# Patient Record
Sex: Female | Born: 1975 | Race: White | Hispanic: No | Marital: Single | Smoking: Never smoker
Health system: Southern US, Community
[De-identification: ages and names within clinical notes are randomized; demographics above are authoritative.]

## PROBLEM LIST (undated history)

## (undated) DIAGNOSIS — C50912 Malignant neoplasm of unspecified site of left female breast: Secondary | ICD-10-CM

## (undated) DIAGNOSIS — Z973 Presence of spectacles and contact lenses: Secondary | ICD-10-CM

## (undated) DIAGNOSIS — R232 Flushing: Secondary | ICD-10-CM

## (undated) DIAGNOSIS — K2970 Gastritis, unspecified, without bleeding: Secondary | ICD-10-CM

## (undated) DIAGNOSIS — Z8041 Family history of malignant neoplasm of ovary: Secondary | ICD-10-CM

## (undated) DIAGNOSIS — Z9221 Personal history of antineoplastic chemotherapy: Secondary | ICD-10-CM

## (undated) DIAGNOSIS — N809 Endometriosis, unspecified: Secondary | ICD-10-CM

## (undated) DIAGNOSIS — R112 Nausea with vomiting, unspecified: Secondary | ICD-10-CM

## (undated) DIAGNOSIS — T7840XA Allergy, unspecified, initial encounter: Secondary | ICD-10-CM

## (undated) DIAGNOSIS — Z800 Family history of malignant neoplasm of digestive organs: Secondary | ICD-10-CM

## (undated) DIAGNOSIS — Z8042 Family history of malignant neoplasm of prostate: Secondary | ICD-10-CM

## (undated) DIAGNOSIS — F32A Depression, unspecified: Secondary | ICD-10-CM

## (undated) DIAGNOSIS — Z803 Family history of malignant neoplasm of breast: Secondary | ICD-10-CM

## (undated) DIAGNOSIS — F329 Major depressive disorder, single episode, unspecified: Secondary | ICD-10-CM

## (undated) DIAGNOSIS — F419 Anxiety disorder, unspecified: Secondary | ICD-10-CM

## (undated) DIAGNOSIS — G43909 Migraine, unspecified, not intractable, without status migrainosus: Secondary | ICD-10-CM

## (undated) DIAGNOSIS — Z9889 Other specified postprocedural states: Secondary | ICD-10-CM

## (undated) HISTORY — PX: OVARIAN CYST REMOVAL: SHX89

## (undated) HISTORY — DX: Family history of malignant neoplasm of breast: Z80.3

## (undated) HISTORY — DX: Gastritis, unspecified, without bleeding: K29.70

## (undated) HISTORY — PX: NASAL POLYP EXCISION: SHX2068

## (undated) HISTORY — DX: Family history of malignant neoplasm of digestive organs: Z80.0

## (undated) HISTORY — PX: WISDOM TOOTH EXTRACTION: SHX21

## (undated) HISTORY — DX: Family history of malignant neoplasm of ovary: Z80.41

## (undated) HISTORY — DX: Family history of malignant neoplasm of prostate: Z80.42

## (undated) HISTORY — PX: COLONOSCOPY: SHX174

## (undated) HISTORY — DX: Allergy, unspecified, initial encounter: T78.40XA

---
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
## 2017-12-23 DIAGNOSIS — C50912 Malignant neoplasm of unspecified site of left female breast: Secondary | ICD-10-CM

## 2017-12-23 HISTORY — DX: Malignant neoplasm of unspecified site of left female breast: C50.912

## 2018-01-01 ENCOUNTER — Other Ambulatory Visit: Payer: Self-pay | Admitting: Physician Assistant

## 2018-01-01 DIAGNOSIS — N63 Unspecified lump in unspecified breast: Secondary | ICD-10-CM

## 2018-01-02 ENCOUNTER — Ambulatory Visit
Admission: RE | Admit: 2018-01-02 | Discharge: 2018-01-02 | Disposition: A | Discharge status: Home / Self Care | Payer: Commercial Managed Care - PPO | Source: Ambulatory Visit | Attending: Physician Assistant | Admitting: Physician Assistant

## 2018-01-02 DIAGNOSIS — N63 Unspecified lump in unspecified breast: Secondary | ICD-10-CM

## 2018-01-03 ENCOUNTER — Telehealth: Payer: Self-pay | Admitting: *Deleted

## 2018-01-03 NOTE — Telephone Encounter (Signed)
Left vm for pt to return call to discuss appt with Dr. Lindi Adie on 01/04/18 at 9:15. Contact information provided.

## 2018-01-03 NOTE — Telephone Encounter (Signed)
Confirmed appt with Dr. Lindi Adie on 01/04/18 at 9:15.

## 2018-01-04 ENCOUNTER — Other Ambulatory Visit: Payer: Self-pay | Admitting: *Deleted

## 2018-01-04 ENCOUNTER — Inpatient Hospital Stay: Payer: Commercial Managed Care - PPO | Attending: Hematology and Oncology | Admitting: Hematology and Oncology

## 2018-01-04 ENCOUNTER — Encounter: Payer: Self-pay | Admitting: *Deleted

## 2018-01-04 DIAGNOSIS — Z170 Estrogen receptor positive status [ER+]: Secondary | ICD-10-CM | POA: Diagnosis not present

## 2018-01-04 DIAGNOSIS — C50412 Malignant neoplasm of upper-outer quadrant of left female breast: Secondary | ICD-10-CM | POA: Insufficient documentation

## 2018-01-04 MED ORDER — LORAZEPAM 0.5 MG PO TABS: 0.5000 mg | ORAL_TABLET | Freq: Three times a day (TID) | ORAL | 1 refills | Status: DC | PRN

## 2018-01-04 NOTE — Assessment & Plan Note (Signed)
01/02/2018: Palpable lump left breast since December 2018, mammogram: Left breast UOQ 2.3 cm mass, axilla has several enlarged lymph nodes; 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  Pathology and radiology counseling: Discussed with the patient, the details of pathology including the type of breast cancer,the clinical staging, the significance of ER, PR and HER-2/neu receptors and the implications for treatment. After reviewing the pathology in detail, we proceeded to discuss the different treatment options between surgery, radiation, chemotherapy, antiestrogen therapies.  Recommendation: Neoadjuvant chemotherapy Followed by surgery with AXLND versus TAD Followed by radiation Followed by antiestrogen therapy  Awaiting on the HER-2 results before finalizing the treatment plan. Chemotherapy could be either AC followed by Taxol versus TCHP. Plan: 1.  Breast MRI  2. CTCAP and bone scan 3.  Echocardiogram 4.  Chemo class 5.  Port placement   Start chemotherapy on 01/16/2018

## 2018-01-04 NOTE — Progress Notes (Signed)
[facility] CONSULT NOTE  Patient Care Team: Blair Heys, PA-C as PCP - General (Physician Assistant)  CHIEF COMPLAINTS/PURPOSE OF CONSULTATION:  Newly diagnosed breast cancer  HISTORY OF PRESENTING ILLNESS:  Kristi [name] 42 y.o. female is here because of recent diagnosis of left breast cancer.  Patient felt a lump in the left breast on December 2018.  It was felt to be benign.  She continued to notice a slow increase in size and recently it started increasing much more significantly.  She then [facility] which revealed 2.3 cm mass in the left breast.  There were several axillary lymph nodes noted in 1 of the lymph muscles biopsy-proven to be positive for invasive ductal carcinoma.  The primary tumor in the lymph nodes were ER PR positive HER-2 is equivocal FISH is pending.  Ki-67 60%.  She was sent to [place] for discussion regarding treatment options. Patient is premenopausal.  She has had problems with heavy bleeding previously.  She had been on birth control pills.  She was also on Lupron at one point.  I reviewed her records extensively and collaborated the history with the patient.  SUMMARY OF ONCOLOGIC HISTORY:   Malignant neoplasm of upper-outer quadrant of left breast in female, estrogen receptor positive ([facility])   01/02/2018 Initial Diagnosis    Palpable lump left breast, mammogram: Left breast UOQ 2.3 cm mass, axilla has several enlarged lymph nodes; 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      01/04/2018 Cancer Staging    Staging form: Breast, AJCC 8th Edition - Clinical: Stage IIB (cT2, cN1, cM0, G3, ER+, PR+, HER2: Equivocal) - Signed by Nicholas Lose, MD on 01/04/2018        MEDICAL HISTORY:  No prior health issues SURGICAL HISTORY: No prior surgeries SOCIAL HISTORY: Denies any tobacco or alcohol or recreational drug use.  She  works as an Solicitor at [employer]. FAMILY HISTORY: Father prostate cancer, ovarian cancer in her aunt on mother side, pancreatic cancer and another aunt with breast cancer  ALLERGIES:  has no allergies on file.  MEDICATIONS: Does not take any medications REVIEW OF SYSTEMS:   Constitutional: Denies fevers, chills or abnormal night sweats Eyes: Denies blurriness of vision, double vision or watery eyes Ears, nose, mouth, throat, and face: Denies mucositis or sore throat Respiratory: Denies cough, dyspnea or wheezes Cardiovascular: Denies palpitation, chest discomfort or lower extremity swelling Gastrointestinal:  Denies nausea, heartburn or change in bowel habits Skin: Denies abnormal skin rashes Lymphatics: Denies new lymphadenopathy or easy bruising Neurological:Denies numbness, tingling or new weaknesses Behavioral/Psych: Mood is stable, no new changes  Breast:  Palpable lump in the left breast with tenderness from recent biopsy All other systems were reviewed with the patient and are negative.  PHYSICAL EXAMINATION: ECOG PERFORMANCE STATUS: 1 - Symptomatic but completely ambulatory  Vitals:   01/04/18 0859  BP: (!) 142/88  Pulse: (!) 108  Resp: 20  Temp: 98.3 F (36.8 C)  SpO2: 100%   Filed Weights   01/04/18 0859  Weight: 148 lb 8 oz (67.4 kg)    GENERAL:alert, no distress and comfortable SKIN: skin color, texture, turgor are normal, no rashes or significant lesions EYES: normal, conjunctiva are pink and non-injected, sclera clear OROPHARYNX:no exudate, no erythema and lips, buccal mucosa, and tongue normal  NECK: supple, thyroid normal size, non-tender, without nodularity LYMPH:  no palpable lymphadenopathy in the cervical, axillary or inguinal LUNGS: clear to auscultation and percussion with normal breathing effort HEART: regular rate & rhythm and no murmurs and no lower extremity edema ABDOMEN:abdomen soft, non-tender and normal bowel  sounds Musculoskeletal:no cyanosis of digits and no clubbing  PSYCH: alert & oriented x 3 with fluent speech NEURO: no focal motor/sensory deficits BREAST bruising related to recent biopsy.  Patient feels that it is very tender to touch. No palpable axillary or supraclavicular lymphadenopathy (exam performed in the presence of a chaperone)   RADIOGRAPHIC STUDIES: I have personally reviewed the radiological reports and agreed with the findings in the report.  ASSESSMENT AND PLAN:  Malignant neoplasm of upper-outer quadrant of left breast in female, estrogen receptor positive ([place]) 01/02/2018: Palpable lump left breast since December 2018, mammogram: Left breast UOQ 2.3 cm mass, axilla has several enlarged lymph nodes; 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  Pathology and radiology counseling: Discussed with the patient, the details of pathology including the type of breast cancer,the clinical staging, the significance of ER, PR and HER-2/neu receptors and the implications for treatment. After reviewing the pathology in detail, we proceeded to discuss the different treatment options between surgery, radiation, chemotherapy, antiestrogen therapies.  Recommendation: Neoadjuvant chemotherapy Followed by surgery with AXLND versus TAD Followed by radiation Followed by antiestrogen therapy  Awaiting on the HER-2 results before finalizing the treatment plan. Chemotherapy could be either AC followed by Taxol versus TCHP. Plan: 1.  Breast MRI  2. CTCAP and bone scan 3.  Echocardiogram 4.  Chemo class 5.  Port placement  6.  Genetics consultation   Start chemotherapy on 01/16/2018  All questions were answered. The patient knows to call the clinic with any problems, questions or concerns.    Harriette Ohara, MD 01/04/18

## 2018-01-05 ENCOUNTER — Other Ambulatory Visit: Payer: Self-pay | Admitting: *Deleted

## 2018-01-05 DIAGNOSIS — Z170 Estrogen receptor positive status [ER+]: Secondary | ICD-10-CM

## 2018-01-05 DIAGNOSIS — C50412 Malignant neoplasm of upper-outer quadrant of left female breast: Secondary | ICD-10-CM

## 2018-01-08 ENCOUNTER — Other Ambulatory Visit: Payer: Self-pay | Admitting: Hematology and Oncology

## 2018-01-08 ENCOUNTER — Encounter (HOSPITAL_BASED_OUTPATIENT_CLINIC_OR_DEPARTMENT_OTHER): Payer: Self-pay | Admitting: *Deleted

## 2018-01-08 ENCOUNTER — Other Ambulatory Visit: Payer: Self-pay | Admitting: General Surgery

## 2018-01-08 ENCOUNTER — Other Ambulatory Visit: Payer: Self-pay

## 2018-01-08 NOTE — Pre-Procedure Instructions (Signed)
To come to pick up Ensure pre-surgery drink 10 oz. - to drink by 0600 DOS.

## 2018-01-09 ENCOUNTER — Encounter: Payer: Self-pay | Admitting: Genetic Counselor

## 2018-01-09 ENCOUNTER — Inpatient Hospital Stay: Payer: Commercial Managed Care - PPO

## 2018-01-09 ENCOUNTER — Encounter: Payer: Self-pay | Admitting: *Deleted

## 2018-01-09 ENCOUNTER — Inpatient Hospital Stay (HOSPITAL_BASED_OUTPATIENT_CLINIC_OR_DEPARTMENT_OTHER): Payer: Commercial Managed Care - PPO | Admitting: Genetic Counselor

## 2018-01-09 ENCOUNTER — Ambulatory Visit (HOSPITAL_COMMUNITY)
Admission: RE | Admit: 2018-01-09 | Discharge: 2018-01-09 | Disposition: A | Discharge status: Home / Self Care | Payer: Commercial Managed Care - PPO | Source: Ambulatory Visit | Attending: Hematology and Oncology | Admitting: Hematology and Oncology

## 2018-01-09 DIAGNOSIS — Z8041 Family history of malignant neoplasm of ovary: Secondary | ICD-10-CM | POA: Insufficient documentation

## 2018-01-09 DIAGNOSIS — Z170 Estrogen receptor positive status [ER+]: Secondary | ICD-10-CM

## 2018-01-09 DIAGNOSIS — Z800 Family history of malignant neoplasm of digestive organs: Secondary | ICD-10-CM | POA: Diagnosis not present

## 2018-01-09 DIAGNOSIS — C50412 Malignant neoplasm of upper-outer quadrant of left female breast: Secondary | ICD-10-CM | POA: Diagnosis not present

## 2018-01-09 DIAGNOSIS — Z803 Family history of malignant neoplasm of breast: Secondary | ICD-10-CM | POA: Insufficient documentation

## 2018-01-09 DIAGNOSIS — Z8042 Family history of malignant neoplasm of prostate: Secondary | ICD-10-CM | POA: Insufficient documentation

## 2018-01-09 LAB — CMP (CANCER CENTER ONLY)
ALK PHOS: 50 U/L (ref 40–150)
ALT: 11 U/L (ref 0–55)
AST: 14 U/L (ref 5–34)
Albumin: 4.2 g/dL (ref 3.5–5.0)
Anion gap: 10 (ref 3–11)
BUN: 10 mg/dL (ref 7–26)
CALCIUM: 9.3 mg/dL (ref 8.4–10.4)
CHLORIDE: 105 mmol/L (ref 98–109)
CO2: 25 mmol/L (ref 22–29)
CREATININE: 0.81 mg/dL (ref 0.60–1.10)
GFR, Est AFR Am: >60 mL/min (ref >60)
GFR, Estimated: >60 mL/min (ref >60)
Glucose, Bld: 82 mg/dL (ref 70–140)
Potassium: 4.1 mmol/L (ref 3.5–5.1)
SODIUM: 140 mmol/L (ref 136–145)
Total Bilirubin: 0.4 mg/dL (ref 0.2–1.2)
Total Protein: 7.5 g/dL (ref 6.4–8.3)

## 2018-01-09 LAB — CBC WITH DIFFERENTIAL (CANCER CENTER ONLY)
BASOS PCT: 0 %
Basophils Absolute: 0 10*3/uL (ref 0.0–0.1)
EOS PCT: 1 %
Eosinophils Absolute: 0.1 10*3/uL (ref 0.0–0.5)
HCT: 40.8 % (ref 34.8–46.6)
Hemoglobin: 13.5 g/dL (ref 11.6–15.9)
LYMPHS ABS: 1.3 10*3/uL (ref 0.9–3.3)
Lymphocytes Relative: 16 %
MCH: 29.7 pg (ref 25.1–34.0)
MCHC: 33.1 g/dL (ref 31.5–36.0)
MCV: 89.7 fL (ref 79.5–101.0)
MONO ABS: 0.2 10*3/uL (ref 0.1–0.9)
MONOS PCT: 3 %
Neutro Abs: 6.5 10*3/uL (ref 1.5–6.5)
Neutrophils Relative %: 80 %
PLATELETS: 402 10*3/uL — AB (ref 145–400)
RBC: 4.55 MIL/uL (ref 3.70–5.45)
RDW: 13.1 % (ref 11.2–14.5)
WBC Count: 8 10*3/uL (ref 3.9–10.3)

## 2018-01-09 NOTE — Progress Notes (Signed)
Pt here to pick up Ensure drink for DOS 01/15/18 Instructions review, verbalized understanding. To finish drink by 0931 DOS

## 2018-01-09 NOTE — Progress Notes (Signed)
  Echocardiogram 2D Echocardiogram has been performed.  Shamariah Shewmake T Aisea Bouldin 01/09/2018, 11:40 AM

## 2018-01-09 NOTE — Progress Notes (Signed)
REFERRING PROVIDER: Rolm Bookbinder, MD [address], [place] [address]  PRIMARY PROVIDER:  Blair Heys, PA-C  PRIMARY REASON FOR VISIT:  1. Malignant neoplasm of upper-outer quadrant of left breast in female, estrogen receptor positive ([facility])   2. Family history of breast cancer   3. Family history of prostate cancer   4. Family history of ovarian cancer   5. Family history of pancreatic cancer      HISTORY OF PRESENT ILLNESS:   Kristi [name], a 42 y.o. female, was seen for a [facility] cancer genetics consultation at the request of Dr. Donne Hazel due to a personal and family history of cancer.  Kristi [name] presents to clinic today to discuss the possibility of a hereditary predisposition to cancer, genetic testing, and to further clarify her future cancer risks, as well as potential cancer risks for family members.   In June 2019, at the age of 12, Kristi [name] was diagnosed with invasive ductal carcinoma of the left breast.  The tumor is ER+/PR+/Her2 equivocal. This will be treated with chemotherapy, surgery and radiation.      CANCER HISTORY:    Malignant neoplasm of upper-outer quadrant of left breast in female, estrogen receptor positive ([facility])   01/02/2018 Initial Diagnosis    Palpable lump left breast, mammogram: Left breast UOQ 2.3 cm mass, axilla has several enlarged lymph nodes; 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      01/04/2018 Cancer Staging    Staging form: Breast, AJCC 8th Edition - Clinical: Stage IIB (cT2, cN1, cM0, G3, ER+, PR+, HER2: Equivocal) - Signed by Nicholas Lose, MD on 01/04/2018      01/15/2018 -  Chemotherapy    The patient had DOXOrubicin (ADRIAMYCIN) chemo injection 106 mg, 60 mg/m2 = 106 mg, Intravenous,  Once, 0 of 4 cycles palonosetron (ALOXI) injection 0.25 mg, 0.25 mg, Intravenous,  Once, 0 of 4  cycles pegfilgrastim-cbqv (UDENYCA) injection 6 mg, 6 mg, Subcutaneous, Once, 0 of 4 cycles cyclophosphamide (CYTOXAN) 1,060 mg in sodium chloride 0.9 % 250 mL chemo infusion, 600 mg/m2 = 1,060 mg, Intravenous,  Once, 0 of 4 cycles PACLitaxel (TAXOL) 138 mg in sodium chloride 0.9 % 250 mL chemo infusion (</= '80mg'$ /m2), 80 mg/m2 = 138 mg, Intravenous,  Once, 0 of 12 cycles fosaprepitant (EMEND) 150 mg, dexamethasone (DECADRON) 12 mg in sodium chloride 0.9 % 145 mL IVPB, , Intravenous,  Once, 0 of 4 cycles  for chemotherapy treatment.         HORMONAL RISK FACTORS:  Menarche was at age 32.  First live birth at age N/A.  OCP use for approximately 13 years.  Ovaries intact: yes.  Hysterectomy: no.  Menopausal status: premenopausal.  HRT use: 0 years. Colonoscopy: yes; normal. Mammogram within the last year: yes. Number of breast biopsies: 1. Up to date with pelvic exams:  yes. Any excessive radiation exposure in the past:  no  Past Medical History:  Diagnosis Date  . Breast cancer, left ([facility]) 12/2017  . Dental crowns present   . Endometriosis   . Family history of breast cancer   . Family history of ovarian cancer   . Family history of pancreatic cancer   . Family history of prostate cancer   . Migraines     Past Surgical History:  Procedure Laterality Date  . OVARIAN CYST REMOVAL      Social History  Socioeconomic History  . Marital status: Single    Spouse name: Not on file  . Number of children: Not on file  . Years of education: Not on file  . Highest education level: Not on file  Occupational History  . Not on file  Social Needs  . Financial resource strain: Not on file  . Food insecurity:    Worry: Not on file    Inability: Not on file  . Transportation needs:    Medical: Not on file    Non-medical: Not on file  Tobacco Use  . Smoking status: Never Smoker  . Smokeless tobacco: Never Used  Substance and Sexual Activity  . Alcohol use: Yes    Comment:  occasionally  . Drug use: Never  . Sexual activity: Not on file  Lifestyle  . Physical activity:    Days per week: Not on file    Minutes per session: Not on file  . Stress: Not on file  Relationships  . Social connections:    Talks on phone: Not on file    Gets together: Not on file    Attends religious service: Not on file    Active member of club or organization: Not on file    Attends meetings of clubs or organizations: Not on file    Relationship status: Not on file  Other Topics Concern  . Not on file  Social History Narrative  . Not on file     FAMILY HISTORY:  We obtained a detailed, 4-generation family history.  Significant diagnoses are listed below: Family History  Problem Relation Age of Onset  . Prostate cancer Father 100       Stage 1  . Ovarian cancer Maternal Aunt 19       ? Germ cell?  . Prostate cancer Maternal Uncle 65       prostectomy  . Pancreatic cancer Maternal Grandmother 58  . Colon cancer Maternal Grandfather 31  . Heart attack Paternal Grandmother   . Prostate cancer Paternal Grandfather        dx in his 63s, d. 37s-90s  . Breast cancer Other        MGMs sister dx over 86    The patient does not have children.  She has three sisters and a brother who are all younger and cancer free.  Her parents are both living in their 56's.  The patient's father was diagnosed with Stage 1 prostate cancer at 71.  He has three sisters and a brother who are call cancer free. His father also had prostate cancer and died in his 69's-90's.  His mother died from heart failure.  The patient's mother does not have cancer.  She has two sisters and three brothers.  One sister had ovarian cancer at 70-75 years of age.  One brother was recently diagnosed with prostate cancer and underwent a prostectomy at age 41.  The maternal grandfather died of colon cancer at 25 and the grandmother was diagnosed early with pancreatic cancer at 60 and is now 28.  She had a sister with  breast cancer dx over age 38.  Kristi [name] is unaware of previous family history of genetic testing for hereditary cancer risks. Patient's maternal ancestors are of [place] descent, and paternal ancestors are of [place] and [place] descent. There is no reported Ashkenazi Jewish ancestry. There is no known consanguinity.  GENETIC COUNSELING ASSESSMENT: Kristi [name] is a 42 y.o. female with a personal and family history of breast cancer  and family history of prostate, pancreatic and ovarian cancer which is somewhat suggestive of a hereditary cancer syndrome and predisposition to cancer. We, therefore, discussed and recommended the following at today's visit.   DISCUSSION: We discussed that about 5-10% of breast cancer is hereditary with most cases due to BRCA mutations.  These are high risk genes associated with up to an 87% lifetime risk for breast cancer.  There are several other genes seen commonly outside of BRCA mutations.  These include ATM, CHEK2 and PALB2.  These are moderate risk genes associated with between a 30-50% risk for breast cancer.  We discussed that genetic testing helps identify those individuals in which we need to put in place preventive measures for a future cancer, as well as allows [place] to determine relatives who are at risk and need preventive measures put in place as well.    We reviewed the characteristics, features and inheritance patterns of hereditary cancer syndromes. We also discussed genetic testing, including the appropriate family members to test, the process of testing, insurance coverage and turn-around-time for results. We discussed the implications of a negative, positive and/or variant of uncertain significant result. We recommended Kristi [name] pursue genetic testing for the multi-cancer gene panel. The Multi-Gene Panel offered by [facility] includes sequencing and/or deletion duplication testing of the following 83 genes: ALK, APC, ATM, AXIN2,BAP1,  BARD1, BLM,  BMPR1A, BRCA1, BRCA2, BRIP1, CASR, CDC73, CDH1, CDK4, CDKN1B, CDKN1C, CDKN2A (p14ARF), CDKN2A (p16INK4a), CEBPA, CHEK2, CTNNA1, DICER1, DIS3L2, EGFR (c.2369C>T, p.Thr790Met variant only), EPCAM (Deletion/duplication testing only), FH, FLCN, GATA2, GPC3, GREM1 (Promoter region deletion/duplication testing only), HOXB13 (c.251G>A, p.Gly84Glu), HRAS, KIT, MAX, MEN1, MET, MITF (c.952G>A, p.Glu318Lys variant only), MLH1, MSH2, MSH3, MSH6, MUTYH, NBN, NF1, NF2, NTHL1, PALB2, PDGFRA, PHOX2B, PMS2, POLD1, POLE, POT1, PRKAR1A, PTCH1, PTEN, RAD50, RAD51C, RAD51D, RB1, RECQL4, RET, RUNX1, SDHAF2, SDHA (sequence changes only), SDHB, SDHC, SDHD, SMAD4, SMARCA4, SMARCB1, SMARCE1, STK11, SUFU, TERT, TERT, TMEM127, TP53, TSC1, TSC2, VHL, WRN and WT1.    Based on Kristi [name] personal and family history of cancer, she meets medical criteria for genetic testing. Despite that she meets criteria, she may still have an out of pocket cost. We discussed that if her out of pocket cost for testing is over $100, the laboratory will call and confirm whether she wants to proceed with testing.  If the out of pocket cost of testing is less than $100 she will be billed by the genetic testing laboratory.   We discussed that some people do not want to undergo genetic testing due to fear of genetic discrimination.  A federal law called the Genetic Information Non-Discrimination Act (GINA) of 2008 helps protect individuals against genetic discrimination based on their genetic test results.  It impacts both health insurance and employment.  With health insurance, it protects against increased premiums, being kicked off insurance or being forced to take a test in order to be insured.  For employment it protects against hiring, firing and promoting decisions based on genetic test results.  Health status due to a cancer diagnosis is not protected under GINA.   PLAN: After considering the risks, benefits, and limitations, Kristi [name]  provided  informed consent to pursue genetic testing and the blood sample was sent to [facility] for analysis of the Multi-cancer gene panel. Results should be available within approximately 2-3 weeks' time, at which point they will be disclosed by telephone to Kristi [name], as will any additional recommendations warranted by these results. Kristi [name] will receive a summary  of her genetic counseling visit and a copy of her results once available. This information will also be available in Epic. We encouraged Kristi [name] to remain in contact with cancer genetics annually so that we can continuously update the family history and inform her of any changes in cancer genetics and testing that may be of benefit for her family. Kristi [name]'s questions were answered to her satisfaction today. Our contact information was provided should additional questions or concerns arise.  Lastly, we encouraged Kristi [name] to remain in contact with cancer genetics annually so that we can continuously update the family history and inform her of any changes in cancer genetics and testing that may be of benefit for this family.   Ms.  [name]'s questions were answered to her satisfaction today. Our contact information was provided should additional questions or concerns arise. Thank you for the referral and allowing [place] to share in the care of your patient.   Karen P. Florene Glen, [place], [facility] Certified Genetic Counselor [email or web address] phone: [phone]  The patient was seen for a total of 45 minutes in face-to-face genetic counseling.  This patient was discussed with Drs. Magrinat, Lindi Adie and/or Burr Medico who agrees with the above.    _______________________________________________________________________ For Office Staff:  Number of people involved in session: 1 Was an Intern/ student involved with case: no

## 2018-01-10 ENCOUNTER — Ambulatory Visit (HOSPITAL_COMMUNITY)
Admission: RE | Admit: 2018-01-10 | Discharge: 2018-01-10 | Disposition: A | Discharge status: Home / Self Care | Payer: Commercial Managed Care - PPO | Source: Ambulatory Visit | Attending: Hematology and Oncology | Admitting: Hematology and Oncology

## 2018-01-10 DIAGNOSIS — C50412 Malignant neoplasm of upper-outer quadrant of left female breast: Secondary | ICD-10-CM | POA: Insufficient documentation

## 2018-01-10 DIAGNOSIS — Z170 Estrogen receptor positive status [ER+]: Secondary | ICD-10-CM | POA: Diagnosis present

## 2018-01-10 MED ORDER — GADOBENATE DIMEGLUMINE 529 MG/ML IV SOLN
15.0000 mL | Freq: Once | INTRAVENOUS | Status: AC | PRN
  Administered 2018-01-10: 14 mL via INTRAVENOUS

## 2018-01-11 ENCOUNTER — Telehealth: Payer: Self-pay | Admitting: Hematology and Oncology

## 2018-01-11 NOTE — Telephone Encounter (Signed)
Spoke to patient regarding upcoming June through august appointments per 6/14 sch message

## 2018-01-11 NOTE — Telephone Encounter (Signed)
Patient is scheduled 7/9 in new patient slot due to avail in treatment area. [place] per nurse.

## 2018-01-12 ENCOUNTER — Encounter (HOSPITAL_COMMUNITY)
Admission: RE | Admit: 2018-01-12 | Discharge: 2018-01-12 | Disposition: A | Discharge status: Home / Self Care | Payer: Commercial Managed Care - PPO | Source: Ambulatory Visit | Attending: Hematology and Oncology | Admitting: Hematology and Oncology

## 2018-01-12 ENCOUNTER — Ambulatory Visit (HOSPITAL_COMMUNITY)
Admission: RE | Admit: 2018-01-12 | Discharge: 2018-01-12 | Disposition: A | Discharge status: Home / Self Care | Payer: Commercial Managed Care - PPO | Source: Ambulatory Visit | Attending: Hematology and Oncology | Admitting: Hematology and Oncology

## 2018-01-12 ENCOUNTER — Inpatient Hospital Stay: Payer: Commercial Managed Care - PPO

## 2018-01-12 DIAGNOSIS — C50412 Malignant neoplasm of upper-outer quadrant of left female breast: Secondary | ICD-10-CM

## 2018-01-12 DIAGNOSIS — Z170 Estrogen receptor positive status [ER+]: Secondary | ICD-10-CM | POA: Diagnosis present

## 2018-01-12 MED ORDER — IOPAMIDOL (ISOVUE-300) INJECTION 61%
100.0000 mL | Freq: Once | INTRAVENOUS | Status: AC | PRN
  Administered 2018-01-12: 100 mL via INTRAVENOUS

## 2018-01-12 MED ORDER — TECHNETIUM TC 99M MEDRONATE IV KIT
22.0000 | PACK | Freq: Once | INTRAVENOUS | Status: AC
  Administered 2018-01-12: 22 via INTRAVENOUS

## 2018-01-12 MED ORDER — IOPAMIDOL (ISOVUE-300) INJECTION 61%
INTRAVENOUS | Status: AC
  Filled 2018-01-12: qty 100

## 2018-01-15 ENCOUNTER — Ambulatory Visit (HOSPITAL_COMMUNITY): Payer: Commercial Managed Care - PPO

## 2018-01-15 ENCOUNTER — Encounter (HOSPITAL_BASED_OUTPATIENT_CLINIC_OR_DEPARTMENT_OTHER)
Admission: RE | Disposition: A | Discharge status: Home / Self Care | Payer: Self-pay | Source: Ambulatory Visit | Attending: General Surgery

## 2018-01-15 ENCOUNTER — Other Ambulatory Visit: Payer: Commercial Managed Care - PPO

## 2018-01-15 ENCOUNTER — Other Ambulatory Visit: Payer: Self-pay

## 2018-01-15 ENCOUNTER — Ambulatory Visit (HOSPITAL_BASED_OUTPATIENT_CLINIC_OR_DEPARTMENT_OTHER): Payer: Commercial Managed Care - PPO | Admitting: Anesthesiology

## 2018-01-15 ENCOUNTER — Telehealth: Payer: Self-pay | Admitting: *Deleted

## 2018-01-15 ENCOUNTER — Encounter (HOSPITAL_BASED_OUTPATIENT_CLINIC_OR_DEPARTMENT_OTHER): Payer: Self-pay | Admitting: Anesthesiology

## 2018-01-15 ENCOUNTER — Other Ambulatory Visit (HOSPITAL_COMMUNITY): Payer: Commercial Managed Care - PPO

## 2018-01-15 ENCOUNTER — Ambulatory Visit (HOSPITAL_BASED_OUTPATIENT_CLINIC_OR_DEPARTMENT_OTHER)
Admission: RE | Admit: 2018-01-15 | Discharge: 2018-01-15 | Disposition: A | Discharge status: Home / Self Care | Payer: Commercial Managed Care - PPO | Source: Ambulatory Visit | Attending: General Surgery | Admitting: General Surgery

## 2018-01-15 ENCOUNTER — Other Ambulatory Visit: Payer: Self-pay | Admitting: Hematology and Oncology

## 2018-01-15 DIAGNOSIS — Z95828 Presence of other vascular implants and grafts: Secondary | ICD-10-CM

## 2018-01-15 DIAGNOSIS — Z170 Estrogen receptor positive status [ER+]: Secondary | ICD-10-CM

## 2018-01-15 DIAGNOSIS — C50412 Malignant neoplasm of upper-outer quadrant of left female breast: Secondary | ICD-10-CM | POA: Diagnosis present

## 2018-01-15 DIAGNOSIS — Z419 Encounter for procedure for purposes other than remedying health state, unspecified: Secondary | ICD-10-CM

## 2018-01-15 HISTORY — PX: PORTACATH PLACEMENT: SHX2246

## 2018-01-15 HISTORY — DX: Endometriosis, unspecified: N80.9

## 2018-01-15 HISTORY — DX: Migraine, unspecified, not intractable, without status migrainosus: G43.909

## 2018-01-15 HISTORY — DX: Malignant neoplasm of unspecified site of left female breast: C50.912

## 2018-01-15 SURGERY — INSERTION, TUNNELED CENTRAL VENOUS DEVICE, WITH PORT
Anesthesia: General | Site: Neck | Laterality: Right

## 2018-01-15 MED ORDER — GABAPENTIN 100 MG PO CAPS
ORAL_CAPSULE | ORAL | Status: AC
  Filled 2018-01-15: qty 1

## 2018-01-15 MED ORDER — PROCHLORPERAZINE MALEATE 10 MG PO TABS: 10.0000 mg | ORAL_TABLET | Freq: Four times a day (QID) | ORAL | 1 refills | Status: DC | PRN

## 2018-01-15 MED ORDER — GABAPENTIN 100 MG PO CAPS
100.0000 mg | ORAL_CAPSULE | ORAL | Status: AC
  Administered 2018-01-15: 100 mg via ORAL

## 2018-01-15 MED ORDER — MIDAZOLAM HCL 2 MG/2ML IJ SOLN
1.0000 mg | INTRAMUSCULAR | Status: DC | PRN
  Administered 2018-01-15: 2 mg via INTRAVENOUS

## 2018-01-15 MED ORDER — CEFAZOLIN SODIUM-DEXTROSE 2-4 GM/100ML-% IV SOLN
INTRAVENOUS | Status: AC
  Filled 2018-01-15: qty 100

## 2018-01-15 MED ORDER — LIDOCAINE-PRILOCAINE 2.5-2.5 % EX CREA: TOPICAL_CREAM | CUTANEOUS | 3 refills | Status: DC

## 2018-01-15 MED ORDER — HYDROMORPHONE HCL 1 MG/ML IJ SOLN
INTRAMUSCULAR | Status: AC
  Filled 2018-01-15: qty 0.5

## 2018-01-15 MED ORDER — LORAZEPAM 0.5 MG PO TABS
0.5000 mg | ORAL_TABLET | Freq: Four times a day (QID) | ORAL | 0 refills | Status: DC | PRN
Start: 2018-01-15 — End: 2018-02-22

## 2018-01-15 MED ORDER — CEFAZOLIN SODIUM-DEXTROSE 2-4 GM/100ML-% IV SOLN
2.0000 g | INTRAVENOUS | Status: AC
  Administered 2018-01-15: 2 g via INTRAVENOUS

## 2018-01-15 MED ORDER — ACETAMINOPHEN 500 MG PO TABS
ORAL_TABLET | ORAL | Status: AC
  Filled 2018-01-15: qty 2

## 2018-01-15 MED ORDER — ONDANSETRON HCL 4 MG/2ML IJ SOLN: 4.0000 mg | Freq: Once | INTRAMUSCULAR | Status: DC | PRN

## 2018-01-15 MED ORDER — LACTATED RINGERS IV SOLN
INTRAVENOUS | Status: DC
  Administered 2018-01-15: 09:00:00 via INTRAVENOUS

## 2018-01-15 MED ORDER — SCOPOLAMINE 1 MG/3DAYS TD PT72: 1.0000 | MEDICATED_PATCH | Freq: Once | TRANSDERMAL | Status: DC | PRN

## 2018-01-15 MED ORDER — ONDANSETRON HCL 8 MG PO TABS: 8.0000 mg | ORAL_TABLET | Freq: Two times a day (BID) | ORAL | 1 refills | Status: DC | PRN

## 2018-01-15 MED ORDER — MIDAZOLAM HCL 2 MG/2ML IJ SOLN
INTRAMUSCULAR | Status: AC
  Filled 2018-01-15: qty 2

## 2018-01-15 MED ORDER — MEPERIDINE HCL 25 MG/ML IJ SOLN: 6.2500 mg | INTRAMUSCULAR | Status: DC | PRN

## 2018-01-15 MED ORDER — HEPARIN (PORCINE) IN NACL 2-0.9 UNITS/ML
INTRAMUSCULAR | Status: AC | PRN
  Administered 2018-01-15: 1 via INTRAVENOUS

## 2018-01-15 MED ORDER — HYDROMORPHONE HCL 1 MG/ML IJ SOLN
0.2500 mg | INTRAMUSCULAR | Status: DC | PRN
  Administered 2018-01-15 (×2): 0.25 mg via INTRAVENOUS

## 2018-01-15 MED ORDER — ONDANSETRON HCL 4 MG/2ML IJ SOLN
INTRAMUSCULAR | Status: DC | PRN
  Administered 2018-01-15: 4 mg via INTRAVENOUS

## 2018-01-15 MED ORDER — DEXAMETHASONE SODIUM PHOSPHATE 4 MG/ML IJ SOLN
INTRAMUSCULAR | Status: DC | PRN
  Administered 2018-01-15: 10 mg via INTRAVENOUS

## 2018-01-15 MED ORDER — TRAMADOL HCL 50 MG PO TABS: 50.0000 mg | ORAL_TABLET | Freq: Four times a day (QID) | ORAL | 0 refills | Status: DC | PRN

## 2018-01-15 MED ORDER — BUPIVACAINE HCL (PF) 0.25 % IJ SOLN
INTRAMUSCULAR | Status: DC | PRN
  Administered 2018-01-15: 3.5 mL

## 2018-01-15 MED ORDER — PROPOFOL 10 MG/ML IV BOLUS
INTRAVENOUS | Status: DC | PRN
  Administered 2018-01-15: 150 mg via INTRAVENOUS

## 2018-01-15 MED ORDER — FENTANYL CITRATE (PF) 100 MCG/2ML IJ SOLN
INTRAMUSCULAR | Status: AC
  Filled 2018-01-15: qty 2

## 2018-01-15 MED ORDER — FENTANYL CITRATE (PF) 100 MCG/2ML IJ SOLN
50.0000 ug | INTRAMUSCULAR | Status: DC | PRN
  Administered 2018-01-15: 100 ug via INTRAVENOUS

## 2018-01-15 MED ORDER — LIDOCAINE HCL (CARDIAC) PF 100 MG/5ML IV SOSY
PREFILLED_SYRINGE | INTRAVENOUS | Status: DC | PRN
  Administered 2018-01-15: 100 mg via INTRAVENOUS

## 2018-01-15 MED ORDER — DEXAMETHASONE 4 MG PO TABS: ORAL_TABLET | ORAL | 1 refills | Status: DC

## 2018-01-15 MED ORDER — ACETAMINOPHEN 500 MG PO TABS
1000.0000 mg | ORAL_TABLET | ORAL | Status: AC
  Administered 2018-01-15: 1000 mg via ORAL

## 2018-01-15 MED ORDER — ENSURE PRE-SURGERY PO LIQD: 296.0000 mL | Freq: Once | ORAL | Status: DC

## 2018-01-15 MED ORDER — HEPARIN SOD (PORK) LOCK FLUSH 100 UNIT/ML IV SOLN
INTRAVENOUS | Status: DC | PRN
  Administered 2018-01-15: 500 [IU] via INTRAVENOUS

## 2018-01-15 SURGICAL SUPPLY — 53 items
BAG DECANTER FOR FLEXI CONT (MISCELLANEOUS) ×2 IMPLANT
BENZOIN TINCTURE PRP APPL 2/3 (GAUZE/BANDAGES/DRESSINGS) ×2 IMPLANT
BLADE SURG 11 STRL SS (BLADE) ×2 IMPLANT
BLADE SURG 15 STRL LF DISP TIS (BLADE) ×1 IMPLANT
BLADE SURG 15 STRL SS (BLADE) ×1
CANISTER SUCT 1200ML W/VALVE (MISCELLANEOUS) IMPLANT
CHLORAPREP W/TINT 26ML (MISCELLANEOUS) ×2 IMPLANT
COVER BACK TABLE 60X90IN (DRAPES) ×2 IMPLANT
COVER MAYO STAND STRL (DRAPES) ×2 IMPLANT
COVER PROBE 5X48 (MISCELLANEOUS) ×1
DECANTER SPIKE VIAL GLASS SM (MISCELLANEOUS) IMPLANT
DERMABOND ADVANCED (GAUZE/BANDAGES/DRESSINGS) ×1
DERMABOND ADVANCED .7 DNX12 (GAUZE/BANDAGES/DRESSINGS) ×1 IMPLANT
DRAPE C-ARM 42X72 X-RAY (DRAPES) ×2 IMPLANT
DRAPE LAPAROSCOPIC ABDOMINAL (DRAPES) ×2 IMPLANT
DRAPE UTILITY XL STRL (DRAPES) ×2 IMPLANT
DRSG TEGADERM 4X4.75 (GAUZE/BANDAGES/DRESSINGS) IMPLANT
ELECT COATED BLADE 2.86 ST (ELECTRODE) ×2 IMPLANT
ELECT REM PT RETURN 9FT ADLT (ELECTROSURGICAL) ×2
ELECTRODE REM PT RTRN 9FT ADLT (ELECTROSURGICAL) ×1 IMPLANT
GAUZE SPONGE 4X4 12PLY STRL (GAUZE/BANDAGES/DRESSINGS) ×2 IMPLANT
GAUZE SPONGE 4X4 12PLY STRL LF (GAUZE/BANDAGES/DRESSINGS) IMPLANT
GLOVE BIO SURGEON STRL SZ 6.5 (GLOVE) ×2 IMPLANT
GLOVE BIO SURGEON STRL SZ7 (GLOVE) ×2 IMPLANT
GLOVE BIOGEL PI IND STRL 6.5 (GLOVE) ×1 IMPLANT
GLOVE BIOGEL PI IND STRL 7.5 (GLOVE) ×1 IMPLANT
GLOVE BIOGEL PI INDICATOR 6.5 (GLOVE) ×1
GLOVE BIOGEL PI INDICATOR 7.5 (GLOVE) ×1
GOWN STRL REUS W/ TWL LRG LVL3 (GOWN DISPOSABLE) ×2 IMPLANT
GOWN STRL REUS W/TWL LRG LVL3 (GOWN DISPOSABLE) ×2
IV KIT MINILOC 20X1 SAFETY (NEEDLE) IMPLANT
KIT CVR 48X5XPRB PLUP LF (MISCELLANEOUS) ×1 IMPLANT
KIT PORT POWER 8FR ISP CVUE (Port) ×2 IMPLANT
NDL SAFETY ECLIPSE 18X1.5 (NEEDLE) IMPLANT
NEEDLE HYPO 18GX1.5 SHARP (NEEDLE)
NEEDLE HYPO 25X1 1.5 SAFETY (NEEDLE) ×2 IMPLANT
PACK BASIN DAY SURGERY FS (CUSTOM PROCEDURE TRAY) ×2 IMPLANT
PENCIL BUTTON HOLSTER BLD 10FT (ELECTRODE) ×2 IMPLANT
SHEATH COOK PEEL AWAY SET 9F (SHEATH) ×2 IMPLANT
SLEEVE SCD COMPRESS KNEE MED (MISCELLANEOUS) ×2 IMPLANT
STRIP CLOSURE SKIN 1/2X4 (GAUZE/BANDAGES/DRESSINGS) ×2 IMPLANT
SUT MNCRL AB 4-0 PS2 18 (SUTURE) ×2 IMPLANT
SUT PROLENE 2 0 SH DA (SUTURE) ×2 IMPLANT
SUT SILK 2 0 TIES 17X18 (SUTURE)
SUT SILK 2-0 18XBRD TIE BLK (SUTURE) IMPLANT
SUT VIC AB 3-0 SH 27 (SUTURE) ×1
SUT VIC AB 3-0 SH 27X BRD (SUTURE) ×1 IMPLANT
SYR 5ML LUER SLIP (SYRINGE) ×2 IMPLANT
SYR CONTROL 10ML LL (SYRINGE) ×2 IMPLANT
TOWEL GREEN STERILE FF (TOWEL DISPOSABLE) ×2 IMPLANT
TOWEL OR NON WOVEN STRL DISP B (DISPOSABLE) IMPLANT
TUBE CONNECTING 20X1/4 (TUBING) IMPLANT
YANKAUER SUCT BULB TIP NO VENT (SUCTIONS) IMPLANT

## 2018-01-15 NOTE — H&P (Signed)
55 yof referred by Lorenza Evangelist for new left breast cancer. she has fh of breast cancer in maternal great aunt who was elderly and in a maternal aunt who had ovarian cancer at around 92. no testing prior. she has no dc. she felt mass in uoq in december that recently has enlarged. this was evaluated by mm. she has c density breasts. there is 22 mm mass in uoq of left breast (right is normal) on mm that on [place] measures 23x18x17 mm. there are abnormal nodes that are not quantified. US biopsy of node and tumor were done. 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 it is er/pr pos, her 2 equivocal but negative by FISH. this is grade III. she works at [employer] in [place], retail in [place]. she is here with her parents today to discuss options. her brother works for [employer] as a Hotel manager.    Past Surgical History Benjiman Core, [place]; 01/05/2018 10:29 AM) Breast Biopsy  Left. Oral Surgery   Diagnostic Studies History Benjiman Core, [place]; 01/05/2018 10:29 AM) Colonoscopy  >10 years ago Mammogram  within last year Pap Smear  1-5 years ago  Allergies Benjiman Core, CMA; 01/05/2018 10:35 AM) Bactrim *ANTI-INFECTIVE AGENTS - MISC.*  Headache.  Medication History Benjiman Core, CMA; 01/05/2018 10:40 AM) Baclofen (10MG  Tablet, Oral) Active. Wellbutrin (75MG  Tablet, Oral) Active. Emgality (120MG /ML Soln Auto-inj, Subcutaneous) Active. LORazepam (0.5MG  Tablet, Oral) Active. Magnesium (500MG  Tablet, Oral) Active. Vitamin B-12 (100MCG Tablet, Oral) Active. Rizatriptan Benzoate (10MG  Tablet, Oral) Active. Medications Reconciled  Social History Benjiman Core, CMA; 01/05/2018 10:29 AM) Alcohol use  Moderate alcohol use. Caffeine use  Coffee. No drug use  Tobacco use  Never smoker.  Family History Benjiman Core, [place]; 01/05/2018 10:29 AM) Breast Cancer  Family Members In General. Colon Cancer  Family Members In General. Malignant Neoplasm Of Pancreas  Family Members In  General. Migraine Headache  Mother. Ovarian Cancer  Family Members In General. Prostate Cancer  Family Members In General, Father.  Pregnancy / Birth History Benjiman Core, [place]; 01/05/2018 10:29 AM) Age at menarche  62 years. Contraceptive History  Oral contraceptives. Gravida  0 Irregular periods  Para  0  Other Problems (Armen Glenn, CMA; 01/05/2018 10:29 AM) Hemorrhoids  Migraine Headache     Review of Systems (Armen Glenn CMA; 01/05/2018 10:29 AM) General Not Present- Appetite Loss, Chills, Fatigue, Fever, Night Sweats, Weight Gain and Weight Loss. Skin Not Present- Change in Wart/Mole, Dryness, Hives, Jaundice, New Lesions, Non-Healing Wounds, Rash and Ulcer. HEENT Present- Seasonal Allergies. Not Present- Earache, Hearing Loss, Hoarseness, Nose Bleed, Oral Ulcers, Ringing in the Ears, Sinus Pain, Sore Throat, Visual Disturbances, Wears glasses/contact lenses and Yellow Eyes. Respiratory Not Present- Bloody sputum, Chronic Cough, Difficulty Breathing, Snoring and Wheezing. Breast Present- Breast Mass and Breast Pain. Not Present- Nipple Discharge and Skin Changes. Cardiovascular Not Present- Chest Pain, Difficulty Breathing Lying Down, Leg Cramps, Palpitations, Rapid Heart Rate, Shortness of Breath and Swelling of Extremities. Gastrointestinal Not Present- Abdominal Pain, Bloating, Bloody Stool, Change in Bowel Habits, Chronic diarrhea, Constipation, Difficulty Swallowing, Excessive gas, Gets full quickly at meals, Hemorrhoids, Indigestion, Nausea, Rectal Pain and Vomiting. Female Genitourinary Not Present- Frequency, Nocturia, Painful Urination, Pelvic Pain and Urgency. Musculoskeletal Not Present- Back Pain, Joint Pain, Joint Stiffness, Muscle Pain, Muscle Weakness and Swelling of Extremities. Neurological Present- Headaches. Not Present- Decreased Memory, Fainting, Numbness, Seizures, Tingling, Tremor, Trouble walking and Weakness. Psychiatric Not Present- Anxiety, Bipolar,  Change in Sleep Pattern, Depression, Fearful and Frequent  crying. Endocrine Not Present- Cold Intolerance, Excessive Hunger, Hair Changes, Heat Intolerance, Hot flashes and New Diabetes. Hematology Not Present- Blood Thinners, Easy Bruising, Excessive bleeding, Gland problems, HIV and Persistent Infections.  Vitals (Armen Glenn CMA; 01/05/2018 10:32 AM) 01/05/2018 10:30 AM Weight: 149 lb Height: 65in Body Surface Area: 1.75 m Body Mass Index: 24.79 kg/m  Temp.: 98.27F  Pulse: 72 (Regular)  P.OX: 97% (Room air) BP: 148/82 (Sitting, Left Arm, Standard)       Physical Exam Rolm Bookbinder MD; 01/05/2018 11:14 AM) General Mental Status-Alert.  Head and Neck Trachea-midline. Thyroid Gland Characteristics - normal size and consistency.  Eye Sclera/Conjunctiva - Bilateral-No scleral icterus.  Chest and Lung Exam Chest and lung exam reveals -quiet, even and easy respiratory effort with no use of accessory muscles and on auscultation, normal breath sounds, no adventitious sounds and normal vocal resonance.  Breast Nipples-No Discharge. Note: 2 cm uoq mass with associated hematoma, almost in axilla    Cardiovascular Cardiovascular examination reveals -normal heart sounds, regular rate and rhythm with no murmurs.  Neurologic Neurologic evaluation reveals -alert and oriented x 3 with no impairment of recent or remote memory.  Lymphatic Head & Neck  General Head & Neck Lymphatics: Bilateral - Description - Normal. 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 We discussed all of the different options for treatment for breast cancer including surgery, chemotherapy, radiation therapy, Herceptin, and antiestrogen therapy She is  indicated to get chemotherapy and I think neoadjuvant gives [place] several options including possibly downstaging nodes (will need to know how many enlarged nodes there are now) to do TAD vs ALND, assess response, give time for genetics to come back. She understands port placement and will do in about a week. we also discussed genetic mutation and how this might change recs. I discussed lumpectomy and mastectomy with her today at length. If genetics negative, this tumor amenable to lumpectomy now likely doing lump/nodal surgery through one axillary incision. we discussed mastectomy and plastic surgery reconstruction as well. we will get mri now and at end of chemotherapy and then decide surgery. I discussed with her today mri might have additional findings that would need biopsy many of which are not cancer.

## 2018-01-15 NOTE — Interval H&P Note (Signed)
History and Physical Interval Note:  01/15/2018 9:24 AM  Kristi [name]  has presented today for surgery, with the diagnosis of LEFT BREAST CANCER  The various methods of treatment have been discussed with the patient and family. After consideration of risks, benefits and other options for treatment, the patient has consented to  Procedure(s): INSERTION PORT-A-CATH WITH [place] (N/A) as a surgical intervention .  The patient's history has been reviewed, patient examined, no change in status, stable for surgery.  I have reviewed the patient's chart and labs.  Questions were answered to the patient's satisfaction.     Rolm Bookbinder

## 2018-01-15 NOTE — Anesthesia Postprocedure Evaluation (Signed)
Anesthesia Post Note  Patient: Kristi [name]  Procedure(s) Performed: INSERTION PORT-A-CATH WITH [place] (Right Neck)     Patient location during evaluation: PACU Anesthesia Type: General Level of consciousness: awake and alert Pain management: pain level controlled Vital Signs Assessment: post-procedure vital signs reviewed and stable Respiratory status: spontaneous breathing, nonlabored ventilation, respiratory function stable and patient connected to nasal cannula oxygen Cardiovascular status: blood pressure returned to baseline and stable Postop Assessment: no apparent nausea or vomiting Anesthetic complications: no    Last Vitals:  Vitals:   01/15/18 1034 01/15/18 1035  BP: 125/73   Pulse: (!) 123 (!) 115  Resp:  13  Temp:  (P) 36.5 C  SpO2: 100% 100%    Last Pain:  Vitals:   01/15/18 1035  TempSrc:   PainSc: (P) 0-No pain                 Osker Ayoub DAVID

## 2018-01-15 NOTE — Telephone Encounter (Signed)
-----   Message from Nicholas Lose, MD sent at 01/15/2018  4:19 PM EDT ----- Regarding: RE: home meds She needs to stop her BCP VG ----- Message ----- From: Jesse Fall, RN Sent: 01/12/2018   6:13 PM To: Nicholas Lose, MD, [facility] Subject: home meds                                      Pt will need anti nausea meds & EMLA called to her pharmacy before tues 6/25.  She also reports that she is on a birth control pill for her endometriosis that has estodial in it.  Is this OK?

## 2018-01-15 NOTE — Anesthesia Preprocedure Evaluation (Signed)
Anesthesia Evaluation  Patient identified by MRN, date of birth, ID band Patient awake    Reviewed: Allergy & Precautions, NPO status , Patient's Chart, lab work & pertinent test results  Airway Mallampati: I  TM Distance: >3 FB Neck ROM: Full    Dental   Pulmonary    Pulmonary exam normal        Cardiovascular Normal cardiovascular exam     Neuro/Psych    GI/Hepatic   Endo/Other    Renal/GU      Musculoskeletal   Abdominal   Peds  Hematology   Anesthesia Other Findings   Reproductive/Obstetrics                             Anesthesia Physical Anesthesia Plan  ASA: II  Anesthesia Plan: General   Post-op Pain Management:    Induction: Intravenous  PONV Risk Score and Plan: 3 and Ondansetron, Treatment may vary due to age or medical condition, Midazolam and Dexamethasone  Airway Management Planned: LMA  Additional Equipment:   Intra-op Plan:   Post-operative Plan: Extubation in OR  Informed Consent: I have reviewed the patients History and Physical, chart, labs and discussed the procedure including the risks, benefits and alternatives for the proposed anesthesia with the patient or authorized representative who has indicated his/her understanding and acceptance.     Plan Discussed with: CRNA and Surgeon  Anesthesia Plan Comments:         Anesthesia Quick Evaluation

## 2018-01-15 NOTE — Discharge Instructions (Signed)
PORT-A-CATH: POST OP INSTRUCTIONS  Always review your discharge instruction sheet given to you by the facility where your surgery was performed.   1. A prescription for pain medication may be given to you upon discharge. Take your pain medication as prescribed, if needed. If narcotic pain medicine is not needed, then you make take acetaminophen (Tylenol) or ibuprofen (Advil) as needed.  2. Take your usually prescribed medications unless otherwise directed. 3. If you need a refill on your pain medication, please contact our office. All narcotic pain medicine now requires a paper prescription.  Phoned in and fax refills are no longer allowed by law.  Prescriptions will not be filled after 5 pm or on weekends.  4. You should follow a light diet for the remainder of the day after your procedure. 5. Most patients will experience some mild swelling and/or bruising in the area of the incision. It may take several days to resolve. 6. It is common to experience some constipation if taking pain medication after surgery. Increasing fluid intake and taking a stool softener (such as Colace) will usually help or prevent this problem from occurring. A mild laxative (Milk of Magnesia or Miralax) should be taken according to package directions if there are no bowel movements after 48 hours.  7. Unless discharge instructions indicate otherwise, you may remove your bandages 48 hours after surgery, and you may shower at that time. You may have steri-strips (small white skin tapes) in place directly over the incision.  These strips should be left on the skin for 7-10 days.  If your surgeon used Dermabond (skin glue) on the incision, you may shower in 24 hours.  The glue will flake off over the next 2-3 weeks.  8. If your port is left accessed at the end of surgery (needle left in port), the dressing cannot get wet and should only by changed by a healthcare professional. When the port is no longer accessed (when the  needle has been removed), follow step 7.   9. ACTIVITIES:  Limit activity involving your arms for the next 72 hours. Do no strenuous exercise or activity for 1 week. You may drive when you are no longer taking prescription pain medication, you can comfortably wear a seatbelt, and you can maneuver your car. 10.You may need to see your doctor in the office for a follow-up appointment.  Please       check with your doctor.  11.When you receive a new Port-a-Cath, you will get a product guide and        ID card.  Please keep them in case you need them.  WHEN TO CALL YOUR DOCTOR [phone]): 1. Fever over 101.0 2. Chills 3. Continued bleeding from incision 4. Increased redness and tenderness at the site 5. Shortness of breath, difficulty breathing   The clinic staff is available to answer your questions during regular business hours. Please dont hesitate to call and ask to speak to one of the nurses or medical assistants for clinical concerns. If you have a medical emergency, go to the nearest emergency room or call 911.  A surgeon from [facility] is always on call at the hospital.     For further information, please visit [email or web address]     Tylenol 1000mg  given today at 8:30AM     Post Anesthesia Home Care Instructions  Activity: Get plenty of rest for the remainder of the day. A responsible individual must stay with you for 24 hours following the  procedure.  For the next 24 hours, DO NOT: -Drive a car -Paediatric nurse -Drink alcoholic beverages -Take any medication unless instructed by your physician -Make any legal decisions or sign important papers.  Meals: Start with liquid foods such as gelatin or soup. Progress to regular foods as tolerated. Avoid greasy, spicy, heavy foods. If nausea and/or vomiting occur, drink only clear liquids until the nausea and/or vomiting subsides. Call your physician if vomiting continues.  Special  Instructions/Symptoms: Your throat may feel dry or sore from the anesthesia or the breathing tube placed in your throat during surgery. If this causes discomfort, gargle with warm salt water. The discomfort should disappear within 24 hours.  If you had a scopolamine patch placed behind your ear for the management of post- operative nausea and/or vomiting:  1. The medication in the patch is effective for 72 hours, after which it should be removed.  Wrap patch in a tissue and discard in the trash. Wash hands thoroughly with soap and water. 2. You may remove the patch earlier than 72 hours if you experience unpleasant side effects which may include dry mouth, dizziness or visual disturbances. 3. Avoid touching the patch. Wash your hands with soap and water after contact with the patch.

## 2018-01-15 NOTE — Anesthesia Procedure Notes (Signed)
Procedure Name: LMA Insertion Date/Time: 01/15/2018 9:40 AM Performed by: Willa Frater, CRNA Pre-anesthesia Checklist: Patient identified, Emergency Drugs available, Suction available and Patient being monitored Patient Re-evaluated:Patient Re-evaluated prior to induction Oxygen Delivery Method: Circle system utilized Preoxygenation: Pre-oxygenation with 100% oxygen Induction Type: IV induction Ventilation: Mask ventilation without difficulty LMA: LMA inserted LMA Size: 4.0 Number of attempts: 1 Airway Equipment and Method: Bite block Placement Confirmation: positive ETCO2 Tube secured with: Tape Dental Injury: Teeth and Oropharynx as per pre-operative assessment

## 2018-01-15 NOTE — Transfer of Care (Signed)
Immediate Anesthesia Transfer of Care Note  Patient: Kristi [name]  Procedure(s) Performed: INSERTION PORT-A-CATH WITH [place] (Right Neck)  Patient Location: PACU  Anesthesia Type:General  Level of Consciousness: awake, alert  and drowsy  Airway & Oxygen Therapy: Patient Spontanous Breathing and Patient connected to face mask oxygen  Post-op Assessment: Report given to RN and Post -op Vital signs reviewed and stable  Post vital signs: Reviewed and stable  Last Vitals:  Vitals Value Taken Time  BP    Temp    Pulse 123 01/15/2018 10:34 AM  Resp    SpO2 100 % 01/15/2018 10:34 AM  Vitals shown include unvalidated device data.  Last Pain:  Vitals:   01/15/18 0814  TempSrc: Oral  PainSc: 0-No pain         Complications: No apparent anesthesia complications

## 2018-01-15 NOTE — Telephone Encounter (Signed)
Called pt & informed to stop birth control pills per Dr Lindi Adie.  She will discuss with him tomorrow.

## 2018-01-15 NOTE — Op Note (Signed)
Preoperative diagnosis:left breast cancer Postoperative diagnosis: same as above Procedure: right ij US guided powerport insertion Surgeon: Dr Serita Grammes EBL: minimal Anes: general  Specimens none Complications none Drains none Sponge count correct Dispo to pacu stable  Indications: This is a50 yof with node positive left breast cancer.She is indicated for chemotherapy.We discussed port placement.  Procedure: After informed consent was obtained the patient was taken to the operating room. She was given antibiotics. Sequential compression devices were on her legs. She was then placed under general anesthesia with an LMA. Then she was prepped and draped in the standard sterile surgical fashion. Surgical timeout was then performed.  I used the ultrasound to identify the right internal jugular vein.I then accessed the vein using the ultrasound.This aspirated blood. I then placed the wire. The wire was difficult to place initially so I readjusted the needle and was able to place the wire.  The wirewas confirmed by fluoroscopy and ultrasound to be in the correct position.I tunneled the line between the 2 sites.I then dilated the tract and placed the dilator assembly with the sheath. This was done under fluoroscopy. I then removed the sheath and dilator. The wire was also removed. The line was then pulled back to be in the venacava. I hooked this up to the port. I sutured this into place with 2-0 Prolene in 2 places. This aspirated blood and flushed easily.This was confirmed with a final fluoroscopy. I then closed this with 2-0 Vicryl and 4-0 Monocryl. This withdrew blood and I placed heparin in it. I accessed the port and left it that way for chemo tomorrow. Dermabond was placed on both the incisions.A dressing was placed. She tolerated this well and was transferred to the recovery room in stable condition

## 2018-01-16 ENCOUNTER — Telehealth: Payer: Self-pay | Admitting: Hematology and Oncology

## 2018-01-16 ENCOUNTER — Inpatient Hospital Stay (HOSPITAL_BASED_OUTPATIENT_CLINIC_OR_DEPARTMENT_OTHER): Payer: Commercial Managed Care - PPO | Admitting: Hematology and Oncology

## 2018-01-16 ENCOUNTER — Encounter (HOSPITAL_BASED_OUTPATIENT_CLINIC_OR_DEPARTMENT_OTHER): Payer: Self-pay | Admitting: General Surgery

## 2018-01-16 ENCOUNTER — Inpatient Hospital Stay: Payer: Commercial Managed Care - PPO

## 2018-01-16 ENCOUNTER — Encounter: Payer: Self-pay | Admitting: *Deleted

## 2018-01-16 DIAGNOSIS — Z170 Estrogen receptor positive status [ER+]: Secondary | ICD-10-CM | POA: Diagnosis not present

## 2018-01-16 DIAGNOSIS — C50412 Malignant neoplasm of upper-outer quadrant of left female breast: Secondary | ICD-10-CM

## 2018-01-16 DIAGNOSIS — Z95828 Presence of other vascular implants and grafts: Secondary | ICD-10-CM

## 2018-01-16 DIAGNOSIS — R232 Flushing: Secondary | ICD-10-CM

## 2018-01-16 HISTORY — DX: Flushing: R23.2

## 2018-01-16 LAB — CBC WITH DIFFERENTIAL (CANCER CENTER ONLY)
BASOS ABS: 0 10*3/uL (ref 0.0–0.1)
Basophils Relative: 0 %
EOS ABS: 0 10*3/uL (ref 0.0–0.5)
EOS PCT: 0 %
HCT: 39.3 % (ref 34.8–46.6)
HEMOGLOBIN: 13.2 g/dL (ref 11.6–15.9)
LYMPHS ABS: 1.4 10*3/uL (ref 0.9–3.3)
LYMPHS PCT: 10 %
MCH: 29.8 pg (ref 25.1–34.0)
MCHC: 33.5 g/dL (ref 31.5–36.0)
MCV: 88.8 fL (ref 79.5–101.0)
Monocytes Absolute: 0.6 10*3/uL (ref 0.1–0.9)
Monocytes Relative: 4 %
NEUTROS PCT: 86 %
Neutro Abs: 11.7 10*3/uL — ABNORMAL HIGH (ref 1.5–6.5)
PLATELETS: 420 10*3/uL — AB (ref 145–400)
RBC: 4.42 MIL/uL (ref 3.70–5.45)
RDW: 13.2 % (ref 11.2–14.5)
WBC Count: 13.7 10*3/uL — ABNORMAL HIGH (ref 3.9–10.3)

## 2018-01-16 LAB — CMP (CANCER CENTER ONLY)
ALT: 11 U/L (ref 0–44)
AST: 14 U/L — AB (ref 15–41)
Albumin: 3.9 g/dL (ref 3.5–5.0)
Alkaline Phosphatase: 50 U/L (ref 38–126)
Anion gap: 10 (ref 5–15)
BUN: 6 mg/dL (ref 6–20)
CHLORIDE: 103 mmol/L (ref 98–111)
CO2: 21 mmol/L — AB (ref 22–32)
CREATININE: 0.74 mg/dL (ref 0.44–1.00)
Calcium: 8.9 mg/dL (ref 8.9–10.3)
GFR, Est AFR Am: >60 mL/min (ref >60)
GFR, Estimated: >60 mL/min (ref >60)
Glucose, Bld: 101 mg/dL — ABNORMAL HIGH (ref 70–99)
Potassium: 3.2 mmol/L — ABNORMAL LOW (ref 3.5–5.1)
SODIUM: 134 mmol/L — AB (ref 135–145)
TOTAL PROTEIN: 7 g/dL (ref 6.5–8.1)
Total Bilirubin: 0.4 mg/dL (ref 0.3–1.2)

## 2018-01-16 MED ORDER — PALONOSETRON HCL INJECTION 0.25 MG/5ML
INTRAVENOUS | Status: AC
  Filled 2018-01-16: qty 5

## 2018-01-16 MED ORDER — SODIUM CHLORIDE 0.9% FLUSH
10.0000 mL | INTRAVENOUS | Status: DC | PRN
  Administered 2018-01-16: 10 mL
  Filled 2018-01-16: qty 10

## 2018-01-16 MED ORDER — SODIUM CHLORIDE 0.9% FLUSH
10.0000 mL | INTRAVENOUS | Status: DC | PRN
  Administered 2018-01-16: 10 mL via INTRAVENOUS
  Filled 2018-01-16: qty 10

## 2018-01-16 MED ORDER — GOSERELIN ACETATE 3.6 MG ~~LOC~~ IMPL
3.6000 mg | DRUG_IMPLANT | Freq: Once | SUBCUTANEOUS | Status: AC
  Administered 2018-01-16: 3.6 mg via SUBCUTANEOUS

## 2018-01-16 MED ORDER — SODIUM CHLORIDE 0.9 % IV SOLN
Freq: Once | INTRAVENOUS | Status: AC
  Administered 2018-01-16: 13:00:00 via INTRAVENOUS

## 2018-01-16 MED ORDER — GOSERELIN ACETATE 3.6 MG ~~LOC~~ IMPL
DRUG_IMPLANT | SUBCUTANEOUS | Status: AC
  Filled 2018-01-16: qty 3.6

## 2018-01-16 MED ORDER — PEGFILGRASTIM 6 MG/0.6ML ~~LOC~~ PSKT
PREFILLED_SYRINGE | SUBCUTANEOUS | Status: AC
  Filled 2018-01-16: qty 0.6

## 2018-01-16 MED ORDER — PALONOSETRON HCL INJECTION 0.25 MG/5ML
0.2500 mg | Freq: Once | INTRAVENOUS | Status: AC
  Administered 2018-01-16: 0.25 mg via INTRAVENOUS

## 2018-01-16 MED ORDER — DOXORUBICIN HCL CHEMO IV INJECTION 2 MG/ML
60.0000 mg/m2 | Freq: Once | INTRAVENOUS | Status: AC
  Administered 2018-01-16: 106 mg via INTRAVENOUS
  Filled 2018-01-16: qty 53

## 2018-01-16 MED ORDER — SODIUM CHLORIDE 0.9 % IV SOLN
600.0000 mg/m2 | Freq: Once | INTRAVENOUS | Status: AC
  Administered 2018-01-16: 1060 mg via INTRAVENOUS
  Filled 2018-01-16: qty 53

## 2018-01-16 MED ORDER — SODIUM CHLORIDE 0.9 % IV SOLN
Freq: Once | INTRAVENOUS | Status: AC
  Administered 2018-01-16: 13:00:00 via INTRAVENOUS
  Filled 2018-01-16: qty 5

## 2018-01-16 MED ORDER — HEPARIN SOD (PORK) LOCK FLUSH 100 UNIT/ML IV SOLN
500.0000 [IU] | Freq: Once | INTRAVENOUS | Status: AC | PRN
  Administered 2018-01-16: 500 [IU]
  Filled 2018-01-16: qty 5

## 2018-01-16 MED ORDER — PEGFILGRASTIM 6 MG/0.6ML ~~LOC~~ PSKT
6.0000 mg | PREFILLED_SYRINGE | Freq: Once | SUBCUTANEOUS | Status: AC
  Administered 2018-01-16: 6 mg via SUBCUTANEOUS

## 2018-01-16 NOTE — Progress Notes (Signed)
Patient Care Team: [name] as PCP - General (Physician Assistant)  DIAGNOSIS:  Encounter Diagnosis  Name Primary?  . Malignant neoplasm of upper-outer quadrant of left breast in female, estrogen receptor positive ([place])     SUMMARY OF ONCOLOGIC HISTORY:   Malignant neoplasm of upper-outer quadrant of left breast in female, estrogen receptor positive ([place])   01/02/2018 Initial Diagnosis    Palpable lump left breast, mammogram: Left breast UOQ 2.3 cm mass, axilla has several enlarged lymph nodes; 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      01/04/2018 Cancer Staging    Staging form: Breast, AJCC 8th Edition - Clinical: Stage IIB (cT2, cN1, cM0, G3, ER+, PR+, HER2: Equivocal) - Signed by Nicholas Lose, MD on 01/04/2018      01/16/2018 -  Neo-Adjuvant Chemotherapy    Dose dense Adriamycin and Cytoxan x4 followed by Taxol weekly x12       CHIEF COMPLIANT: Cycle 1 dose dense Adriamycin and Cytoxan  INTERVAL HISTORY: Kristi [name] is a 42 year old with above-mentioned history of left breast cancer who underwent port placement yesterday and is here to begin her first cycle of neoadjuvant chemotherapy with dose dense Adriamycin and Cytoxan.  Echocardiogram was normal.  She is anxious to get started.  REVIEW OF SYSTEMS:   Constitutional: Denies fevers, chills or abnormal weight loss Eyes: Denies blurriness of vision Ears, nose, mouth, throat, and face: Denies mucositis or sore throat Respiratory: Denies cough, dyspnea or wheezes Cardiovascular: Denies palpitation, chest discomfort Gastrointestinal:  Denies nausea, heartburn or change in bowel habits Skin: Denies abnormal skin rashes Lymphatics: Denies new lymphadenopathy or easy bruising Neurological:Denies numbness, tingling or new weaknesses Behavioral/Psych: Mood is stable, no new changes  Extremities: No  lower extremity edema Breast: Left breast lump All other systems were reviewed with the patient and are negative.  I have reviewed the past medical history, past surgical history, social history and family history with the patient and they are unchanged from previous note.  ALLERGIES:  is allergic to sulfa antibiotics.  MEDICATIONS:  Current Outpatient Medications  Medication Sig Dispense Refill  . baclofen (LIORESAL) 10 MG tablet Take 10 mg by mouth as needed.    Marland Kitchen buPROPion (WELLBUTRIN) 75 MG tablet Take 75 mg by mouth 2 (two) times daily.    Marland Kitchen dexamethasone (DECADRON) 4 MG tablet Take 1 tablet day after chemotherapy and 1 tablet 2 days after chemotherapy with food 8 tablet 1  . EMGALITY 120 MG/ML SOAJ 120 mg every 30 (thirty) days.    . Levonorgestrel-Ethinyl Estradiol (SEASONIQUE) 0.15-0.03 &0.01 MG tablet Take 1 tablet by mouth daily.    Marland Kitchen lidocaine-prilocaine (EMLA) cream Apply to affected area once 30 g 3  . LORazepam (ATIVAN) 0.5 MG tablet Take 1 tablet (0.5 mg total) by mouth every 6 (six) hours as needed (Nausea or vomiting). 30 tablet 0  . Magnesium 500 MG CAPS Take 500 mg by mouth 2 (two) times daily.    . ondansetron (ZOFRAN) 8 MG tablet Take 1 tablet (8 mg total) by mouth 2 (two) times daily as needed. Start on the third day after chemotherapy. 30 tablet 1  . prochlorperazine (COMPAZINE) 10 MG tablet Take 1 tablet (10 mg total) by mouth every 6 (six) hours as needed (Nausea or vomiting). 30 tablet 1  . riboflavin (VITAMIN B-2) 100 MG TABS tablet Take 400 mg by mouth 2 (two) times  daily.    . rizatriptan (MAXALT) 10 MG tablet Take 10 mg by mouth as needed for migraine. May repeat in 2 hours if needed    . traMADol (ULTRAM) 50 MG tablet Take 1 tablet (50 mg total) by mouth every 6 (six) hours as needed. 10 tablet 0   No current facility-administered medications for this visit.     PHYSICAL EXAMINATION: ECOG PERFORMANCE STATUS: 1 - Symptomatic but completely  ambulatory  Vitals:   01/16/18 1141  BP: 135/89  Pulse: 88  Resp: 18  Temp: 98.1 F (36.7 C)  SpO2: 100%   Filed Weights   01/16/18 1141  Weight: 146 lb 8 oz (66.5 kg)    GENERAL:alert, no distress and comfortable SKIN: skin color, texture, turgor are normal, no rashes or significant lesions EYES: normal, Conjunctiva are pink and non-injected, sclera clear OROPHARYNX:no exudate, no erythema and lips, buccal mucosa, and tongue normal  NECK: supple, thyroid normal size, non-tender, without nodularity LYMPH:  no palpable lymphadenopathy in the cervical, axillary or inguinal LUNGS: clear to auscultation and percussion with normal breathing effort HEART: regular rate & rhythm and no murmurs and no lower extremity edema ABDOMEN:abdomen soft, non-tender and normal bowel sounds MUSCULOSKELETAL:no cyanosis of digits and no clubbing  NEURO: alert & oriented x 3 with fluent speech, no focal motor/sensory deficits EXTREMITIES: No lower extremity edema  LABORATORY DATA:  I have reviewed the data as listed CMP Latest Ref Rng & Units 01/09/2018  Glucose 70 - 140 mg/dL 82  BUN 7 - 26 mg/dL 10  Creatinine 0.60 - 1.10 mg/dL 0.81  Sodium 136 - 145 mmol/L 140  Potassium 3.5 - 5.1 mmol/L 4.1  Chloride 98 - 109 mmol/L 105  CO2 22 - 29 mmol/L 25  Calcium 8.4 - 10.4 mg/dL 9.3  Total Protein 6.4 - 8.3 g/dL 7.5  Total Bilirubin 0.2 - 1.2 mg/dL 0.4  Alkaline Phos 40 - 150 U/L 50  AST 5 - 34 U/L 14  ALT 0 - 55 U/L 11    Lab Results  Component Value Date   WBC 13.7 (H) 01/16/2018   HGB 13.2 01/16/2018   HCT 39.3 01/16/2018   MCV 88.8 01/16/2018   PLT 420 (H) 01/16/2018   NEUTROABS 11.7 (H) 01/16/2018    ASSESSMENT & PLAN:  Malignant neoplasm of upper-outer quadrant of left breast in female, estrogen receptor positive ([place]) 01/02/2018: Palpable lump left breast since December 2018, mammogram: Left breast UOQ 2.3 cm mass, axilla has several enlarged lymph nodes; biopsy revealed grade 3 IDC  with perineural invasion, lymph node also positive for IDC, ER 75%, PR 70%, Ki-67 60%, HER-2 IHC equivocal FISH: Neg.  Recommendation: 1. Neoadjuvant chemotherapy with dose dense Adriamycin and Cytoxan followed by Taxol weekly x12 2. Followed by surgery (which could be mastectomy) with AXLND versus TAD 3. Followed by radiation 4. Followed by antiestrogen therapy  Review of scans: No evidence of metastatic disease by CT scans and bone scan. Breast MRI 01/10/2018: Necrotic mass 2.4 x 3.2 x 4 cm, 4 abnormal lymph nodes in the axilla  Current treatment: Cycle 1 day 1 dose dense Adriamycin and Cytoxan Chemo consent obtained Chemo education completed Echocardiogram revealed EF 60 to 65% on 01/09/2018 Labs reviewed Anti-emetics reviewed  RTC in 1 week for tox check       No orders of the defined types were placed in this encounter.  The patient has a good understanding of the overall plan. she agrees with it. she will call with any  problems that may develop before the next visit here.   Harriette Ohara, MD 01/16/18

## 2018-01-16 NOTE — Assessment & Plan Note (Signed)
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  Recommendation: 1. Neoadjuvant chemotherapy with dose dense Adriamycin and Cytoxan followed by Taxol weekly x12 2. Followed by surgery (which could be mastectomy) with AXLND versus TAD 3. Followed by radiation 4. Followed by antiestrogen therapy  Review of scans: No evidence of metastatic disease by CT scans and bone scan. Breast MRI 01/10/2018: Necrotic mass 2.4 x 3.2 x 4 cm, 4 abnormal lymph nodes in the axilla  Current treatment: Cycle 1 day 1 dose dense Adriamycin and Cytoxan Chemo consent obtained Chemo education completed Echocardiogram revealed EF 60 to 65% on 01/09/2018 Labs reviewed Anti-emetics reviewed  RTC in 1 week for tox check

## 2018-01-16 NOTE — Progress Notes (Signed)
Per Elmyra Ricks, RN who s/w pt, pt is not sexually active.  She is single per EMR documentation. Pt stopped her oral contraception yesterday.  I took it off her med list. We will not need urine pregnancy test. Kennith Center, Pharm.D., CPP 01/16/2018@1 :03 PM

## 2018-01-16 NOTE — Telephone Encounter (Signed)
No 6/25 los °

## 2018-01-16 NOTE — Patient Instructions (Addendum)
[facility] Discharge Instructions for Patients Receiving Chemotherapy  Today you received the following chemotherapy agents:  Adriamycin, Cytoxan  To help prevent nausea and vomiting after your treatment, we encourage you to take your nausea medication as prescribed.   If you develop nausea and vomiting that is not controlled by your nausea medication, call the clinic.   BELOW ARE SYMPTOMS THAT SHOULD BE REPORTED IMMEDIATELY:  *FEVER GREATER THAN 100.5 F  *CHILLS WITH OR WITHOUT FEVER  NAUSEA AND VOMITING THAT IS NOT CONTROLLED WITH YOUR NAUSEA MEDICATION  *UNUSUAL SHORTNESS OF BREATH  *UNUSUAL BRUISING OR BLEEDING  TENDERNESS IN MOUTH AND THROAT WITH OR WITHOUT PRESENCE OF ULCERS  *URINARY PROBLEMS  *BOWEL PROBLEMS  UNUSUAL RASH Items with * indicate a potential emergency and should be followed up as soon as possible.  Feel free to call the clinic should you have any questions or concerns. The clinic phone number is [phone].  Please show the [place] at check-in to the Emergency Department and triage nurse.    Cyclophosphamide injection What is this medicine? CYCLOPHOSPHAMIDE (sye kloe FOSS fa mide) is a chemotherapy drug. It slows the growth of cancer cells. This medicine is used to treat many types of cancer like lymphoma, myeloma, leukemia, breast cancer, and ovarian cancer, to name a few. This medicine may be used for other purposes; ask your health care provider or pharmacist if you have questions. COMMON BRAND NAME(S): Cytoxan, Neosar What should I tell my health care provider before I take this medicine? They need to know if you have any of these conditions: -blood disorders -history of other chemotherapy -infection -kidney disease -liver disease -recent or ongoing radiation therapy -tumors in the bone marrow -an unusual or allergic reaction to cyclophosphamide, other chemotherapy, other medicines, foods, dyes, or  preservatives -pregnant or trying to get pregnant -breast-feeding How should I use this medicine? This drug is usually given as an injection into a vein or muscle or by infusion into a vein. It is administered in a hospital or clinic by a specially trained health care professional. Talk to your pediatrician regarding the use of this medicine in children. Special care may be needed. Overdosage: If you think you have taken too much of this medicine contact a poison control center or emergency room at once. NOTE: This medicine is only for you. Do not share this medicine with others. What if I miss a dose? It is important not to miss your dose. Call your doctor or health care professional if you are unable to keep an appointment. What may interact with this medicine? This medicine may interact with the following medications: -amiodarone -amphotericin B -azathioprine -certain antiviral medicines for HIV or AIDS such as protease inhibitors (e.g., indinavir, ritonavir) and zidovudine -certain blood pressure medications such as benazepril, captopril, enalapril, fosinopril, lisinopril, moexipril, monopril, perindopril, quinapril, ramipril, trandolapril -certain cancer medications such as anthracyclines (e.g., daunorubicin, doxorubicin), busulfan, cytarabine, paclitaxel, pentostatin, tamoxifen, trastuzumab -certain diuretics such as chlorothiazide, chlorthalidone, hydrochlorothiazide, indapamide, metolazone -certain medicines that treat or prevent blood clots like warfarin -certain muscle relaxants such as succinylcholine -cyclosporine -etanercept -indomethacin -medicines to increase blood counts like filgrastim, pegfilgrastim, sargramostim -medicines used as general anesthesia -metronidazole -natalizumab This list may not describe all possible interactions. Give your health care provider a list of all the medicines, herbs, non-prescription drugs, or dietary supplements you use. Also tell them if  you smoke, drink alcohol, or use illegal drugs. Some items may interact with your medicine. What should  I watch for while using this medicine? Visit your doctor for checks on your progress. This drug may make you feel generally unwell. This is not uncommon, as chemotherapy can affect healthy cells as well as cancer cells. Report any side effects. Continue your course of treatment even though you feel ill unless your doctor tells you to stop. Drink water or other fluids as directed. Urinate often, even at night. In some cases, you may be given additional medicines to help with side effects. Follow all directions for their use. Call your doctor or health care professional for advice if you get a fever, chills or sore throat, or other symptoms of a cold or flu. Do not treat yourself. This drug decreases your body's ability to fight infections. Try to avoid being around people who are sick. This medicine may increase your risk to bruise or bleed. Call your doctor or health care professional if you notice any unusual bleeding. Be careful brushing and flossing your teeth or using a toothpick because you may get an infection or bleed more easily. If you have any dental work done, tell your dentist you are receiving this medicine. You may get drowsy or dizzy. Do not drive, use machinery, or do anything that needs mental alertness until you know how this medicine affects you. Do not become pregnant while taking this medicine or for 1 year after stopping it. Women should inform their doctor if they wish to become pregnant or think they might be pregnant. Men should not father a child while taking this medicine and for 4 months after stopping it. There is a potential for serious side effects to an unborn child. Talk to your health care professional or pharmacist for more information. Do not breast-feed an infant while taking this medicine. This medicine may interfere with the ability to have a child. This medicine  has caused ovarian failure in some women. This medicine has caused reduced sperm counts in some men. You should talk with your doctor or health care professional if you are concerned about your fertility. If you are going to have surgery, tell your doctor or health care professional that you have taken this medicine. What side effects may I notice from receiving this medicine? Side effects that you should report to your doctor or health care professional as soon as possible: -allergic reactions like skin rash, itching or hives, swelling of the face, lips, or tongue -low blood counts - this medicine may decrease the number of white blood cells, red blood cells and platelets. You may be at increased risk for infections and bleeding. -signs of infection - fever or chills, cough, sore throat, pain or difficulty passing urine -signs of decreased platelets or bleeding - bruising, pinpoint red spots on the skin, black, tarry stools, blood in the urine -signs of decreased red blood cells - unusually weak or tired, fainting spells, lightheadedness -breathing problems -dark urine -dizziness -palpitations -swelling of the ankles, feet, hands -trouble passing urine or change in the amount of urine -weight gain -yellowing of the eyes or skin Side effects that usually do not require medical attention (report to your doctor or health care professional if they continue or are bothersome): -changes in nail or skin color -hair loss -missed menstrual periods -mouth sores -nausea, vomiting This list may not describe all possible side effects. Call your doctor for medical advice about side effects. You may report side effects to FDA at 1-800-FDA-1088. Where should I keep my medicine? This drug is given in  a hospital or clinic and will not be stored at home. NOTE: This sheet is a summary. It may not cover all possible information. If you have questions about this medicine, talk to your doctor, pharmacist, or  health care provider.  2018 Elsevier/Gold Standard (2012-05-25 16:22:58)   Doxorubicin injection What is this medicine? DOXORUBICIN (dox oh ROO bi sin) is a chemotherapy drug. It is used to treat many kinds of cancer like leukemia, lymphoma, neuroblastoma, sarcoma, and Wilms' tumor. It is also used to treat bladder cancer, breast cancer, lung cancer, ovarian cancer, stomach cancer, and thyroid cancer. This medicine may be used for other purposes; ask your health care provider or pharmacist if you have questions. COMMON BRAND NAME(S): Adriamycin, Adriamycin PFS, Adriamycin RDF, Rubex What should I tell my health care provider before I take this medicine? They need to know if you have any of these conditions: -heart disease -history of low blood counts caused by a medicine -liver disease -recent or ongoing radiation therapy -an unusual or allergic reaction to doxorubicin, other chemotherapy agents, other medicines, foods, dyes, or preservatives -pregnant or trying to get pregnant -breast-feeding How should I use this medicine? This drug is given as an infusion into a vein. It is administered in a hospital or clinic by a specially trained health care professional. If you have pain, swelling, burning or any unusual feeling around the site of your injection, tell your health care professional right away. Talk to your pediatrician regarding the use of this medicine in children. Special care may be needed. Overdosage: If you think you have taken too much of this medicine contact a poison control center or emergency room at once. NOTE: This medicine is only for you. Do not share this medicine with others. What if I miss a dose? It is important not to miss your dose. Call your doctor or health care professional if you are unable to keep an appointment. What may interact with this medicine? This medicine may interact with the following medications: -6-mercaptopurine -paclitaxel -phenytoin -St.  John's Wort -trastuzumab -verapamil This list may not describe all possible interactions. Give your health care provider a list of all the medicines, herbs, non-prescription drugs, or dietary supplements you use. Also tell them if you smoke, drink alcohol, or use illegal drugs. Some items may interact with your medicine. What should I watch for while using this medicine? This drug may make you feel generally unwell. This is not uncommon, as chemotherapy can affect healthy cells as well as cancer cells. Report any side effects. Continue your course of treatment even though you feel ill unless your doctor tells you to stop. There is a maximum amount of this medicine you should receive throughout your life. The amount depends on the medical condition being treated and your overall health. Your doctor will watch how much of this medicine you receive in your lifetime. Tell your doctor if you have taken this medicine before. You may need blood work done while you are taking this medicine. Your urine may turn red for a few days after your dose. This is not blood. If your urine is dark or brown, call your doctor. In some cases, you may be given additional medicines to help with side effects. Follow all directions for their use. Call your doctor or health care professional for advice if you get a fever, chills or sore throat, or other symptoms of a cold or flu. Do not treat yourself. This drug decreases your body's ability to fight infections.  Try to avoid being around people who are sick. This medicine may increase your risk to bruise or bleed. Call your doctor or health care professional if you notice any unusual bleeding. Talk to your doctor about your risk of cancer. You may be more at risk for certain types of cancers if you take this medicine. Do not become pregnant while taking this medicine or for 6 months after stopping it. Women should inform their doctor if they wish to become pregnant or think they  might be pregnant. Men should not father a child while taking this medicine and for 6 months after stopping it. There is a potential for serious side effects to an unborn child. Talk to your health care professional or pharmacist for more information. Do not breast-feed an infant while taking this medicine. This medicine has caused ovarian failure in some women and reduced sperm counts in some men This medicine may interfere with the ability to have a child. Talk with your doctor or health care professional if you are concerned about your fertility. What side effects may I notice from receiving this medicine? Side effects that you should report to your doctor or health care professional as soon as possible: -allergic reactions like skin rash, itching or hives, swelling of the face, lips, or tongue -breathing problems -chest pain -fast or irregular heartbeat -low blood counts - this medicine may decrease the number of white blood cells, red blood cells and platelets. You may be at increased risk for infections and bleeding. -pain, redness, or irritation at site where injected -signs of infection - fever or chills, cough, sore throat, pain or difficulty passing urine -signs of decreased platelets or bleeding - bruising, pinpoint red spots on the skin, black, tarry stools, blood in the urine -swelling of the ankles, feet, hands -tiredness -weakness Side effects that usually do not require medical attention (report to your doctor or health care professional if they continue or are bothersome): -diarrhea -hair loss -mouth sores -nail discoloration or damage -nausea -red colored urine -vomiting This list may not describe all possible side effects. Call your doctor for medical advice about side effects. You may report side effects to FDA at 1-800-FDA-1088. Where should I keep my medicine? This drug is given in a hospital or clinic and will not be stored at home. NOTE: This sheet is a summary. It  may not cover all possible information. If you have questions about this medicine, talk to your doctor, pharmacist, or health care provider.  2018 Elsevier/Gold Standard (2015-09-07 11:28:51)   Fosaprepitant injection What is this medicine? FOSAPREPITANT (fos ap RE pi tant) is used together with other medicines to prevent nausea and vomiting caused by cancer treatment (chemotherapy). This medicine may be used for other purposes; ask your health care provider or pharmacist if you have questions. COMMON BRAND NAME(S): Emend What should I tell my health care provider before I take this medicine? They need to know if you have any of these conditions: -liver disease -an unusual or allergic reaction to fosaprepitant, aprepitant, medicines, foods, dyes, or preservatives -pregnant or trying to get pregnant -breast-feeding How should I use this medicine? This medicine is for injection into a vein. It is given by a health care professional in a hospital or clinic setting. Talk to your pediatrician regarding the use of this medicine in children. Special care may be needed. Overdosage: If you think you have taken too much of this medicine contact a poison control center or emergency room at once.  NOTE: This medicine is only for you. Do not share this medicine with others. What if I miss a dose? This does not apply. What may interact with this medicine? Do not take this medicine with any of these medicines: -cisapride -flibanserin -lomitapide -pimozide This medicine may also interact with the following medications: -diltiazem -female hormones, like estrogens or progestins and birth control pills -medicines for fungal infections like ketoconazole and itraconazole -medicines for HIV -medicines for seizures or to control epilepsy like carbamazepine or phenytoin -medicines used for sleep or anxiety disorders like alprazolam, diazepam, or  midazolam -nefazodone -paroxetine -ranolazine -rifampin -some chemotherapy medications like etoposide, ifosfamide, vinblastine, vincristine -some antibiotics like clarithromycin, erythromycin, troleandomycin -steroid medicines like dexamethasone or methylprednisolone -tolbutamide -warfarin This list may not describe all possible interactions. Give your health care provider a list of all the medicines, herbs, non-prescription drugs, or dietary supplements you use. Also tell them if you smoke, drink alcohol, or use illegal drugs. Some items may interact with your medicine. What should I watch for while using this medicine? Do not take this medicine if you already have nausea and vomiting. Ask your health care provider what to do if you already have nausea. Birth control pills and other methods of hormonal contraception (for example, IUD or patch) may not work properly while you are taking this medicine. Use an extra method of birth control during treatment and for 1 month after your last dose of fosaprepitant. This medicine should not be used continuously for a long time. Visit your doctor or health care professional for regular check-ups. This medicine may change your liver function blood test results. What side effects may I notice from receiving this medicine? Side effects that you should report to your doctor or health care professional as soon as possible: -allergic reactions like skin rash, itching or hives, swelling of the face, lips, or tongue -breathing problems -changes in heart rhythm -high or low blood pressure -pain, redness, or irritation at site where injected -rectal bleeding -serious dizziness or disorientation, confusion -sharp or severe stomach pain -sharp pain in your leg Side effects that usually do not require medical attention (report to your doctor or health care professional if they continue or are bothersome): -constipation or diarrhea -hair  loss -headache -hiccups -loss of appetite -nausea -upset stomach -tiredness This list may not describe all possible side effects. Call your doctor for medical advice about side effects. You may report side effects to FDA at 1-800-FDA-1088. Where should I keep my medicine? This drug is given in a hospital or clinic and will not be stored at home. NOTE: This sheet is a summary. It may not cover all possible information. If you have questions about this medicine, talk to your doctor, pharmacist, or health care provider.  2018 Elsevier/Gold Standard (2014-08-27 10:45:34)  Goserelin injection What is this medicine? GOSERELIN (GOE se rel in) is similar to a hormone found in the body. It lowers the amount of sex hormones that the body makes. Men will have lower testosterone levels and women will have lower estrogen levels while taking this medicine. In men, this medicine is used to treat prostate cancer; the injection is either given once per month or once every 12 weeks. A once per month injection (only) is used to treat women with endometriosis, dysfunctional uterine bleeding, or advanced breast cancer. This medicine may be used for other purposes; ask your health care provider or pharmacist if you have questions. COMMON BRAND NAME(S): Zoladex What should I tell my health  care provider before I take this medicine? They need to know if you have any of these conditions (some only apply to women): -diabetes -heart disease or previous heart attack -high blood pressure -high cholesterol -kidney disease -osteoporosis or low bone density -problems passing urine -spinal cord injury -stroke -tobacco smoker -an unusual or allergic reaction to goserelin, hormone therapy, other medicines, foods, dyes, or preservatives -pregnant or trying to get pregnant -breast-feeding How should I use this medicine? This medicine is for injection under the skin. It is given by a health care professional in a  hospital or clinic setting. Men receive this injection once every 4 weeks or once every 12 weeks. Women will only receive the once every 4 weeks injection. Talk to your pediatrician regarding the use of this medicine in children. Special care may be needed. Overdosage: If you think you have taken too much of this medicine contact a poison control center or emergency room at once. NOTE: This medicine is only for you. Do not share this medicine with others. What if I miss a dose? It is important not to miss your dose. Call your doctor or health care professional if you are unable to keep an appointment. What may interact with this medicine? -female hormones like estrogen -herbal or dietary supplements like black cohosh, chasteberry, or DHEA -female hormones like testosterone -prasterone This list may not describe all possible interactions. Give your health care provider a list of all the medicines, herbs, non-prescription drugs, or dietary supplements you use. Also tell them if you smoke, drink alcohol, or use illegal drugs. Some items may interact with your medicine. What should I watch for while using this medicine? Visit your doctor or health care professional for regular checks on your progress. Your symptoms may appear to get worse during the first weeks of this therapy. Tell your doctor or healthcare professional if your symptoms do not start to get better or if they get worse after this time. Your bones may get weaker if you take this medicine for a long time. If you smoke or frequently drink alcohol you may increase your risk of bone loss. A family history of osteoporosis, chronic use of drugs for seizures (convulsions), or corticosteroids can also increase your risk of bone loss. Talk to your doctor about how to keep your bones strong. This medicine should stop regular monthly menstration in women. Tell your doctor if you continue to [facility]. Women should not become pregnant while taking this  medicine or for 12 weeks after stopping this medicine. Women should inform their doctor if they wish to become pregnant or think they might be pregnant. There is a potential for serious side effects to an unborn child. Talk to your health care professional or pharmacist for more information. Do not breast-feed an infant while taking this medicine. Men should inform their doctors if they wish to father a child. This medicine may lower sperm counts. Talk to your health care professional or pharmacist for more information. What side effects may I notice from receiving this medicine? Side effects that you should report to your doctor or health care professional as soon as possible: -allergic reactions like skin rash, itching or hives, swelling of the face, lips, or tongue -bone pain -breathing problems -changes in vision -chest pain -feeling faint or lightheaded, falls -fever, chills -pain, swelling, warmth in the leg -pain, tingling, numbness in the hands or feet -signs and symptoms of low blood pressure like dizziness; feeling faint or lightheaded, falls; unusually weak  or tired -stomach pain -swelling of the ankles, feet, hands -trouble passing urine or change in the amount of urine -unusually high or low blood pressure -unusually weak or tired Side effects that usually do not require medical attention (report to your doctor or health care professional if they continue or are bothersome): -change in sex drive or performance -changes in breast size in both males and females -changes in emotions or moods -headache -hot flashes -irritation at site where injected -loss of appetite -skin problems like acne, dry skin -vaginal dryness This list may not describe all possible side effects. Call your doctor for medical advice about side effects. You may report side effects to FDA at 1-800-FDA-1088. Where should I keep my medicine? This drug is given in a hospital or clinic and will not be stored  at home. NOTE: This sheet is a summary. It may not cover all possible information. If you have questions about this medicine, talk to your doctor, pharmacist, or health care provider.  2018 [employer]/Gold Standard (2013-09-17 11:10:35)  Pegfilgrastim injection What is this medicine? PEGFILGRASTIM (PEG fil gra stim) is a long-acting granulocyte colony-stimulating factor that stimulates the growth of neutrophils, a type of white blood cell important in the body's fight against infection. It is used to reduce the incidence of fever and infection in patients with certain types of cancer who are receiving chemotherapy that affects the bone marrow, and to increase survival after being exposed to high doses of radiation. This medicine may be used for other purposes; ask your health care provider or pharmacist if you have questions. COMMON BRAND NAME(S): Neulasta What should I tell my health care provider before I take this medicine? They need to know if you have any of these conditions: -kidney disease -latex allergy -ongoing radiation therapy -sickle cell disease -skin reactions to acrylic adhesives (On-Body Injector only) -an unusual or allergic reaction to pegfilgrastim, filgrastim, other medicines, foods, dyes, or preservatives -pregnant or trying to get pregnant -breast-feeding How should I use this medicine? This medicine is for injection under the skin. If you get this medicine at home, you will be taught how to prepare and give the pre-filled syringe or how to use the On-body Injector. Refer to the patient Instructions for Use for detailed instructions. Use exactly as directed. Tell your healthcare provider immediately if you suspect that the On-body Injector may not have performed as intended or if you suspect the use of the On-body Injector resulted in a missed or partial dose. It is important that you put your used needles and syringes in a special sharps container. Do not put them in a  trash can. If you do not have a sharps container, call your pharmacist or healthcare provider to get one. Talk to your pediatrician regarding the use of this medicine in children. While this drug may be prescribed for selected conditions, precautions do apply. Overdosage: If you think you have taken too much of this medicine contact a poison control center or emergency room at once. NOTE: This medicine is only for you. Do not share this medicine with others. What if I miss a dose? It is important not to miss your dose. Call your doctor or health care professional if you miss your dose. If you miss a dose due to an On-body Injector failure or leakage, a new dose should be administered as soon as possible using a single prefilled syringe for manual use. What may interact with this medicine? Interactions have not been studied. Give your  health care provider a list of all the medicines, herbs, non-prescription drugs, or dietary supplements you use. Also tell them if you smoke, drink alcohol, or use illegal drugs. Some items may interact with your medicine. This list may not describe all possible interactions. Give your health care provider a list of all the medicines, herbs, non-prescription drugs, or dietary supplements you use. Also tell them if you smoke, drink alcohol, or use illegal drugs. Some items may interact with your medicine. What should I watch for while using this medicine? You may need blood work done while you are taking this medicine. If you are going to need a MRI, CT scan, or other procedure, tell your doctor that you are using this medicine (On-Body Injector only). What side effects may I notice from receiving this medicine? Side effects that you should report to your doctor or health care professional as soon as possible: -allergic reactions like skin rash, itching or hives, swelling of the face, lips, or tongue -dizziness -fever -pain, redness, or irritation at site where  injected -pinpoint red spots on the skin -red or dark-brown urine -shortness of breath or breathing problems -stomach or side pain, or pain at the shoulder -swelling -tiredness -trouble passing urine or change in the amount of urine Side effects that usually do not require medical attention (report to your doctor or health care professional if they continue or are bothersome): -bone pain -muscle pain This list may not describe all possible side effects. Call your doctor for medical advice about side effects. You may report side effects to FDA at 1-800-FDA-1088. Where should I keep my medicine? Keep out of the reach of children. Store pre-filled syringes in a refrigerator between 2 and 8 degrees C (36 and 46 degrees F). Do not freeze. Keep in carton to protect from light. Throw away this medicine if it is left out of the refrigerator for more than 48 hours. Throw away any unused medicine after the expiration date. NOTE: This sheet is a summary. It may not cover all possible information. If you have questions about this medicine, talk to your doctor, pharmacist, or health care provider.  2018 Elsevier/Gold Standard (2016-07-07 12:58:03)

## 2018-01-17 ENCOUNTER — Telehealth: Payer: Self-pay

## 2018-01-17 NOTE — Telephone Encounter (Signed)
Received VM from pt's dad regarding patient having some muscle spasms since chemo yesterday- we have permission to speak with him- tried to call him back but no answer, left him a VM and then called patient's number and she answered.   I let patient know that I reported her muscle spasms to Dr Lindi Adie and he wanted to make sure she drinking enough fluids.  Pt states she is drinking the recommended 64 oz which is 8- 8 oz glasses, but said she will try to increase her fluids, told patient to try at least 8-10 glasses. Said she was a little nauseated and has took compazine as directed. Told her to call office if symptoms worsening, ie not able to take in fluids well, nausea/vomiting, muscle spasms not improving.  Explained the option of symptom management ie receiving IV fluids if needed per Dr Lindi Adie. Pt voiced understanding and no other needs per pt.

## 2018-01-18 ENCOUNTER — Ambulatory Visit: Payer: Commercial Managed Care - PPO

## 2018-01-18 ENCOUNTER — Encounter: Payer: Self-pay | Admitting: *Deleted

## 2018-01-18 ENCOUNTER — Other Ambulatory Visit (HOSPITAL_COMMUNITY): Payer: Commercial Managed Care - PPO

## 2018-01-18 ENCOUNTER — Ambulatory Visit (HOSPITAL_COMMUNITY): Payer: Commercial Managed Care - PPO

## 2018-01-20 ENCOUNTER — Ambulatory Visit (HOSPITAL_COMMUNITY): Payer: Commercial Managed Care - PPO

## 2018-01-22 ENCOUNTER — Other Ambulatory Visit: Payer: Self-pay

## 2018-01-22 ENCOUNTER — Telehealth: Payer: Self-pay | Admitting: Emergency Medicine

## 2018-01-22 ENCOUNTER — Telehealth: Payer: Self-pay

## 2018-01-22 DIAGNOSIS — C50412 Malignant neoplasm of upper-outer quadrant of left female breast: Secondary | ICD-10-CM

## 2018-01-22 DIAGNOSIS — Z170 Estrogen receptor positive status [ER+]: Secondary | ICD-10-CM

## 2018-01-22 NOTE — Telephone Encounter (Signed)
First time chemo follow up call.  Left pt VM telling her to call abck with any questions or concerns.

## 2018-01-22 NOTE — Telephone Encounter (Signed)
-----   Message from Paulla Dolly, RN sent at 01/16/2018 12:48 PM EDT ----- Regarding: 1st chemo - [place] 1st chemo.  AC

## 2018-01-22 NOTE — Telephone Encounter (Signed)
Left message with patient regarding following up after 1st time chemo f/u.

## 2018-01-23 ENCOUNTER — Inpatient Hospital Stay: Payer: Commercial Managed Care - PPO | Attending: Hematology and Oncology

## 2018-01-23 ENCOUNTER — Encounter: Payer: Self-pay | Admitting: Hematology and Oncology

## 2018-01-23 ENCOUNTER — Inpatient Hospital Stay: Payer: Commercial Managed Care - PPO

## 2018-01-23 ENCOUNTER — Inpatient Hospital Stay (HOSPITAL_BASED_OUTPATIENT_CLINIC_OR_DEPARTMENT_OTHER): Payer: Commercial Managed Care - PPO | Admitting: Hematology and Oncology

## 2018-01-23 ENCOUNTER — Encounter: Payer: Self-pay | Admitting: *Deleted

## 2018-01-23 ENCOUNTER — Telehealth: Payer: Self-pay

## 2018-01-23 DIAGNOSIS — Z170 Estrogen receptor positive status [ER+]: Secondary | ICD-10-CM

## 2018-01-23 DIAGNOSIS — Z5111 Encounter for antineoplastic chemotherapy: Secondary | ICD-10-CM | POA: Diagnosis present

## 2018-01-23 DIAGNOSIS — C50412 Malignant neoplasm of upper-outer quadrant of left female breast: Secondary | ICD-10-CM

## 2018-01-23 DIAGNOSIS — C7989 Secondary malignant neoplasm of other specified sites: Secondary | ICD-10-CM

## 2018-01-23 DIAGNOSIS — C773 Secondary and unspecified malignant neoplasm of axilla and upper limb lymph nodes: Secondary | ICD-10-CM

## 2018-01-23 DIAGNOSIS — R110 Nausea: Secondary | ICD-10-CM | POA: Diagnosis not present

## 2018-01-23 LAB — CBC WITH DIFFERENTIAL (CANCER CENTER ONLY)
BASOS ABS: 0 10*3/uL (ref 0.0–0.1)
Basophils Relative: 2 %
Eosinophils Absolute: 0.1 10*3/uL (ref 0.0–0.5)
Eosinophils Relative: 7 %
HEMATOCRIT: 39.3 % (ref 34.8–46.6)
Hemoglobin: 13 g/dL (ref 11.6–15.9)
LYMPHS PCT: 54 %
Lymphs Abs: 0.7 10*3/uL — ABNORMAL LOW (ref 0.9–3.3)
MCH: 29.8 pg (ref 25.1–34.0)
MCHC: 33.1 g/dL (ref 31.5–36.0)
MCV: 90.1 fL (ref 79.5–101.0)
MONO ABS: 0.1 10*3/uL (ref 0.1–0.9)
Monocytes Relative: 8 %
NEUTROS ABS: 0.4 10*3/uL — AB (ref 1.5–6.5)
NEUTROS PCT: 29 %
Platelet Count: 178 10*3/uL (ref 145–400)
RBC: 4.36 MIL/uL (ref 3.70–5.45)
RDW: 12.7 % (ref 11.2–14.5)
WBC: 1.3 10*3/uL — AB (ref 3.9–10.3)

## 2018-01-23 LAB — CMP (CANCER CENTER ONLY)
ALBUMIN: 4.3 g/dL (ref 3.5–5.0)
ALT: 17 U/L (ref 0–44)
AST: 12 U/L — AB (ref 15–41)
Alkaline Phosphatase: 78 U/L (ref 38–126)
Anion gap: 5 (ref 5–15)
BILIRUBIN TOTAL: 0.5 mg/dL (ref 0.3–1.2)
BUN: 10 mg/dL (ref 6–20)
CO2: 30 mmol/L (ref 22–32)
Calcium: 10 mg/dL (ref 8.9–10.3)
Chloride: 103 mmol/L (ref 98–111)
Creatinine: 0.76 mg/dL (ref 0.44–1.00)
GFR, Est AFR Am: >60 mL/min (ref >60)
GLUCOSE: 86 mg/dL (ref 70–99)
Potassium: 4.5 mmol/L (ref 3.5–5.1)
Sodium: 138 mmol/L (ref 135–145)
TOTAL PROTEIN: 7.2 g/dL (ref 6.5–8.1)

## 2018-01-23 NOTE — Progress Notes (Signed)
Patient Care Team: Kristi [name] as PCP - General (Physician Assistant)  DIAGNOSIS:  Encounter Diagnosis  Name Primary?  . Malignant neoplasm of upper-outer quadrant of left breast in female, estrogen receptor positive ([place])     SUMMARY OF ONCOLOGIC HISTORY:   Malignant neoplasm of upper-outer quadrant of left breast in female, estrogen receptor positive ([place])   01/02/2018 Initial Diagnosis    Palpable lump left breast, mammogram: Left breast UOQ 2.3 cm mass, axilla has several enlarged lymph nodes; 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      01/04/2018 Cancer Staging    Staging form: Breast, AJCC 8th Edition - Clinical: Stage IIB (cT2, cN1, cM0, G3, ER+, PR+, HER2: Equivocal) - Signed by Kristi Lose, MD on 01/04/2018      01/16/2018 -  Neo-Adjuvant Chemotherapy    Dose dense Adriamycin and Cytoxan x4 followed by Taxol weekly x12       CHIEF COMPLIANT: Cycle 1 day 8 dose dense Adriamycin and Cytoxan  INTERVAL HISTORY: Kristi [name] is a 42 year old with above-mentioned history of left breast cancer currently on neoadjuvant chemotherapy and today is cycle 1 day 8 of dose dense Adriamycin and Cytoxan.  After the first couple of days of chemo she had flulike symptoms and body aches and pains as result of Neulasta.  She had nausea but Zofran appears to have helped her.  REVIEW OF SYSTEMS:   Constitutional: Denies fevers, chills or abnormal weight loss Eyes: Denies blurriness of vision Ears, nose, mouth, throat, and face: Denies mucositis or sore throat Respiratory: Denies cough, dyspnea or wheezes Cardiovascular: Denies palpitation, chest discomfort Gastrointestinal:  Denies nausea, heartburn or change in bowel habits Skin: Denies abnormal skin rashes Lymphatics: Denies new lymphadenopathy or easy bruising Neurological:Denies numbness, tingling or new  weaknesses Behavioral/Psych: Mood is stable, no new changes  Extremities: No lower extremity edema Breast:  denies any pain or lumps or nodules in either breasts All other systems were reviewed with the patient and are negative.  I have reviewed the past medical history, past surgical history, social history and family history with the patient and they are unchanged from previous note.  ALLERGIES:  is allergic to sulfa antibiotics.  MEDICATIONS:  Current Outpatient Medications  Medication Sig Dispense Refill  . baclofen (LIORESAL) 10 MG tablet Take 10 mg by mouth as needed.    Marland Kitchen buPROPion (WELLBUTRIN) 75 MG tablet Take 75 mg by mouth 2 (two) times daily.    Marland Kitchen dexamethasone (DECADRON) 4 MG tablet Take 1 tablet day after chemotherapy and 1 tablet 2 days after chemotherapy with food 8 tablet 1  . EMGALITY 120 MG/ML SOAJ 120 mg every 30 (thirty) days.    Marland Kitchen lidocaine-prilocaine (EMLA) cream Apply to affected area once 30 g 3  . LORazepam (ATIVAN) 0.5 MG tablet Take 1 tablet (0.5 mg total) by mouth every 6 (six) hours as needed (Nausea or vomiting). 30 tablet 0  . Magnesium 500 MG CAPS Take 500 mg by mouth 2 (two) times daily.    . ondansetron (ZOFRAN) 8 MG tablet Take 1 tablet (8 mg total) by mouth 2 (two) times daily as needed. Start on the third day after chemotherapy. 30 tablet 1  . prochlorperazine (COMPAZINE) 10 MG tablet Take 1 tablet (10 mg total) by mouth every 6 (six) hours as needed (Nausea or vomiting). 30 tablet 1  . riboflavin (VITAMIN B-2) 100 MG  TABS tablet Take 400 mg by mouth 2 (two) times daily.    . rizatriptan (MAXALT) 10 MG tablet Take 10 mg by mouth as needed for migraine. May repeat in 2 hours if needed    . traMADol (ULTRAM) 50 MG tablet Take 1 tablet (50 mg total) by mouth every 6 (six) hours as needed. 10 tablet 0   No current facility-administered medications for this visit.     PHYSICAL EXAMINATION: ECOG PERFORMANCE STATUS: 1 - Symptomatic but completely  ambulatory  Vitals:   01/23/18 1117  BP: 117/87  Pulse: 98  Resp: 20  Temp: 98.8 F (37.1 C)  SpO2: 100%   Filed Weights   01/23/18 1117  Weight: 143 lb 1.6 oz (64.9 kg)    GENERAL:alert, no distress and comfortable SKIN: skin color, texture, turgor are normal, no rashes or significant lesions EYES: normal, Conjunctiva are pink and non-injected, sclera clear OROPHARYNX:no exudate, no erythema and lips, buccal mucosa, and tongue normal  NECK: supple, thyroid normal size, non-tender, without nodularity LYMPH:  no palpable lymphadenopathy in the cervical, axillary or inguinal LUNGS: clear to auscultation and percussion with normal breathing effort HEART: regular rate & rhythm and no murmurs and no lower extremity edema ABDOMEN:abdomen soft, non-tender and normal bowel sounds MUSCULOSKELETAL:no cyanosis of digits and no clubbing  NEURO: alert & oriented x 3 with fluent speech, no focal motor/sensory deficits EXTREMITIES: No lower extremity edema  LABORATORY DATA:  I have reviewed the data as listed CMP Latest Ref Rng & Units 01/23/2018 01/16/2018 01/09/2018  Glucose 70 - 99 mg/dL 86 101(H) 82  BUN 6 - 20 mg/dL 10 6 10   Creatinine 0.44 - 1.00 mg/dL 0.76 0.74 0.81  Sodium 135 - 145 mmol/L 138 134(L) 140  Potassium 3.5 - 5.1 mmol/L 4.5 3.2(L) 4.1  Chloride 98 - 111 mmol/L 103 103 105  CO2 22 - 32 mmol/L 30 21(L) 25  Calcium 8.9 - 10.3 mg/dL 10.0 8.9 9.3  Total Protein 6.5 - 8.1 g/dL 7.2 7.0 7.5  Total Bilirubin 0.3 - 1.2 mg/dL 0.5 0.4 0.4  Alkaline Phos 38 - 126 U/L 78 50 50  AST 15 - 41 U/L 12(L) 14(L) 14  ALT 0 - 44 U/L 17 11 11     Lab Results  Component Value Date   WBC 1.3 (L) 01/23/2018   HGB 13.0 01/23/2018   HCT 39.3 01/23/2018   MCV 90.1 01/23/2018   PLT 178 01/23/2018   NEUTROABS 0.4 (LL) 01/23/2018    ASSESSMENT & PLAN:  Malignant neoplasm of upper-outer quadrant of left breast in female, estrogen receptor positive ([place]) 01/02/2018: Palpable lump left breast  since December 2018, mammogram: Left breast UOQ 2.3 cm mass, axilla has several enlarged lymph nodes; 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  Recommendation: 1. Neoadjuvant chemotherapy with dose dense Adriamycin and Cytoxan followed by Taxol weekly x12 2. Followed by surgery (which could be mastectomy) with AXLND versus TAD 3. Followed by radiation 4. Followed by antiestrogen therapy  Review of scans: No evidence of metastatic disease by CT scans and bone scan. Breast MRI 01/10/2018: Necrotic mass 2.4 x 3.2 x 4 cm, 4 abnormal lymph nodes in the axilla  Current treatment: Cycle 1 day 8 dose dense Adriamycin and Cytoxan  Chemo toxicities: 1.  Nausea: Improved with Zofran  2. flulike symptoms due to Neulasta currently on Claritin-D 3.  ANC 0.4: Decreasing the dosage  of cycle 2 of chemo   RTC in 1 week for cycle 2  No orders of the defined types were placed in this encounter.  The patient has a good understanding of the overall plan. she agrees with it. she will call with any problems that may develop before the next visit here.   Harriette Ohara, MD 01/23/18

## 2018-01-23 NOTE — Progress Notes (Signed)
Patient came in per my request for registration.  Introduced myself as Arboriculturist and to see if she had any financial questions or concerns. Patient states not yet and she has not received any bills yet. Asked if she knows where she stands as far as deductible/OOP. She states she thinks she has met everything. Advised patient that her insurance then may be covering at 100% and may not need copay asistance.She verbalized understanding and asked if once she receives bills, payment plans be available. Advised her once she receives the bills to contact the number on the bill to discuss arrangements.  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 Gave patient verbal income guideline. She states right now she exceeds that and is still working. She states once she has surgery she will not be able to work. Gave her my card to contact me once her income reduces and we may be able to revisit depending upon her treatment after surgery. She verbalized understanding and had no additional questions.

## 2018-01-23 NOTE — Assessment & Plan Note (Signed)
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  Recommendation: 1. Neoadjuvant chemotherapy with dose dense Adriamycin and Cytoxan followed by Taxol weekly x12 2. Followed by surgery (which could be mastectomy) with AXLND versus TAD 3. Followed by radiation 4. Followed by antiestrogen therapy  Review of scans: No evidence of metastatic disease by CT scans and bone scan. Breast MRI 01/10/2018: Necrotic mass 2.4 x 3.2 x 4 cm, 4 abnormal lymph nodes in the axilla  Current treatment: Cycle 1 day 8 dose dense Adriamycin and Cytoxan  Chemo toxicities:  RTC in 1 week for cycle 2

## 2018-01-23 NOTE — Telephone Encounter (Signed)
Linda with lab called with critical [place] level of 0.4.  Notified Dr Lindi Adie. No new orders at this time.

## 2018-01-29 ENCOUNTER — Telehealth: Payer: Self-pay | Admitting: Genetic Counselor

## 2018-01-29 ENCOUNTER — Encounter: Payer: Self-pay | Admitting: Genetic Counselor

## 2018-01-29 DIAGNOSIS — Z1379 Encounter for other screening for genetic and chromosomal anomalies: Secondary | ICD-10-CM | POA: Insufficient documentation

## 2018-01-29 NOTE — Telephone Encounter (Signed)
Revealed negative genetic testing.  Discussed that we do not know why she has breast cancer or why there is cancer in the family. It could be due to a different gene that we are not testing, or maybe our current technology may not be able to pick something up.  It will be important for her to keep in contact with genetics to keep up with whether additional testing may be needed.  Revealed SKT11 VUS.  Discussed that this is still a normal result.  Her sisters need to undergo screening starting 10 years younger than her age of onset.

## 2018-01-30 ENCOUNTER — Ambulatory Visit: Payer: Commercial Managed Care - PPO

## 2018-01-30 ENCOUNTER — Other Ambulatory Visit: Payer: Commercial Managed Care - PPO

## 2018-01-30 ENCOUNTER — Ambulatory Visit: Payer: Commercial Managed Care - PPO | Admitting: Hematology and Oncology

## 2018-01-31 ENCOUNTER — Ambulatory Visit: Payer: Self-pay | Admitting: Genetic Counselor

## 2018-01-31 DIAGNOSIS — Z1379 Encounter for other screening for genetic and chromosomal anomalies: Secondary | ICD-10-CM

## 2018-01-31 DIAGNOSIS — Z170 Estrogen receptor positive status [ER+]: Secondary | ICD-10-CM

## 2018-01-31 DIAGNOSIS — C50412 Malignant neoplasm of upper-outer quadrant of left female breast: Secondary | ICD-10-CM

## 2018-01-31 NOTE — Assessment & Plan Note (Addendum)
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  Recommendation: 1. Neoadjuvant chemotherapy with dose dense Adriamycin and Cytoxan followed by Taxol weekly x12 2. Followed by surgery (which could be mastectomy) with AXLND versus TAD 3. Followed by radiation 4. Followed by antiestrogen therapy  Review of scans: No evidence of metastatic disease by CT scans and bone scan. Breast MRI 01/10/2018: Necrotic mass 2.4 x 3.2 x 4 cm, 4 abnormal lymph nodes in the axilla  Current treatment: Cycle 2 day 1 dose dense Adriamycin and Cytoxan  Chemo toxicities: 1.  Nausea: Improved with Zofran  2. flulike symptoms due to Neulasta currently on Claritin-D 3.  ANC 0.4: Decreasing the dosage of cycle 2 of chemo     RTC in 2 weeks for cycle 3

## 2018-01-31 NOTE — Progress Notes (Signed)
HPI:  Ms. Rae was previously seen in the [facility] due to a personal and family history of cancer and concerns regarding a hereditary predisposition to cancer. Please refer to our prior cancer genetics clinic note for more information regarding Ms. Direnzo's medical, social and family histories, and our assessment and recommendations, at the time. Ms. Lipari's recent genetic test results were disclosed to her, as were recommendations warranted by these results. These results and recommendations are discussed in more detail below.  CANCER HISTORY:    Malignant neoplasm of upper-outer quadrant of left breast in female, estrogen receptor positive ([place])   01/02/2018 Initial Diagnosis    Palpable lump left breast, mammogram: Left breast UOQ 2.3 cm mass, axilla has several enlarged lymph nodes; 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      01/04/2018 Cancer Staging    Staging form: Breast, AJCC 8th Edition - Clinical: Stage IIB (cT2, cN1, cM0, G3, ER+, PR+, HER2: Equivocal) - Signed by Nicholas Lose, MD on 01/04/2018      01/16/2018 -  Neo-Adjuvant Chemotherapy    Dose dense Adriamycin and Cytoxan x4 followed by Taxol weekly x12      01/19/2018 Genetic Testing    STK11 c.608C>T VUS identified on the Multicancer panel.  The Multi-Gene Panel offered by [facility] includes sequencing and/or deletion duplication testing of the following 83 genes: ALK, APC, ATM, AXIN2,BAP1,  BARD1, BLM, BMPR1A, BRCA1, BRCA2, BRIP1, CASR, CDC73, CDH1, CDK4, CDKN1B, CDKN1C, CDKN2A (p14ARF), CDKN2A (p16INK4a), CEBPA, CHEK2, CTNNA1, DICER1, DIS3L2, EGFR (c.2369C>T, p.Thr790Met variant only), EPCAM (Deletion/duplication testing only), FH, FLCN, GATA2, GPC3, GREM1 (Promoter region deletion/duplication testing only), HOXB13 (c.251G>A, p.Gly84Glu), HRAS, KIT, MAX, MEN1, MET, MITF (c.952G>A,  p.Glu318Lys variant only), MLH1, MSH2, MSH3, MSH6, MUTYH, NBN, NF1, NF2, NTHL1, PALB2, PDGFRA, PHOX2B, PMS2, POLD1, POLE, POT1, PRKAR1A, PTCH1, PTEN, RAD50, RAD51C, RAD51D, RB1, RECQL4, RET, RUNX1, SDHAF2, SDHA (sequence changes only), SDHB, SDHC, SDHD, SMAD4, SMARCA4, SMARCB1, SMARCE1, STK11, SUFU, TERT, TERT, TMEM127, TP53, TSC1, TSC2, VHL, WRN and WT1.  The report date is January 19, 2018.       FAMILY HISTORY:  We obtained a detailed, 4-generation family history.  Significant diagnoses are listed below: Family History  Problem Relation Age of Onset  . Prostate cancer Father 32       Stage 1  . Ovarian cancer Maternal Aunt 19       ? Germ cell?  . Prostate cancer Maternal Uncle 65       prostectomy  . Pancreatic cancer Maternal Grandmother 7  . Colon cancer Maternal Grandfather 24  . Heart attack Paternal Grandmother   . Prostate cancer Paternal Grandfather        dx in his 50s, d. 65s-90s  . Breast cancer Other        MGMs sister dx over 51    The patient does not have children.  She has three sisters and a brother who are all younger and cancer free.  Her parents are both living in their 45's.  The patient's father was diagnosed with Stage 1 prostate cancer at 65.  He has three sisters and a brother who are call cancer free. His father also had prostate cancer and died in his 57's-90's.  His mother died from heart failure.  The patient's mother does not have cancer.  She has two sisters and three brothers.  One sister had ovarian  cancer at 27-42 years of age.  One brother was recently diagnosed with prostate cancer and underwent a proctectomy at age 35.  The maternal grandfather died of colon cancer at 26 and the grandmother was diagnosed early with pancreatic cancer at 47 and is now 42.  She had a sister with breast cancer dx over age 75.  Ms. Hopson is unaware of previous family history of genetic testing for hereditary cancer risks. Patient's maternal ancestors are of [place]  descent, and paternal ancestors are of [place] and [place] descent. There is no reported Ashkenazi Jewish ancestry. There is no known consanguinity.  GENETIC TEST RESULTS: Genetic testing reported out on January 19, 2018 through the Multi-cancer panel found no deleterious mutations.  The Multi-Gene Panel offered by [facility] includes sequencing and/or deletion duplication testing of the following 83 genes: ALK, APC, ATM, AXIN2,BAP1,  BARD1, BLM, BMPR1A, BRCA1, BRCA2, BRIP1, CASR, CDC73, CDH1, CDK4, CDKN1B, CDKN1C, CDKN2A (p14ARF), CDKN2A (p16INK4a), CEBPA, CHEK2, CTNNA1, DICER1, DIS3L2, EGFR (c.2369C>T, p.Thr790Met variant only), EPCAM (Deletion/duplication testing only), FH, FLCN, GATA2, GPC3, GREM1 (Promoter region deletion/duplication testing only), HOXB13 (c.251G>A, p.Gly84Glu), HRAS, KIT, MAX, MEN1, MET, MITF (c.952G>A, p.Glu318Lys variant only), MLH1, MSH2, MSH3, MSH6, MUTYH, NBN, NF1, NF2, NTHL1, PALB2, PDGFRA, PHOX2B, PMS2, POLD1, POLE, POT1, PRKAR1A, PTCH1, PTEN, RAD50, RAD51C, RAD51D, RB1, RECQL4, RET, RUNX1, SDHAF2, SDHA (sequence changes only), SDHB, SDHC, SDHD, SMAD4, SMARCA4, SMARCB1, SMARCE1, STK11, SUFU, TERT, TERT, TMEM127, TP53, TSC1, TSC2, VHL, WRN and WT1.  The test report has been scanned into EPIC and is located under the Molecular Pathology section of the Results Review tab.    We discussed with Ms. Ulbrich that since the current genetic testing is not perfect, it is possible there may be a gene mutation in one of these genes that current testing cannot detect, but that chance is small.  We also discussed, that it is possible that another gene that has not yet been discovered, or that we have not yet tested, is responsible for the cancer diagnoses in the family, and it is, therefore, important to remain in touch with cancer genetics in the future so that we can continue to offer Ms. Hoglund the most up to date genetic testing.   Genetic testing did detect a Variant of Unknown Significance  in the STK11 gene called c.608C>T (p.Pro203Leu). At this time, it is unknown if this variant is associated with increased cancer risk or if this is a normal finding, but most variants such as this get reclassified to being inconsequential. It should not be used to make medical management decisions. With time, we suspect the lab will determine the significance of this variant, if any. If we do learn more about it, we will try to contact Ms. Vanessen to discuss it further. However, it is important to stay in touch with [place] periodically and keep the address and phone number up to date.  CANCER SCREENING RECOMMENDATIONS: This result is reassuring and indicates that Ms. Sans likely does not have an increased risk for a future cancer due to a mutation in one of these genes. This normal test also suggests that Ms. Hitz's cancer was most likely not due to an inherited predisposition associated with one of these genes.  Most cancers happen by chance and this negative test suggests that her cancer falls into this category.  We, therefore, recommended she continue to follow the cancer management and screening guidelines provided by her oncology and primary healthcare provider.   An individual's cancer risk and medical management are  not determined by genetic test results alone. Overall cancer risk assessment incorporates additional factors, including personal medical history, family history, and any available genetic information that may result in a personalized plan for cancer prevention and surveillance.  RECOMMENDATIONS FOR FAMILY MEMBERS:  Women in this family might be at some increased risk of developing cancer, over the general population risk, simply due to the family history of cancer.  We recommended women in this family have a yearly mammogram beginning at age 58, or 57 years younger than the earliest onset of cancer, an annual clinical breast exam, and perform monthly breast self-exams. Women in this  family should also have a gynecological exam as recommended by their primary provider. All family members should have a colonoscopy by age 42.  FOLLOW-UP: Lastly, we discussed with Ms. Oshea that cancer genetics is a rapidly advancing field and it is possible that new genetic tests will be appropriate for her and/or her family members in the future. We encouraged her to remain in contact with cancer genetics on an annual basis so we can update her personal and family histories and let her know of advances in cancer genetics that may benefit this family.   Our contact number was provided. Ms. Dileonardo's questions were answered to her satisfaction, and she knows she is welcome to call us at anytime with additional questions or concerns.   Roma Kayser, MS, [facility] Certified Genetic Counselor [email or web address]

## 2018-02-01 ENCOUNTER — Inpatient Hospital Stay: Payer: Commercial Managed Care - PPO

## 2018-02-01 ENCOUNTER — Inpatient Hospital Stay (HOSPITAL_BASED_OUTPATIENT_CLINIC_OR_DEPARTMENT_OTHER): Payer: Commercial Managed Care - PPO | Admitting: Hematology and Oncology

## 2018-02-01 ENCOUNTER — Encounter: Payer: Self-pay | Admitting: *Deleted

## 2018-02-01 ENCOUNTER — Ambulatory Visit: Payer: Commercial Managed Care - PPO

## 2018-02-01 DIAGNOSIS — C7989 Secondary malignant neoplasm of other specified sites: Secondary | ICD-10-CM | POA: Diagnosis not present

## 2018-02-01 DIAGNOSIS — C50412 Malignant neoplasm of upper-outer quadrant of left female breast: Secondary | ICD-10-CM

## 2018-02-01 DIAGNOSIS — Z170 Estrogen receptor positive status [ER+]: Secondary | ICD-10-CM

## 2018-02-01 DIAGNOSIS — C773 Secondary and unspecified malignant neoplasm of axilla and upper limb lymph nodes: Secondary | ICD-10-CM

## 2018-02-01 DIAGNOSIS — Z5111 Encounter for antineoplastic chemotherapy: Secondary | ICD-10-CM | POA: Diagnosis not present

## 2018-02-01 DIAGNOSIS — Z95828 Presence of other vascular implants and grafts: Secondary | ICD-10-CM

## 2018-02-01 LAB — CBC WITH DIFFERENTIAL (CANCER CENTER ONLY)
Basophils Absolute: 0 10*3/uL (ref 0.0–0.1)
Basophils Relative: 0 %
EOS PCT: 0 %
Eosinophils Absolute: 0 10*3/uL (ref 0.0–0.5)
HEMATOCRIT: 40.8 % (ref 34.8–46.6)
Hemoglobin: 13.6 g/dL (ref 11.6–15.9)
LYMPHS ABS: 1.5 10*3/uL (ref 0.9–3.3)
LYMPHS PCT: 16 %
MCH: 29.7 pg (ref 25.1–34.0)
MCHC: 33.3 g/dL (ref 31.5–36.0)
MCV: 89.1 fL (ref 79.5–101.0)
Monocytes Absolute: 0.4 10*3/uL (ref 0.1–0.9)
Monocytes Relative: 4 %
Neutro Abs: 7.1 10*3/uL — ABNORMAL HIGH (ref 1.5–6.5)
Neutrophils Relative %: 80 %
Platelet Count: 341 10*3/uL (ref 145–400)
RBC: 4.58 MIL/uL (ref 3.70–5.45)
RDW: 13 % (ref 11.2–14.5)
WBC Count: 9 10*3/uL (ref 3.9–10.3)

## 2018-02-01 LAB — CMP (CANCER CENTER ONLY)
ALBUMIN: 4.4 g/dL (ref 3.5–5.0)
ALT: 30 U/L (ref 0–44)
AST: 18 U/L (ref 15–41)
Alkaline Phosphatase: 73 U/L (ref 38–126)
Anion gap: 9 (ref 5–15)
BUN: 10 mg/dL (ref 6–20)
CO2: 27 mmol/L (ref 22–32)
Calcium: 9.4 mg/dL (ref 8.9–10.3)
Chloride: 106 mmol/L (ref 98–111)
Creatinine: 0.84 mg/dL (ref 0.44–1.00)
GFR, Est AFR Am: >60 mL/min (ref >60)
GFR, Estimated: >60 mL/min (ref >60)
GLUCOSE: 109 mg/dL — AB (ref 70–99)
POTASSIUM: 3.9 mmol/L (ref 3.5–5.1)
SODIUM: 142 mmol/L (ref 135–145)
Total Bilirubin: 0.2 mg/dL — ABNORMAL LOW (ref 0.3–1.2)
Total Protein: 7.2 g/dL (ref 6.5–8.1)

## 2018-02-01 MED ORDER — SODIUM CHLORIDE 0.9 % IV SOLN
500.0000 mg/m2 | Freq: Once | INTRAVENOUS | Status: AC
  Administered 2018-02-01: 880 mg via INTRAVENOUS
  Filled 2018-02-01: qty 44

## 2018-02-01 MED ORDER — HEPARIN SOD (PORK) LOCK FLUSH 100 UNIT/ML IV SOLN
500.0000 [IU] | Freq: Once | INTRAVENOUS | Status: AC | PRN
  Administered 2018-02-01: 500 [IU]
  Filled 2018-02-01: qty 5

## 2018-02-01 MED ORDER — SODIUM CHLORIDE 0.9% FLUSH
10.0000 mL | INTRAVENOUS | Status: DC | PRN
  Administered 2018-02-01: 10 mL
  Filled 2018-02-01: qty 10

## 2018-02-01 MED ORDER — SODIUM CHLORIDE 0.9 % IV SOLN
Freq: Once | INTRAVENOUS | Status: AC
  Administered 2018-02-01: 09:00:00 via INTRAVENOUS

## 2018-02-01 MED ORDER — PEGFILGRASTIM 6 MG/0.6ML ~~LOC~~ PSKT
PREFILLED_SYRINGE | SUBCUTANEOUS | Status: AC
  Filled 2018-02-01: qty 0.6

## 2018-02-01 MED ORDER — PEGFILGRASTIM 6 MG/0.6ML ~~LOC~~ PSKT
6.0000 mg | PREFILLED_SYRINGE | Freq: Once | SUBCUTANEOUS | Status: AC
  Administered 2018-02-01: 6 mg via SUBCUTANEOUS

## 2018-02-01 MED ORDER — PALONOSETRON HCL INJECTION 0.25 MG/5ML
0.2500 mg | Freq: Once | INTRAVENOUS | Status: AC
  Administered 2018-02-01: 0.25 mg via INTRAVENOUS

## 2018-02-01 MED ORDER — DOXORUBICIN HCL CHEMO IV INJECTION 2 MG/ML
50.0000 mg/m2 | Freq: Once | INTRAVENOUS | Status: AC
  Administered 2018-02-01: 88 mg via INTRAVENOUS
  Filled 2018-02-01: qty 44

## 2018-02-01 MED ORDER — FOSAPREPITANT DIMEGLUMINE INJECTION 150 MG
Freq: Once | INTRAVENOUS | Status: AC
  Administered 2018-02-01: 10:00:00 via INTRAVENOUS
  Filled 2018-02-01: qty 5

## 2018-02-01 MED ORDER — SODIUM CHLORIDE 0.9% FLUSH
10.0000 mL | INTRAVENOUS | Status: DC | PRN
  Administered 2018-02-01: 10 mL via INTRAVENOUS
  Filled 2018-02-01: qty 10

## 2018-02-01 MED ORDER — PALONOSETRON HCL INJECTION 0.25 MG/5ML
INTRAVENOUS | Status: AC
  Filled 2018-02-01: qty 5

## 2018-02-01 NOTE — Progress Notes (Signed)
Patient Care Team: [name] as PCP - General (Physician Assistant)  DIAGNOSIS:  Encounter Diagnosis  Name Primary?  . Malignant neoplasm of upper-outer quadrant of left breast in female, estrogen receptor positive ([place])     SUMMARY OF ONCOLOGIC HISTORY:   Malignant neoplasm of upper-outer quadrant of left breast in female, estrogen receptor positive ([place])   01/02/2018 Initial Diagnosis    Palpable lump left breast, mammogram: Left breast UOQ 2.3 cm mass, axilla has several enlarged lymph nodes; 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      01/04/2018 Cancer Staging    Staging form: Breast, AJCC 8th Edition - Clinical: Stage IIB (cT2, cN1, cM0, G3, ER+, PR+, HER2: Equivocal) - Signed by Nicholas Lose, MD on 01/04/2018      01/16/2018 -  Neo-Adjuvant Chemotherapy    Dose dense Adriamycin and Cytoxan x4 followed by Taxol weekly x12      01/19/2018 Genetic Testing    STK11 c.608C>T VUS identified on the Multicancer panel.  The Multi-Gene Panel offered by [facility] includes sequencing and/or deletion duplication testing of the following 83 genes: ALK, APC, ATM, AXIN2,BAP1,  BARD1, BLM, BMPR1A, BRCA1, BRCA2, BRIP1, CASR, CDC73, CDH1, CDK4, CDKN1B, CDKN1C, CDKN2A (p14ARF), CDKN2A (p16INK4a), CEBPA, CHEK2, CTNNA1, DICER1, DIS3L2, EGFR (c.2369C>T, p.Thr790Met variant only), EPCAM (Deletion/duplication testing only), FH, FLCN, GATA2, GPC3, GREM1 (Promoter region deletion/duplication testing only), HOXB13 (c.251G>A, p.Gly84Glu), HRAS, KIT, MAX, MEN1, MET, MITF (c.952G>A, p.Glu318Lys variant only), MLH1, MSH2, MSH3, MSH6, MUTYH, NBN, NF1, NF2, NTHL1, PALB2, PDGFRA, PHOX2B, PMS2, POLD1, POLE, POT1, PRKAR1A, PTCH1, PTEN, RAD50, RAD51C, RAD51D, RB1, RECQL4, RET, RUNX1, SDHAF2, SDHA (sequence changes only), SDHB, SDHC, SDHD, SMAD4, SMARCA4, SMARCB1, SMARCE1, STK11, SUFU, TERT, TERT, TMEM127,  TP53, TSC1, TSC2, VHL, WRN and WT1.  The report date is January 19, 2018.       CHIEF COMPLIANT: Cycle 2 dose dense Adriamycin and Cytoxan  INTERVAL HISTORY: Kristi [name] is a 42 year old with above-mentioned history of left breast cancer who is currently on neoadjuvant chemotherapy and today's cycle 2 of dose dense Adriamycin and Cytoxan.  She tolerated cycle 1 extremely well.  She does not have any further issues with nausea.  She did have fatigue.  She had mild constipation after cycle 1.  REVIEW OF SYSTEMS:   Constitutional: Denies fevers, chills or abnormal weight loss, complains of fatigue Eyes: Denies blurriness of vision Ears, nose, mouth, throat, and face: Denies mucositis or sore throat Respiratory: Denies cough, dyspnea or wheezes Cardiovascular: Denies palpitation, chest discomfort Gastrointestinal:  Denies nausea, heartburn or change in bowel habits Skin: Denies abnormal skin rashes Lymphatics: Denies new lymphadenopathy or easy bruising Neurological:Denies numbness, tingling or new weaknesses Behavioral/Psych: Mood is stable, no new changes  Extremities: No lower extremity edema  All other systems were reviewed with the patient and are negative.  I have reviewed the past medical history, past surgical history, social history and family history with the patient and they are unchanged from previous note.  ALLERGIES:  is allergic to sulfa antibiotics.  MEDICATIONS:  Current Outpatient Medications  Medication Sig Dispense Refill  . baclofen (LIORESAL) 10 MG tablet Take 10 mg by mouth as needed.    Marland Kitchen buPROPion (WELLBUTRIN) 75 MG tablet Take 75 mg by mouth 2 (two) times daily.    Marland Kitchen dexamethasone (DECADRON) 4 MG tablet Take 1 tablet day after chemotherapy and 1 tablet 2 days after chemotherapy with food  8 tablet 1  . EMGALITY 120 MG/ML SOAJ 120 mg every 30 (thirty) days.    Marland Kitchen lidocaine-prilocaine (EMLA) cream Apply to affected area once 30 g 3  . LORazepam (ATIVAN) 0.5  MG tablet Take 1 tablet (0.5 mg total) by mouth every 6 (six) hours as needed (Nausea or vomiting). 30 tablet 0  . Magnesium 500 MG CAPS Take 500 mg by mouth 2 (two) times daily.    . ondansetron (ZOFRAN) 8 MG tablet Take 1 tablet (8 mg total) by mouth 2 (two) times daily as needed. Start on the third day after chemotherapy. 30 tablet 1  . prochlorperazine (COMPAZINE) 10 MG tablet Take 1 tablet (10 mg total) by mouth every 6 (six) hours as needed (Nausea or vomiting). 30 tablet 1  . riboflavin (VITAMIN B-2) 100 MG TABS tablet Take 400 mg by mouth 2 (two) times daily.    . rizatriptan (MAXALT) 10 MG tablet Take 10 mg by mouth as needed for migraine. May repeat in 2 hours if needed    . traMADol (ULTRAM) 50 MG tablet Take 1 tablet (50 mg total) by mouth every 6 (six) hours as needed. 10 tablet 0   No current facility-administered medications for this visit.     PHYSICAL EXAMINATION: ECOG PERFORMANCE STATUS: 1 - Symptomatic but completely ambulatory  Vitals:   02/01/18 0846  BP: 127/86  Pulse: 99  Resp: 18  Temp: 98.4 F (36.9 C)  SpO2: 100%   Filed Weights   02/01/18 0846  Weight: 144 lb 14.4 oz (65.7 kg)    GENERAL:alert, no distress and comfortable SKIN: skin color, texture, turgor are normal, no rashes or significant lesions EYES: normal, Conjunctiva are pink and non-injected, sclera clear OROPHARYNX:no exudate, no erythema and lips, buccal mucosa, and tongue normal  NECK: supple, thyroid normal size, non-tender, without nodularity LYMPH:  no palpable lymphadenopathy in the cervical, axillary or inguinal LUNGS: clear to auscultation and percussion with normal breathing effort HEART: regular rate & rhythm and no murmurs and no lower extremity edema ABDOMEN:abdomen soft, non-tender and normal bowel sounds MUSCULOSKELETAL:no cyanosis of digits and no clubbing  NEURO: alert & oriented x 3 with fluent speech, no focal motor/sensory deficits EXTREMITIES: No lower extremity edema     LABORATORY DATA:  I have reviewed the data as listed CMP Latest Ref Rng & Units 01/23/2018 01/16/2018 01/09/2018  Glucose 70 - 99 mg/dL 86 101(H) 82  BUN 6 - 20 mg/dL _0 Creatinine 0.44 - 1.00 mg/dL 0.76 0.74 0.81  Sodium 135 - 145 mmol/L 138 134(L) 140  Potassium 3.5 - 5.1 mmol/L 4.5 3.2(L) 4.1  Chloride 98 - 111 mmol/L 103 103 105  CO2 22 - 32 mmol/L 30 21(L) 25  Calcium 8.9 - 10.3 mg/dL 10.0 8.9 9.3  Total Protein 6.5 - 8.1 g/dL 7.2 7.0 7.5  Total Bilirubin 0.3 - 1.2 mg/dL 0.5 0.4 0.4  Alkaline Phos 38 - 126 U/L 78 50 50  AST 15 - 41 U/L 12(L) 14(L) 14  ALT 0 - 44 U/L _1 Lab Results  Component Value Date   WBC 9.0 02/01/2018   HGB 13.6 02/01/2018   HCT 40.8 02/01/2018   MCV 89.1 02/01/2018   PLT 341 02/01/2018   NEUTROABS 7.1 (H) 02/01/2018    ASSESSMENT & PLAN:  Malignant neoplasm of upper-outer quadrant of left breast in female, estrogen receptor positive ([place]) 01/02/2018: Palpable lump left breast since December 2018, mammogram: Left breast UOQ 2.3 cm  mass, axilla has several enlarged lymph nodes; 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  Recommendation: 1. Neoadjuvant chemotherapy with dose dense Adriamycin and Cytoxan followed by Taxol weekly x12 2. Followed by surgery (which could be mastectomy) with AXLND versus TAD 3. Followed by radiation 4. Followed by antiestrogen therapy  Review of scans: No evidence of metastatic disease by CT scans and bone scan. Breast MRI 01/10/2018: Necrotic mass 2.4 x 3.2 x 4 cm, 4 abnormal lymph nodes in the axilla  Current treatment: Cycle 2 day 1 dose dense Adriamycin and Cytoxan  Chemo toxicities: 1.  Nausea: Improved with Zofran  2. flulike symptoms due to Neulasta currently on Claritin-D 3.  ANC 0.4: Decreasing the dosage of cycle 2 of chemo     RTC in 2 weeks for cycle 3       No orders of the defined  types were placed in this encounter.  The patient has a good understanding of the overall plan. she agrees with it. she will call with any problems that may develop before the next visit here.   Harriette Ohara, MD 02/01/18

## 2018-02-01 NOTE — Patient Instructions (Signed)
[facility] Discharge Instructions for Patients Receiving Chemotherapy  Today you received the following chemotherapy agents:  Adriamycin, Cytoxan  To help prevent nausea and vomiting after your treatment, we encourage you to take your nausea medication as prescribed.   If you develop nausea and vomiting that is not controlled by your nausea medication, call the clinic.   BELOW ARE SYMPTOMS THAT SHOULD BE REPORTED IMMEDIATELY:  *FEVER GREATER THAN 100.5 F  *CHILLS WITH OR WITHOUT FEVER  NAUSEA AND VOMITING THAT IS NOT CONTROLLED WITH YOUR NAUSEA MEDICATION  *UNUSUAL SHORTNESS OF BREATH  *UNUSUAL BRUISING OR BLEEDING  TENDERNESS IN MOUTH AND THROAT WITH OR WITHOUT PRESENCE OF ULCERS  *URINARY PROBLEMS  *BOWEL PROBLEMS  UNUSUAL RASH Items with * indicate a potential emergency and should be followed up as soon as possible.  Feel free to call the clinic should you have any questions or concerns. The clinic phone number is [phone].  Please show the [place] at check-in to the Emergency Department and triage nurse.    Cyclophosphamide injection What is this medicine? CYCLOPHOSPHAMIDE (sye kloe FOSS fa mide) is a chemotherapy drug. It slows the growth of cancer cells. This medicine is used to treat many types of cancer like lymphoma, myeloma, leukemia, breast cancer, and ovarian cancer, to name a few. This medicine may be used for other purposes; ask your health care provider or pharmacist if you have questions. COMMON BRAND NAME(S): Cytoxan, Neosar What should I tell my health care provider before I take this medicine? They need to know if you have any of these conditions: -blood disorders -history of other chemotherapy -infection -kidney disease -liver disease -recent or ongoing radiation therapy -tumors in the bone marrow -an unusual or allergic reaction to cyclophosphamide, other chemotherapy, other medicines, foods, dyes, or  preservatives -pregnant or trying to get pregnant -breast-feeding How should I use this medicine? This drug is usually given as an injection into a vein or muscle or by infusion into a vein. It is administered in a hospital or clinic by a specially trained health care professional. Talk to your pediatrician regarding the use of this medicine in children. Special care may be needed. Overdosage: If you think you have taken too much of this medicine contact a poison control center or emergency room at once. NOTE: This medicine is only for you. Do not share this medicine with others. What if I miss a dose? It is important not to miss your dose. Call your doctor or health care professional if you are unable to keep an appointment. What may interact with this medicine? This medicine may interact with the following medications: -amiodarone -amphotericin B -azathioprine -certain antiviral medicines for HIV or AIDS such as protease inhibitors (e.g., indinavir, ritonavir) and zidovudine -certain blood pressure medications such as benazepril, captopril, enalapril, fosinopril, lisinopril, moexipril, monopril, perindopril, quinapril, ramipril, trandolapril -certain cancer medications such as anthracyclines (e.g., daunorubicin, doxorubicin), busulfan, cytarabine, paclitaxel, pentostatin, tamoxifen, trastuzumab -certain diuretics such as chlorothiazide, chlorthalidone, hydrochlorothiazide, indapamide, metolazone -certain medicines that treat or prevent blood clots like warfarin -certain muscle relaxants such as succinylcholine -cyclosporine -etanercept -indomethacin -medicines to increase blood counts like filgrastim, pegfilgrastim, sargramostim -medicines used as general anesthesia -metronidazole -natalizumab This list may not describe all possible interactions. Give your health care provider a list of all the medicines, herbs, non-prescription drugs, or dietary supplements you use. Also tell them if  you smoke, drink alcohol, or use illegal drugs. Some items may interact with your medicine. What should  I watch for while using this medicine? Visit your doctor for checks on your progress. This drug may make you feel generally unwell. This is not uncommon, as chemotherapy can affect healthy cells as well as cancer cells. Report any side effects. Continue your course of treatment even though you feel ill unless your doctor tells you to stop. Drink water or other fluids as directed. Urinate often, even at night. In some cases, you may be given additional medicines to help with side effects. Follow all directions for their use. Call your doctor or health care professional for advice if you get a fever, chills or sore throat, or other symptoms of a cold or flu. Do not treat yourself. This drug decreases your body's ability to fight infections. Try to avoid being around people who are sick. This medicine may increase your risk to bruise or bleed. Call your doctor or health care professional if you notice any unusual bleeding. Be careful brushing and flossing your teeth or using a toothpick because you may get an infection or bleed more easily. If you have any dental work done, tell your dentist you are receiving this medicine. You may get drowsy or dizzy. Do not drive, use machinery, or do anything that needs mental alertness until you know how this medicine affects you. Do not become pregnant while taking this medicine or for 1 year after stopping it. Women should inform their doctor if they wish to become pregnant or think they might be pregnant. Men should not father a child while taking this medicine and for 4 months after stopping it. There is a potential for serious side effects to an unborn child. Talk to your health care professional or pharmacist for more information. Do not breast-feed an infant while taking this medicine. This medicine may interfere with the ability to have a child. This medicine  has caused ovarian failure in some women. This medicine has caused reduced sperm counts in some men. You should talk with your doctor or health care professional if you are concerned about your fertility. If you are going to have surgery, tell your doctor or health care professional that you have taken this medicine. What side effects may I notice from receiving this medicine? Side effects that you should report to your doctor or health care professional as soon as possible: -allergic reactions like skin rash, itching or hives, swelling of the face, lips, or tongue -low blood counts - this medicine may decrease the number of white blood cells, red blood cells and platelets. You may be at increased risk for infections and bleeding. -signs of infection - fever or chills, cough, sore throat, pain or difficulty passing urine -signs of decreased platelets or bleeding - bruising, pinpoint red spots on the skin, black, tarry stools, blood in the urine -signs of decreased red blood cells - unusually weak or tired, fainting spells, lightheadedness -breathing problems -dark urine -dizziness -palpitations -swelling of the ankles, feet, hands -trouble passing urine or change in the amount of urine -weight gain -yellowing of the eyes or skin Side effects that usually do not require medical attention (report to your doctor or health care professional if they continue or are bothersome): -changes in nail or skin color -hair loss -missed menstrual periods -mouth sores -nausea, vomiting This list may not describe all possible side effects. Call your doctor for medical advice about side effects. You may report side effects to FDA at 1-800-FDA-1088. Where should I keep my medicine? This drug is given in  a hospital or clinic and will not be stored at home. NOTE: This sheet is a summary. It may not cover all possible information. If you have questions about this medicine, talk to your doctor, pharmacist, or  health care provider.  2018 Elsevier/Gold Standard (2012-05-25 16:22:58)   Doxorubicin injection What is this medicine? DOXORUBICIN (dox oh ROO bi sin) is a chemotherapy drug. It is used to treat many kinds of cancer like leukemia, lymphoma, neuroblastoma, sarcoma, and Wilms' tumor. It is also used to treat bladder cancer, breast cancer, lung cancer, ovarian cancer, stomach cancer, and thyroid cancer. This medicine may be used for other purposes; ask your health care provider or pharmacist if you have questions. COMMON BRAND NAME(S): Adriamycin, Adriamycin PFS, Adriamycin RDF, Rubex What should I tell my health care provider before I take this medicine? They need to know if you have any of these conditions: -heart disease -history of low blood counts caused by a medicine -liver disease -recent or ongoing radiation therapy -an unusual or allergic reaction to doxorubicin, other chemotherapy agents, other medicines, foods, dyes, or preservatives -pregnant or trying to get pregnant -breast-feeding How should I use this medicine? This drug is given as an infusion into a vein. It is administered in a hospital or clinic by a specially trained health care professional. If you have pain, swelling, burning or any unusual feeling around the site of your injection, tell your health care professional right away. Talk to your pediatrician regarding the use of this medicine in children. Special care may be needed. Overdosage: If you think you have taken too much of this medicine contact a poison control center or emergency room at once. NOTE: This medicine is only for you. Do not share this medicine with others. What if I miss a dose? It is important not to miss your dose. Call your doctor or health care professional if you are unable to keep an appointment. What may interact with this medicine? This medicine may interact with the following medications: -6-mercaptopurine -paclitaxel -phenytoin -St.  John's Wort -trastuzumab -verapamil This list may not describe all possible interactions. Give your health care provider a list of all the medicines, herbs, non-prescription drugs, or dietary supplements you use. Also tell them if you smoke, drink alcohol, or use illegal drugs. Some items may interact with your medicine. What should I watch for while using this medicine? This drug may make you feel generally unwell. This is not uncommon, as chemotherapy can affect healthy cells as well as cancer cells. Report any side effects. Continue your course of treatment even though you feel ill unless your doctor tells you to stop. There is a maximum amount of this medicine you should receive throughout your life. The amount depends on the medical condition being treated and your overall health. Your doctor will watch how much of this medicine you receive in your lifetime. Tell your doctor if you have taken this medicine before. You may need blood work done while you are taking this medicine. Your urine may turn red for a few days after your dose. This is not blood. If your urine is dark or brown, call your doctor. In some cases, you may be given additional medicines to help with side effects. Follow all directions for their use. Call your doctor or health care professional for advice if you get a fever, chills or sore throat, or other symptoms of a cold or flu. Do not treat yourself. This drug decreases your body's ability to fight infections.  Try to avoid being around people who are sick. This medicine may increase your risk to bruise or bleed. Call your doctor or health care professional if you notice any unusual bleeding. Talk to your doctor about your risk of cancer. You may be more at risk for certain types of cancers if you take this medicine. Do not become pregnant while taking this medicine or for 6 months after stopping it. Women should inform their doctor if they wish to become pregnant or think they  might be pregnant. Men should not father a child while taking this medicine and for 6 months after stopping it. There is a potential for serious side effects to an unborn child. Talk to your health care professional or pharmacist for more information. Do not breast-feed an infant while taking this medicine. This medicine has caused ovarian failure in some women and reduced sperm counts in some men This medicine may interfere with the ability to have a child. Talk with your doctor or health care professional if you are concerned about your fertility. What side effects may I notice from receiving this medicine? Side effects that you should report to your doctor or health care professional as soon as possible: -allergic reactions like skin rash, itching or hives, swelling of the face, lips, or tongue -breathing problems -chest pain -fast or irregular heartbeat -low blood counts - this medicine may decrease the number of white blood cells, red blood cells and platelets. You may be at increased risk for infections and bleeding. -pain, redness, or irritation at site where injected -signs of infection - fever or chills, cough, sore throat, pain or difficulty passing urine -signs of decreased platelets or bleeding - bruising, pinpoint red spots on the skin, black, tarry stools, blood in the urine -swelling of the ankles, feet, hands -tiredness -weakness Side effects that usually do not require medical attention (report to your doctor or health care professional if they continue or are bothersome): -diarrhea -hair loss -mouth sores -nail discoloration or damage -nausea -red colored urine -vomiting This list may not describe all possible side effects. Call your doctor for medical advice about side effects. You may report side effects to FDA at 1-800-FDA-1088. Where should I keep my medicine? This drug is given in a hospital or clinic and will not be stored at home. NOTE: This sheet is a summary. It  may not cover all possible information. If you have questions about this medicine, talk to your doctor, pharmacist, or health care provider.  2018 Elsevier/Gold Standard (2015-09-07 11:28:51)   Fosaprepitant injection What is this medicine? FOSAPREPITANT (fos ap RE pi tant) is used together with other medicines to prevent nausea and vomiting caused by cancer treatment (chemotherapy). This medicine may be used for other purposes; ask your health care provider or pharmacist if you have questions. COMMON BRAND NAME(S): Emend What should I tell my health care provider before I take this medicine? They need to know if you have any of these conditions: -liver disease -an unusual or allergic reaction to fosaprepitant, aprepitant, medicines, foods, dyes, or preservatives -pregnant or trying to get pregnant -breast-feeding How should I use this medicine? This medicine is for injection into a vein. It is given by a health care professional in a hospital or clinic setting. Talk to your pediatrician regarding the use of this medicine in children. Special care may be needed. Overdosage: If you think you have taken too much of this medicine contact a poison control center or emergency room at once.  NOTE: This medicine is only for you. Do not share this medicine with others. What if I miss a dose? This does not apply. What may interact with this medicine? Do not take this medicine with any of these medicines: -cisapride -flibanserin -lomitapide -pimozide This medicine may also interact with the following medications: -diltiazem -female hormones, like estrogens or progestins and birth control pills -medicines for fungal infections like ketoconazole and itraconazole -medicines for HIV -medicines for seizures or to control epilepsy like carbamazepine or phenytoin -medicines used for sleep or anxiety disorders like alprazolam, diazepam, or  midazolam -nefazodone -paroxetine -ranolazine -rifampin -some chemotherapy medications like etoposide, ifosfamide, vinblastine, vincristine -some antibiotics like clarithromycin, erythromycin, troleandomycin -steroid medicines like dexamethasone or methylprednisolone -tolbutamide -warfarin This list may not describe all possible interactions. Give your health care provider a list of all the medicines, herbs, non-prescription drugs, or dietary supplements you use. Also tell them if you smoke, drink alcohol, or use illegal drugs. Some items may interact with your medicine. What should I watch for while using this medicine? Do not take this medicine if you already have nausea and vomiting. Ask your health care provider what to do if you already have nausea. Birth control pills and other methods of hormonal contraception (for example, IUD or patch) may not work properly while you are taking this medicine. Use an extra method of birth control during treatment and for 1 month after your last dose of fosaprepitant. This medicine should not be used continuously for a long time. Visit your doctor or health care professional for regular check-ups. This medicine may change your liver function blood test results. What side effects may I notice from receiving this medicine? Side effects that you should report to your doctor or health care professional as soon as possible: -allergic reactions like skin rash, itching or hives, swelling of the face, lips, or tongue -breathing problems -changes in heart rhythm -high or low blood pressure -pain, redness, or irritation at site where injected -rectal bleeding -serious dizziness or disorientation, confusion -sharp or severe stomach pain -sharp pain in your leg Side effects that usually do not require medical attention (report to your doctor or health care professional if they continue or are bothersome): -constipation or diarrhea -hair  loss -headache -hiccups -loss of appetite -nausea -upset stomach -tiredness This list may not describe all possible side effects. Call your doctor for medical advice about side effects. You may report side effects to FDA at 1-800-FDA-1088. Where should I keep my medicine? This drug is given in a hospital or clinic and will not be stored at home. NOTE: This sheet is a summary. It may not cover all possible information. If you have questions about this medicine, talk to your doctor, pharmacist, or health care provider.  2018 Elsevier/Gold Standard (2014-08-27 10:45:34)  Goserelin injection What is this medicine? GOSERELIN (GOE se rel in) is similar to a hormone found in the body. It lowers the amount of sex hormones that the body makes. Men will have lower testosterone levels and women will have lower estrogen levels while taking this medicine. In men, this medicine is used to treat prostate cancer; the injection is either given once per month or once every 12 weeks. A once per month injection (only) is used to treat women with endometriosis, dysfunctional uterine bleeding, or advanced breast cancer. This medicine may be used for other purposes; ask your health care provider or pharmacist if you have questions. COMMON BRAND NAME(S): Zoladex What should I tell my health  care provider before I take this medicine? They need to know if you have any of these conditions (some only apply to women): -diabetes -heart disease or previous heart attack -high blood pressure -high cholesterol -kidney disease -osteoporosis or low bone density -problems passing urine -spinal cord injury -stroke -tobacco smoker -an unusual or allergic reaction to goserelin, hormone therapy, other medicines, foods, dyes, or preservatives -pregnant or trying to get pregnant -breast-feeding How should I use this medicine? This medicine is for injection under the skin. It is given by a health care professional in a  hospital or clinic setting. Men receive this injection once every 4 weeks or once every 12 weeks. Women will only receive the once every 4 weeks injection. Talk to your pediatrician regarding the use of this medicine in children. Special care may be needed. Overdosage: If you think you have taken too much of this medicine contact a poison control center or emergency room at once. NOTE: This medicine is only for you. Do not share this medicine with others. What if I miss a dose? It is important not to miss your dose. Call your doctor or health care professional if you are unable to keep an appointment. What may interact with this medicine? -female hormones like estrogen -herbal or dietary supplements like black cohosh, chasteberry, or DHEA -female hormones like testosterone -prasterone This list may not describe all possible interactions. Give your health care provider a list of all the medicines, herbs, non-prescription drugs, or dietary supplements you use. Also tell them if you smoke, drink alcohol, or use illegal drugs. Some items may interact with your medicine. What should I watch for while using this medicine? Visit your doctor or health care professional for regular checks on your progress. Your symptoms may appear to get worse during the first weeks of this therapy. Tell your doctor or healthcare professional if your symptoms do not start to get better or if they get worse after this time. Your bones may get weaker if you take this medicine for a long time. If you smoke or frequently drink alcohol you may increase your risk of bone loss. A family history of osteoporosis, chronic use of drugs for seizures (convulsions), or corticosteroids can also increase your risk of bone loss. Talk to your doctor about how to keep your bones strong. This medicine should stop regular monthly menstration in women. Tell your doctor if you continue to [facility]. Women should not become pregnant while taking this  medicine or for 12 weeks after stopping this medicine. Women should inform their doctor if they wish to become pregnant or think they might be pregnant. There is a potential for serious side effects to an unborn child. Talk to your health care professional or pharmacist for more information. Do not breast-feed an infant while taking this medicine. Men should inform their doctors if they wish to father a child. This medicine may lower sperm counts. Talk to your health care professional or pharmacist for more information. What side effects may I notice from receiving this medicine? Side effects that you should report to your doctor or health care professional as soon as possible: -allergic reactions like skin rash, itching or hives, swelling of the face, lips, or tongue -bone pain -breathing problems -changes in vision -chest pain -feeling faint or lightheaded, falls -fever, chills -pain, swelling, warmth in the leg -pain, tingling, numbness in the hands or feet -signs and symptoms of low blood pressure like dizziness; feeling faint or lightheaded, falls; unusually weak  or tired -stomach pain -swelling of the ankles, feet, hands -trouble passing urine or change in the amount of urine -unusually high or low blood pressure -unusually weak or tired Side effects that usually do not require medical attention (report to your doctor or health care professional if they continue or are bothersome): -change in sex drive or performance -changes in breast size in both males and females -changes in emotions or moods -headache -hot flashes -irritation at site where injected -loss of appetite -skin problems like acne, dry skin -vaginal dryness This list may not describe all possible side effects. Call your doctor for medical advice about side effects. You may report side effects to FDA at 1-800-FDA-1088. Where should I keep my medicine? This drug is given in a hospital or clinic and will not be stored  at home. NOTE: This sheet is a summary. It may not cover all possible information. If you have questions about this medicine, talk to your doctor, pharmacist, or health care provider.  2018 Elsevier/Gold Standard (2013-09-17 11:10:35)  Pegfilgrastim injection What is this medicine? PEGFILGRASTIM (PEG fil gra stim) is a long-acting granulocyte colony-stimulating factor that stimulates the growth of neutrophils, a type of white blood cell important in the body's fight against infection. It is used to reduce the incidence of fever and infection in patients with certain types of cancer who are receiving chemotherapy that affects the bone marrow, and to increase survival after being exposed to high doses of radiation. This medicine may be used for other purposes; ask your health care provider or pharmacist if you have questions. COMMON BRAND NAME(S): Neulasta What should I tell my health care provider before I take this medicine? They need to know if you have any of these conditions: -kidney disease -latex allergy -ongoing radiation therapy -sickle cell disease -skin reactions to acrylic adhesives (On-Body Injector only) -an unusual or allergic reaction to pegfilgrastim, filgrastim, other medicines, foods, dyes, or preservatives -pregnant or trying to get pregnant -breast-feeding How should I use this medicine? This medicine is for injection under the skin. If you get this medicine at home, you will be taught how to prepare and give the pre-filled syringe or how to use the On-body Injector. Refer to the patient Instructions for Use for detailed instructions. Use exactly as directed. Tell your healthcare provider immediately if you suspect that the On-body Injector may not have performed as intended or if you suspect the use of the On-body Injector resulted in a missed or partial dose. It is important that you put your used needles and syringes in a special sharps container. Do not put them in a  trash can. If you do not have a sharps container, call your pharmacist or healthcare provider to get one. Talk to your pediatrician regarding the use of this medicine in children. While this drug may be prescribed for selected conditions, precautions do apply. Overdosage: If you think you have taken too much of this medicine contact a poison control center or emergency room at once. NOTE: This medicine is only for you. Do not share this medicine with others. What if I miss a dose? It is important not to miss your dose. Call your doctor or health care professional if you miss your dose. If you miss a dose due to an On-body Injector failure or leakage, a new dose should be administered as soon as possible using a single prefilled syringe for manual use. What may interact with this medicine? Interactions have not been studied. Give your  health care provider a list of all the medicines, herbs, non-prescription drugs, or dietary supplements you use. Also tell them if you smoke, drink alcohol, or use illegal drugs. Some items may interact with your medicine. This list may not describe all possible interactions. Give your health care provider a list of all the medicines, herbs, non-prescription drugs, or dietary supplements you use. Also tell them if you smoke, drink alcohol, or use illegal drugs. Some items may interact with your medicine. What should I watch for while using this medicine? You may need blood work done while you are taking this medicine. If you are going to need a MRI, CT scan, or other procedure, tell your doctor that you are using this medicine (On-Body Injector only). What side effects may I notice from receiving this medicine? Side effects that you should report to your doctor or health care professional as soon as possible: -allergic reactions like skin rash, itching or hives, swelling of the face, lips, or tongue -dizziness -fever -pain, redness, or irritation at site where  injected -pinpoint red spots on the skin -red or dark-brown urine -shortness of breath or breathing problems -stomach or side pain, or pain at the shoulder -swelling -tiredness -trouble passing urine or change in the amount of urine Side effects that usually do not require medical attention (report to your doctor or health care professional if they continue or are bothersome): -bone pain -muscle pain This list may not describe all possible side effects. Call your doctor for medical advice about side effects. You may report side effects to FDA at 1-800-FDA-1088. Where should I keep my medicine? Keep out of the reach of children. Store pre-filled syringes in a refrigerator between 2 and 8 degrees C (36 and 46 degrees F). Do not freeze. Keep in carton to protect from light. Throw away this medicine if it is left out of the refrigerator for more than 48 hours. Throw away any unused medicine after the expiration date. NOTE: This sheet is a summary. It may not cover all possible information. If you have questions about this medicine, talk to your doctor, pharmacist, or health care provider.  2018 Elsevier/Gold Standard (2016-07-07 12:58:03)

## 2018-02-06 ENCOUNTER — Inpatient Hospital Stay: Payer: Commercial Managed Care - PPO

## 2018-02-06 ENCOUNTER — Other Ambulatory Visit: Payer: Self-pay

## 2018-02-06 ENCOUNTER — Telehealth: Payer: Self-pay | Admitting: *Deleted

## 2018-02-06 VITALS — BP 118/81 | HR 108 | Temp 98.6°F | Resp 18 | Ht 65.0 in | Wt 144.2 lb

## 2018-02-06 DIAGNOSIS — Z5111 Encounter for antineoplastic chemotherapy: Secondary | ICD-10-CM | POA: Diagnosis not present

## 2018-02-06 DIAGNOSIS — E860 Dehydration: Secondary | ICD-10-CM

## 2018-02-06 DIAGNOSIS — Z170 Estrogen receptor positive status [ER+]: Secondary | ICD-10-CM

## 2018-02-06 DIAGNOSIS — R110 Nausea: Secondary | ICD-10-CM

## 2018-02-06 DIAGNOSIS — C50412 Malignant neoplasm of upper-outer quadrant of left female breast: Secondary | ICD-10-CM

## 2018-02-06 MED ORDER — SODIUM CHLORIDE 0.9 % IV SOLN
Freq: Once | INTRAVENOUS | Status: AC
  Administered 2018-02-06: 16:00:00 via INTRAVENOUS

## 2018-02-06 MED ORDER — ONDANSETRON HCL 4 MG/2ML IJ SOLN
8.0000 mg | Freq: Once | INTRAMUSCULAR | Status: AC
  Administered 2018-02-06: 8 mg via INTRAVENOUS

## 2018-02-06 MED ORDER — SODIUM CHLORIDE 0.9 % IV SOLN: Freq: Once | INTRAVENOUS | Status: DC

## 2018-02-06 MED ORDER — ONDANSETRON HCL 4 MG/2ML IJ SOLN: 8.0000 mg | Freq: Once | INTRAMUSCULAR | Status: DC

## 2018-02-06 MED ORDER — HEPARIN SOD (PORK) LOCK FLUSH 100 UNIT/ML IV SOLN
500.0000 [IU] | Freq: Once | INTRAVENOUS | Status: AC
  Administered 2018-02-06: 500 [IU] via INTRAVENOUS
  Filled 2018-02-06: qty 5

## 2018-02-06 MED ORDER — SODIUM CHLORIDE 0.9% FLUSH
10.0000 mL | INTRAVENOUS | Status: AC
  Administered 2018-02-06: 10 mL
  Filled 2018-02-06: qty 10

## 2018-02-06 MED ORDER — ONDANSETRON HCL 4 MG/2ML IJ SOLN
INTRAMUSCULAR | Status: AC
  Filled 2018-02-06: qty 4

## 2018-02-06 NOTE — Patient Instructions (Signed)
Dehydration, Adult Dehydration is when there is not enough fluid or water in your body. This happens when you lose more fluids than you take in. Dehydration can range from mild to very bad. It should be treated right away to keep it from getting very bad. Symptoms of mild dehydration may include:  Thirst.  Dry lips.  Slightly dry mouth.  Dry, warm skin.  Dizziness. Symptoms of moderate dehydration may include:  Very dry mouth.  Muscle cramps.  Dark pee (urine). Pee may be the color of tea.  Your body making less pee.  Your eyes making fewer tears.  Heartbeat that is uneven or faster than normal (palpitations).  Headache.  Light-headedness, especially when you stand up from sitting.  Fainting (syncope). Symptoms of very bad dehydration may include:  Changes in skin, such as: ? Cold and clammy skin. ? Blotchy (mottled) or pale skin. ? Skin that does not quickly return to normal after being lightly pinched and let go (poor skin turgor).  Changes in body fluids, such as: ? Feeling very thirsty. ? Your eyes making fewer tears. ? Not sweating when body temperature is high, such as in hot weather. ? Your body making very little pee.  Changes in vital signs, such as: ? Weak pulse. ? Pulse that is more than 100 beats a minute when you are sitting still. ? Fast breathing. ? Low blood pressure.  Other changes, such as: ? Sunken eyes. ? Cold hands and feet. ? Confusion. ? Lack of energy (lethargy). ? Trouble waking up from sleep. ? Short-term weight loss. ? Unconsciousness. Follow these instructions at home:  If told by your doctor, drink an ORS: ? Make an ORS by using instructions on the package. ? Start by drinking small amounts, about  cup (120 mL) every 5-10 minutes. ? Slowly drink more until you have had the amount that your doctor said to have.  Drink enough clear fluid to keep your pee clear or pale yellow. If you were told to drink an ORS, finish the ORS  first, then start slowly drinking clear fluids. Drink fluids such as: ? Water. Do not drink only water by itself. Doing that can make the salt (sodium) level in your body get too low (hyponatremia). ? Ice chips. ? Fruit juice that you have added water to (diluted). ? Low-calorie sports drinks.  Avoid: ? Alcohol. ? Drinks that have a lot of sugar. These include high-calorie sports drinks, fruit juice that does not have water added, and soda. ? Caffeine. ? Foods that are greasy or have a lot of fat or sugar.  Take over-the-counter and prescription medicines only as told by your doctor.  Do not take salt tablets. Doing that can make the salt level in your body get too high (hypernatremia).  Eat foods that have minerals (electrolytes). Examples include bananas, oranges, potatoes, tomatoes, and spinach.  Keep all follow-up visits as told by your doctor. This is important. Contact a doctor if:  You have belly (abdominal) pain that: ? Gets worse. ? Stays in one area (localizes).  You have a rash.  You have a stiff neck.  You get angry or annoyed more easily than normal (irritability).  You are more sleepy than normal.  You have a harder time waking up than normal.  You feel: ? Weak. ? Dizzy. ? Very thirsty.  You have peed (urinated) only a small amount of very dark pee during 6-8 hours. Get help right away if:  You have symptoms of   very bad dehydration.  You cannot drink fluids without throwing up (vomiting).  Your symptoms get worse with treatment.  You have a fever.  You have a very bad headache.  You are throwing up or having watery poop (diarrhea) and it: ? Gets worse. ? Does not go away.  You have blood or something green (bile) in your throw-up.  You have blood in your poop (stool). This may cause poop to look black and tarry.  You have not peed in 6-8 hours.  You pass out (faint).  Your heart rate when you are sitting still is more than 100 beats a  minute.  You have trouble breathing. This information is not intended to replace advice given to you by your health care provider. Make sure you discuss any questions you have with your health care provider. Document Released: 05/07/2009 Document Revised: 01/29/2016 Document Reviewed: 09/04/2015 Elsevier Interactive Patient Education  2018 Elsevier Inc.  

## 2018-02-06 NOTE — Progress Notes (Signed)
Pt reports dec in nausea and feeling overall better at end of infusion.

## 2018-02-06 NOTE — Telephone Encounter (Signed)
Pt reached out with c/o nausea and weakness. Pt relate drinking plenty of fluids and taking anti-nausea medication prescribed but still having nausea. Pt arrived to [facility] for IVF. Appt made with [facility] for 3 pm. Pt aware

## 2018-02-06 NOTE — Addendum Note (Signed)
Addended by: Henreitta Leber E on: 02/06/2018 03:41 PM   Modules accepted: Orders

## 2018-02-08 ENCOUNTER — Telehealth: Payer: Self-pay | Admitting: Hematology and Oncology

## 2018-02-08 NOTE — Telephone Encounter (Signed)
Spoke to patient regarding upcoming July appts per 7/17 sch message  °

## 2018-02-13 ENCOUNTER — Ambulatory Visit: Payer: Commercial Managed Care - PPO

## 2018-02-13 ENCOUNTER — Other Ambulatory Visit: Payer: Commercial Managed Care - PPO

## 2018-02-16 ENCOUNTER — Inpatient Hospital Stay (HOSPITAL_BASED_OUTPATIENT_CLINIC_OR_DEPARTMENT_OTHER): Payer: Commercial Managed Care - PPO | Admitting: Adult Health

## 2018-02-16 ENCOUNTER — Inpatient Hospital Stay: Payer: Commercial Managed Care - PPO

## 2018-02-16 ENCOUNTER — Encounter: Payer: Self-pay | Admitting: *Deleted

## 2018-02-16 ENCOUNTER — Encounter: Payer: Self-pay | Admitting: Adult Health

## 2018-02-16 ENCOUNTER — Telehealth: Payer: Self-pay | Admitting: Adult Health

## 2018-02-16 VITALS — BP 134/92 | HR 88 | Temp 98.6°F | Resp 17 | Ht 65.0 in | Wt 143.8 lb

## 2018-02-16 DIAGNOSIS — C7989 Secondary malignant neoplasm of other specified sites: Secondary | ICD-10-CM

## 2018-02-16 DIAGNOSIS — Z170 Estrogen receptor positive status [ER+]: Secondary | ICD-10-CM

## 2018-02-16 DIAGNOSIS — Z8041 Family history of malignant neoplasm of ovary: Secondary | ICD-10-CM

## 2018-02-16 DIAGNOSIS — Z95828 Presence of other vascular implants and grafts: Secondary | ICD-10-CM

## 2018-02-16 DIAGNOSIS — D701 Agranulocytosis secondary to cancer chemotherapy: Secondary | ICD-10-CM | POA: Diagnosis not present

## 2018-02-16 DIAGNOSIS — R110 Nausea: Secondary | ICD-10-CM

## 2018-02-16 DIAGNOSIS — C50412 Malignant neoplasm of upper-outer quadrant of left female breast: Secondary | ICD-10-CM

## 2018-02-16 DIAGNOSIS — T451X5A Adverse effect of antineoplastic and immunosuppressive drugs, initial encounter: Secondary | ICD-10-CM

## 2018-02-16 DIAGNOSIS — C773 Secondary and unspecified malignant neoplasm of axilla and upper limb lymph nodes: Secondary | ICD-10-CM

## 2018-02-16 DIAGNOSIS — Z800 Family history of malignant neoplasm of digestive organs: Secondary | ICD-10-CM

## 2018-02-16 DIAGNOSIS — Z5111 Encounter for antineoplastic chemotherapy: Secondary | ICD-10-CM | POA: Diagnosis not present

## 2018-02-16 DIAGNOSIS — Z803 Family history of malignant neoplasm of breast: Secondary | ICD-10-CM

## 2018-02-16 LAB — CMP (CANCER CENTER ONLY)
ALK PHOS: 77 U/L (ref 38–126)
ALT: 37 U/L (ref 0–44)
AST: 20 U/L (ref 15–41)
Albumin: 4.6 g/dL (ref 3.5–5.0)
Anion gap: 9 (ref 5–15)
BILIRUBIN TOTAL: 0.2 mg/dL — AB (ref 0.3–1.2)
BUN: 10 mg/dL (ref 6–20)
CALCIUM: 9.5 mg/dL (ref 8.9–10.3)
CHLORIDE: 109 mmol/L (ref 98–111)
CO2: 25 mmol/L (ref 22–32)
CREATININE: 0.76 mg/dL (ref 0.44–1.00)
Glucose, Bld: 98 mg/dL (ref 70–99)
Potassium: 3.9 mmol/L (ref 3.5–5.1)
Sodium: 143 mmol/L (ref 135–145)
TOTAL PROTEIN: 7.3 g/dL (ref 6.5–8.1)

## 2018-02-16 LAB — CBC WITH DIFFERENTIAL (CANCER CENTER ONLY)
BASOS ABS: 0.1 10*3/uL (ref 0.0–0.1)
Basophils Relative: 1 %
Eosinophils Absolute: 0.1 10*3/uL (ref 0.0–0.5)
Eosinophils Relative: 1 %
HEMATOCRIT: 38.8 % (ref 34.8–46.6)
HEMOGLOBIN: 12.9 g/dL (ref 11.6–15.9)
LYMPHS ABS: 1.2 10*3/uL (ref 0.9–3.3)
LYMPHS PCT: 18 %
MCH: 29.5 pg (ref 25.1–34.0)
MCHC: 33.2 g/dL (ref 31.5–36.0)
MCV: 88.8 fL (ref 79.5–101.0)
Monocytes Absolute: 0.5 10*3/uL (ref 0.1–0.9)
Monocytes Relative: 7 %
NEUTROS ABS: 5.2 10*3/uL (ref 1.5–6.5)
Neutrophils Relative %: 73 %
Platelet Count: 320 10*3/uL (ref 145–400)
RBC: 4.37 MIL/uL (ref 3.70–5.45)
RDW: 14.1 % (ref 11.2–14.5)
WBC Count: 7 10*3/uL (ref 3.9–10.3)

## 2018-02-16 MED ORDER — SODIUM CHLORIDE 0.9 % IV SOLN
500.0000 mg/m2 | Freq: Once | INTRAVENOUS | Status: AC
  Administered 2018-02-16: 880 mg via INTRAVENOUS
  Filled 2018-02-16: qty 44

## 2018-02-16 MED ORDER — SODIUM CHLORIDE 0.9 % IV SOLN
Freq: Once | INTRAVENOUS | Status: AC
  Administered 2018-02-16: 14:00:00 via INTRAVENOUS
  Filled 2018-02-16: qty 250

## 2018-02-16 MED ORDER — PEGFILGRASTIM 6 MG/0.6ML ~~LOC~~ PSKT
PREFILLED_SYRINGE | SUBCUTANEOUS | Status: AC
  Filled 2018-02-16: qty 0.6

## 2018-02-16 MED ORDER — PALONOSETRON HCL INJECTION 0.25 MG/5ML
INTRAVENOUS | Status: AC
  Filled 2018-02-16: qty 5

## 2018-02-16 MED ORDER — SODIUM CHLORIDE 0.9% FLUSH
10.0000 mL | INTRAVENOUS | Status: DC | PRN
  Administered 2018-02-16: 10 mL
  Filled 2018-02-16: qty 10

## 2018-02-16 MED ORDER — GOSERELIN ACETATE 3.6 MG ~~LOC~~ IMPL
DRUG_IMPLANT | SUBCUTANEOUS | Status: AC
  Filled 2018-02-16: qty 3.6

## 2018-02-16 MED ORDER — FOSAPREPITANT DIMEGLUMINE INJECTION 150 MG
Freq: Once | INTRAVENOUS | Status: AC
  Administered 2018-02-16: 14:00:00 via INTRAVENOUS
  Filled 2018-02-16: qty 5

## 2018-02-16 MED ORDER — GOSERELIN ACETATE 3.6 MG ~~LOC~~ IMPL
3.6000 mg | DRUG_IMPLANT | Freq: Once | SUBCUTANEOUS | Status: AC
  Administered 2018-02-16: 3.6 mg via SUBCUTANEOUS

## 2018-02-16 MED ORDER — SODIUM CHLORIDE 0.9% FLUSH
10.0000 mL | INTRAVENOUS | Status: DC | PRN
  Administered 2018-02-16: 10 mL via INTRAVENOUS
  Filled 2018-02-16: qty 10

## 2018-02-16 MED ORDER — PEGFILGRASTIM 6 MG/0.6ML ~~LOC~~ PSKT
6.0000 mg | PREFILLED_SYRINGE | Freq: Once | SUBCUTANEOUS | Status: AC
  Administered 2018-02-16: 6 mg via SUBCUTANEOUS

## 2018-02-16 MED ORDER — PALONOSETRON HCL INJECTION 0.25 MG/5ML
0.2500 mg | Freq: Once | INTRAVENOUS | Status: AC
  Administered 2018-02-16: 0.25 mg via INTRAVENOUS

## 2018-02-16 MED ORDER — DOXORUBICIN HCL CHEMO IV INJECTION 2 MG/ML
50.0000 mg/m2 | Freq: Once | INTRAVENOUS | Status: AC
  Administered 2018-02-16: 88 mg via INTRAVENOUS
  Filled 2018-02-16: qty 44

## 2018-02-16 MED ORDER — HEPARIN SOD (PORK) LOCK FLUSH 100 UNIT/ML IV SOLN
500.0000 [IU] | Freq: Once | INTRAVENOUS | Status: AC | PRN
  Administered 2018-02-16: 500 [IU]
  Filled 2018-02-16: qty 5

## 2018-02-16 NOTE — Patient Instructions (Signed)
[facility] Discharge Instructions for Patients Receiving Chemotherapy  Today you received the following chemotherapy agents: Adriamycin, Cytoxan  To help prevent nausea and vomiting after your treatment, we encourage you to take your nausea medication as directed.   If you develop nausea and vomiting that is not controlled by your nausea medication, call the clinic.   BELOW ARE SYMPTOMS THAT SHOULD BE REPORTED IMMEDIATELY:  *FEVER GREATER THAN 100.5 F  *CHILLS WITH OR WITHOUT FEVER  NAUSEA AND VOMITING THAT IS NOT CONTROLLED WITH YOUR NAUSEA MEDICATION  *UNUSUAL SHORTNESS OF BREATH  *UNUSUAL BRUISING OR BLEEDING  TENDERNESS IN MOUTH AND THROAT WITH OR WITHOUT PRESENCE OF ULCERS  *URINARY PROBLEMS  *BOWEL PROBLEMS  UNUSUAL RASH Items with * indicate a potential emergency and should be followed up as soon as possible.  Feel free to call the clinic should you have any questions or concerns. The clinic phone number is [phone].  Please show the CHEMO ALERT CARD at check-in to the Emergency Department and triage nurse.   

## 2018-02-16 NOTE — Assessment & Plan Note (Signed)
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  Recommendation: 1. Neoadjuvant chemotherapy with dose dense Adriamycin and Cytoxan followed by Taxol weekly x12 2. Followed by surgery (which could be mastectomy) with AXLND versus TAD 3. Followed by radiation 4. Followed by antiestrogen therapy  Review of scans: No evidence of metastatic disease by CT scans and bone scan. Breast MRI 01/10/2018: Necrotic mass 2.4 x 3.2 x 4 cm, 4 abnormal lymph nodes in the axilla  Current treatment: Cycle 3 day 1 dose dense Adriamycin and Cytoxan  Chemo toxicities: 1.  Nausea: Improved with Zofran  2. flulike symptoms due to Neulasta currently on Claritin-D 3. Neutropenia: cycle 2 has been dose reduced  Kristi [name] is doing well.  She denies any issues today.  She will proceed with her third cycle of treatment.  She will receive IV fluids on Tuesday following her treatment and I have placed orders for these.    Kristi [name] will return in 2 weeks also for labs, f/u with Dr. Lindi Adie, and her final Adriamycin/Cytoxan.

## 2018-02-16 NOTE — Progress Notes (Signed)
[facility] Follow up:    Kristi [name] [id number] [place] [address]   DIAGNOSIS: Cancer Staging Malignant neoplasm of upper-outer quadrant of left breast in female, estrogen receptor positive ([place]) Staging form: Breast, AJCC 8th Edition - Clinical: Stage IIB (cT2, cN1, cM0, G3, ER+, PR+, HER2: Equivocal) - Signed by Nicholas Lose, MD on 01/04/2018   SUMMARY OF ONCOLOGIC HISTORY:   Malignant neoplasm of upper-outer quadrant of left breast in female, estrogen receptor positive ([place])   01/02/2018 Initial Diagnosis    Palpable lump left breast, mammogram: Left breast UOQ 2.3 cm mass, axilla has several enlarged lymph nodes; 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      01/04/2018 Cancer Staging    Staging form: Breast, AJCC 8th Edition - Clinical: Stage IIB (cT2, cN1, cM0, G3, ER+, PR+, HER2: Equivocal) - Signed by Nicholas Lose, MD on 01/04/2018      01/16/2018 -  Neo-Adjuvant Chemotherapy    Dose dense Adriamycin and Cytoxan x4 followed by Taxol weekly x12      01/19/2018 Genetic Testing    STK11 c.608C>T VUS identified on the Multicancer panel.  The Multi-Gene Panel offered by [facility] includes sequencing and/or deletion duplication testing of the following 83 genes: ALK, APC, ATM, AXIN2,BAP1,  BARD1, BLM, BMPR1A, BRCA1, BRCA2, BRIP1, CASR, CDC73, CDH1, CDK4, CDKN1B, CDKN1C, CDKN2A (p14ARF), CDKN2A (p16INK4a), CEBPA, CHEK2, CTNNA1, DICER1, DIS3L2, EGFR (c.2369C>T, p.Thr790Met variant only), EPCAM (Deletion/duplication testing only), FH, FLCN, GATA2, GPC3, GREM1 (Promoter region deletion/duplication testing only), HOXB13 (c.251G>A, p.Gly84Glu), HRAS, KIT, MAX, MEN1, MET, MITF (c.952G>A, p.Glu318Lys variant only), MLH1, MSH2, MSH3, MSH6, MUTYH, NBN, NF1, NF2, NTHL1, PALB2, PDGFRA, PHOX2B, PMS2, POLD1, POLE, POT1, PRKAR1A, PTCH1, PTEN, RAD50, RAD51C,  RAD51D, RB1, RECQL4, RET, RUNX1, SDHAF2, SDHA (sequence changes only), SDHB, SDHC, SDHD, SMAD4, SMARCA4, SMARCB1, SMARCE1, STK11, SUFU, TERT, TERT, TMEM127, TP53, TSC1, TSC2, VHL, WRN and WT1.  The report date is January 19, 2018.       CURRENT THERAPY: Adriamycin/Cytoxan cycle 3  INTERVAL HISTORY: Kristi [name] 42 y.o. female returns for evaluation prior to receiving her third cycle of Adriamycin and Cytoxan.  She is doing well today.  She is tolerating chemotherapy well, and notes some mild fatigue and nausea following chemotherapy.  She does receive IV fluids on the tuesdays following chemotherapy and tolerates these well.     Patient Active Problem List   Diagnosis Date Noted  . Genetic testing 01/29/2018  . Family history of breast cancer   . Family history of prostate cancer   . Family history of ovarian cancer   . Family history of pancreatic cancer   . Malignant neoplasm of upper-outer quadrant of left breast in female, estrogen receptor positive ([place]) 01/04/2018    is allergic to sulfa antibiotics.  MEDICAL HISTORY: Past Medical History:  Diagnosis Date  . Breast cancer, left ([place]) 12/2017  . Dental crowns present   . Endometriosis   . Family history of breast cancer   . Family history of ovarian cancer   . Family history of pancreatic cancer   . Family history of prostate cancer   . Migraines     SURGICAL HISTORY: Past Surgical History:  Procedure Laterality Date  . OVARIAN CYST REMOVAL    . PORTACATH PLACEMENT Right 01/15/2018   Procedure: INSERTION PORT-A-CATH WITH [place];  Surgeon: Rolm Bookbinder, MD;  Location: [facility];  Service: General;  Laterality: Right;    SOCIAL HISTORY: Social History   Socioeconomic History  . Marital status: Single    Spouse name: Not on file  . Number of children: Not on file  . Years of education: Not on file  . Highest education level: Not on file  Occupational History  . Not on file  Social Needs  .  Financial resource strain: Not on file  . Food insecurity:    Worry: Not on file    Inability: Not on file  . Transportation needs:    Medical: Not on file    Non-medical: Not on file  Tobacco Use  . Smoking status: Never Smoker  . Smokeless tobacco: Never Used  Substance and Sexual Activity  . Alcohol use: Yes    Comment: occasionally  . Drug use: Never  . Sexual activity: Not on file  Lifestyle  . Physical activity:    Days per week: Not on file    Minutes per session: Not on file  . Stress: Not on file  Relationships  . Social connections:    Talks on phone: Not on file    Gets together: Not on file    Attends religious service: Not on file    Active member of club or organization: Not on file    Attends meetings of clubs or organizations: Not on file    Relationship status: Not on file  . Intimate partner violence:    Fear of current or ex partner: Not on file    Emotionally abused: Not on file    Physically abused: Not on file    Forced sexual activity: Not on file  Other Topics Concern  . Not on file  Social History Narrative  . Not on file    FAMILY HISTORY: Family History  Problem Relation Age of Onset  . Prostate cancer Father 55       Stage 1  . Ovarian cancer Maternal Aunt 19       ? Germ cell?  . Prostate cancer Maternal Uncle 65       prostectomy  . Pancreatic cancer Maternal Grandmother 59  . Colon cancer Maternal Grandfather 68  . Heart attack Paternal Grandmother   . Prostate cancer Paternal Grandfather        dx in his 33s, d. 89s-90s  . Breast cancer Other        MGMs sister dx over 19    Review of Systems  Constitutional: Positive for fatigue. Negative for appetite change, chills, fever and unexpected weight change.  HENT:   Negative for hearing loss, lump/mass and trouble swallowing.   Eyes: Negative for eye problems and icterus.  Respiratory: Negative for chest tightness, cough and shortness of breath.   Cardiovascular: Negative for  chest pain, leg swelling and palpitations.  Gastrointestinal: Negative for abdominal distention, abdominal pain, constipation, diarrhea, nausea and vomiting.  Endocrine: Negative for hot flashes.  Skin: Negative for itching and rash.  Neurological: Negative for dizziness, extremity weakness, headaches and numbness.  Hematological: Negative for adenopathy. Does not bruise/bleed easily.  Psychiatric/Behavioral: Negative for depression. The patient is not nervous/anxious.       PHYSICAL EXAMINATION  ECOG PERFORMANCE STATUS: 1 - Symptomatic but completely ambulatory  Vitals:   02/16/18 1301  BP: (!) 134/92  Pulse: 88  Resp: 17  Temp: 98.6 F (37 C)  SpO2: 100%    Physical Exam  Constitutional: She is oriented to person, place, and time. She  appears well-developed and well-nourished.  HENT:  Head: Normocephalic and atraumatic.  Mouth/Throat: Oropharynx is clear and moist. No oropharyngeal exudate.  Eyes: Pupils are equal, round, and reactive to light. No scleral icterus.  Neck: Neck supple.  Cardiovascular: Normal rate, regular rhythm and normal heart sounds.  Pulmonary/Chest: Effort normal and breath sounds normal. No stridor. No respiratory distress. She has no wheezes. She has no rales.  Abdominal: Soft. Bowel sounds are normal. She exhibits no distension and no mass. There is no tenderness. There is no guarding.  Musculoskeletal: She exhibits no edema.  Lymphadenopathy:    She has no cervical adenopathy.  Neurological: She is alert and oriented to person, place, and time.  Skin: Skin is warm and dry. Capillary refill takes less than 2 seconds. No rash noted.  Psychiatric: She has a normal mood and affect.    LABORATORY DATA:  CBC    Component Value Date/Time   WBC 7.0 02/16/2018 1210   RBC 4.37 02/16/2018 1210   HGB 12.9 02/16/2018 1210   HCT 38.8 02/16/2018 1210   PLT 320 02/16/2018 1210   MCV 88.8 02/16/2018 1210   MCH 29.5 02/16/2018 1210   MCHC 33.2 02/16/2018  1210   RDW 14.1 02/16/2018 1210   LYMPHSABS 1.2 02/16/2018 1210   MONOABS 0.5 02/16/2018 1210   EOSABS 0.1 02/16/2018 1210   BASOSABS 0.1 02/16/2018 1210    CMP     Component Value Date/Time   NA 143 02/16/2018 1210   K 3.9 02/16/2018 1210   CL 109 02/16/2018 1210   CO2 25 02/16/2018 1210   GLUCOSE 98 02/16/2018 1210   BUN 10 02/16/2018 1210   CREATININE 0.76 02/16/2018 1210   CALCIUM 9.5 02/16/2018 1210   PROT 7.3 02/16/2018 1210   ALBUMIN 4.6 02/16/2018 1210   AST 20 02/16/2018 1210   ALT 37 02/16/2018 1210   ALKPHOS 77 02/16/2018 1210   BILITOT 0.2 (L) 02/16/2018 1210   GFRNONAA >60 02/16/2018 1210   GFRAA >60 02/16/2018 1210      ASSESSMENT and PLAN:   Malignant neoplasm of upper-outer quadrant of left breast in female, estrogen receptor positive ([place]) 01/02/2018: Palpable lump left breast since December 2018, mammogram: Left breast UOQ 2.3 cm mass, axilla has several enlarged lymph nodes; 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  Recommendation: 1. Neoadjuvant chemotherapy with dose dense Adriamycin and Cytoxan followed by Taxol weekly x12 2. Followed by surgery (which could be mastectomy) with AXLND versus TAD 3. Followed by radiation 4. Followed by antiestrogen therapy  Review of scans: No evidence of metastatic disease by CT scans and bone scan. Breast MRI 01/10/2018: Necrotic mass 2.4 x 3.2 x 4 cm, 4 abnormal lymph nodes in the axilla  Current treatment: Cycle 3 day 1 dose dense Adriamycin and Cytoxan  Chemo toxicities: 1.  Nausea: Improved with Zofran  2. flulike symptoms due to Neulasta currently on Claritin-D 3. Neutropenia: cycle 2 has been dose reduced  Kristi [name] is doing well.  She denies any issues today.  She will proceed with her third cycle of treatment.  She will receive IV fluids on Tuesday following her treatment and I have placed orders for  these.    Jazalynn will return in 2 weeks also for labs, f/u with Dr. Lindi Adie, and her final Adriamycin/Cytoxan.          All questions were answered. The patient knows  to call the clinic with any problems, questions or concerns. We can certainly see the patient much sooner if necessary.  A total of (30) minutes of face-to-face time was spent with this patient with greater than 50% of that time in counseling and care-coordination.  This note was electronically signed. Scot Dock, NP 02/16/2018

## 2018-02-16 NOTE — Telephone Encounter (Signed)
No 7/26 los 

## 2018-02-19 ENCOUNTER — Telehealth: Payer: Self-pay | Admitting: Hematology and Oncology

## 2018-02-19 NOTE — Telephone Encounter (Signed)
Left message for patient regarding upcoming appts per 7/26 sch message

## 2018-02-20 ENCOUNTER — Telehealth: Payer: Self-pay | Admitting: Hematology and Oncology

## 2018-02-20 ENCOUNTER — Inpatient Hospital Stay: Payer: Commercial Managed Care - PPO

## 2018-02-20 VITALS — BP 123/86 | HR 96 | Temp 97.9°F | Resp 18

## 2018-02-20 DIAGNOSIS — Z170 Estrogen receptor positive status [ER+]: Secondary | ICD-10-CM

## 2018-02-20 DIAGNOSIS — C50412 Malignant neoplasm of upper-outer quadrant of left female breast: Secondary | ICD-10-CM

## 2018-02-20 DIAGNOSIS — Z5111 Encounter for antineoplastic chemotherapy: Secondary | ICD-10-CM | POA: Diagnosis not present

## 2018-02-20 MED ORDER — SODIUM CHLORIDE 0.9 % IV SOLN
INTRAVENOUS | Status: DC
  Administered 2018-02-20: 14:00:00 via INTRAVENOUS
  Filled 2018-02-20 (×2): qty 250

## 2018-02-20 MED ORDER — SODIUM CHLORIDE 0.9% FLUSH
10.0000 mL | Freq: Once | INTRAVENOUS | Status: AC
  Administered 2018-02-20: 10 mL via INTRAVENOUS
  Filled 2018-02-20: qty 10

## 2018-02-20 MED ORDER — HEPARIN SOD (PORK) LOCK FLUSH 100 UNIT/ML IV SOLN
500.0000 [IU] | Freq: Once | INTRAVENOUS | Status: AC
Start: 2018-02-20 — End: 2018-02-20
  Administered 2018-02-20: 500 [IU] via INTRAVENOUS
  Filled 2018-02-20: qty 5

## 2018-02-20 NOTE — Patient Instructions (Signed)
Dehydration, Adult Dehydration is a condition in which there is not enough fluid or water in the body. This happens when you lose more fluids than you take in. Important organs, such as the kidneys, brain, and heart, cannot function without a proper amount of fluids. Any loss of fluids from the body can lead to dehydration. Dehydration can range from mild to severe. This condition should be treated right away to prevent it from becoming severe. What are the causes? This condition may be caused by:  Vomiting.  Diarrhea.  Excessive sweating, such as from heat exposure or exercise.  Not drinking enough fluid, especially: ? When ill. ? While doing activity that requires a lot of energy.  Excessive urination.  Fever.  Infection.  Certain medicines, such as medicines that cause the body to lose excess fluid (diuretics).  Inability to access safe drinking water.  Reduced physical ability to get adequate water and food.  What increases the risk? This condition is more likely to develop in people:  Who have a poorly controlled long-term (chronic) illness, such as diabetes, heart disease, or kidney disease.  Who are age 65 or older.  Who are disabled.  Who live in a place with high altitude.  Who play endurance sports.  What are the signs or symptoms? Symptoms of mild dehydration may include:  Thirst.  Dry lips.  Slightly dry mouth.  Dry, warm skin.  Dizziness. Symptoms of moderate dehydration may include:  Very dry mouth.  Muscle cramps.  Dark urine. Urine may be the color of tea.  Decreased urine production.  Decreased tear production.  Heartbeat that is irregular or faster than normal (palpitations).  Headache.  Light-headedness, especially when you stand up from a sitting position.  Fainting (syncope). Symptoms of severe dehydration may include:  Changes in skin, such as: ? Cold and clammy skin. ? Blotchy (mottled) or pale skin. ? Skin that does  not quickly return to normal after being lightly pinched and released (poor skin turgor).  Changes in body fluids, such as: ? Extreme thirst. ? No tear production. ? Inability to sweat when body temperature is high, such as in hot weather. ? Very little urine production.  Changes in vital signs, such as: ? Weak pulse. ? Pulse that is more than 100 beats a minute when sitting still. ? Rapid breathing. ? Low blood pressure.  Other changes, such as: ? Sunken eyes. ? Cold hands and feet. ? Confusion. ? Lack of energy (lethargy). ? Difficulty waking up from sleep. ? Short-term weight loss. ? Unconsciousness. How is this diagnosed? This condition is diagnosed based on your symptoms and a physical exam. Blood and urine tests may be done to help confirm the diagnosis. How is this treated? Treatment for this condition depends on the severity. Mild or moderate dehydration can often be treated at home. Treatment should be started right away. Do not wait until dehydration becomes severe. Severe dehydration is an emergency and it needs to be treated in a hospital. Treatment for mild dehydration may include:  Drinking more fluids.  Replacing salts and minerals in your blood (electrolytes) that you may have lost. Treatment for moderate dehydration may include:  Drinking an oral rehydration solution (ORS). This is a drink that helps you replace fluids and electrolytes (rehydrate). It can be found at pharmacies and retail stores. Treatment for severe dehydration may include:  Receiving fluids through an IV tube.  Receiving an electrolyte solution through a feeding tube that is passed through your nose   and into your stomach (nasogastric tube, or NG tube).  Correcting any abnormalities in electrolytes.  Treating the underlying cause of dehydration. Follow these instructions at home:  If directed by your health care provider, drink an ORS: ? Make an ORS by following instructions on the  package. ? Start by drinking small amounts, about  cup (120 mL) every 5-10 minutes. ? Slowly increase how much you drink until you have taken the amount recommended by your health care provider.  Drink enough clear fluid to keep your urine clear or pale yellow. If you were told to drink an ORS, finish the ORS first, then start slowly drinking other clear fluids. Drink fluids such as: ? Water. Do not drink only water. Doing that can lead to having too little salt (sodium) in the body (hyponatremia). ? Ice chips. ? Fruit juice that you have added water to (diluted fruit juice). ? Low-calorie sports drinks.  Avoid: ? Alcohol. ? Drinks that contain a lot of sugar. These include high-calorie sports drinks, fruit juice that is not diluted, and soda. ? Caffeine. ? Foods that are greasy or contain a lot of fat or sugar.  Take over-the-counter and prescription medicines only as told by your health care provider.  Do not take sodium tablets. This can lead to having too much sodium in the body (hypernatremia).  Eat foods that contain a healthy balance of electrolytes, such as bananas, oranges, potatoes, tomatoes, and spinach.  Keep all follow-up visits as told by your health care provider. This is important. Contact a health care provider if:  You have abdominal pain that: ? Gets worse. ? Stays in one area (localizes).  You have a rash.  You have a stiff neck.  You are more irritable than usual.  You are sleepier or more difficult to wake up than usual.  You feel weak or dizzy.  You feel very thirsty.  You have urinated only a small amount of very dark urine over 6-8 hours. Get help right away if:  You have symptoms of severe dehydration.  You cannot drink fluids without vomiting.  Your symptoms get worse with treatment.  You have a fever.  You have a severe headache.  You have vomiting or diarrhea that: ? Gets worse. ? Does not go away.  You have blood or green matter  (bile) in your vomit.  You have blood in your stool. This may cause stool to look black and tarry.  You have not urinated in 6-8 hours.  You faint.  Your heart rate while sitting still is over 100 beats a minute.  You have trouble breathing. This information is not intended to replace advice given to you by your health care provider. Make sure you discuss any questions you have with your health care provider. Document Released: 07/11/2005 Document Revised: 02/05/2016 Document Reviewed: 09/04/2015 Elsevier Interactive Patient Education  2018 [employer]  

## 2018-02-20 NOTE — Telephone Encounter (Signed)
Gave patient calendars through November.

## 2018-02-22 ENCOUNTER — Inpatient Hospital Stay: Payer: Commercial Managed Care - PPO | Attending: Hematology and Oncology | Admitting: Medical

## 2018-02-22 ENCOUNTER — Other Ambulatory Visit: Payer: Self-pay | Admitting: Hematology and Oncology

## 2018-02-22 ENCOUNTER — Telehealth: Payer: Self-pay | Admitting: *Deleted

## 2018-02-22 VITALS — BP 124/76 | HR 93 | Temp 97.8°F | Resp 18 | Ht 65.0 in | Wt 144.1 lb

## 2018-02-22 DIAGNOSIS — Z5111 Encounter for antineoplastic chemotherapy: Secondary | ICD-10-CM | POA: Diagnosis present

## 2018-02-22 DIAGNOSIS — C50412 Malignant neoplasm of upper-outer quadrant of left female breast: Secondary | ICD-10-CM

## 2018-02-22 DIAGNOSIS — R110 Nausea: Secondary | ICD-10-CM | POA: Diagnosis present

## 2018-02-22 DIAGNOSIS — R232 Flushing: Secondary | ICD-10-CM | POA: Insufficient documentation

## 2018-02-22 DIAGNOSIS — K5900 Constipation, unspecified: Secondary | ICD-10-CM | POA: Insufficient documentation

## 2018-02-22 DIAGNOSIS — Z170 Estrogen receptor positive status [ER+]: Secondary | ICD-10-CM

## 2018-02-22 DIAGNOSIS — N951 Menopausal and female climacteric states: Secondary | ICD-10-CM | POA: Insufficient documentation

## 2018-02-22 DIAGNOSIS — D701 Agranulocytosis secondary to cancer chemotherapy: Secondary | ICD-10-CM | POA: Insufficient documentation

## 2018-02-22 MED ORDER — GABAPENTIN 300 MG PO CAPS: 300.0000 mg | ORAL_CAPSULE | Freq: Every day | ORAL | 1 refills | Status: DC

## 2018-02-22 NOTE — Telephone Encounter (Signed)
Pt called with c/o nausea not relieved with anti-nausea medications. Relate she is taking all three as prescribed. Informed she feels like she can vomit but is unable to.  Pt scheduled to see [place] PA in [facility]. Pt aware of date and time.' Pt very appreciative for being seen.  [facility] nurse aware of pt and symptoms.

## 2018-02-23 NOTE — Progress Notes (Signed)
[facility] Progress Note   Kristi [name] [id number] 06-Apr-1976 42 y.o.  Kristi [name] is managed by Dr. Lindi Adie  Actively treated with chemotherapy/immunotherapy: yes  Current Therapy: Adriamycin and Cytoxan with PEG filgrastim support  Last Treated:  02/16/2018 (cycle 3, day 1)  Assessment: Plan:    Hot flashes - Plan: gabapentin (NEURONTIN) 300 MG capsule  Nausea without vomiting  Malignant neoplasm of upper-outer quadrant of left breast in female, estrogen receptor positive (HCC)   Hot flashes: The patient was given a prescription for gabapentin 300 mg p.o. Nightly.  Nausea: The patient's antiemetics were reviewed at length with her.  She was told that she could take the Zofran up to 3 times daily as needed.  She also has Ativan 0.5 mg which she was told that she could use up to 4 times daily and take sublingually.  She also has Compazine which she has been prescribed to use 3 times daily.  These were reviewed at length with the patient.  She expressed understanding.  ER positive malignant neoplasm of the left breast: The patient is status post cycle 3, day 1 of Adriamycin and Cytoxan with PEG filgrastim support.  This was last dosed on 02/16/2018.  She is scheduled to be seen in follow-up on 03/02/2018.  Please see After Visit Summary for patient specific instructions.  Future Appointments  Date Time Provider [place]  03/02/2018 11:00 AM [facility] ONLY [facility] None  03/02/2018 11:15 AM [facility] [place] FLUSH [facility] None  03/02/2018 11:45 AM Nicholas Lose, MD [facility] None  03/02/2018  1:00 PM [facility] INFUSION [facility] None  03/06/2018  1:45 PM [facility] INFUSION [facility] None  03/16/2018  9:00 AM [facility] ONLY [facility] None  03/16/2018  9:15 AM [facility] [place] FLUSH [facility] None  03/16/2018  9:45 AM Nicholas Lose, MD [facility] None  03/16/2018 10:30 AM [facility] INFUSION [facility] None  03/23/2018 11:45 AM  [facility] LAB 3 [facility] None  03/23/2018 12:00 PM [facility] [place] FLUSH [facility] None  03/23/2018  1:00 PM [facility] INFUSION [facility] None  03/30/2018 10:15 AM [facility] LAB 5 [facility] None  03/30/2018 10:30 AM [facility] [place] FLUSH [facility] None  03/30/2018 11:00 AM Nicholas Lose, MD [facility] None  03/30/2018 12:00 PM [facility] INFUSION [facility] None  04/06/2018 10:45 AM [facility] ONLY [facility] None  04/06/2018 11:00 AM [facility] [place] FLUSH [facility] None  04/06/2018 12:00 PM [facility] INFUSION [facility] None  04/13/2018 10:45 AM [facility] ONLY [facility] None  04/13/2018 11:00 AM [facility] [place] FLUSH [facility] None  04/13/2018 11:30 AM Nicholas Lose, MD [facility] None  04/13/2018 12:15 PM [facility] INFUSION [facility] None  04/20/2018 10:45 AM [facility] ONLY [facility] None  04/20/2018 11:00 AM [facility] [place] FLUSH [facility] None  04/20/2018 12:00 PM [facility] INFUSION [facility] None  04/27/2018 10:15 AM [facility] LAB 4 [facility] None  04/27/2018 10:30 AM [facility] [place] FLUSH [facility] None  04/27/2018 11:00 AM Nicholas Lose, MD [facility] None  04/27/2018 12:00 PM [facility] INFUSION [facility] None  05/04/2018 10:45 AM [facility] ONLY [facility] None  05/04/2018 11:00 AM [facility] [place] FLUSH [facility] None  05/04/2018 12:00 PM [facility] INFUSION [facility] None  05/11/2018 11:00 AM [facility] ONLY [facility] None  05/11/2018 11:15 AM [facility] [place] FLUSH [facility] None  05/11/2018 11:45 AM Nicholas Lose, MD [facility] None  05/11/2018  1:00 PM [facility] INFUSION [facility] None  05/18/2018 10:45 AM [facility] ONLY [facility] None  05/18/2018 11:00 AM [facility] [place] FLUSH [facility] None  05/18/2018 12:00 PM [facility] INFUSION [facility] None  05/25/2018 10:45 AM [facility]  LAB ONLY [facility] None  05/25/2018 11:00 AM [facility] [place] FLUSH [facility] None  05/25/2018 11:30 AM [place], [name], NP [facility] None  05/25/2018 12:15 PM  [facility] INFUSION [facility] None  06/01/2018 10:45 AM [facility] ONLY [facility] None  06/01/2018 11:00 AM [facility] [place] FLUSH [facility] None  06/01/2018 12:00 PM [facility] INFUSION [facility] None    No orders of the defined types were placed in this encounter.      Subjective:   Patient ID:  Kristi [name] is a 41 y.o. (DOB [birth year]) female.  Chief Complaint: No chief complaint on file.   HPI Kristi [name] is a 42 year old female with a diagnosis of an ER positive malignant neoplasm of the left breast who is managed by Dr. Lindi Adie.  She is status post cycle 3, day 1 of adriamycin and Cytoxan with PEG filgrastim support which was last dosed on 02/16/2018.  She presents to the office today with a report of ongoing nausea without vomiting.  She has prescriptions for Zofran, Ativan, and Compazine.  She also reports having hot flashes.  She denies fevers, chills, or sweats.  Medications: I have reviewed the patient's current medications.  Allergies:  Allergies  Allergen Reactions  . Sulfa Antibiotics Other (See Comments)    SEVERE HEADACHE    Past Medical History:  Diagnosis Date  . Breast cancer, left ([place]) 12/2017  . Dental crowns present   . Endometriosis   . Family history of breast cancer   . Family history of ovarian cancer   . Family history of pancreatic cancer   . Family history of prostate cancer   . Migraines     Past Surgical History:  Procedure Laterality Date  . OVARIAN CYST REMOVAL    . PORTACATH PLACEMENT Right 01/15/2018   Procedure: INSERTION PORT-A-CATH WITH [place];  Surgeon: Rolm Bookbinder, MD;  Location: [place];  Service: General;  Laterality: Right;    Family History  Problem Relation Age of Onset  . Prostate cancer Father 58       Stage 1  . Ovarian cancer Maternal Aunt 19       ? Germ cell?  . Prostate cancer Maternal Uncle 65       prostectomy  . Pancreatic cancer Maternal Grandmother 66  . Colon cancer  Maternal Grandfather 52  . Heart attack Paternal Grandmother   . Prostate cancer Paternal Grandfather        dx in his 60s, d. 13s-90s  . Breast cancer Other        MGMs sister dx over 38    Social History   Socioeconomic History  . Marital status: Single    Spouse name: Not on file  . Number of children: Not on file  . Years of education: Not on file  . Highest education level: Not on file  Occupational History  . Not on file  Social Needs  . Financial resource strain: Not on file  . Food insecurity:    Worry: Not on file    Inability: Not on file  . Transportation needs:    Medical: Not on file    Non-medical: Not on file  Tobacco Use  . Smoking status: Never Smoker  . Smokeless tobacco: Never Used  Substance and Sexual Activity  . Alcohol use: Yes    Comment: occasionally  . Drug use: Never  . Sexual activity: Not on file  Lifestyle  . Physical activity:    Days per week: Not on file  Minutes per session: Not on file  . Stress: Not on file  Relationships  . Social connections:    Talks on phone: Not on file    Gets together: Not on file    Attends religious service: Not on file    Active member of club or organization: Not on file    Attends meetings of clubs or organizations: Not on file    Relationship status: Not on file  . Intimate partner violence:    Fear of current or ex partner: Not on file    Emotionally abused: Not on file    Physically abused: Not on file    Forced sexual activity: Not on file  Other Topics Concern  . Not on file  Social History Narrative  . Not on file    Past Medical History, Surgical history, Social history, and Family history were reviewed and updated as appropriate.   Please see review of systems for further details on the patient's review from today.   Review of Systems:  Review of Systems  Constitutional: Negative for appetite change, chills, diaphoresis and fever.       Hot flashes  Respiratory: Negative for  cough, choking, shortness of breath and wheezing.   Cardiovascular: Negative for chest pain and palpitations.  Gastrointestinal: Positive for nausea. Negative for constipation, diarrhea and vomiting.  Genitourinary: Negative for decreased urine volume.  Neurological: Negative for headaches.    Objective:   Physical Exam:  BP 124/76 (BP Location: Left Arm, Patient Position: Sitting)   Pulse 93   Temp 97.8 F (36.6 C) (Oral)   Resp 18   Ht 5\' 5"  (1.651 m)   Wt 144 lb 1.6 oz (65.4 kg)   SpO2 100%   BMI 23.98 kg/m  ECOG: 0  Physical Exam  Constitutional: No distress.  Neurological: She is alert.  Skin: She is not diaphoretic.  Psychiatric: She has a normal mood and affect. Her behavior is normal. Judgment and thought content normal.    Lab Review:     Component Value Date/Time   NA 143 02/16/2018 1210   K 3.9 02/16/2018 1210   CL 109 02/16/2018 1210   CO2 25 02/16/2018 1210   GLUCOSE 98 02/16/2018 1210   BUN 10 02/16/2018 1210   CREATININE 0.76 02/16/2018 1210   CALCIUM 9.5 02/16/2018 1210   PROT 7.3 02/16/2018 1210   ALBUMIN 4.6 02/16/2018 1210   AST 20 02/16/2018 1210   ALT 37 02/16/2018 1210   ALKPHOS 77 02/16/2018 1210   BILITOT 0.2 (L) 02/16/2018 1210   GFRNONAA >60 02/16/2018 1210   GFRAA >60 02/16/2018 1210       Component Value Date/Time   WBC 7.0 02/16/2018 1210   RBC 4.37 02/16/2018 1210   HGB 12.9 02/16/2018 1210   HCT 38.8 02/16/2018 1210   PLT 320 02/16/2018 1210   MCV 88.8 02/16/2018 1210   MCH 29.5 02/16/2018 1210   MCHC 33.2 02/16/2018 1210   RDW 14.1 02/16/2018 1210   LYMPHSABS 1.2 02/16/2018 1210   MONOABS 0.5 02/16/2018 1210   EOSABS 0.1 02/16/2018 1210   BASOSABS 0.1 02/16/2018 1210   -------------------------------  Imaging from last 24 hours (if applicable):  Radiology interpretation: No results found.

## 2018-02-27 ENCOUNTER — Other Ambulatory Visit: Payer: Commercial Managed Care - PPO

## 2018-02-27 ENCOUNTER — Ambulatory Visit: Payer: Commercial Managed Care - PPO | Admitting: Hematology and Oncology

## 2018-02-27 ENCOUNTER — Ambulatory Visit: Payer: Commercial Managed Care - PPO

## 2018-03-02 ENCOUNTER — Encounter: Payer: Self-pay | Admitting: *Deleted

## 2018-03-02 ENCOUNTER — Inpatient Hospital Stay (HOSPITAL_BASED_OUTPATIENT_CLINIC_OR_DEPARTMENT_OTHER): Payer: Commercial Managed Care - PPO | Admitting: Hematology and Oncology

## 2018-03-02 ENCOUNTER — Inpatient Hospital Stay: Payer: Commercial Managed Care - PPO

## 2018-03-02 DIAGNOSIS — R232 Flushing: Secondary | ICD-10-CM

## 2018-03-02 DIAGNOSIS — Z170 Estrogen receptor positive status [ER+]: Secondary | ICD-10-CM

## 2018-03-02 DIAGNOSIS — D701 Agranulocytosis secondary to cancer chemotherapy: Secondary | ICD-10-CM

## 2018-03-02 DIAGNOSIS — C50412 Malignant neoplasm of upper-outer quadrant of left female breast: Secondary | ICD-10-CM

## 2018-03-02 DIAGNOSIS — N951 Menopausal and female climacteric states: Secondary | ICD-10-CM

## 2018-03-02 DIAGNOSIS — Z5111 Encounter for antineoplastic chemotherapy: Secondary | ICD-10-CM | POA: Diagnosis not present

## 2018-03-02 DIAGNOSIS — R110 Nausea: Secondary | ICD-10-CM

## 2018-03-02 DIAGNOSIS — Z95828 Presence of other vascular implants and grafts: Secondary | ICD-10-CM

## 2018-03-02 LAB — CBC WITH DIFFERENTIAL (CANCER CENTER ONLY)
Basophils Absolute: 0 10*3/uL (ref 0.0–0.1)
Basophils Relative: 0 %
Eosinophils Absolute: 0.1 10*3/uL (ref 0.0–0.5)
Eosinophils Relative: 1 %
HCT: 35.5 % (ref 34.8–46.6)
Hemoglobin: 12 g/dL (ref 11.6–15.9)
Lymphocytes Relative: 12 %
Lymphs Abs: 1 10*3/uL (ref 0.9–3.3)
MCH: 30.1 pg (ref 25.1–34.0)
MCHC: 33.8 g/dL (ref 31.5–36.0)
MCV: 89.3 fL (ref 79.5–101.0)
Monocytes Absolute: 0.5 10*3/uL (ref 0.1–0.9)
Monocytes Relative: 6 %
Neutro Abs: 6.5 10*3/uL (ref 1.5–6.5)
Neutrophils Relative %: 81 %
Platelet Count: 306 10*3/uL (ref 145–400)
RBC: 3.98 MIL/uL (ref 3.70–5.45)
RDW: 15.2 % — ABNORMAL HIGH (ref 11.2–14.5)
WBC Count: 8.1 10*3/uL (ref 3.9–10.3)

## 2018-03-02 LAB — CMP (CANCER CENTER ONLY)
ALT: 38 U/L (ref 0–44)
AST: 21 U/L (ref 15–41)
Albumin: 4.2 g/dL (ref 3.5–5.0)
Alkaline Phosphatase: 79 U/L (ref 38–126)
Anion gap: 8 (ref 5–15)
BUN: 13 mg/dL (ref 6–20)
CHLORIDE: 106 mmol/L (ref 98–111)
CO2: 26 mmol/L (ref 22–32)
CREATININE: 0.72 mg/dL (ref 0.44–1.00)
Calcium: 9.1 mg/dL (ref 8.9–10.3)
GFR, Est AFR Am: >60 mL/min (ref >60)
GFR, Estimated: >60 mL/min (ref >60)
GLUCOSE: 97 mg/dL (ref 70–99)
Potassium: 3.7 mmol/L (ref 3.5–5.1)
SODIUM: 140 mmol/L (ref 135–145)
Total Bilirubin: <0.2 mg/dL — ABNORMAL LOW (ref 0.3–1.2)
Total Protein: 6.9 g/dL (ref 6.5–8.1)

## 2018-03-02 MED ORDER — DOXORUBICIN HCL CHEMO IV INJECTION 2 MG/ML
50.0000 mg/m2 | Freq: Once | INTRAVENOUS | Status: AC
  Administered 2018-03-02: 88 mg via INTRAVENOUS
  Filled 2018-03-02: qty 44

## 2018-03-02 MED ORDER — HEPARIN SOD (PORK) LOCK FLUSH 100 UNIT/ML IV SOLN
500.0000 [IU] | Freq: Once | INTRAVENOUS | Status: AC | PRN
  Administered 2018-03-02: 500 [IU]
  Filled 2018-03-02: qty 5

## 2018-03-02 MED ORDER — PALONOSETRON HCL INJECTION 0.25 MG/5ML
INTRAVENOUS | Status: AC
  Filled 2018-03-02: qty 5

## 2018-03-02 MED ORDER — SODIUM CHLORIDE 0.9 % IV SOLN
500.0000 mg/m2 | Freq: Once | INTRAVENOUS | Status: AC
  Administered 2018-03-02: 880 mg via INTRAVENOUS
  Filled 2018-03-02: qty 44

## 2018-03-02 MED ORDER — PEGFILGRASTIM 6 MG/0.6ML ~~LOC~~ PSKT
6.0000 mg | PREFILLED_SYRINGE | Freq: Once | SUBCUTANEOUS | Status: AC
  Administered 2018-03-02: 6 mg via SUBCUTANEOUS

## 2018-03-02 MED ORDER — PEGFILGRASTIM 6 MG/0.6ML ~~LOC~~ PSKT
PREFILLED_SYRINGE | SUBCUTANEOUS | Status: AC
Start: 2018-03-02 — End: ?
  Filled 2018-03-02: qty 0.6

## 2018-03-02 MED ORDER — SODIUM CHLORIDE 0.9 % IV SOLN
Freq: Once | INTRAVENOUS | Status: AC
  Administered 2018-03-02: 14:00:00 via INTRAVENOUS
  Filled 2018-03-02: qty 5

## 2018-03-02 MED ORDER — SODIUM CHLORIDE 0.9 % IV SOLN
Freq: Once | INTRAVENOUS | Status: AC
  Administered 2018-03-02: 13:00:00 via INTRAVENOUS
  Filled 2018-03-02: qty 250

## 2018-03-02 MED ORDER — PALONOSETRON HCL INJECTION 0.25 MG/5ML
0.2500 mg | Freq: Once | INTRAVENOUS | Status: AC
  Administered 2018-03-02: 0.25 mg via INTRAVENOUS

## 2018-03-02 MED ORDER — SODIUM CHLORIDE 0.9% FLUSH
10.0000 mL | INTRAVENOUS | Status: DC | PRN
  Administered 2018-03-02: 10 mL via INTRAVENOUS
  Filled 2018-03-02: qty 10

## 2018-03-02 MED ORDER — SODIUM CHLORIDE 0.9% FLUSH
10.0000 mL | INTRAVENOUS | Status: DC | PRN
  Administered 2018-03-02: 10 mL
  Filled 2018-03-02: qty 10

## 2018-03-02 NOTE — Progress Notes (Signed)
Patient Care Team: [name] as PCP - General (Physician Assistant)  DIAGNOSIS:  Encounter Diagnosis  Name Primary?  . Malignant neoplasm of upper-outer quadrant of left breast in female, estrogen receptor positive ([place])     SUMMARY OF ONCOLOGIC HISTORY:   Malignant neoplasm of upper-outer quadrant of left breast in female, estrogen receptor positive ([place])   01/02/2018 Initial Diagnosis    Palpable lump left breast, mammogram: Left breast UOQ 2.3 cm mass, axilla has several enlarged lymph nodes; 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    01/04/2018 Cancer Staging    Staging form: Breast, AJCC 8th Edition - Clinical: Stage IIB (cT2, cN1, cM0, G3, ER+, PR+, HER2: Equivocal) - Signed by Nicholas Lose, MD on 01/04/2018    01/16/2018 -  Neo-Adjuvant Chemotherapy    Dose dense Adriamycin and Cytoxan x4 followed by Taxol weekly x12    01/19/2018 Genetic Testing    STK11 c.608C>T VUS identified on the Multicancer panel.  The Multi-Gene Panel offered by [facility] includes sequencing and/or deletion duplication testing of the following 83 genes: ALK, APC, ATM, AXIN2,BAP1,  BARD1, BLM, BMPR1A, BRCA1, BRCA2, BRIP1, CASR, CDC73, CDH1, CDK4, CDKN1B, CDKN1C, CDKN2A (p14ARF), CDKN2A (p16INK4a), CEBPA, CHEK2, CTNNA1, DICER1, DIS3L2, EGFR (c.2369C>T, p.Thr790Met variant only), EPCAM (Deletion/duplication testing only), FH, FLCN, GATA2, GPC3, GREM1 (Promoter region deletion/duplication testing only), HOXB13 (c.251G>A, p.Gly84Glu), HRAS, KIT, MAX, MEN1, MET, MITF (c.952G>A, p.Glu318Lys variant only), MLH1, MSH2, MSH3, MSH6, MUTYH, NBN, NF1, NF2, NTHL1, PALB2, PDGFRA, PHOX2B, PMS2, POLD1, POLE, POT1, PRKAR1A, PTCH1, PTEN, RAD50, RAD51C, RAD51D, RB1, RECQL4, RET, RUNX1, SDHAF2, SDHA (sequence changes only), SDHB, SDHC, SDHD, SMAD4, SMARCA4, SMARCB1, SMARCE1, STK11, SUFU, TERT, TERT, TMEM127, TP53,  TSC1, TSC2, VHL, WRN and WT1.  The report date is January 19, 2018.     CHIEF COMPLIANT: Cycle 4 dose dense Adriamycin and Cytoxan  INTERVAL HISTORY: Stephani Janak is a 42 year old with above-mentioned history of left breast cancer was currently neoadjuvant chemotherapy today cycle 4 of dose dense Adriamycin and Cytoxan.  She appears to be tolerating the treatment reasonably well.  She did have hot flashes for which she is currently on gabapentin which appears to be helping her.  She also had nausea for 2 to 3 days along with fatigue after each chemotherapy.  REVIEW OF SYSTEMS:   Constitutional: Denies fevers, chills or abnormal weight loss Eyes: Denies blurriness of vision Ears, nose, mouth, throat, and face: Denies mucositis or sore throat Respiratory: Denies cough, dyspnea or wheezes Cardiovascular: Denies palpitation, chest discomfort Gastrointestinal:  Denies nausea, heartburn or change in bowel habits Skin: Denies abnormal skin rashes Lymphatics: Denies new lymphadenopathy or easy bruising Neurological:Denies numbness, tingling or new weaknesses Behavioral/Psych: Mood is stable, no new changes  Extremities: No lower extremity edema  All other systems were reviewed with the patient and are negative.  I have reviewed the past medical history, past surgical history, social history and family history with the patient and they are unchanged from previous note.  ALLERGIES:  is allergic to sulfa antibiotics.  MEDICATIONS:  Current Outpatient Medications  Medication Sig Dispense Refill  . baclofen (LIORESAL) 10 MG tablet Take 10 mg by mouth as needed.    Marland Kitchen buPROPion (WELLBUTRIN) 75 MG tablet Take 75 mg by mouth 2 (two) times daily.    Marland Kitchen dexamethasone (DECADRON) 4 MG tablet Take 1 tablet day after chemotherapy and 1 tablet 2 days after chemotherapy with  food 8 tablet 1  . EMGALITY 120 MG/ML SOAJ 120 mg every 30 (thirty) days.    Marland Kitchen gabapentin (NEURONTIN) 300 MG capsule Take 1 capsule  (300 mg total) by mouth at bedtime. 30 capsule 1  . lidocaine-prilocaine (EMLA) cream Apply to affected area once 30 g 3  . LORazepam (ATIVAN) 0.5 MG tablet TAKE 1 TABLET BY MOUTH EVERY 6 HOURS AS NEEDED FOR NAUSEA AND VOMITING 30 tablet 0  . Magnesium 500 MG CAPS Take 500 mg by mouth 2 (two) times daily.    . ondansetron (ZOFRAN) 8 MG tablet Take 1 tablet (8 mg total) by mouth 2 (two) times daily as needed. Start on the third day after chemotherapy. 30 tablet 1  . prochlorperazine (COMPAZINE) 10 MG tablet Take 1 tablet (10 mg total) by mouth every 6 (six) hours as needed (Nausea or vomiting). 30 tablet 1  . riboflavin (VITAMIN B-2) 100 MG TABS tablet Take 400 mg by mouth 2 (two) times daily.    . rizatriptan (MAXALT) 10 MG tablet Take 10 mg by mouth as needed for migraine. May repeat in 2 hours if needed    . traMADol (ULTRAM) 50 MG tablet Take 1 tablet (50 mg total) by mouth every 6 (six) hours as needed. 10 tablet 0   No current facility-administered medications for this visit.     PHYSICAL EXAMINATION: ECOG PERFORMANCE STATUS: 1 - Symptomatic but completely ambulatory  Vitals:   03/02/18 1144  BP: (!) 135/93  Pulse: 96  Resp: 18  Temp: 98.3 F (36.8 C)  SpO2: 100%   Filed Weights   03/02/18 1144  Weight: 144 lb 6.4 oz (65.5 kg)    GENERAL:alert, no distress and comfortable SKIN: skin color, texture, turgor are normal, no rashes or significant lesions EYES: normal, Conjunctiva are pink and non-injected, sclera clear OROPHARYNX:no exudate, no erythema and lips, buccal mucosa, and tongue normal  NECK: supple, thyroid normal size, non-tender, without nodularity LYMPH:  no palpable lymphadenopathy in the cervical, axillary or inguinal LUNGS: clear to auscultation and percussion with normal breathing effort HEART: regular rate & rhythm and no murmurs and no lower extremity edema ABDOMEN:abdomen soft, non-tender and normal bowel sounds MUSCULOSKELETAL:no cyanosis of digits and no  clubbing  NEURO: alert & oriented x 3 with fluent speech, no focal motor/sensory deficits EXTREMITIES: No lower extremity edema   LABORATORY DATA:  I have reviewed the data as listed CMP Latest Ref Rng & Units 03/02/2018 02/16/2018 02/01/2018  Glucose 70 - 99 mg/dL 97 98 109(H)  BUN 6 - 20 mg/dL 13 10 10   Creatinine 0.44 - 1.00 mg/dL 0.72 0.76 0.84  Sodium 135 - 145 mmol/L 140 143 142  Potassium 3.5 - 5.1 mmol/L 3.7 3.9 3.9  Chloride 98 - 111 mmol/L 106 109 106  CO2 22 - 32 mmol/L 26 25 27   Calcium 8.9 - 10.3 mg/dL 9.1 9.5 9.4  Total Protein 6.5 - 8.1 g/dL 6.9 7.3 7.2  Total Bilirubin 0.3 - 1.2 mg/dL <0.2(L) 0.2(L) 0.2(L)  Alkaline Phos 38 - 126 U/L 79 77 73  AST 15 - 41 U/L 21 20 18   ALT 0 - 44 U/L 38 37 30    Lab Results  Component Value Date   WBC 8.1 03/02/2018   HGB 12.0 03/02/2018   HCT 35.5 03/02/2018   MCV 89.3 03/02/2018   PLT 306 03/02/2018   NEUTROABS 6.5 03/02/2018    ASSESSMENT & PLAN:  Malignant neoplasm of upper-outer quadrant of left breast in female, estrogen  receptor positive ([place]) 01/02/2018: Palpable lump left breastsince December 2018, mammogram: Left breast UOQ 2.3 cm mass, axilla has several enlarged lymph nodes; 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  Recommendation: 1. Neoadjuvant chemotherapy with dose dense Adriamycin and Cytoxan followed by Taxol weekly x12 2. Followed by surgery (which could be mastectomy) withAXLND versusTAD 3. Followed by radiation 4. Followed by antiestrogen therapy  Review of scans: No evidence of metastatic disease by CT scans and bone scan. Breast MRI 01/10/2018: Necrotic mass 2.4 x 3.2 x 4 cm, 4 abnormal lymph nodes in the axilla ------------------------------------------------------------------------------ Current treatment: Cycle 4 day 1 dose dense Adriamycin and Cytoxan  Chemo toxicities: 1.Nausea:  Improved with Zofran  2.flulike symptoms due to Neulasta currently on Claritin-D 3.ANC 0.4: Decreasing the dosage of cycle 2 of chemo  4.  Hot flashes due to goserelin: Gabapentin prescribed  Patient had many questions about surgical options.  I requested Dr. Donne Hazel to see her for a follow-up visit. RTC in 2 weeks for cycle 1 Taxol   No orders of the defined types were placed in this encounter.  The patient has a good understanding of the overall plan. she agrees with it. she will call with any problems that may develop before the next visit here.   Harriette Ohara, MD 03/02/18

## 2018-03-02 NOTE — Patient Instructions (Signed)
[facility] Discharge Instructions for Patients Receiving Chemotherapy  Today you received the following chemotherapy agents Adriamycin, Cytoxan.  To help prevent nausea and vomiting after your treatment, we encourage you to take your nausea medication as prescribed.   If you develop nausea and vomiting that is not controlled by your nausea medication, call the clinic.   BELOW ARE SYMPTOMS THAT SHOULD BE REPORTED IMMEDIATELY:  *FEVER GREATER THAN 100.5 F  *CHILLS WITH OR WITHOUT FEVER  NAUSEA AND VOMITING THAT IS NOT CONTROLLED WITH YOUR NAUSEA MEDICATION  *UNUSUAL SHORTNESS OF BREATH  *UNUSUAL BRUISING OR BLEEDING  TENDERNESS IN MOUTH AND THROAT WITH OR WITHOUT PRESENCE OF ULCERS  *URINARY PROBLEMS  *BOWEL PROBLEMS  UNUSUAL RASH Items with * indicate a potential emergency and should be followed up as soon as possible.  Feel free to call the clinic should you have any questions or concerns. The clinic phone number is [phone].  Please show the CHEMO ALERT CARD at check-in to the Emergency Department and triage nurse.   

## 2018-03-02 NOTE — Assessment & Plan Note (Signed)
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  Recommendation: 1. Neoadjuvant chemotherapy with dose dense Adriamycin and Cytoxan followed by Taxol weekly x12 2. Followed by surgery (which could be mastectomy) withAXLND versusTAD 3. Followed by radiation 4. Followed by antiestrogen therapy  Review of scans: No evidence of metastatic disease by CT scans and bone scan. Breast MRI 01/10/2018: Necrotic mass 2.4 x 3.2 x 4 cm, 4 abnormal lymph nodes in the axilla ------------------------------------------------------------------------------ Current treatment: Cycle 4 day 1 dose dense Adriamycin and Cytoxan  Chemo toxicities: 1.Nausea: Improved with Zofran  2.flulike symptoms due to Neulasta currently on Claritin-D 3.ANC 0.4: Decreasing the dosage of cycle 2 of chemo  4.  Hot flashes due to goserelin: Gabapentin prescribed  RTC in 2 weeks for cycle 1 Taxol

## 2018-03-06 ENCOUNTER — Inpatient Hospital Stay: Payer: Commercial Managed Care - PPO

## 2018-03-15 ENCOUNTER — Telehealth: Payer: Self-pay

## 2018-03-15 ENCOUNTER — Other Ambulatory Visit: Payer: Self-pay | Admitting: Hematology and Oncology

## 2018-03-15 ENCOUNTER — Other Ambulatory Visit: Payer: Self-pay | Admitting: *Deleted

## 2018-03-15 DIAGNOSIS — C50412 Malignant neoplasm of upper-outer quadrant of left female breast: Secondary | ICD-10-CM

## 2018-03-15 DIAGNOSIS — Z170 Estrogen receptor positive status [ER+]: Secondary | ICD-10-CM

## 2018-03-15 NOTE — Telephone Encounter (Signed)
Maudie Mercury, from [employer], called in regards to patient needing a crown placed and wanted to ensure this was appropriate while receiving chemotherapy.  Medications reviewed, per Dr. Lindi Adie okay for crown placement.

## 2018-03-16 ENCOUNTER — Inpatient Hospital Stay: Payer: Commercial Managed Care - PPO

## 2018-03-16 ENCOUNTER — Encounter: Payer: Self-pay | Admitting: *Deleted

## 2018-03-16 ENCOUNTER — Telehealth: Payer: Self-pay | Admitting: Hematology and Oncology

## 2018-03-16 ENCOUNTER — Inpatient Hospital Stay (HOSPITAL_BASED_OUTPATIENT_CLINIC_OR_DEPARTMENT_OTHER): Payer: Commercial Managed Care - PPO | Admitting: Hematology and Oncology

## 2018-03-16 VITALS — BP 131/94 | HR 85 | Temp 98.0°F | Resp 17 | Ht 65.0 in | Wt 146.3 lb

## 2018-03-16 VITALS — BP 121/74 | HR 92 | Temp 98.4°F | Resp 16

## 2018-03-16 DIAGNOSIS — C50412 Malignant neoplasm of upper-outer quadrant of left female breast: Secondary | ICD-10-CM

## 2018-03-16 DIAGNOSIS — Z170 Estrogen receptor positive status [ER+]: Secondary | ICD-10-CM

## 2018-03-16 DIAGNOSIS — Z95828 Presence of other vascular implants and grafts: Secondary | ICD-10-CM

## 2018-03-16 DIAGNOSIS — R110 Nausea: Secondary | ICD-10-CM | POA: Diagnosis not present

## 2018-03-16 DIAGNOSIS — D701 Agranulocytosis secondary to cancer chemotherapy: Secondary | ICD-10-CM

## 2018-03-16 DIAGNOSIS — Z5111 Encounter for antineoplastic chemotherapy: Secondary | ICD-10-CM | POA: Diagnosis not present

## 2018-03-16 DIAGNOSIS — K5900 Constipation, unspecified: Secondary | ICD-10-CM

## 2018-03-16 LAB — CMP (CANCER CENTER ONLY)
ALBUMIN: 4.5 g/dL (ref 3.5–5.0)
ALK PHOS: 83 U/L (ref 38–126)
ALT: 38 U/L (ref 0–44)
ANION GAP: 9 (ref 5–15)
AST: 23 U/L (ref 15–41)
BUN: 10 mg/dL (ref 6–20)
CALCIUM: 9.5 mg/dL (ref 8.9–10.3)
CO2: 27 mmol/L (ref 22–32)
CREATININE: 0.7 mg/dL (ref 0.44–1.00)
Chloride: 105 mmol/L (ref 98–111)
GFR, Estimated: >60 mL/min (ref >60)
GLUCOSE: 86 mg/dL (ref 70–99)
Potassium: 3.8 mmol/L (ref 3.5–5.1)
Sodium: 141 mmol/L (ref 135–145)
TOTAL PROTEIN: 7.2 g/dL (ref 6.5–8.1)

## 2018-03-16 LAB — CBC WITH DIFFERENTIAL (CANCER CENTER ONLY)
BASOS ABS: 0.1 10*3/uL (ref 0.0–0.1)
BASOS PCT: 1 %
EOS ABS: 0.1 10*3/uL (ref 0.0–0.5)
Eosinophils Relative: 2 %
HCT: 36.2 % (ref 34.8–46.6)
Hemoglobin: 12.1 g/dL (ref 11.6–15.9)
Lymphocytes Relative: 20 %
Lymphs Abs: 1 10*3/uL (ref 0.9–3.3)
MCH: 30.2 pg (ref 25.1–34.0)
MCHC: 33.4 g/dL (ref 31.5–36.0)
MCV: 90.3 fL (ref 79.5–101.0)
MONO ABS: 0.4 10*3/uL (ref 0.1–0.9)
MONOS PCT: 8 %
Neutro Abs: 3.5 10*3/uL (ref 1.5–6.5)
Neutrophils Relative %: 69 %
PLATELETS: 271 10*3/uL (ref 145–400)
RBC: 4.01 MIL/uL (ref 3.70–5.45)
RDW: 15.8 % — AB (ref 11.2–14.5)
WBC Count: 5 10*3/uL (ref 3.9–10.3)

## 2018-03-16 MED ORDER — DEXAMETHASONE SODIUM PHOSPHATE 10 MG/ML IJ SOLN
INTRAMUSCULAR | Status: AC
  Filled 2018-03-16: qty 1

## 2018-03-16 MED ORDER — HEPARIN SOD (PORK) LOCK FLUSH 100 UNIT/ML IV SOLN
500.0000 [IU] | Freq: Once | INTRAVENOUS | Status: AC | PRN
  Administered 2018-03-16: 500 [IU]
  Filled 2018-03-16: qty 5

## 2018-03-16 MED ORDER — SODIUM CHLORIDE 0.9 % IV SOLN
80.0000 mg/m2 | Freq: Once | INTRAVENOUS | Status: AC
  Administered 2018-03-16: 138 mg via INTRAVENOUS
  Filled 2018-03-16: qty 23

## 2018-03-16 MED ORDER — SODIUM CHLORIDE 0.9 % IV SOLN
Freq: Once | INTRAVENOUS | Status: AC
  Administered 2018-03-16: 12:00:00 via INTRAVENOUS
  Filled 2018-03-16: qty 250

## 2018-03-16 MED ORDER — GOSERELIN ACETATE 3.6 MG ~~LOC~~ IMPL
3.6000 mg | DRUG_IMPLANT | Freq: Once | SUBCUTANEOUS | Status: AC
  Administered 2018-03-16: 3.6 mg via SUBCUTANEOUS

## 2018-03-16 MED ORDER — HEPARIN SOD (PORK) LOCK FLUSH 100 UNIT/ML IV SOLN
500.0000 [IU] | Freq: Once | INTRAVENOUS | Status: DC
  Filled 2018-03-16: qty 5

## 2018-03-16 MED ORDER — GOSERELIN ACETATE 3.6 MG ~~LOC~~ IMPL
DRUG_IMPLANT | SUBCUTANEOUS | Status: AC
  Filled 2018-03-16: qty 3.6

## 2018-03-16 MED ORDER — SODIUM CHLORIDE 0.9 % IV SOLN: 10.0000 mg | Freq: Once | INTRAVENOUS | Status: DC

## 2018-03-16 MED ORDER — DEXAMETHASONE SODIUM PHOSPHATE 10 MG/ML IJ SOLN
10.0000 mg | Freq: Once | INTRAMUSCULAR | Status: AC
  Administered 2018-03-16: 10 mg via INTRAVENOUS

## 2018-03-16 MED ORDER — SODIUM CHLORIDE 0.9% FLUSH
10.0000 mL | INTRAVENOUS | Status: DC | PRN
  Administered 2018-03-16: 10 mL
  Filled 2018-03-16: qty 10

## 2018-03-16 MED ORDER — SODIUM CHLORIDE 0.9% FLUSH
10.0000 mL | INTRAVENOUS | Status: DC | PRN
  Administered 2018-03-16: 10 mL via INTRAVENOUS
  Filled 2018-03-16: qty 10

## 2018-03-16 MED ORDER — DIPHENHYDRAMINE HCL 50 MG/ML IJ SOLN
INTRAMUSCULAR | Status: AC
  Filled 2018-03-16: qty 1

## 2018-03-16 MED ORDER — FAMOTIDINE IN NACL 20-0.9 MG/50ML-% IV SOLN
INTRAVENOUS | Status: AC
  Filled 2018-03-16: qty 50

## 2018-03-16 MED ORDER — FAMOTIDINE IN NACL 20-0.9 MG/50ML-% IV SOLN
20.0000 mg | Freq: Once | INTRAVENOUS | Status: AC
  Administered 2018-03-16: 20 mg via INTRAVENOUS

## 2018-03-16 MED ORDER — DIPHENHYDRAMINE HCL 50 MG/ML IJ SOLN
50.0000 mg | Freq: Once | INTRAMUSCULAR | Status: AC
  Administered 2018-03-16: 50 mg via INTRAVENOUS

## 2018-03-16 NOTE — Telephone Encounter (Signed)
Per 8/23 los, no new orders or referrals.

## 2018-03-16 NOTE — Assessment & Plan Note (Signed)
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  Recommendation: 1. Neoadjuvant chemotherapy with dose dense Adriamycin and Cytoxan followed by Taxol weekly x12 2. Followed by surgery (which could be mastectomy) withAXLND versusTAD 3. Followed by radiation 4. Followed by antiestrogen therapy  Review of scans: No evidence of metastatic disease by CT scans and bone scan. Breast MRI 01/10/2018: Necrotic mass 2.4 x 3.2 x 4 cm, 4 abnormal lymph nodes in the axilla ------------------------------------------------------------------------------ Current treatment: Completed 4 cycles ofdose dense Adriamycin and Cytoxan, today cycle 1 Taxol  Chemo toxicities: Monitoring closely for toxicities. Return to clinic in 1 week for cycle 2 Taxol. After that we can see her every other week.

## 2018-03-16 NOTE — Patient Instructions (Signed)
[facility] Discharge Instructions for Patients Receiving Chemotherapy  Today you received the following chemotherapy agents :  Taxol.  To help prevent nausea and vomiting after your treatment, we encourage you to take your nausea medication as prescribed.   If you develop nausea and vomiting that is not controlled by your nausea medication, call the clinic.   BELOW ARE SYMPTOMS THAT SHOULD BE REPORTED IMMEDIATELY:  *FEVER GREATER THAN 100.5 F  *CHILLS WITH OR WITHOUT FEVER  NAUSEA AND VOMITING THAT IS NOT CONTROLLED WITH YOUR NAUSEA MEDICATION  *UNUSUAL SHORTNESS OF BREATH  *UNUSUAL BRUISING OR BLEEDING  TENDERNESS IN MOUTH AND THROAT WITH OR WITHOUT PRESENCE OF ULCERS  *URINARY PROBLEMS  *BOWEL PROBLEMS  UNUSUAL RASH Items with * indicate a potential emergency and should be followed up as soon as possible.  Feel free to call the clinic should you have any questions or concerns. The clinic phone number is [phone].  Please show the CHEMO ALERT CARD at check-in to the Emergency Department and triage nurse.   

## 2018-03-16 NOTE — Progress Notes (Signed)
Patient Care Team: [name] as PCP - General (Physician Assistant)  DIAGNOSIS:  Encounter Diagnosis  Name Primary?  . Malignant neoplasm of upper-outer quadrant of left breast in female, estrogen receptor positive ([place])     SUMMARY OF ONCOLOGIC HISTORY:   Malignant neoplasm of upper-outer quadrant of left breast in female, estrogen receptor positive ([place])   01/02/2018 Initial Diagnosis    Palpable lump left breast, mammogram: Left breast UOQ 2.3 cm mass, axilla has several enlarged lymph nodes; 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    01/04/2018 Cancer Staging    Staging form: Breast, AJCC 8th Edition - Clinical: Stage IIB (cT2, cN1, cM0, G3, ER+, PR+, HER2: Equivocal) - Signed by Nicholas Lose, MD on 01/04/2018    01/16/2018 -  Neo-Adjuvant Chemotherapy    Dose dense Adriamycin and Cytoxan x4 followed by Taxol weekly x12    01/19/2018 Genetic Testing    STK11 c.608C>T VUS identified on the Multicancer panel.  The Multi-Gene Panel offered by [facility] includes sequencing and/or deletion duplication testing of the following 83 genes: ALK, APC, ATM, AXIN2,BAP1,  BARD1, BLM, BMPR1A, BRCA1, BRCA2, BRIP1, CASR, CDC73, CDH1, CDK4, CDKN1B, CDKN1C, CDKN2A (p14ARF), CDKN2A (p16INK4a), CEBPA, CHEK2, CTNNA1, DICER1, DIS3L2, EGFR (c.2369C>T, p.Thr790Met variant only), EPCAM (Deletion/duplication testing only), FH, FLCN, GATA2, GPC3, GREM1 (Promoter region deletion/duplication testing only), HOXB13 (c.251G>A, p.Gly84Glu), HRAS, KIT, MAX, MEN1, MET, MITF (c.952G>A, p.Glu318Lys variant only), MLH1, MSH2, MSH3, MSH6, MUTYH, NBN, NF1, NF2, NTHL1, PALB2, PDGFRA, PHOX2B, PMS2, POLD1, POLE, POT1, PRKAR1A, PTCH1, PTEN, RAD50, RAD51C, RAD51D, RB1, RECQL4, RET, RUNX1, SDHAF2, SDHA (sequence changes only), SDHB, SDHC, SDHD, SMAD4, SMARCA4, SMARCB1, SMARCE1, STK11, SUFU, TERT, TERT, TMEM127, TP53,  TSC1, TSC2, VHL, WRN and WT1.  The report date is January 19, 2018.     CHIEF COMPLIANT: Cycle 1 Taxol  INTERVAL HISTORY: Abena Erdman is a 42 year old with above-mentioned history of left breast cancer currently neoadjuvant chemotherapy with dose dense Adriamycin and Cytoxan and she completed 4 cycles of that and today cycle 1 of Taxol.  After the fourth cycle she felt nausea and vomiting that lasted for several days and she was also very fatigued and she slept most of the week.  This past week she was doing a lot better.  She also has constipation for which she takes a stool softener and laxative.  REVIEW OF SYSTEMS:   Constitutional: Denies fevers, chills or abnormal weight loss Eyes: Denies blurriness of vision Ears, nose, mouth, throat, and face: Denies mucositis or sore throat Respiratory: Denies cough, dyspnea or wheezes Cardiovascular: Denies palpitation, chest discomfort Gastrointestinal:  Denies nausea, heartburn or change in bowel habits Skin: Denies abnormal skin rashes Lymphatics: Denies new lymphadenopathy or easy bruising Neurological:Denies numbness, tingling or new weaknesses Behavioral/Psych: Mood is stable, no new changes  Extremities: No lower extremity edema   All other systems were reviewed with the patient and are negative.  I have reviewed the past medical history, past surgical history, social history and family history with the patient and they are unchanged from previous note.  ALLERGIES:  is allergic to sulfa antibiotics.  MEDICATIONS:  Current Outpatient Medications  Medication Sig Dispense Refill  . baclofen (LIORESAL) 10 MG tablet Take 10 mg by mouth as needed.    Marland Kitchen buPROPion (WELLBUTRIN) 75 MG tablet Take 75 mg by mouth 2 (two) times daily.    Marland Kitchen dexamethasone (DECADRON) 4 MG tablet Take  1 tablet day after chemotherapy and 1 tablet 2 days after chemotherapy with food 8 tablet 1  . EMGALITY 120 MG/ML SOAJ 120 mg every 30 (thirty) days.    Marland Kitchen  gabapentin (NEURONTIN) 300 MG capsule Take 1 capsule (300 mg total) by mouth at bedtime. 30 capsule 1  . lidocaine-prilocaine (EMLA) cream Apply to affected area once 30 g 3  . LORazepam (ATIVAN) 0.5 MG tablet TAKE 1 TABLET BY MOUTH EVERY 8 HOURS AS NEEDED 30 tablet 0  . Magnesium 500 MG CAPS Take 500 mg by mouth 2 (two) times daily.    . ondansetron (ZOFRAN) 8 MG tablet Take 1 tablet (8 mg total) by mouth 2 (two) times daily as needed. Start on the third day after chemotherapy. 30 tablet 1  . prochlorperazine (COMPAZINE) 10 MG tablet TAKE 1 TABLET BY MOUTH EVERY 6 HOURS AS NEEDED FOR NAUSEA OR VOMITING 90 tablet 0  . riboflavin (VITAMIN B-2) 100 MG TABS tablet Take 400 mg by mouth 2 (two) times daily.    . rizatriptan (MAXALT) 10 MG tablet Take 10 mg by mouth as needed for migraine. May repeat in 2 hours if needed    . traMADol (ULTRAM) 50 MG tablet Take 1 tablet (50 mg total) by mouth every 6 (six) hours as needed. 10 tablet 0   No current facility-administered medications for this visit.     PHYSICAL EXAMINATION: ECOG PERFORMANCE STATUS: 1 - Symptomatic but completely ambulatory  Vitals:   03/16/18 0927  BP: (!) 131/94  Pulse: 85  Resp: 17  Temp: 98 F (36.7 C)  SpO2: 99%   Filed Weights   03/16/18 0927  Weight: 146 lb 4.8 oz (66.4 kg)    GENERAL:alert, no distress and comfortable SKIN: skin color, texture, turgor are normal, no rashes or significant lesions EYES: normal, Conjunctiva are pink and non-injected, sclera clear OROPHARYNX:no exudate, no erythema and lips, buccal mucosa, and tongue normal  NECK: supple, thyroid normal size, non-tender, without nodularity LYMPH:  no palpable lymphadenopathy in the cervical, axillary or inguinal LUNGS: clear to auscultation and percussion with normal breathing effort HEART: regular rate & rhythm and no murmurs and no lower extremity edema ABDOMEN:abdomen soft, non-tender and normal bowel sounds MUSCULOSKELETAL:no cyanosis of digits  and no clubbing  NEURO: alert & oriented x 3 with fluent speech, no focal motor/sensory deficits EXTREMITIES: No lower extremity edema   LABORATORY DATA:  I have reviewed the data as listed CMP Latest Ref Rng & Units 03/02/2018 02/16/2018 02/01/2018  Glucose 70 - 99 mg/dL 97 98 109(H)  BUN 6 - 20 mg/dL 13 10 10   Creatinine 0.44 - 1.00 mg/dL 0.72 0.76 0.84  Sodium 135 - 145 mmol/L 140 143 142  Potassium 3.5 - 5.1 mmol/L 3.7 3.9 3.9  Chloride 98 - 111 mmol/L 106 109 106  CO2 22 - 32 mmol/L 26 25 27   Calcium 8.9 - 10.3 mg/dL 9.1 9.5 9.4  Total Protein 6.5 - 8.1 g/dL 6.9 7.3 7.2  Total Bilirubin 0.3 - 1.2 mg/dL <0.2(L) 0.2(L) 0.2(L)  Alkaline Phos 38 - 126 U/L 79 77 73  AST 15 - 41 U/L 21 20 18   ALT 0 - 44 U/L 38 37 30    Lab Results  Component Value Date   WBC 8.1 03/02/2018   HGB 12.0 03/02/2018   HCT 35.5 03/02/2018   MCV 89.3 03/02/2018   PLT 306 03/02/2018   NEUTROABS 6.5 03/02/2018    ASSESSMENT & PLAN:  Malignant neoplasm of upper-outer quadrant  of left breast in female, estrogen receptor positive ([place]) 01/02/2018: Palpable lump left breastsince December 2018, mammogram: Left breast UOQ 2.3 cm mass, axilla has several enlarged lymph nodes; 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  Recommendation: 1. Neoadjuvant chemotherapy with dose dense Adriamycin and Cytoxan followed by Taxol weekly x12 2. Followed by surgery (which could be mastectomy) withAXLND versusTAD 3. Followed by radiation 4. Followed by antiestrogen therapy  Review of scans: No evidence of metastatic disease by CT scans and bone scan. Breast MRI 01/10/2018: Necrotic mass 2.4 x 3.2 x 4 cm, 4 abnormal lymph nodes in the axilla ------------------------------------------------------------------------------ Current treatment: Completed 4 cycles ofdose dense Adriamycin and Cytoxan, today cycle 1  Taxol  Chemo toxicities: Monitoring closely for toxicities. Patient had profound fatigue after the last cycle of chemo that lasted the whole week and she was mostly resting in the bed. Constipation: Patient is taking stool softeners and laxatives as needed Nausea and vomiting: With a fourth cycle of AC it was quite profound.  Today she is feeling a lot better.  We will await to see how she does not Taxol.  Return to clinic weekly for Taxol and every other week for follow-up with me   No orders of the defined types were placed in this encounter.  The patient has a good understanding of the overall plan. she agrees with it. she will call with any problems that may develop before the next visit here.   Harriette Ohara, MD 03/16/18

## 2018-03-19 ENCOUNTER — Telehealth: Payer: Self-pay | Admitting: *Deleted

## 2018-03-19 NOTE — Telephone Encounter (Signed)
Spoke with pt today for chemo follow up.  Stated she had mild nausea on Saturday, but no vomiting.  Eating and drinking fine started Saturday.  Denied pain, bowel and bladder function fine.  No other complains . Pt aware of next appt on 03/23/18.   Pt understood to call office with any new problems.

## 2018-03-23 ENCOUNTER — Inpatient Hospital Stay: Payer: Commercial Managed Care - PPO

## 2018-03-23 VITALS — BP 131/81 | HR 97 | Temp 98.5°F | Resp 16

## 2018-03-23 DIAGNOSIS — Z95828 Presence of other vascular implants and grafts: Secondary | ICD-10-CM

## 2018-03-23 DIAGNOSIS — C50412 Malignant neoplasm of upper-outer quadrant of left female breast: Secondary | ICD-10-CM

## 2018-03-23 DIAGNOSIS — Z170 Estrogen receptor positive status [ER+]: Secondary | ICD-10-CM

## 2018-03-23 DIAGNOSIS — Z5111 Encounter for antineoplastic chemotherapy: Secondary | ICD-10-CM | POA: Diagnosis not present

## 2018-03-23 LAB — CMP (CANCER CENTER ONLY)
ALBUMIN: 4.3 g/dL (ref 3.5–5.0)
ALK PHOS: 58 U/L (ref 38–126)
ALT: 105 U/L — AB (ref 0–44)
AST: 44 U/L — ABNORMAL HIGH (ref 15–41)
Anion gap: 9 (ref 5–15)
BUN: 9 mg/dL (ref 6–20)
CALCIUM: 9.1 mg/dL (ref 8.9–10.3)
CO2: 24 mmol/L (ref 22–32)
CREATININE: 0.71 mg/dL (ref 0.44–1.00)
Chloride: 108 mmol/L (ref 98–111)
GFR, Est AFR Am: >60 mL/min (ref >60)
GFR, Estimated: >60 mL/min (ref >60)
GLUCOSE: 83 mg/dL (ref 70–99)
Potassium: 3.8 mmol/L (ref 3.5–5.1)
Sodium: 141 mmol/L (ref 135–145)
Total Bilirubin: 0.2 mg/dL — ABNORMAL LOW (ref 0.3–1.2)
Total Protein: 6.9 g/dL (ref 6.5–8.1)

## 2018-03-23 LAB — CBC WITH DIFFERENTIAL (CANCER CENTER ONLY)
Basophils Absolute: 0 10*3/uL (ref 0.0–0.1)
Basophils Relative: 1 %
EOS PCT: 2 %
Eosinophils Absolute: 0.1 10*3/uL (ref 0.0–0.5)
HCT: 34 % — ABNORMAL LOW (ref 34.8–46.6)
Hemoglobin: 11.6 g/dL (ref 11.6–15.9)
Lymphocytes Relative: 22 %
Lymphs Abs: 0.8 10*3/uL — ABNORMAL LOW (ref 0.9–3.3)
MCH: 30.6 pg (ref 25.1–34.0)
MCHC: 33.9 g/dL (ref 31.5–36.0)
MCV: 90.2 fL (ref 79.5–101.0)
MONO ABS: 0.3 10*3/uL (ref 0.1–0.9)
Monocytes Relative: 7 %
Neutro Abs: 2.6 10*3/uL (ref 1.5–6.5)
Neutrophils Relative %: 68 %
PLATELETS: 349 10*3/uL (ref 145–400)
RBC: 3.78 MIL/uL (ref 3.70–5.45)
RDW: 16.6 % — AB (ref 11.2–14.5)
WBC Count: 3.8 10*3/uL — ABNORMAL LOW (ref 3.9–10.3)

## 2018-03-23 MED ORDER — DEXAMETHASONE SODIUM PHOSPHATE 10 MG/ML IJ SOLN
10.0000 mg | Freq: Once | INTRAMUSCULAR | Status: AC
  Administered 2018-03-23: 10 mg via INTRAVENOUS

## 2018-03-23 MED ORDER — FAMOTIDINE IN NACL 20-0.9 MG/50ML-% IV SOLN
INTRAVENOUS | Status: AC
  Filled 2018-03-23: qty 50

## 2018-03-23 MED ORDER — SODIUM CHLORIDE 0.9 % IV SOLN
Freq: Once | INTRAVENOUS | Status: AC
  Administered 2018-03-23: 13:00:00 via INTRAVENOUS
  Filled 2018-03-23: qty 250

## 2018-03-23 MED ORDER — HEPARIN SOD (PORK) LOCK FLUSH 100 UNIT/ML IV SOLN
500.0000 [IU] | Freq: Once | INTRAVENOUS | Status: AC | PRN
  Administered 2018-03-23: 500 [IU]
  Filled 2018-03-23: qty 5

## 2018-03-23 MED ORDER — DEXAMETHASONE SODIUM PHOSPHATE 10 MG/ML IJ SOLN
INTRAMUSCULAR | Status: AC
  Filled 2018-03-23: qty 1

## 2018-03-23 MED ORDER — SODIUM CHLORIDE 0.9% FLUSH
10.0000 mL | INTRAVENOUS | Status: DC | PRN
  Administered 2018-03-23: 10 mL via INTRAVENOUS
  Filled 2018-03-23: qty 10

## 2018-03-23 MED ORDER — DIPHENHYDRAMINE HCL 50 MG/ML IJ SOLN
50.0000 mg | Freq: Once | INTRAMUSCULAR | Status: AC
  Administered 2018-03-23: 50 mg via INTRAVENOUS

## 2018-03-23 MED ORDER — SODIUM CHLORIDE 0.9 % IV SOLN
80.0000 mg/m2 | Freq: Once | INTRAVENOUS | Status: AC
  Administered 2018-03-23: 138 mg via INTRAVENOUS
  Filled 2018-03-23: qty 23

## 2018-03-23 MED ORDER — FAMOTIDINE IN NACL 20-0.9 MG/50ML-% IV SOLN
20.0000 mg | Freq: Once | INTRAVENOUS | Status: AC
  Administered 2018-03-23: 20 mg via INTRAVENOUS

## 2018-03-23 MED ORDER — DIPHENHYDRAMINE HCL 50 MG/ML IJ SOLN
INTRAMUSCULAR | Status: AC
  Filled 2018-03-23: qty 1

## 2018-03-23 MED ORDER — SODIUM CHLORIDE 0.9% FLUSH
10.0000 mL | INTRAVENOUS | Status: DC | PRN
  Administered 2018-03-23: 10 mL
  Filled 2018-03-23: qty 10

## 2018-03-23 NOTE — Patient Instructions (Signed)
[facility] Discharge Instructions for Patients Receiving Chemotherapy  Today you received the following chemotherapy agents:  Taxol.  To help prevent nausea and vomiting after your treatment, we encourage you to take your nausea medication as directed.   If you develop nausea and vomiting that is not controlled by your nausea medication, call the clinic.   BELOW ARE SYMPTOMS THAT SHOULD BE REPORTED IMMEDIATELY:  *FEVER GREATER THAN 100.5 F  *CHILLS WITH OR WITHOUT FEVER  NAUSEA AND VOMITING THAT IS NOT CONTROLLED WITH YOUR NAUSEA MEDICATION  *UNUSUAL SHORTNESS OF BREATH  *UNUSUAL BRUISING OR BLEEDING  TENDERNESS IN MOUTH AND THROAT WITH OR WITHOUT PRESENCE OF ULCERS  *URINARY PROBLEMS  *BOWEL PROBLEMS  UNUSUAL RASH Items with * indicate a potential emergency and should be followed up as soon as possible.  Feel free to call the clinic should you have any questions or concerns. The clinic phone number is [phone].  Please show the CHEMO ALERT CARD at check-in to the Emergency Department and triage nurse.   

## 2018-03-23 NOTE — Progress Notes (Signed)
Per Dr Lindi Adie OK to treat with ALT of 105

## 2018-03-28 ENCOUNTER — Inpatient Hospital Stay (HOSPITAL_BASED_OUTPATIENT_CLINIC_OR_DEPARTMENT_OTHER): Payer: Commercial Managed Care - PPO | Admitting: Medical

## 2018-03-28 ENCOUNTER — Telehealth: Payer: Self-pay | Admitting: *Deleted

## 2018-03-28 ENCOUNTER — Other Ambulatory Visit: Payer: Self-pay

## 2018-03-28 ENCOUNTER — Inpatient Hospital Stay: Payer: Commercial Managed Care - PPO | Attending: Hematology and Oncology

## 2018-03-28 VITALS — BP 135/95 | HR 90 | Temp 98.0°F | Resp 18 | Ht 65.0 in | Wt 146.6 lb

## 2018-03-28 DIAGNOSIS — Z452 Encounter for adjustment and management of vascular access device: Secondary | ICD-10-CM | POA: Diagnosis not present

## 2018-03-28 DIAGNOSIS — Z5111 Encounter for antineoplastic chemotherapy: Secondary | ICD-10-CM | POA: Diagnosis present

## 2018-03-28 DIAGNOSIS — C773 Secondary and unspecified malignant neoplasm of axilla and upper limb lymph nodes: Secondary | ICD-10-CM | POA: Diagnosis not present

## 2018-03-28 DIAGNOSIS — Z8041 Family history of malignant neoplasm of ovary: Secondary | ICD-10-CM | POA: Diagnosis not present

## 2018-03-28 DIAGNOSIS — C50412 Malignant neoplasm of upper-outer quadrant of left female breast: Secondary | ICD-10-CM

## 2018-03-28 DIAGNOSIS — R0602 Shortness of breath: Secondary | ICD-10-CM | POA: Diagnosis not present

## 2018-03-28 DIAGNOSIS — Z803 Family history of malignant neoplasm of breast: Secondary | ICD-10-CM | POA: Diagnosis not present

## 2018-03-28 DIAGNOSIS — R002 Palpitations: Secondary | ICD-10-CM | POA: Insufficient documentation

## 2018-03-28 DIAGNOSIS — Z170 Estrogen receptor positive status [ER+]: Secondary | ICD-10-CM

## 2018-03-28 DIAGNOSIS — Z800 Family history of malignant neoplasm of digestive organs: Secondary | ICD-10-CM | POA: Diagnosis not present

## 2018-03-28 DIAGNOSIS — R0789 Other chest pain: Secondary | ICD-10-CM | POA: Insufficient documentation

## 2018-03-28 DIAGNOSIS — R000 Tachycardia, unspecified: Secondary | ICD-10-CM | POA: Diagnosis not present

## 2018-03-28 LAB — CBC WITH DIFFERENTIAL (CANCER CENTER ONLY)
Basophils Absolute: 0 10*3/uL (ref 0.0–0.1)
Basophils Relative: 1 %
Eosinophils Absolute: 0.1 10*3/uL (ref 0.0–0.5)
Eosinophils Relative: 2 %
HCT: 36.1 % (ref 34.8–46.6)
Hemoglobin: 12.2 g/dL (ref 11.6–15.9)
Lymphocytes Relative: 20 %
Lymphs Abs: 0.7 10*3/uL — ABNORMAL LOW (ref 0.9–3.3)
MCH: 30.8 pg (ref 25.1–34.0)
MCHC: 33.8 g/dL (ref 31.5–36.0)
MCV: 91 fL (ref 79.5–101.0)
Monocytes Absolute: 0.2 10*3/uL (ref 0.1–0.9)
Monocytes Relative: 5 %
Neutro Abs: 2.7 10*3/uL (ref 1.5–6.5)
Neutrophils Relative %: 72 %
Platelet Count: 336 10*3/uL (ref 145–400)
RBC: 3.97 MIL/uL (ref 3.70–5.45)
RDW: 17.4 % — ABNORMAL HIGH (ref 11.2–14.5)
WBC Count: 3.7 10*3/uL — ABNORMAL LOW (ref 3.9–10.3)

## 2018-03-28 LAB — CMP (CANCER CENTER ONLY)
ALT: 201 U/L — ABNORMAL HIGH (ref 0–44)
AST: 98 U/L — ABNORMAL HIGH (ref 15–41)
Albumin: 4.4 g/dL (ref 3.5–5.0)
Alkaline Phosphatase: 62 U/L (ref 38–126)
Anion gap: 10 (ref 5–15)
BUN: 8 mg/dL (ref 6–20)
CO2: 27 mmol/L (ref 22–32)
Calcium: 9.8 mg/dL (ref 8.9–10.3)
Chloride: 105 mmol/L (ref 98–111)
Creatinine: 0.7 mg/dL (ref 0.44–1.00)
GFR, Est AFR Am: >60 mL/min
GFR, Estimated: >60 mL/min
Glucose, Bld: 97 mg/dL (ref 70–99)
Potassium: 4 mmol/L (ref 3.5–5.1)
Sodium: 142 mmol/L (ref 135–145)
Total Bilirubin: 0.5 mg/dL (ref 0.3–1.2)
Total Protein: 7.3 g/dL (ref 6.5–8.1)

## 2018-03-28 NOTE — Patient Instructions (Signed)
Sinus Tachycardia °Sinus tachycardia is a kind of fast heartbeat. In sinus tachycardia, the heart beats more than 100 times a minute. Sinus tachycardia starts in a part of the heart called the sinus node. Sinus tachycardia may be harmless, or it may be a sign of a serious condition. °What are the causes? °This condition may be caused by: °· Exercise or exertion. °· A fever. °· Pain. °· Loss of body fluids (dehydration). °· Severe bleeding (hemorrhage). °· Anxiety and stress. °· Certain substances, including: °? Alcohol. °? Caffeine. °? Tobacco and nicotine products. °? Diet pills. °? Illegal drugs. °· Medical conditions including: °? Heart disease. °? An infection. °? An overactive thyroid (hyperthyroidism). °? A lack of red blood cells (anemia). ° °What are the signs or symptoms? °Symptoms of this condition include: °· A feeling that the heart is beating quickly (palpitations). °· Suddenly noticing your heartbeat (cardiac awareness). °· Dizziness. °· Tiredness (fatigue). °· Shortness of breath. °· Chest pain. °· Nausea. °· Fainting. ° °How is this diagnosed? °This condition is diagnosed with: °· A physical exam. °· Other tests, such as: °? Blood tests. °? An electrocardiogram (ECG). This test measures the electrical activity of the heart. °? Holter monitoring. For this test, you wear a device that records your heartbeat for one or more days. ° °You may be referred to a heart specialist (cardiologist). °How is this treated? °Treatment for this condition depends on the cause or underlying condition. Treatment may involve: °· Treating the underlying condition. °· Taking new medicines or changing your current medicines as told by your health care provider. °· Making changes to your diet or lifestyle. °· Practicing relaxation methods. ° °Follow these instructions at home: °Lifestyle °· Do not use any products that contain nicotine or tobacco, such as cigarettes and e-cigarettes. If you need help quitting, ask your  health care provider. °· Learn relaxation methods, like deep breathing, to help you when you get stressed or anxious. °· Do not use illegal drugs, such as cocaine. °· Do not abuse alcohol. Limit alcohol intake to no more than 1 drink a day for non-pregnant women and 2 drinks a day for men. One drink equals 12 oz of beer, 5 oz of wine, or 1½ oz of hard liquor. °· Find time to rest and relax often. This reduces stress. °· Avoid: °? Caffeine. °? Stimulants such as over-the-counter diet pills or pills that help you to stay awake. °? Situations that cause anxiety or stress. °General instructions °· Drink enough fluids to keep your urine clear or pale yellow. °· Take over-the-counter and prescription medicines only as told by your health care provider. °· Keep all follow-up visits as told by your health care provider. This is important. °Contact a health care provider if: °· You have a fever. °· You have vomiting or diarrhea that keeps happening (is persistent). °Get help right away if: °· You have pain in your chest, upper arms, jaw, or neck. °· You become weak or dizzy. °· You feel faint. °· You have palpitations that do not go away. °This information is not intended to replace advice given to you by your health care provider. Make sure you discuss any questions you have with your health care provider. °Document Released: 08/18/2004 Document Revised: 02/06/2016 Document Reviewed: 01/23/2015 °Elsevier Interactive Patient Education © 2018 [employer] ° °

## 2018-03-28 NOTE — Progress Notes (Signed)
[facility] Progress Note   [name] [id number] April 27, 1976 42 y.o.  Kristi [name] is managed by Dr. Nicholas Lose  Actively treated with chemotherapy/immunotherapy: yes  Current Therapy: Paclitaxel  Last Treated: 03/23/2018 (cycle 2, day 1)  Assessment: Plan:    Shortness of breath - Plan: EKG 12-Lead  Tachycardia - Plan: EKG 12-Lead  Malignant neoplasm of upper-outer quadrant of left breast in female, estrogen receptor positive (HCC)   Shortness of breath and tachycardia: A referral will be placed to cardiology.  Cardiology has been contacted.  I am awaiting information on which cardiologist will be able to see the patient first.  An EKG was completed today which showed sinus tachycardia at 108 bpm.  ER positive malignant neoplasm of the left breast: The patient is status post cycle 2, day 1 of paclitaxel which was dosed on 03/23/2018.  Her treatment that is planned for 03/30/2018 will be held this week awaiting a consult with cardiology.  Please see After Visit Summary for patient specific instructions.  Future Appointments  Date Time Provider [place]  03/30/2018 10:15 AM [facility] LAB 5 [facility] None  03/30/2018 10:30 AM [facility] [place] FLUSH [facility] None  03/30/2018 11:00 AM Nicholas Lose, MD [facility] None  03/30/2018 12:00 PM [facility] INFUSION [facility] None  04/06/2018 10:45 AM [facility] ONLY [facility] None  04/06/2018 11:00 AM [facility] [place] FLUSH [facility] None  04/06/2018 12:00 PM [facility] INFUSION [facility] None  04/13/2018 10:45 AM [facility] ONLY [facility] None  04/13/2018 11:00 AM [facility] [place] FLUSH [facility] None  04/13/2018 11:30 AM Nicholas Lose, MD [facility] None  04/13/2018 12:15 PM [facility] INFUSION [facility] None  04/20/2018 10:45 AM [facility] ONLY [facility] None  04/20/2018 11:00 AM [facility] [place] FLUSH [facility] None  04/20/2018 12:00 PM [facility] INFUSION [facility] None  04/27/2018 10:15  AM [facility] LAB 4 [facility] None  04/27/2018 10:30 AM [facility] [place] FLUSH [facility] None  04/27/2018 11:00 AM Nicholas Lose, MD [facility] None  04/27/2018 12:00 PM [facility] INFUSION [facility] None  05/04/2018 10:45 AM [facility] ONLY [facility] None  05/04/2018 11:00 AM [facility] [place] FLUSH [facility] None  05/04/2018 12:00 PM [facility] INFUSION [facility] None  05/11/2018 11:00 AM [facility] ONLY [facility] None  05/11/2018 11:15 AM [facility] [place] FLUSH [facility] None  05/11/2018 11:45 AM Nicholas Lose, MD [facility] None  05/11/2018  1:00 PM [facility] INFUSION [facility] None  05/18/2018 10:45 AM [facility] ONLY [facility] None  05/18/2018 11:00 AM [facility] [place] FLUSH [facility] None  05/18/2018 12:00 PM [facility] INFUSION [facility] None  05/25/2018 10:45 AM [facility] ONLY [facility] None  05/25/2018 11:00 AM [facility] [place] FLUSH [facility] None  05/25/2018 11:30 AM Cause[place], [name], NP [facility] None  05/25/2018 12:15 PM [facility] INFUSION [facility] None  06/01/2018 10:45 AM [facility] ONLY [facility] None  06/01/2018 11:00 AM [facility] [place] FLUSH [facility] None  06/01/2018 12:00 PM [facility] INFUSION [facility] None    Orders Placed This Encounter  Procedures  . EKG 12-Lead       Subjective:   Patient ID:  Kristi [name] is a 42 y.o. (DOB 11/14/1975) female.  Chief Complaint:  Chief Complaint  Patient presents with  . Palpitations    HPI Kristi [name] is a 42 year old female with a history of a clinical stage IIb, ER/PR positive malignant neoplasm of the left breast who is status post cycle 2 of paclitaxel which was dosed on 03/23/2018 under the direction of Dr. Lindi Adie.  Ms. Winquist has had a history of palpitations prior to her diagnosis.  Since  last Friday she has noted chest tightness, chest heaviness which is increased with movement and deep breathing.  She is also noted that her heart rate has ranged from 102 to 114 with  activity.  She has had some shortness of breath and dyspnea on exertion, dizziness, and generalized weakness when she has episodes of palpitations.  She has ongoing hot flashes.  She denies nausea, vomiting, diarrhea, fevers, chills, and has had no falls.  She continues to work.  Her weight is stable.  Medications: I have reviewed the patient's current medications.  Allergies:  Allergies  Allergen Reactions  . Sulfa Antibiotics Other (See Comments)    SEVERE HEADACHE    Past Medical History:  Diagnosis Date  . Breast cancer, left ([place]) 12/2017  . Dental crowns present   . Endometriosis   . Family history of breast cancer   . Family history of ovarian cancer   . Family history of pancreatic cancer   . Family history of prostate cancer   . Migraines     Past Surgical History:  Procedure Laterality Date  . OVARIAN CYST REMOVAL    . PORTACATH PLACEMENT Right 01/15/2018   Procedure: INSERTION PORT-A-CATH WITH [place];  Surgeon: Rolm Bookbinder, MD;  Location: [facility];  Service: General;  Laterality: Right;    Family History  Problem Relation Age of Onset  . Prostate cancer Father 41       Stage 1  . Ovarian cancer Maternal Aunt 19       ? Germ cell?  . Prostate cancer Maternal Uncle 65       prostectomy  . Pancreatic cancer Maternal Grandmother 44  . Colon cancer Maternal Grandfather 60  . Heart attack Paternal Grandmother   . Prostate cancer Paternal Grandfather        dx in his 34s, d. 59s-90s  . Breast cancer Other        MGMs sister dx over 49    Social History   Socioeconomic History  . Marital status: Single    Spouse name: Not on file  . Number of children: Not on file  . Years of education: Not on file  . Highest education level: Not on file  Occupational History  . Not on file  Social Needs  . Financial resource strain: Not on file  . Food insecurity:    Worry: Not on file    Inability: Not on file  . Transportation needs:    Medical:  Not on file    Non-medical: Not on file  Tobacco Use  . Smoking status: Never Smoker  . Smokeless tobacco: Never Used  Substance and Sexual Activity  . Alcohol use: Yes    Comment: occasionally  . Drug use: Never  . Sexual activity: Not on file  Lifestyle  . Physical activity:    Days per week: Not on file    Minutes per session: Not on file  . Stress: Not on file  Relationships  . Social connections:    Talks on phone: Not on file    Gets together: Not on file    Attends religious service: Not on file    Active member of club or organization: Not on file    Attends meetings of clubs or organizations: Not on file    Relationship status: Not on file  . Intimate partner violence:    Fear of current or ex partner: Not on file    Emotionally abused: Not on file  Physically abused: Not on file    Forced sexual activity: Not on file  Other Topics Concern  . Not on file  Social History Narrative  . Not on file    Past Medical History, Surgical history, Social history, and Family history were reviewed and updated as appropriate.   Please see review of systems for further details on the patient's review from today.   Review of Systems:  Review of Systems  Constitutional: Negative for appetite change, chills, diaphoresis and fever.  HENT: Negative for dental problem, mouth sores and trouble swallowing.   Respiratory: Positive for shortness of breath. Negative for cough and chest tightness.        Dyspnea on exertion  Cardiovascular: Positive for palpitations. Negative for chest pain.  Gastrointestinal: Negative for constipation, diarrhea, nausea and vomiting.  Neurological: Positive for dizziness and weakness. Negative for syncope and headaches.    Objective:   Physical Exam:  BP (!) 135/95 (BP Location: Left Arm, Patient Position: Sitting) Comment: Liza RN is aware  Pulse 90   Temp 98 F (36.7 C) (Oral)   Resp 18   Ht 5\' 5"  (1.651 m)   Wt 146 lb 9.6 oz (66.5 kg)    SpO2 100%   BMI 24.40 kg/m  ECOG: 0  Physical Exam  Constitutional: No distress.  HENT:  Head: Normocephalic and atraumatic.  Cardiovascular: Regular rhythm and normal heart sounds. Tachycardia present. Exam reveals no friction rub.  No murmur heard. Pulmonary/Chest: Effort normal and breath sounds normal. No respiratory distress. She has no wheezes. She has no rales.  Neurological: She is alert.  Skin: Skin is warm and dry. No rash noted. She is not diaphoretic. No erythema.    Lab Review:     Component Value Date/Time   NA 142 03/28/2018 1220   K 4.0 03/28/2018 1220   CL 105 03/28/2018 1220   CO2 27 03/28/2018 1220   GLUCOSE 97 03/28/2018 1220   BUN 8 03/28/2018 1220   CREATININE 0.70 03/28/2018 1220   CALCIUM 9.8 03/28/2018 1220   PROT 7.3 03/28/2018 1220   ALBUMIN 4.4 03/28/2018 1220   AST 98 (H) 03/28/2018 1220   ALT 201 (H) 03/28/2018 1220   ALKPHOS 62 03/28/2018 1220   BILITOT 0.5 03/28/2018 1220   GFRNONAA >60 03/28/2018 1220   GFRAA >60 03/28/2018 1220       Component Value Date/Time   WBC 3.7 (L) 03/28/2018 1220   RBC 3.97 03/28/2018 1220   HGB 12.2 03/28/2018 1220   HCT 36.1 03/28/2018 1220   PLT 336 03/28/2018 1220   MCV 91.0 03/28/2018 1220   MCH 30.8 03/28/2018 1220   MCHC 33.8 03/28/2018 1220   RDW 17.4 (H) 03/28/2018 1220   LYMPHSABS 0.7 (L) 03/28/2018 1220   MONOABS 0.2 03/28/2018 1220   EOSABS 0.1 03/28/2018 1220   BASOSABS 0.0 03/28/2018 1220   -------------------------------  Imaging from last 24 hours (if applicable):  Radiology interpretation: No results found.      This case was discussed with Dr. Lindi Adie. He expresses agreement with my management of this patient.

## 2018-03-28 NOTE — Progress Notes (Signed)
Pt presents today with tachycardia at rest and with activity.  Reports tightness/heaviness in chest that increases with activity and deep breaths.  Denies outright CP or dizziness/weakness.  Reports DOE with minor activity.  Afebrile.  A&Ox4.  EKG performed by RN Learta Codding and LPN Corene Cornea, given to PA Lucianne Lei.

## 2018-03-28 NOTE — Telephone Encounter (Signed)
Verbal order received and read back from Dr, Lindi Adie for [facility] to be seen in [place] today.  Order given to [facility] at this time to come in for 12:30 lab, 1:00 visit.  Scheduling message sent.  "Last week I started having heart fluttering.  Today when going up stairs at work I was short of breath and felt chest tightening.  My pulse was 114.  I checked again and it's down to 102.  Is this a side effect of the taxol or should I be worried about this?  I have experienced flutter randomly but this occurs more and more frequently this morning."

## 2018-03-29 ENCOUNTER — Other Ambulatory Visit: Payer: Self-pay | Admitting: Medical

## 2018-03-29 DIAGNOSIS — R0602 Shortness of breath: Secondary | ICD-10-CM

## 2018-03-29 DIAGNOSIS — R0609 Other forms of dyspnea: Secondary | ICD-10-CM

## 2018-03-29 DIAGNOSIS — R000 Tachycardia, unspecified: Secondary | ICD-10-CM

## 2018-03-29 DIAGNOSIS — R0600 Dyspnea, unspecified: Secondary | ICD-10-CM

## 2018-03-30 ENCOUNTER — Inpatient Hospital Stay: Payer: Commercial Managed Care - PPO

## 2018-03-30 ENCOUNTER — Inpatient Hospital Stay: Payer: Commercial Managed Care - PPO | Admitting: Hematology and Oncology

## 2018-03-30 ENCOUNTER — Ambulatory Visit (HOSPITAL_COMMUNITY)
Admission: RE | Admit: 2018-03-30 | Discharge: 2018-03-30 | Disposition: A | Discharge status: Home / Self Care | Payer: Commercial Managed Care - PPO | Source: Ambulatory Visit | Attending: Hematology and Oncology | Admitting: Hematology and Oncology

## 2018-03-30 ENCOUNTER — Telehealth: Payer: Self-pay | Admitting: *Deleted

## 2018-03-30 DIAGNOSIS — R0602 Shortness of breath: Secondary | ICD-10-CM | POA: Diagnosis not present

## 2018-03-30 DIAGNOSIS — R000 Tachycardia, unspecified: Secondary | ICD-10-CM | POA: Diagnosis present

## 2018-03-30 DIAGNOSIS — R0600 Dyspnea, unspecified: Secondary | ICD-10-CM

## 2018-03-30 DIAGNOSIS — Z853 Personal history of malignant neoplasm of breast: Secondary | ICD-10-CM | POA: Insufficient documentation

## 2018-03-30 DIAGNOSIS — R0609 Other forms of dyspnea: Secondary | ICD-10-CM | POA: Insufficient documentation

## 2018-03-30 NOTE — Progress Notes (Signed)
  Echocardiogram 2D Echocardiogram has been performed.  Kristi [name] 03/30/2018, 10:19 AM

## 2018-03-30 NOTE — Telephone Encounter (Signed)
Per Dr. Lindi Adie no Taxol this week 9/6. Appts cancelled and pt notified. Scheduled lab and f/u appt on 9/12 with chemo on 9/13. Confirmed appts with pt.

## 2018-04-02 ENCOUNTER — Ambulatory Visit (HOSPITAL_COMMUNITY)
Admission: RE | Admit: 2018-04-02 | Discharge: 2018-04-02 | Disposition: A | Discharge status: Home / Self Care | Payer: Commercial Managed Care - PPO | Source: Ambulatory Visit | Attending: Cardiology | Admitting: Cardiology

## 2018-04-02 ENCOUNTER — Other Ambulatory Visit: Payer: Self-pay

## 2018-04-02 ENCOUNTER — Encounter (HOSPITAL_COMMUNITY): Payer: Self-pay | Admitting: Cardiology

## 2018-04-02 VITALS — BP 120/84 | HR 95 | Wt 147.2 lb

## 2018-04-02 DIAGNOSIS — R002 Palpitations: Secondary | ICD-10-CM

## 2018-04-02 DIAGNOSIS — R0781 Pleurodynia: Secondary | ICD-10-CM | POA: Diagnosis present

## 2018-04-02 DIAGNOSIS — C50912 Malignant neoplasm of unspecified site of left female breast: Secondary | ICD-10-CM | POA: Insufficient documentation

## 2018-04-02 DIAGNOSIS — Z170 Estrogen receptor positive status [ER+]: Secondary | ICD-10-CM | POA: Diagnosis not present

## 2018-04-02 DIAGNOSIS — R590 Localized enlarged lymph nodes: Secondary | ICD-10-CM | POA: Insufficient documentation

## 2018-04-02 DIAGNOSIS — Z79899 Other long term (current) drug therapy: Secondary | ICD-10-CM | POA: Insufficient documentation

## 2018-04-02 DIAGNOSIS — R0600 Dyspnea, unspecified: Secondary | ICD-10-CM

## 2018-04-02 DIAGNOSIS — R000 Tachycardia, unspecified: Secondary | ICD-10-CM | POA: Insufficient documentation

## 2018-04-02 MED ORDER — HEPARIN SOD (PORK) LOCK FLUSH 100 UNIT/ML IV SOLN: 500.0000 [IU] | INTRAVENOUS | Status: DC | PRN

## 2018-04-02 MED ORDER — IOPAMIDOL (ISOVUE-370) INJECTION 76%
INTRAVENOUS | Status: AC
  Filled 2018-04-02: qty 100

## 2018-04-02 MED ORDER — IOPAMIDOL (ISOVUE-370) INJECTION 76%
100.0000 mL | Freq: Once | INTRAVENOUS | Status: AC | PRN
  Administered 2018-04-02: 100 mL via INTRAVENOUS

## 2018-04-02 NOTE — Patient Instructions (Signed)
CT of chest  Your physician has recommended that you wear a holter monitor. Holter monitors are medical devices that record the heart's electrical activity. Doctors most often use these monitors to diagnose arrhythmias. Arrhythmias are problems with the speed or rhythm of the heartbeat. The monitor is a small, portable device. You can wear one while you do your normal daily activities. This is usually used to diagnose what is causing palpitations/syncope (passing out).  Your physician recommends that you schedule a follow-up appointment in: 1 month

## 2018-04-02 NOTE — Progress Notes (Signed)
Oncology: Dr. Lindi Adie  42 yo with history of breast cancer presents was referred by Dr. Lindi Adie for evaluation of dyspnea/chest pain/tachycardia.  She was diagnosed with left breast cancer in 6/19, ER+/PR+/HER2 equivocal.  In 6/19, she started Adriamycin/Cytoxan and has completed 4 cycles.  She is now getting paclitaxel for 12 cycles.  She will need surgery eventually.   For about 10 days, since her last paclitaxel infusion, she has noted chest tightness/heaviness that comes and goes. It is exacerbated by deep breathing. She has been tachycardic.  She has had a cough.  She has also had new dyspnea for about 10 days => she is short of breath walking up stairs/inclines or walking fast.  She has felt her heart fluttering for most of the last 10 days, this is bothersome.  No orthopnea/PND.  No lightheadedness, orthopnea, PND.    Given the above symptoms, CT chest to rule out PE was done today.  There was no evidence for PE or PNA.    ECG (03/28/18, personally reviewed): sinus tachy 108, otherwise normal  PMH: 1. Endometriosis.  2. Breast cancer: She was diagnosed with left breast cancer in 6/19, ER+/PR+/HER2 equivocal.  In 6/19, she started Adriamycin/Cytoxan and has completed 4 cycles.  She is now getting paclitaxel for 12 cycles.  She will need surgery eventually.  - Echo (6/19): EF 60-65%.  - Echo (9/19): EF 73-41%, normal diastolic function, normal RV (no change from 6/19).   FH: Grandfather with DVT.  No premature CAD or CHF.   Social History   Socioeconomic History  . Marital status: Single    Spouse name: Not on file  . Number of children: Not on file  . Years of education: Not on file  . Highest education level: Not on file  Occupational History  . Not on file  Social Needs  . Financial resource strain: Not on file  . Food insecurity:    Worry: Not on file    Inability: Not on file  . Transportation needs:    Medical: Not on file    Non-medical: Not on file  Tobacco Use  .  Smoking status: Never Smoker  . Smokeless tobacco: Never Used  Substance and Sexual Activity  . Alcohol use: Yes    Comment: occasionally  . Drug use: Never  . Sexual activity: Not on file  Lifestyle  . Physical activity:    Days per week: Not on file    Minutes per session: Not on file  . Stress: Not on file  Relationships  . Social connections:    Talks on phone: Not on file    Gets together: Not on file    Attends religious service: Not on file    Active member of club or organization: Not on file    Attends meetings of clubs or organizations: Not on file    Relationship status: Not on file  . Intimate partner violence:    Fear of current or ex partner: Not on file    Emotionally abused: Not on file    Physically abused: Not on file    Forced sexual activity: Not on file  Other Topics Concern  . Not on file  Social History Narrative  . Not on file   ROS: All systems reviewed and negative except as per HPI.   Current Outpatient Medications  Medication Sig Dispense Refill  . baclofen (LIORESAL) 10 MG tablet Take 10 mg by mouth as needed.    Marland Kitchen buPROPion (WELLBUTRIN) 75 MG  tablet Take 75 mg by mouth 2 (two) times daily.    Marland Kitchen dexamethasone (DECADRON) 4 MG tablet Take 1 tablet day after chemotherapy and 1 tablet 2 days after chemotherapy with food 8 tablet 1  . EMGALITY 120 MG/ML SOAJ 120 mg every 30 (thirty) days.    Marland Kitchen gabapentin (NEURONTIN) 300 MG capsule Take 1 capsule (300 mg total) by mouth at bedtime. 30 capsule 1  . lidocaine-prilocaine (EMLA) cream Apply to affected area once 30 g 3  . LORazepam (ATIVAN) 0.5 MG tablet TAKE 1 TABLET BY MOUTH EVERY 8 HOURS AS NEEDED 30 tablet 0  . Magnesium 500 MG CAPS Take 500 mg by mouth 2 (two) times daily.    . ondansetron (ZOFRAN) 8 MG tablet Take 1 tablet (8 mg total) by mouth 2 (two) times daily as needed. Start on the third day after chemotherapy. 30 tablet 1  . prochlorperazine (COMPAZINE) 10 MG tablet TAKE 1 TABLET BY MOUTH  EVERY 6 HOURS AS NEEDED FOR NAUSEA OR VOMITING 90 tablet 0  . riboflavin (VITAMIN B-2) 100 MG TABS tablet Take 400 mg by mouth 2 (two) times daily.    . rizatriptan (MAXALT) 10 MG tablet Take 10 mg by mouth as needed for migraine. May repeat in 2 hours if needed    . traMADol (ULTRAM) 50 MG tablet Take 1 tablet (50 mg total) by mouth every 6 (six) hours as needed. 10 tablet 0   No current facility-administered medications for this encounter.    Facility-Administered Medications Ordered in Other Encounters  Medication Dose Route Frequency Provider Last Rate Last Dose  . heparin lock flush 100 unit/mL  500 Units Intracatheter Prior to discharge Larey Dresser, MD      . iopamidol (ISOVUE-370) 76 % injection            BP 120/84   Pulse 95   Wt 66.8 kg (147 lb 4 oz)   SpO2 100%   BMI 24.50 kg/m  General: NAD Neck: No JVD, no thyromegaly or thyroid nodule.  Lungs: Clear to auscultation bilaterally with normal respiratory effort. CV: Nondisplaced PMI.  Heart regular S1/S2, no S3/S4, no murmur.  No peripheral edema.  No carotid bruit.  Normal pedal pulses.  Abdomen: Soft, nontender, no hepatosplenomegaly, no distention.  Skin: Intact without lesions or rashes.  Neurologic: Alert and oriented x 3.  Psych: Normal affect. Extremities: No clubbing or cyanosis.  HEENT: Normal.   Assessment/Plan: 1. Dypsnea/chest pain: Initially, I was concerned for PE given her h/o malignancy, pleuritic chest pain/dyspnea, and tachycardia.  However, CTA chest for PE was negative today.  There was also no PNA.  On exam she is not volume overloaded and the recent echo was normal. I am not sure what is causing her symptoms unless it is a reaction to tachycardia.  2. Palpitations: Much worse for the last 10 days.  No lightheadedness/syncope.  ECG has shown mild sinus tachycardia only. Palpitations are a large part of her symptomatology.  - I will arrange for a Zio patch for 3 days to assess her rhythm.  3. Breast  cancer: She has completed Adriamycin-based treatment.  Echo in 9/19 was reviewed and appeared normal, no change from pre-op echo.  - She will need a repeat echo in 6 more months to assess for any late effects of Adriamycin.   Followup in 1 month.   Loralie Champagne 04/03/2018

## 2018-04-04 NOTE — Assessment & Plan Note (Addendum)
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  Recommendation: 1. Neoadjuvant chemotherapy with dose dense Adriamycin and Cytoxan followed by Taxol weekly x12, switched to Abraxane after first cycle because of Taxol reaction causing severe palpitations 2. Followed by surgery (which could be mastectomy) withAXLND versusTAD 3. Followed by radiation 4. Followed by antiestrogen therapy  Review of scans: No evidence of metastatic disease by CT scans and bone scan. Breast MRI 01/10/2018: Necrotic mass 2.4 x 3.2 x 4 cm, 4 abnormal lymph nodes in the axilla ------------------------------------------------------------------------------ Current treatment: Completed 4 cycles ofdose dense Adriamycin and Cytoxan, after first cycle of Taxol she had a reaction to Taxol, switched to Abraxane starting 04/13/2018  Chemo toxicities:  Severe chest pressure and palpitations: Reaction to Taxol, seen cardiology I recommended switching her from Taxol to Abraxane from next week. She had a CT chest which was negative for PE. Heart monitor is being connected today.  Return to clinic  on 04/13/2018 for follow-up with me.

## 2018-04-05 ENCOUNTER — Telehealth: Payer: Self-pay | Admitting: Hematology and Oncology

## 2018-04-05 ENCOUNTER — Inpatient Hospital Stay: Payer: Commercial Managed Care - PPO

## 2018-04-05 ENCOUNTER — Inpatient Hospital Stay (HOSPITAL_BASED_OUTPATIENT_CLINIC_OR_DEPARTMENT_OTHER): Payer: Commercial Managed Care - PPO | Admitting: Hematology and Oncology

## 2018-04-05 ENCOUNTER — Ambulatory Visit (HOSPITAL_COMMUNITY)
Admission: RE | Admit: 2018-04-05 | Discharge: 2018-04-05 | Disposition: A | Discharge status: Home / Self Care | Payer: Commercial Managed Care - PPO | Source: Ambulatory Visit | Attending: Cardiology | Admitting: Cardiology

## 2018-04-05 VITALS — BP 145/88 | HR 108 | Temp 98.6°F | Resp 18 | Ht 65.0 in | Wt 147.7 lb

## 2018-04-05 DIAGNOSIS — Z170 Estrogen receptor positive status [ER+]: Secondary | ICD-10-CM

## 2018-04-05 DIAGNOSIS — C773 Secondary and unspecified malignant neoplasm of axilla and upper limb lymph nodes: Secondary | ICD-10-CM

## 2018-04-05 DIAGNOSIS — R002 Palpitations: Secondary | ICD-10-CM

## 2018-04-05 DIAGNOSIS — R0789 Other chest pain: Secondary | ICD-10-CM

## 2018-04-05 DIAGNOSIS — C50412 Malignant neoplasm of upper-outer quadrant of left female breast: Secondary | ICD-10-CM

## 2018-04-05 DIAGNOSIS — Z5111 Encounter for antineoplastic chemotherapy: Secondary | ICD-10-CM | POA: Diagnosis not present

## 2018-04-05 LAB — CMP (CANCER CENTER ONLY)
ALT: 110 U/L — ABNORMAL HIGH (ref 0–44)
AST: 41 U/L (ref 15–41)
Albumin: 4.3 g/dL (ref 3.5–5.0)
Alkaline Phosphatase: 63 U/L (ref 38–126)
Anion gap: 11 (ref 5–15)
BILIRUBIN TOTAL: 0.5 mg/dL (ref 0.3–1.2)
BUN: 11 mg/dL (ref 6–20)
CO2: 25 mmol/L (ref 22–32)
CREATININE: 0.81 mg/dL (ref 0.44–1.00)
Calcium: 9.6 mg/dL (ref 8.9–10.3)
Chloride: 104 mmol/L (ref 98–111)
GFR, Est AFR Am: >60 mL/min (ref >60)
Glucose, Bld: 122 mg/dL — ABNORMAL HIGH (ref 70–99)
POTASSIUM: 3.7 mmol/L (ref 3.5–5.1)
Sodium: 140 mmol/L (ref 135–145)
Total Protein: 7.3 g/dL (ref 6.5–8.1)

## 2018-04-05 LAB — CBC WITH DIFFERENTIAL (CANCER CENTER ONLY)
BASOS PCT: 1 %
Basophils Absolute: 0 10*3/uL (ref 0.0–0.1)
EOS ABS: 0.2 10*3/uL (ref 0.0–0.5)
EOS PCT: 4 %
HEMATOCRIT: 38.2 % (ref 34.8–46.6)
HEMOGLOBIN: 12.9 g/dL (ref 11.6–15.9)
LYMPHS PCT: 19 %
Lymphs Abs: 0.7 10*3/uL — ABNORMAL LOW (ref 0.9–3.3)
MCH: 30.8 pg (ref 25.1–34.0)
MCHC: 33.8 g/dL (ref 31.5–36.0)
MCV: 91.1 fL (ref 79.5–101.0)
Monocytes Absolute: 0.3 10*3/uL (ref 0.1–0.9)
Monocytes Relative: 8 %
Neutro Abs: 2.5 10*3/uL (ref 1.5–6.5)
Neutrophils Relative %: 68 %
Platelet Count: 298 10*3/uL (ref 145–400)
RBC: 4.19 MIL/uL (ref 3.70–5.45)
RDW: 16.2 % — AB (ref 11.2–14.5)
WBC: 3.7 10*3/uL — AB (ref 3.9–10.3)

## 2018-04-05 NOTE — Progress Notes (Signed)
Patient Care Team: [name] as PCP - General (Physician Assistant)  DIAGNOSIS:  Encounter Diagnosis  Name Primary?  . Malignant neoplasm of upper-outer quadrant of left breast in female, estrogen receptor positive ([place]) Yes    SUMMARY OF ONCOLOGIC HISTORY:   Malignant neoplasm of upper-outer quadrant of left breast in female, estrogen receptor positive ([place])   01/02/2018 Initial Diagnosis    Palpable lump left breast, mammogram: Left breast UOQ 2.3 cm mass, axilla has several enlarged lymph nodes; 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    01/04/2018 Cancer Staging    Staging form: Breast, AJCC 8th Edition - Clinical: Stage IIB (cT2, cN1, cM0, G3, ER+, PR+, HER2: Equivocal) - Signed by Nicholas Lose, MD on 01/04/2018    01/16/2018 -  Neo-Adjuvant Chemotherapy    Dose dense Adriamycin and Cytoxan x4 followed by Taxol, switched to Abraxane with cycle 2 after Taxol reaction with palpitations weekly x12    01/19/2018 Genetic Testing    STK11 c.608C>T VUS identified on the Multicancer panel.  The Multi-Gene Panel offered by [facility] includes sequencing and/or deletion duplication testing of the following 83 genes: ALK, APC, ATM, AXIN2,BAP1,  BARD1, BLM, BMPR1A, BRCA1, BRCA2, BRIP1, CASR, CDC73, CDH1, CDK4, CDKN1B, CDKN1C, CDKN2A (p14ARF), CDKN2A (p16INK4a), CEBPA, CHEK2, CTNNA1, DICER1, DIS3L2, EGFR (c.2369C>T, p.Thr790Met variant only), EPCAM (Deletion/duplication testing only), FH, FLCN, GATA2, GPC3, GREM1 (Promoter region deletion/duplication testing only), HOXB13 (c.251G>A, p.Gly84Glu), HRAS, KIT, MAX, MEN1, MET, MITF (c.952G>A, p.Glu318Lys variant only), MLH1, MSH2, MSH3, MSH6, MUTYH, NBN, NF1, NF2, NTHL1, PALB2, PDGFRA, PHOX2B, PMS2, POLD1, POLE, POT1, PRKAR1A, PTCH1, PTEN, RAD50, RAD51C, RAD51D, RB1, RECQL4, RET, RUNX1, SDHAF2, SDHA (sequence changes only), SDHB, SDHC, SDHD,  SMAD4, SMARCA4, SMARCB1, SMARCE1, STK11, SUFU, TERT, TERT, TMEM127, TP53, TSC1, TSC2, VHL, WRN and WT1.  The report date is January 19, 2018.     CHIEF COMPLIANT: Follow-up to discuss Taxol reaction  INTERVAL HISTORY: Analie Katzman is a 42 year old with above-mentioned history of left breast cancer currently on neoadjuvant chemotherapy and completed 4 cycles of dose dense Adriamycin and Cytoxan with some toxicities.  After first cycle of Taxol she had chest tightness and palpitations that have continued to persist even for 10 days.  We referred her to Dr. Aundra Dubin who performed a CT of her chest to rule out a PE.  There was no evidence of PE or pneumonia and she is currently being evaluated for a monitor to be yesterday.  She continues to have the chest tightness although the flutter and palpitations were not as severe as they were originally.  We had to hold chemotherapy this week because of this issue.  REVIEW OF SYSTEMS:   Constitutional: Denies fevers, chills or abnormal weight loss Eyes: Denies blurriness of vision Ears, nose, mouth, throat, and face: Denies mucositis or sore throat Respiratory: Denies cough, dyspnea or wheezes Cardiovascular: Palpitations and chest pressure Gastrointestinal:  Denies nausea, heartburn or change in bowel habits Skin: Denies abnormal skin rashes Lymphatics: Denies new lymphadenopathy or easy bruising Neurological:Denies numbness, tingling or new weaknesses Behavioral/Psych: Mood is stable, no new changes  Extremities: No lower extremity edema   All other systems were reviewed with the patient and are negative.  I have reviewed the past medical history, past surgical history, social history and family history with the patient and they are unchanged from previous note.  ALLERGIES:  is allergic to sulfa antibiotics.  MEDICATIONS:  Current Outpatient Medications  Medication Sig Dispense Refill  . baclofen (LIORESAL) 10 MG tablet Take 10 mg by mouth as  needed.    Marland Kitchen buPROPion (WELLBUTRIN) 75 MG tablet Take 75 mg by mouth 2 (two) times daily.    Marland Kitchen EMGALITY 120 MG/ML SOAJ 120 mg every 30 (thirty) days.    Marland Kitchen gabapentin (NEURONTIN) 300 MG capsule Take 1 capsule (300 mg total) by mouth at bedtime. 30 capsule 1  . Magnesium 500 MG CAPS Take 500 mg by mouth 2 (two) times daily.    . riboflavin (VITAMIN B-2) 100 MG TABS tablet Take 400 mg by mouth 2 (two) times daily.    . rizatriptan (MAXALT) 10 MG tablet Take 10 mg by mouth as needed for migraine. May repeat in 2 hours if needed    . traMADol (ULTRAM) 50 MG tablet Take 1 tablet (50 mg total) by mouth every 6 (six) hours as needed. 10 tablet 0   No current facility-administered medications for this visit.     PHYSICAL EXAMINATION: ECOG PERFORMANCE STATUS: 1 - Symptomatic but completely ambulatory  Vitals:   04/05/18 0815  BP: (!) 145/88  Pulse: (!) 108  Resp: 18  Temp: 98.6 F (37 C)  SpO2: 100%   Filed Weights   04/05/18 0815  Weight: 147 lb 11.2 oz (67 kg)    GENERAL:alert, no distress and comfortable SKIN: skin color, texture, turgor are normal, no rashes or significant lesions EYES: normal, Conjunctiva are pink and non-injected, sclera clear OROPHARYNX:no exudate, no erythema and lips, buccal mucosa, and tongue normal  NECK: supple, thyroid normal size, non-tender, without nodularity LYMPH:  no palpable lymphadenopathy in the cervical, axillary or inguinal LUNGS: clear to auscultation and percussion with normal breathing effort HEART: Tachycardia ABDOMEN:abdomen soft, non-tender and normal bowel sounds MUSCULOSKELETAL:no cyanosis of digits and no clubbing  NEURO: alert & oriented x 3 with fluent speech, no focal motor/sensory deficits EXTREMITIES: No lower extremity edema   LABORATORY DATA:  I have reviewed the data as listed CMP Latest Ref Rng & Units 03/28/2018 03/23/2018 03/16/2018  Glucose 70 - 99 mg/dL 97 83 86  BUN 6 - 20 mg/dL _0 Creatinine 0.44 - 1.00 mg/dL 0.70  0.71 0.70  Sodium 135 - 145 mmol/L 142 141 141  Potassium 3.5 - 5.1 mmol/L 4.0 3.8 3.8  Chloride 98 - 111 mmol/L 105 108 105  CO2 22 - 32 mmol/L _1 Calcium 8.9 - 10.3 mg/dL 9.8 9.1 9.5  Total Protein 6.5 - 8.1 g/dL 7.3 6.9 7.2  Total Bilirubin 0.3 - 1.2 mg/dL 0.5 0.2(L) <0.2(L)  Alkaline Phos 38 - 126 U/L 62 58 83  AST 15 - 41 U/L 98(H) 44(H) 23  ALT 0 - 44 U/L 201(H) 105(H) 38    Lab Results  Component Value Date   WBC 3.7 (L) 04/05/2018   HGB 12.9 04/05/2018   HCT 38.2 04/05/2018   MCV 91.1 04/05/2018   PLT 298 04/05/2018   NEUTROABS 2.5 04/05/2018    ASSESSMENT & PLAN:  Malignant neoplasm of upper-outer quadrant of left breast in female, estrogen receptor positive ([place]) 01/02/2018: Palpable lump left breastsince December 2018, mammogram: Left breast UOQ 2.3 cm mass, axilla has several enlarged lymph nodes; 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  Recommendation: 1. Neoadjuvant chemotherapy with dose dense Adriamycin and Cytoxan followed by Taxol weekly x12, switched to  Abraxane after first cycle because of Taxol reaction causing severe palpitations 2. Followed by surgery (which could be mastectomy) withAXLND versusTAD 3. Followed by radiation 4. Followed by antiestrogen therapy  Review of scans: No evidence of metastatic disease by CT scans and bone scan. Breast MRI 01/10/2018: Necrotic mass 2.4 x 3.2 x 4 cm, 4 abnormal lymph nodes in the axilla ------------------------------------------------------------------------------ Current treatment: Completed 4 cycles ofdose dense Adriamycin and Cytoxan, after first cycle of Taxol she had a reaction to Taxol, switched to Abraxane starting 04/13/2018  Chemo toxicities:  Severe chest pressure and palpitations: Reaction to Taxol, seen cardiology I recommended switching her from Taxol to Abraxane from next week. She  had a CT chest which was negative for PE. Heart monitor is being connected today.  Return to clinic  on 04/13/2018 for follow-up with me.    Orders Placed This Encounter  Procedures  . CBC with Differential (Cancer Center Only)    Standing Status:   Standing    Number of Occurrences:   20    Standing Expiration Date:   04/06/2019  . CMP ([facility] only)    Standing Status:   Standing    Number of Occurrences:   20    Standing Expiration Date:   04/06/2019   The patient has a good understanding of the overall plan. she agrees with it. she will call with any problems that may develop before the next visit here.   Harriette Ohara, MD 04/05/18

## 2018-04-05 NOTE — Telephone Encounter (Signed)
Per 9/12 los already done

## 2018-04-05 NOTE — Progress Notes (Signed)
START OFF PATHWAY REGIMEN - Breast   [id number]:Nab-Paclitaxel (Abraxane(R)) 100 mg/m2 D1,8,15 q28 days:   A cycle is every 28 days (3 weeks on and 1 week off):     Nab-paclitaxel (protein bound)   **Always confirm dose/schedule in your pharmacy ordering system**  Patient Characteristics: Preoperative or Nonsurgical Candidate (Clinical Staging), Neoadjuvant Therapy followed by Surgery, Invasive Disease, Chemotherapy, HER2 Negative/Unknown/Equivocal, ER Positive Therapeutic Status: Preoperative or Nonsurgical Candidate (Clinical Staging) AJCC M Category: cM0 AJCC Grade: G3 Breast Surgical Plan: Neoadjuvant Therapy followed by Surgery ER Status: Positive (+) AJCC 8 Stage Grouping: IIB HER2 Status: Negative (-) AJCC T Category: cT2 AJCC N Category: cN1 PR Status: Positive (+) Intent of Therapy: Curative Intent, Discussed with Patient

## 2018-04-06 ENCOUNTER — Inpatient Hospital Stay: Payer: Commercial Managed Care - PPO

## 2018-04-06 ENCOUNTER — Other Ambulatory Visit: Payer: Commercial Managed Care - PPO

## 2018-04-13 ENCOUNTER — Inpatient Hospital Stay: Payer: Commercial Managed Care - PPO

## 2018-04-13 ENCOUNTER — Encounter: Payer: Self-pay | Admitting: *Deleted

## 2018-04-13 ENCOUNTER — Inpatient Hospital Stay (HOSPITAL_BASED_OUTPATIENT_CLINIC_OR_DEPARTMENT_OTHER): Payer: Commercial Managed Care - PPO | Admitting: Hematology and Oncology

## 2018-04-13 ENCOUNTER — Telehealth: Payer: Self-pay | Admitting: Hematology and Oncology

## 2018-04-13 DIAGNOSIS — C773 Secondary and unspecified malignant neoplasm of axilla and upper limb lymph nodes: Secondary | ICD-10-CM

## 2018-04-13 DIAGNOSIS — Z170 Estrogen receptor positive status [ER+]: Secondary | ICD-10-CM

## 2018-04-13 DIAGNOSIS — R002 Palpitations: Secondary | ICD-10-CM

## 2018-04-13 DIAGNOSIS — C50412 Malignant neoplasm of upper-outer quadrant of left female breast: Secondary | ICD-10-CM

## 2018-04-13 DIAGNOSIS — Z95828 Presence of other vascular implants and grafts: Secondary | ICD-10-CM

## 2018-04-13 DIAGNOSIS — Z5111 Encounter for antineoplastic chemotherapy: Secondary | ICD-10-CM | POA: Diagnosis not present

## 2018-04-13 LAB — CBC WITH DIFFERENTIAL (CANCER CENTER ONLY)
BASOS ABS: 0 10*3/uL (ref 0.0–0.1)
Basophils Relative: 1 %
EOS PCT: 5 %
Eosinophils Absolute: 0.3 10*3/uL (ref 0.0–0.5)
HEMATOCRIT: 37.6 % (ref 34.8–46.6)
HEMOGLOBIN: 12.5 g/dL (ref 11.6–15.9)
LYMPHS ABS: 1.1 10*3/uL (ref 0.9–3.3)
LYMPHS PCT: 23 %
MCH: 30.4 pg (ref 25.1–34.0)
MCHC: 33.2 g/dL (ref 31.5–36.0)
MCV: 91.5 fL (ref 79.5–101.0)
Monocytes Absolute: 0.3 10*3/uL (ref 0.1–0.9)
Monocytes Relative: 6 %
NEUTROS ABS: 3.2 10*3/uL (ref 1.5–6.5)
NEUTROS PCT: 65 %
Platelet Count: 267 10*3/uL (ref 145–400)
RBC: 4.11 MIL/uL (ref 3.70–5.45)
RDW: 14.5 % (ref 11.2–14.5)
WBC: 4.9 10*3/uL (ref 3.9–10.3)

## 2018-04-13 LAB — CMP (CANCER CENTER ONLY)
ALK PHOS: 63 U/L (ref 38–126)
ALT: 65 U/L — AB (ref 0–44)
AST: 29 U/L (ref 15–41)
Albumin: 4.3 g/dL (ref 3.5–5.0)
Anion gap: 10 (ref 5–15)
BUN: 11 mg/dL (ref 6–20)
CALCIUM: 9.4 mg/dL (ref 8.9–10.3)
CHLORIDE: 108 mmol/L (ref 98–111)
CO2: 24 mmol/L (ref 22–32)
CREATININE: 0.76 mg/dL (ref 0.44–1.00)
Glucose, Bld: 87 mg/dL (ref 70–99)
Potassium: 3.8 mmol/L (ref 3.5–5.1)
Sodium: 142 mmol/L (ref 135–145)
Total Bilirubin: 0.3 mg/dL (ref 0.3–1.2)
Total Protein: 7 g/dL (ref 6.5–8.1)

## 2018-04-13 MED ORDER — SODIUM CHLORIDE 0.9% FLUSH
10.0000 mL | INTRAVENOUS | Status: DC | PRN
  Filled 2018-04-13: qty 10

## 2018-04-13 MED ORDER — GOSERELIN ACETATE 3.6 MG ~~LOC~~ IMPL
3.6000 mg | DRUG_IMPLANT | Freq: Once | SUBCUTANEOUS | Status: AC
  Administered 2018-04-13: 3.6 mg via SUBCUTANEOUS

## 2018-04-13 MED ORDER — ALTEPLASE 2 MG IJ SOLR
2.0000 mg | Freq: Once | INTRAMUSCULAR | Status: AC | PRN
  Administered 2018-04-13: 2 mg
  Filled 2018-04-13: qty 2

## 2018-04-13 MED ORDER — GOSERELIN ACETATE 3.6 MG ~~LOC~~ IMPL
DRUG_IMPLANT | SUBCUTANEOUS | Status: AC
  Filled 2018-04-13: qty 3.6

## 2018-04-13 MED ORDER — ALTEPLASE 2 MG IJ SOLR
INTRAMUSCULAR | Status: AC
  Filled 2018-04-13: qty 2

## 2018-04-13 MED ORDER — PROCHLORPERAZINE MALEATE 10 MG PO TABS
ORAL_TABLET | ORAL | Status: AC
  Filled 2018-04-13: qty 1

## 2018-04-13 MED ORDER — PROCHLORPERAZINE MALEATE 10 MG PO TABS
10.0000 mg | ORAL_TABLET | Freq: Once | ORAL | Status: AC
  Administered 2018-04-13: 10 mg via ORAL

## 2018-04-13 MED ORDER — SODIUM CHLORIDE 0.9% FLUSH
10.0000 mL | INTRAVENOUS | Status: AC | PRN
  Administered 2018-04-13 (×2): 10 mL via INTRAVENOUS
  Filled 2018-04-13: qty 10

## 2018-04-13 MED ORDER — HEPARIN SOD (PORK) LOCK FLUSH 100 UNIT/ML IV SOLN
500.0000 [IU] | Freq: Once | INTRAVENOUS | Status: AC | PRN
  Administered 2018-04-13: 500 [IU]
  Filled 2018-04-13: qty 5

## 2018-04-13 MED ORDER — PACLITAXEL PROTEIN-BOUND CHEMO INJECTION 100 MG
80.0000 mg/m2 | Freq: Once | INTRAVENOUS | Status: AC
  Administered 2018-04-13: 150 mg via INTRAVENOUS
  Filled 2018-04-13: qty 30

## 2018-04-13 MED ORDER — SODIUM CHLORIDE 0.9 % IV SOLN
Freq: Once | INTRAVENOUS | Status: AC
  Administered 2018-04-13: 13:00:00 via INTRAVENOUS
  Filled 2018-04-13: qty 250

## 2018-04-13 NOTE — Progress Notes (Signed)
Patient Care Team: [name] as PCP - General (Physician Assistant)  DIAGNOSIS:  Encounter Diagnosis  Name Primary?  . Malignant neoplasm of upper-outer quadrant of left breast in female, estrogen receptor positive ([employer])     SUMMARY OF ONCOLOGIC HISTORY:   Malignant neoplasm of upper-outer quadrant of left breast in female, estrogen receptor positive ([place])   01/02/2018 Initial Diagnosis    Palpable lump left breast, mammogram: Left breast UOQ 2.3 cm mass, axilla has several enlarged lymph nodes; 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    01/04/2018 Cancer Staging    Staging form: Breast, AJCC 8th Edition - Clinical: Stage IIB (cT2, cN1, cM0, G3, ER+, PR+, HER2: Equivocal) - Signed by Nicholas Lose, MD on 01/04/2018    01/16/2018 -  Neo-Adjuvant Chemotherapy    Dose dense Adriamycin and Cytoxan x4 followed by Taxol, switched to Abraxane with cycle 2 after Taxol reaction with palpitations weekly x12    01/19/2018 Genetic Testing    STK11 c.608C>T VUS identified on the Multicancer panel.  The Multi-Gene Panel offered by [facility] includes sequencing and/or deletion duplication testing of the following 83 genes: ALK, APC, ATM, AXIN2,BAP1,  BARD1, BLM, BMPR1A, BRCA1, BRCA2, BRIP1, CASR, CDC73, CDH1, CDK4, CDKN1B, CDKN1C, CDKN2A (p14ARF), CDKN2A (p16INK4a), CEBPA, CHEK2, CTNNA1, DICER1, DIS3L2, EGFR (c.2369C>T, p.Thr790Met variant only), EPCAM (Deletion/duplication testing only), FH, FLCN, GATA2, GPC3, GREM1 (Promoter region deletion/duplication testing only), HOXB13 (c.251G>A, p.Gly84Glu), HRAS, KIT, MAX, MEN1, MET, MITF (c.952G>A, p.Glu318Lys variant only), MLH1, MSH2, MSH3, MSH6, MUTYH, NBN, NF1, NF2, NTHL1, PALB2, PDGFRA, PHOX2B, PMS2, POLD1, POLE, POT1, PRKAR1A, PTCH1, PTEN, RAD50, RAD51C, RAD51D, RB1, RECQL4, RET, RUNX1, SDHAF2, SDHA (sequence changes only), SDHB, SDHC, SDHD,  SMAD4, SMARCA4, SMARCB1, SMARCE1, STK11, SUFU, TERT, TERT, TMEM127, TP53, TSC1, TSC2, VHL, WRN and WT1.  The report date is January 19, 2018.     CHIEF COMPLIANT: Abraxane chemotherapy  INTERVAL HISTORY: Kristi [name] is a 41 year old with above-mentioned history of left breast cancer currently on neoadjuvant chemotherapy.  She had a reaction to Taxol and we discontinued it.  Today she is here to receive Abraxane.  Given that she had 2 Taxol's, this will be cycle 3.  Her palpitations have not decreased significantly.  She had a cardiac monitor and we do not know the results of that yet.  She denies any nausea or vomiting.  Energy levels are also good.  REVIEW OF SYSTEMS:   Constitutional: Denies fevers, chills or abnormal weight loss Eyes: Denies blurriness of vision Ears, nose, mouth, throat, and face: Denies mucositis or sore throat Respiratory: Denies cough, dyspnea or wheezes Cardiovascular: Improved palpitations Gastrointestinal:  Denies nausea, heartburn or change in bowel habits Skin: Denies abnormal skin rashes Lymphatics: Denies new lymphadenopathy or easy bruising Neurological:Denies numbness, tingling or new weaknesses Behavioral/Psych: Mood is stable, no new changes  Extremities: No lower extremity edema   All other systems were reviewed with the patient and are negative.  I have reviewed the past medical history, past surgical history, social history and family history with the patient and they are unchanged from previous note.  ALLERGIES:  is allergic to sulfa antibiotics.  MEDICATIONS:  Current Outpatient Medications  Medication Sig Dispense Refill  . baclofen (LIORESAL) 10 MG tablet Take 10 mg by mouth as needed.    Marland Kitchen buPROPion (WELLBUTRIN) 75 MG tablet Take 75 mg by mouth 2 (two) times daily.    Marland Kitchen EMGALITY  120 MG/ML SOAJ 120 mg every 30 (thirty) days.    Marland Kitchen gabapentin (NEURONTIN) 300 MG capsule Take 1 capsule (300 mg total) by mouth at bedtime. 30 capsule 1  .  Magnesium 500 MG CAPS Take 500 mg by mouth 2 (two) times daily.    . riboflavin (VITAMIN B-2) 100 MG TABS tablet Take 400 mg by mouth 2 (two) times daily.    . rizatriptan (MAXALT) 10 MG tablet Take 10 mg by mouth as needed for migraine. May repeat in 2 hours if needed    . traMADol (ULTRAM) 50 MG tablet Take 1 tablet (50 mg total) by mouth every 6 (six) hours as needed. 10 tablet 0   No current facility-administered medications for this visit.    Facility-Administered Medications Ordered in Other Visits  Medication Dose Route Frequency Provider Last Rate Last Dose  . sodium chloride flush (NS) 0.9 % injection 10 mL  10 mL Intravenous PRN Nicholas Lose, MD   10 mL at 04/13/18 1057    PHYSICAL EXAMINATION: ECOG PERFORMANCE STATUS: 1 - Symptomatic but completely ambulatory  Vitals:   04/13/18 1137  BP: 129/90  Pulse: 100  Resp: 17  Temp: 98.6 F (37 C)  SpO2: 100%   Filed Weights   04/13/18 1137  Weight: 146 lb 1.6 oz (66.3 kg)    GENERAL:alert, no distress and comfortable SKIN: skin color, texture, turgor are normal, no rashes or significant lesions EYES: normal, Conjunctiva are pink and non-injected, sclera clear OROPHARYNX:no exudate, no erythema and lips, buccal mucosa, and tongue normal  NECK: supple, thyroid normal size, non-tender, without nodularity LYMPH:  no palpable lymphadenopathy in the cervical, axillary or inguinal LUNGS: clear to auscultation and percussion with normal breathing effort HEART: regular rate & rhythm and no murmurs and no lower extremity edema ABDOMEN:abdomen soft, non-tender and normal bowel sounds MUSCULOSKELETAL:no cyanosis of digits and no clubbing  NEURO: alert & oriented x 3 with fluent speech, no focal motor/sensory deficits EXTREMITIES: No lower extremity edema   LABORATORY DATA:  I have reviewed the data as listed CMP Latest Ref Rng & Units 04/13/2018 04/05/2018 03/28/2018  Glucose 70 - 99 mg/dL 87 122(H) 97  BUN 6 - 20 mg/dL _0 Creatinine 0.44 - 1.00 mg/dL 0.76 0.81 0.70  Sodium 135 - 145 mmol/L 142 140 142  Potassium 3.5 - 5.1 mmol/L 3.8 3.7 4.0  Chloride 98 - 111 mmol/L 108 104 105  CO2 22 - 32 mmol/L _1 Calcium 8.9 - 10.3 mg/dL 9.4 9.6 9.8  Total Protein 6.5 - 8.1 g/dL 7.0 7.3 7.3  Total Bilirubin 0.3 - 1.2 mg/dL 0.3 0.5 0.5  Alkaline Phos 38 - 126 U/L 63 63 62  AST 15 - 41 U/L 29 41 98(H)  ALT 0 - 44 U/L 65(H) 110(H) 201(H)    Lab Results  Component Value Date   WBC 4.9 04/13/2018   HGB 12.5 04/13/2018   HCT 37.6 04/13/2018   MCV 91.5 04/13/2018   PLT 267 04/13/2018   NEUTROABS 3.2 04/13/2018    ASSESSMENT & PLAN:  Malignant neoplasm of upper-outer quadrant of left breast in female, estrogen receptor positive ([place]) /05/2018: Palpable lump left breastsince December 2018, mammogram: Left breast UOQ 2.3 cm mass, axilla has several enlarged lymph nodes; 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  Recommendation: 1. Neoadjuvant chemotherapy with dose dense Adriamycin and  Cytoxan followed by Taxol weekly x12, switched to Abraxane after first cycle because of Taxol reaction causing severe palpitations 2. Followed by surgery (which could be mastectomy) withAXLND versusTAD 3. Followed by radiation 4. Followed by antiestrogen therapy  Review of scans: No evidence of metastatic disease by CT scans and bone scan. Breast MRI 01/10/2018: Necrotic mass 2.4 x 3.2 x 4 cm, 4 abnormal lymph nodes in the axilla ------------------------------------------------------------------------------ Current treatment:Completed 4 cycles ofdose dense Adriamycin and Cytoxan, after receiving Taxol, she had a reaction and we switched to Abraxane starting 04/13/2018 this will be cycle 3 Patient will receive Zoladex injection today. Monitoring closely for toxicities Return to clinic in 1 week for follow-up with  me   No orders of the defined types were placed in this encounter.  The patient has a good understanding of the overall plan. she agrees with it. she will call with any problems that may develop before the next visit here.   Harriette Ohara, MD 04/13/18

## 2018-04-13 NOTE — Patient Instructions (Signed)
[facility] Discharge Instructions for Patients Receiving Chemotherapy  Today you received the following chemotherapy agents paclitaxel albumin-bound (Abraxane)  To help prevent nausea and vomiting after your treatment, we encourage you to take your nausea medication as directed by your doctor.   If you develop nausea and vomiting that is not controlled by your nausea medication, call the clinic.   BELOW ARE SYMPTOMS THAT SHOULD BE REPORTED IMMEDIATELY:  *FEVER GREATER THAN 100.5 F  *CHILLS WITH OR WITHOUT FEVER  NAUSEA AND VOMITING THAT IS NOT CONTROLLED WITH YOUR NAUSEA MEDICATION  *UNUSUAL SHORTNESS OF BREATH  *UNUSUAL BRUISING OR BLEEDING  TENDERNESS IN MOUTH AND THROAT WITH OR WITHOUT PRESENCE OF ULCERS  *URINARY PROBLEMS  *BOWEL PROBLEMS  UNUSUAL RASH Items with * indicate a potential emergency and should be followed up as soon as possible.  Feel free to call the clinic should you have any questions or concerns. The clinic phone number is [phone].  Please show the [facility] at check-in to the Emergency Department and triage nurse.  Nanoparticle Albumin-Bound Paclitaxel injection What is this medicine? NANOPARTICLE ALBUMIN-BOUND PACLITAXEL (Na no PAHR ti kuhl al BYOO muhn-bound PAK li TAX el) is a chemotherapy drug. It targets fast dividing cells, like cancer cells, and causes these cells to die. This medicine is used to treat advanced breast cancer and advanced lung cancer. This medicine may be used for other purposes; ask your health care provider or pharmacist if you have questions. COMMON BRAND NAME(S): Abraxane What should I tell my health care provider before I take this medicine? They need to know if you have any of these conditions: -kidney disease -liver disease -low blood counts, like low platelets, red blood cells, or white blood cells -recent or ongoing radiation therapy -an unusual or allergic reaction to paclitaxel, albumin,  other chemotherapy, other medicines, foods, dyes, or preservatives -pregnant or trying to get pregnant -breast-feeding How should I use this medicine? This drug is given as an infusion into a vein. It is administered in a hospital or clinic by a specially trained health care professional. Talk to your pediatrician regarding the use of this medicine in children. Special care may be needed. Overdosage: If you think you have taken too much of this medicine contact a poison control center or emergency room at once. NOTE: This medicine is only for you. Do not share this medicine with others. What if I miss a dose? It is important not to miss your dose. Call your doctor or health care professional if you are unable to keep an appointment. What may interact with this medicine? -cyclosporine -diazepam -ketoconazole -medicines to increase blood counts like filgrastim, pegfilgrastim, sargramostim -other chemotherapy drugs like cisplatin, doxorubicin, epirubicin, etoposide, teniposide, vincristine -quinidine -testosterone -vaccines -verapamil Talk to your doctor or health care professional before taking any of these medicines: -acetaminophen -aspirin -ibuprofen -ketoprofen -naproxen This list may not describe all possible interactions. Give your health care provider a list of all the medicines, herbs, non-prescription drugs, or dietary supplements you use. Also tell them if you smoke, drink alcohol, or use illegal drugs. Some items may interact with your medicine. What should I watch for while using this medicine? Your condition will be monitored carefully while you are receiving this medicine. You will need important blood work done while you are taking this medicine. This medicine can cause serious allergic reactions. If you experience allergic reactions like skin rash, itching or hives, swelling of the face, lips, or tongue, tell your  doctor or health care professional right away. In some  cases, you may be given additional medicines to help with side effects. Follow all directions for their use. This drug may make you feel generally unwell. This is not uncommon, as chemotherapy can affect healthy cells as well as cancer cells. Report any side effects. Continue your course of treatment even though you feel ill unless your doctor tells you to stop. Call your doctor or health care professional for advice if you get a fever, chills or sore throat, or other symptoms of a cold or flu. Do not treat yourself. This drug decreases your body's ability to fight infections. Try to avoid being around people who are sick. This medicine may increase your risk to bruise or bleed. Call your doctor or health care professional if you notice any unusual bleeding. Be careful brushing and flossing your teeth or using a toothpick because you may get an infection or bleed more easily. If you have any dental work done, tell your dentist you are receiving this medicine. Avoid taking products that contain aspirin, acetaminophen, ibuprofen, naproxen, or ketoprofen unless instructed by your doctor. These medicines may hide a fever. Do not become pregnant while taking this medicine. Women should inform their doctor if they wish to become pregnant or think they might be pregnant. There is a potential for serious side effects to an unborn child. Talk to your health care professional or pharmacist for more information. Do not breast-feed an infant while taking this medicine. Men are advised not to father a child while receiving this medicine. What side effects may I notice from receiving this medicine? Side effects that you should report to your doctor or health care professional as soon as possible: -allergic reactions like skin rash, itching or hives, swelling of the face, lips, or tongue -low blood counts - This drug may decrease the number of white blood cells, red blood cells and platelets. You may be at increased  risk for infections and bleeding. -signs of infection - fever or chills, cough, sore throat, pain or difficulty passing urine -signs of decreased platelets or bleeding - bruising, pinpoint red spots on the skin, black, tarry stools, nosebleeds -signs of decreased red blood cells - unusually weak or tired, fainting spells, lightheadedness -breathing problems -changes in vision -chest pain -high or low blood pressure -mouth sores -nausea and vomiting -pain, swelling, redness or irritation at the injection site -pain, tingling, numbness in the hands or feet -slow or irregular heartbeat -swelling of the ankle, feet, hands Side effects that usually do not require medical attention (report to your doctor or health care professional if they continue or are bothersome): -aches, pains -changes in the color of fingernails -diarrhea -hair loss -loss of appetite This list may not describe all possible side effects. Call your doctor for medical advice about side effects. You may report side effects to FDA at 1-800-FDA-1088. Where should I keep my medicine? This drug is given in a hospital or clinic and will not be stored at home. NOTE: This sheet is a summary. It may not cover all possible information. If you have questions about this medicine, talk to your doctor, pharmacist, or health care provider.  2018 Elsevier/Gold Standard (2015-05-13 10:05:20)

## 2018-04-13 NOTE — Telephone Encounter (Signed)
VG PAL 10/4 - moved from 10/4 to 10/3 - tx remains 10/4. Spoke with patient.

## 2018-04-13 NOTE — Assessment & Plan Note (Signed)
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  Recommendation: 1. Neoadjuvant chemotherapy with dose dense Adriamycin and Cytoxan followed by Taxol weekly x12, switched to Abraxane after first cycle because of Taxol reaction causing severe palpitations 2. Followed by surgery (which could be mastectomy) withAXLND versusTAD 3. Followed by radiation 4. Followed by antiestrogen therapy  Review of scans: No evidence of metastatic disease by CT scans and bone scan. Breast MRI 01/10/2018: Necrotic mass 2.4 x 3.2 x 4 cm, 4 abnormal lymph nodes in the axilla ------------------------------------------------------------------------------ Current treatment:Completed 4 cycles ofdose dense Adriamycin and Cytoxan, after first cycle of Taxol she had a reaction to Taxol, switched to Abraxane starting 04/13/2018  Monitoring closely for toxicities Return to clinic in 1 week for follow-up with me

## 2018-04-20 ENCOUNTER — Inpatient Hospital Stay: Payer: Commercial Managed Care - PPO

## 2018-04-20 VITALS — BP 130/91 | HR 96 | Temp 98.1°F | Resp 18

## 2018-04-20 DIAGNOSIS — Z95828 Presence of other vascular implants and grafts: Secondary | ICD-10-CM

## 2018-04-20 DIAGNOSIS — Z170 Estrogen receptor positive status [ER+]: Secondary | ICD-10-CM

## 2018-04-20 DIAGNOSIS — C50412 Malignant neoplasm of upper-outer quadrant of left female breast: Secondary | ICD-10-CM

## 2018-04-20 DIAGNOSIS — Z5111 Encounter for antineoplastic chemotherapy: Secondary | ICD-10-CM | POA: Diagnosis not present

## 2018-04-20 LAB — CMP (CANCER CENTER ONLY)
ALT: 88 U/L — ABNORMAL HIGH (ref 0–44)
ANION GAP: 10 (ref 5–15)
AST: 39 U/L (ref 15–41)
Albumin: 4.5 g/dL (ref 3.5–5.0)
Alkaline Phosphatase: 65 U/L (ref 38–126)
BILIRUBIN TOTAL: 0.4 mg/dL (ref 0.3–1.2)
BUN: 13 mg/dL (ref 6–20)
CO2: 27 mmol/L (ref 22–32)
Calcium: 9.9 mg/dL (ref 8.9–10.3)
Chloride: 105 mmol/L (ref 98–111)
Creatinine: 0.71 mg/dL (ref 0.44–1.00)
GFR, Estimated: >60 mL/min (ref >60)
Glucose, Bld: 83 mg/dL (ref 70–99)
POTASSIUM: 3.9 mmol/L (ref 3.5–5.1)
Sodium: 142 mmol/L (ref 135–145)
TOTAL PROTEIN: 7.2 g/dL (ref 6.5–8.1)

## 2018-04-20 LAB — CBC WITH DIFFERENTIAL (CANCER CENTER ONLY)
Basophils Absolute: 0 10*3/uL (ref 0.0–0.1)
Basophils Relative: 1 %
EOS PCT: 5 %
Eosinophils Absolute: 0.2 10*3/uL (ref 0.0–0.5)
HCT: 37 % (ref 34.8–46.6)
Hemoglobin: 12.5 g/dL (ref 11.6–15.9)
LYMPHS PCT: 27 %
Lymphs Abs: 1 10*3/uL (ref 0.9–3.3)
MCH: 30.6 pg (ref 25.1–34.0)
MCHC: 33.7 g/dL (ref 31.5–36.0)
MCV: 90.9 fL (ref 79.5–101.0)
Monocytes Absolute: 0.2 10*3/uL (ref 0.1–0.9)
Monocytes Relative: 4 %
NEUTROS ABS: 2.3 10*3/uL (ref 1.5–6.5)
Neutrophils Relative %: 63 %
PLATELETS: 257 10*3/uL (ref 145–400)
RBC: 4.07 MIL/uL (ref 3.70–5.45)
RDW: 14.9 % — ABNORMAL HIGH (ref 11.2–14.5)
WBC: 3.7 10*3/uL — AB (ref 3.9–10.3)

## 2018-04-20 MED ORDER — SODIUM CHLORIDE 0.9% FLUSH
10.0000 mL | Freq: Once | INTRAVENOUS | Status: AC
  Administered 2018-04-20: 10 mL
  Filled 2018-04-20: qty 10

## 2018-04-20 MED ORDER — PACLITAXEL PROTEIN-BOUND CHEMO INJECTION 100 MG
80.0000 mg/m2 | Freq: Once | INTRAVENOUS | Status: AC
  Administered 2018-04-20: 150 mg via INTRAVENOUS
  Filled 2018-04-20: qty 30

## 2018-04-20 MED ORDER — HEPARIN SOD (PORK) LOCK FLUSH 100 UNIT/ML IV SOLN
500.0000 [IU] | Freq: Once | INTRAVENOUS | Status: AC | PRN
  Administered 2018-04-20: 500 [IU]
  Filled 2018-04-20: qty 5

## 2018-04-20 MED ORDER — SODIUM CHLORIDE 0.9% FLUSH
10.0000 mL | INTRAVENOUS | Status: DC | PRN
  Administered 2018-04-20: 10 mL
  Filled 2018-04-20: qty 10

## 2018-04-20 MED ORDER — SODIUM CHLORIDE 0.9 % IV SOLN
Freq: Once | INTRAVENOUS | Status: AC
  Administered 2018-04-20: 12:00:00 via INTRAVENOUS
  Filled 2018-04-20: qty 250

## 2018-04-20 MED ORDER — PROCHLORPERAZINE MALEATE 10 MG PO TABS
10.0000 mg | ORAL_TABLET | Freq: Once | ORAL | Status: AC
  Administered 2018-04-20: 10 mg via ORAL

## 2018-04-20 MED ORDER — PROCHLORPERAZINE MALEATE 10 MG PO TABS
ORAL_TABLET | ORAL | Status: AC
  Filled 2018-04-20: qty 1

## 2018-04-20 NOTE — Patient Instructions (Signed)
[facility] Discharge Instructions for Patients Receiving Chemotherapy  Today you received the following chemotherapy agents:  Abraxane.  To help prevent nausea and vomiting after your treatment, we encourage you to take your nausea medication as directed.   If you develop nausea and vomiting that is not controlled by your nausea medication, call the clinic.   BELOW ARE SYMPTOMS THAT SHOULD BE REPORTED IMMEDIATELY:  *FEVER GREATER THAN 100.5 F  *CHILLS WITH OR WITHOUT FEVER  NAUSEA AND VOMITING THAT IS NOT CONTROLLED WITH YOUR NAUSEA MEDICATION  *UNUSUAL SHORTNESS OF BREATH  *UNUSUAL BRUISING OR BLEEDING  TENDERNESS IN MOUTH AND THROAT WITH OR WITHOUT PRESENCE OF ULCERS  *URINARY PROBLEMS  *BOWEL PROBLEMS  UNUSUAL RASH Items with * indicate a potential emergency and should be followed up as soon as possible.  Feel free to call the clinic should you have any questions or concerns. The clinic phone number is [phone].  Please show the CHEMO ALERT CARD at check-in to the Emergency Department and triage nurse.   

## 2018-04-26 ENCOUNTER — Inpatient Hospital Stay: Payer: Commercial Managed Care - PPO | Attending: Hematology and Oncology

## 2018-04-26 ENCOUNTER — Inpatient Hospital Stay (HOSPITAL_BASED_OUTPATIENT_CLINIC_OR_DEPARTMENT_OTHER): Payer: Commercial Managed Care - PPO | Admitting: Hematology and Oncology

## 2018-04-26 ENCOUNTER — Inpatient Hospital Stay: Payer: Commercial Managed Care - PPO

## 2018-04-26 ENCOUNTER — Telehealth: Payer: Self-pay | Admitting: Hematology and Oncology

## 2018-04-26 DIAGNOSIS — R232 Flushing: Secondary | ICD-10-CM

## 2018-04-26 DIAGNOSIS — Z5111 Encounter for antineoplastic chemotherapy: Secondary | ICD-10-CM | POA: Diagnosis present

## 2018-04-26 DIAGNOSIS — R530 Neoplastic (malignant) related fatigue: Secondary | ICD-10-CM

## 2018-04-26 DIAGNOSIS — D701 Agranulocytosis secondary to cancer chemotherapy: Secondary | ICD-10-CM | POA: Diagnosis not present

## 2018-04-26 DIAGNOSIS — C50412 Malignant neoplasm of upper-outer quadrant of left female breast: Secondary | ICD-10-CM

## 2018-04-26 DIAGNOSIS — Z170 Estrogen receptor positive status [ER+]: Secondary | ICD-10-CM

## 2018-04-26 DIAGNOSIS — N951 Menopausal and female climacteric states: Secondary | ICD-10-CM | POA: Diagnosis not present

## 2018-04-26 DIAGNOSIS — Z23 Encounter for immunization: Secondary | ICD-10-CM | POA: Diagnosis not present

## 2018-04-26 LAB — CMP (CANCER CENTER ONLY)
ALK PHOS: 68 U/L (ref 38–126)
ALT: 67 U/L — AB (ref 0–44)
AST: 32 U/L (ref 15–41)
Albumin: 4.5 g/dL (ref 3.5–5.0)
Anion gap: 8 (ref 5–15)
BUN: 10 mg/dL (ref 6–20)
CALCIUM: 9.7 mg/dL (ref 8.9–10.3)
CO2: 28 mmol/L (ref 22–32)
CREATININE: 0.71 mg/dL (ref 0.44–1.00)
Chloride: 106 mmol/L (ref 98–111)
Glucose, Bld: 89 mg/dL (ref 70–99)
Potassium: 3.9 mmol/L (ref 3.5–5.1)
Sodium: 142 mmol/L (ref 135–145)
Total Bilirubin: 0.4 mg/dL (ref 0.3–1.2)
Total Protein: 7.3 g/dL (ref 6.5–8.1)

## 2018-04-26 LAB — CBC WITH DIFFERENTIAL (CANCER CENTER ONLY)
Basophils Absolute: 0 10*3/uL (ref 0.0–0.1)
Basophils Relative: 1 %
Eosinophils Absolute: 0.1 10*3/uL (ref 0.0–0.5)
Eosinophils Relative: 4 %
HCT: 36.1 % (ref 34.8–46.6)
Hemoglobin: 12.4 g/dL (ref 11.6–15.9)
LYMPHS ABS: 0.8 10*3/uL — AB (ref 0.9–3.3)
LYMPHS PCT: 26 %
MCH: 31.3 pg (ref 25.1–34.0)
MCHC: 34.3 g/dL (ref 31.5–36.0)
MCV: 91.5 fL (ref 79.5–101.0)
MONO ABS: 0.1 10*3/uL (ref 0.1–0.9)
MONOS PCT: 4 %
Neutro Abs: 1.9 10*3/uL (ref 1.5–6.5)
Neutrophils Relative %: 65 %
Platelet Count: 305 10*3/uL (ref 145–400)
RBC: 3.95 MIL/uL (ref 3.70–5.45)
RDW: 14.7 % — ABNORMAL HIGH (ref 11.2–14.5)
WBC Count: 3 10*3/uL — ABNORMAL LOW (ref 3.9–10.3)

## 2018-04-26 MED ORDER — GABAPENTIN 300 MG PO CAPS: 300.0000 mg | ORAL_CAPSULE | Freq: Every day | ORAL | 3 refills | Status: DC

## 2018-04-26 NOTE — Assessment & Plan Note (Signed)
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  Recommendation: 1. Neoadjuvant chemotherapy with dose dense Adriamycin and Cytoxan followed by Taxol weekly x12, switched to Abraxane after first cycle because of Taxol reaction causing severe palpitations 2. Followed by surgery (which could be mastectomy) withAXLND versusTAD 3. Followed by radiation 4. Followed by antiestrogen therapy  Review of scans: No evidence of metastatic disease by CT scans and bone scan. Breast MRI 01/10/2018: Necrotic mass 2.4 x 3.2 x 4 cm, 4 abnormal lymph nodes in the axilla ------------------------------------------------------------------------------ Current treatment:Completed 4 cycles ofdose dense Adriamycin and   Cytoxan,after receiving Taxol, she had a reaction and we switched to Abraxane starting 04/13/2018.  Today is cycle 5  Zoladex injections monthly Monitoring closely for toxicities Return to clinic in 1 week for follow-up with me

## 2018-04-26 NOTE — Progress Notes (Signed)
Patient Care Team: [name] as PCP - General (Physician Assistant)  DIAGNOSIS:  Encounter Diagnoses  Name Primary?  . Malignant neoplasm of upper-outer quadrant of left breast in female, estrogen receptor positive ([place])   . 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    01/04/2018 Cancer Staging    Staging form: Breast, AJCC 8th Edition - Clinical: Stage IIB (cT2, cN1, cM0, G3, ER+, PR+, HER2: Equivocal) - Signed by Nicholas Lose, MD on 01/04/2018    01/16/2018 -  Neo-Adjuvant Chemotherapy    Dose dense Adriamycin and Cytoxan x4 followed by Taxol, switched to Abraxane with cycle 2 after Taxol reaction with palpitations weekly x12    01/19/2018 Genetic Testing    STK11 c.608C>T VUS identified on the Multicancer panel.  The Multi-Gene Panel offered by [facility] includes sequencing and/or deletion duplication testing of the following 83 genes: ALK, APC, ATM, AXIN2,BAP1,  BARD1, BLM, BMPR1A, BRCA1, BRCA2, BRIP1, CASR, CDC73, CDH1, CDK4, CDKN1B, CDKN1C, CDKN2A (p14ARF), CDKN2A (p16INK4a), CEBPA, CHEK2, CTNNA1, DICER1, DIS3L2, EGFR (c.2369C>T, p.Thr790Met variant only), EPCAM (Deletion/duplication testing only), FH, FLCN, GATA2, GPC3, GREM1 (Promoter region deletion/duplication testing only), HOXB13 (c.251G>A, p.Gly84Glu), HRAS, KIT, MAX, MEN1, MET, MITF (c.952G>A, p.Glu318Lys variant only), MLH1, MSH2, MSH3, MSH6, MUTYH, NBN, NF1, NF2, NTHL1, PALB2, PDGFRA, PHOX2B, PMS2, POLD1, POLE, POT1, PRKAR1A, PTCH1, PTEN, RAD50, RAD51C, RAD51D, RB1, RECQL4, RET, RUNX1, SDHAF2, SDHA (sequence changes only), SDHB,  SDHC, SDHD, SMAD4, SMARCA4, SMARCB1, SMARCE1, STK11, SUFU, TERT, TERT, TMEM127, TP53, TSC1, TSC2, VHL, WRN and WT1.  The report date is January 19, 2018.     CHIEF COMPLIANT: Cycle 5 Abraxane  INTERVAL HISTORY: Kristi [name] is a 42 year old with above-mentioned history of left breast cancer currently neoadjuvant chemotherapy and today is cycle 5 of Abraxane.  She is tolerating Abraxane extremely well.  She does not have any neuropathy.  Denies any nausea.  Her major complaint is fatigue which lasts for most of the week.  She is still able to work full-time.  REVIEW OF SYSTEMS:   Constitutional: Denies fevers, chills or abnormal weight loss Eyes: Denies blurriness of vision Ears, nose, mouth, throat, and face: Denies mucositis or sore throat Respiratory: Denies cough, dyspnea or wheezes Cardiovascular: Denies palpitation, chest discomfort Gastrointestinal:  Denies nausea, heartburn or change in bowel habits Skin: Denies abnormal skin rashes Lymphatics: Denies new lymphadenopathy or easy bruising Neurological:Denies numbness, tingling or new weaknesses Behavioral/Psych: Mood is stable, no new changes  Extremities: No lower extremity edema   All other systems were reviewed with the patient and are negative.  I have reviewed the past medical history, past surgical history, social history and family history with the patient and they are unchanged from previous note.  ALLERGIES:  is allergic to sulfa antibiotics.  MEDICATIONS:  Current Outpatient Medications  Medication Sig Dispense Refill  . baclofen (LIORESAL) 10 MG tablet Take 10 mg by mouth as needed.    Marland Kitchen buPROPion (WELLBUTRIN) 75 MG tablet Take 75 mg by mouth 2 (two) times daily.    Marland Kitchen EMGALITY 120 MG/ML SOAJ 120 mg every 30 (thirty) days.    Marland Kitchen gabapentin (  NEURONTIN) 300 MG capsule Take 1 capsule (300 mg total) by mouth at bedtime. 90 capsule 3  . Magnesium 500 MG CAPS Take 500 mg by mouth 2 (two) times daily.    . riboflavin  (VITAMIN B-2) 100 MG TABS tablet Take 400 mg by mouth 2 (two) times daily.    . rizatriptan (MAXALT) 10 MG tablet Take 10 mg by mouth as needed for migraine. May repeat in 2 hours if needed    . traMADol (ULTRAM) 50 MG tablet Take 1 tablet (50 mg total) by mouth every 6 (six) hours as needed. 10 tablet 0   No current facility-administered medications for this visit.    Facility-Administered Medications Ordered in Other Visits  Medication Dose Route Frequency Provider Last Rate Last Dose  . sodium chloride flush (NS) 0.9 % injection 10 mL  10 mL Intravenous PRN Nicholas Lose, MD   10 mL at 04/13/18 1529    PHYSICAL EXAMINATION: ECOG PERFORMANCE STATUS: 1 - Symptomatic but completely ambulatory  Vitals:   04/26/18 1130  BP: 129/87  Pulse: 84  Resp: 18  Temp: 98.3 F (36.8 C)  SpO2: 100%   Filed Weights   04/26/18 1130  Weight: 146 lb 1.6 oz (66.3 kg)    GENERAL:alert, no distress and comfortable SKIN: skin color, texture, turgor are normal, no rashes or significant lesions EYES: normal, Conjunctiva are pink and non-injected, sclera clear OROPHARYNX:no exudate, no erythema and lips, buccal mucosa, and tongue normal  NECK: supple, thyroid normal size, non-tender, without nodularity LYMPH:  no palpable lymphadenopathy in the cervical, axillary or inguinal LUNGS: clear to auscultation and percussion with normal breathing effort HEART: regular rate & rhythm and no murmurs and no lower extremity edema ABDOMEN:abdomen soft, non-tender and normal bowel sounds MUSCULOSKELETAL:no cyanosis of digits and no clubbing  NEURO: alert & oriented x 3 with fluent speech, no focal motor/sensory deficits EXTREMITIES: No lower extremity edema    LABORATORY DATA:  I have reviewed the data as listed CMP Latest Ref Rng & Units 04/20/2018 04/13/2018 04/05/2018  Glucose 70 - 99 mg/dL 83 87 122(H)  BUN 6 - 20 mg/dL 13 11 11   Creatinine 0.44 - 1.00 mg/dL 0.71 0.76 0.81  Sodium 135 - 145 mmol/L 142 142  140  Potassium 3.5 - 5.1 mmol/L 3.9 3.8 3.7  Chloride 98 - 111 mmol/L 105 108 104  CO2 22 - 32 mmol/L 27 24 25   Calcium 8.9 - 10.3 mg/dL 9.9 9.4 9.6  Total Protein 6.5 - 8.1 g/dL 7.2 7.0 7.3  Total Bilirubin 0.3 - 1.2 mg/dL 0.4 0.3 0.5  Alkaline Phos 38 - 126 U/L 65 63 63  AST 15 - 41 U/L 39 29 41  ALT 0 - 44 U/L 88(H) 65(H) 110(H)    Lab Results  Component Value Date   WBC 3.0 (L) 04/26/2018   HGB 12.4 04/26/2018   HCT 36.1 04/26/2018   MCV 91.5 04/26/2018   PLT 305 04/26/2018   NEUTROABS 1.9 04/26/2018    ASSESSMENT & PLAN:  Malignant neoplasm of upper-outer quadrant of left breast in female, estrogen receptor positive ([place]) /05/2018: Palpable lump left breastsince December 2018, mammogram: Left breast UOQ 2.3 cm mass, axilla has several enlarged lymph nodes; 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  Recommendation: 1. Neoadjuvant chemotherapy with dose dense Adriamycin and Cytoxan followed by Taxol weekly x12, switched to Abraxane after first cycle because  of Taxol reaction causing severe palpitations 2. Followed by surgery (which could be mastectomy) withAXLND versusTAD 3. Followed by radiation 4. Followed by antiestrogen therapy  Review of scans: No evidence of metastatic disease by CT scans and bone scan. Breast MRI 01/10/2018: Necrotic mass 2.4 x 3.2 x 4 cm, 4 abnormal lymph nodes in the axilla ------------------------------------------------------------------------------ Current treatment:Completed 4 cycles ofdose dense Adriamycin and   Cytoxan,after receiving Taxol, she had a reaction and we switched to Abraxane starting 04/13/2018.  Today is cycle 5 Abraxane toxicities: Severe fatigue Denies neuropathy  Zoladex injections monthly Monitoring closely for toxicities Return to clinic in 1 week for follow-up with me    No orders of the defined types were  placed in this encounter.  The patient has a good understanding of the overall plan. she agrees with it. she will call with any problems that may develop before the next visit here.   Harriette Ohara, MD 04/26/18

## 2018-04-26 NOTE — Telephone Encounter (Signed)
Per 10/3 los, no orders or referrals.

## 2018-04-27 ENCOUNTER — Other Ambulatory Visit: Payer: Commercial Managed Care - PPO

## 2018-04-27 ENCOUNTER — Encounter: Payer: Self-pay | Admitting: *Deleted

## 2018-04-27 ENCOUNTER — Other Ambulatory Visit: Payer: Self-pay | Admitting: General Surgery

## 2018-04-27 ENCOUNTER — Ambulatory Visit: Payer: Commercial Managed Care - PPO | Admitting: Hematology and Oncology

## 2018-04-27 ENCOUNTER — Inpatient Hospital Stay: Payer: Commercial Managed Care - PPO

## 2018-04-27 DIAGNOSIS — Z170 Estrogen receptor positive status [ER+]: Secondary | ICD-10-CM

## 2018-04-27 DIAGNOSIS — C50412 Malignant neoplasm of upper-outer quadrant of left female breast: Secondary | ICD-10-CM

## 2018-04-27 DIAGNOSIS — Z5111 Encounter for antineoplastic chemotherapy: Secondary | ICD-10-CM | POA: Diagnosis not present

## 2018-04-27 DIAGNOSIS — Z23 Encounter for immunization: Secondary | ICD-10-CM

## 2018-04-27 MED ORDER — INFLUENZA VAC SPLIT QUAD 0.5 ML IM SUSY
PREFILLED_SYRINGE | INTRAMUSCULAR | Status: AC
  Filled 2018-04-27: qty 0.5

## 2018-04-27 MED ORDER — SODIUM CHLORIDE 0.9% FLUSH
10.0000 mL | INTRAVENOUS | Status: DC | PRN
  Administered 2018-04-27: 10 mL
  Filled 2018-04-27: qty 10

## 2018-04-27 MED ORDER — PROCHLORPERAZINE MALEATE 10 MG PO TABS
10.0000 mg | ORAL_TABLET | Freq: Once | ORAL | Status: AC
  Administered 2018-04-27: 10 mg via ORAL

## 2018-04-27 MED ORDER — HEPARIN SOD (PORK) LOCK FLUSH 100 UNIT/ML IV SOLN
500.0000 [IU] | Freq: Once | INTRAVENOUS | Status: AC | PRN
  Administered 2018-04-27: 500 [IU]
  Filled 2018-04-27: qty 5

## 2018-04-27 MED ORDER — SODIUM CHLORIDE 0.9 % IV SOLN
Freq: Once | INTRAVENOUS | Status: AC
  Administered 2018-04-27: 13:00:00 via INTRAVENOUS
  Filled 2018-04-27: qty 250

## 2018-04-27 MED ORDER — INFLUENZA VAC SPLIT QUAD 0.5 ML IM SUSY
0.5000 mL | PREFILLED_SYRINGE | Freq: Once | INTRAMUSCULAR | Status: AC
  Administered 2018-04-27: 0.5 mL via INTRAMUSCULAR

## 2018-04-27 MED ORDER — PACLITAXEL PROTEIN-BOUND CHEMO INJECTION 100 MG
80.0000 mg/m2 | Freq: Once | INTRAVENOUS | Status: AC
  Administered 2018-04-27: 150 mg via INTRAVENOUS
  Filled 2018-04-27: qty 30

## 2018-04-27 MED ORDER — PROCHLORPERAZINE MALEATE 10 MG PO TABS
ORAL_TABLET | ORAL | Status: AC
  Filled 2018-04-27: qty 1

## 2018-04-27 NOTE — Patient Instructions (Signed)
[facility] Discharge Instructions for Patients Receiving Chemotherapy  Today you received the following chemotherapy agents:  Abraxane.  To help prevent nausea and vomiting after your treatment, we encourage you to take your nausea medication as directed.   If you develop nausea and vomiting that is not controlled by your nausea medication, call the clinic.   BELOW ARE SYMPTOMS THAT SHOULD BE REPORTED IMMEDIATELY:  *FEVER GREATER THAN 100.5 F  *CHILLS WITH OR WITHOUT FEVER  NAUSEA AND VOMITING THAT IS NOT CONTROLLED WITH YOUR NAUSEA MEDICATION  *UNUSUAL SHORTNESS OF BREATH  *UNUSUAL BRUISING OR BLEEDING  TENDERNESS IN MOUTH AND THROAT WITH OR WITHOUT PRESENCE OF ULCERS  *URINARY PROBLEMS  *BOWEL PROBLEMS  UNUSUAL RASH Items with * indicate a potential emergency and should be followed up as soon as possible.  Feel free to call the clinic should you have any questions or concerns. The clinic phone number is [phone].  Please show the CHEMO ALERT CARD at check-in to the Emergency Department and triage nurse.   

## 2018-04-30 ENCOUNTER — Other Ambulatory Visit: Payer: Self-pay | Admitting: General Surgery

## 2018-04-30 DIAGNOSIS — Z170 Estrogen receptor positive status [ER+]: Secondary | ICD-10-CM

## 2018-04-30 DIAGNOSIS — C50412 Malignant neoplasm of upper-outer quadrant of left female breast: Secondary | ICD-10-CM

## 2018-05-03 NOTE — Progress Notes (Signed)
Pharmacy inquired about if provider needs to see patient prior to infusion on 05/04/2018.  Nurse spoke with Dr. Lindi Adie - Faythe Ghee to see patient every other treatment.  No office visit needed for 05/04/2018.

## 2018-05-04 ENCOUNTER — Inpatient Hospital Stay: Payer: Commercial Managed Care - PPO

## 2018-05-04 VITALS — BP 133/84 | HR 94 | Temp 98.2°F | Resp 18

## 2018-05-04 DIAGNOSIS — Z170 Estrogen receptor positive status [ER+]: Secondary | ICD-10-CM

## 2018-05-04 DIAGNOSIS — C50412 Malignant neoplasm of upper-outer quadrant of left female breast: Secondary | ICD-10-CM

## 2018-05-04 DIAGNOSIS — Z95828 Presence of other vascular implants and grafts: Secondary | ICD-10-CM

## 2018-05-04 DIAGNOSIS — Z5111 Encounter for antineoplastic chemotherapy: Secondary | ICD-10-CM | POA: Diagnosis not present

## 2018-05-04 LAB — CBC WITH DIFFERENTIAL (CANCER CENTER ONLY)
ABS IMMATURE GRANULOCYTES: 0.01 10*3/uL (ref 0.00–0.07)
BASOS ABS: 0 10*3/uL (ref 0.0–0.1)
BASOS PCT: 1 %
Eosinophils Absolute: 0 10*3/uL (ref 0.0–0.5)
Eosinophils Relative: 2 %
HCT: 35.2 % — ABNORMAL LOW (ref 36.0–46.0)
Hemoglobin: 12 g/dL (ref 12.0–15.0)
IMMATURE GRANULOCYTES: 0 %
Lymphocytes Relative: 42 %
Lymphs Abs: 1.2 10*3/uL (ref 0.7–4.0)
MCH: 31.3 pg (ref 26.0–34.0)
MCHC: 34.1 g/dL (ref 30.0–36.0)
MCV: 91.7 fL (ref 80.0–100.0)
Monocytes Absolute: 0.2 10*3/uL (ref 0.1–1.0)
Monocytes Relative: 6 %
NEUTROS ABS: 1.4 10*3/uL — AB (ref 1.7–7.7)
NEUTROS PCT: 49 %
PLATELETS: 290 10*3/uL (ref 150–400)
RBC: 3.84 MIL/uL — AB (ref 3.87–5.11)
RDW: 13 % (ref 11.5–15.5)
WBC: 2.7 10*3/uL — AB (ref 4.0–10.5)
nRBC: 0 % (ref 0.0–0.2)

## 2018-05-04 LAB — CMP (CANCER CENTER ONLY)
ALBUMIN: 4.4 g/dL (ref 3.5–5.0)
ALT: 79 U/L — ABNORMAL HIGH (ref 0–44)
ANION GAP: 9 (ref 5–15)
AST: 41 U/L (ref 15–41)
Alkaline Phosphatase: 67 U/L (ref 38–126)
BILIRUBIN TOTAL: 0.4 mg/dL (ref 0.3–1.2)
BUN: 14 mg/dL (ref 6–20)
CO2: 26 mmol/L (ref 22–32)
Calcium: 9.9 mg/dL (ref 8.9–10.3)
Chloride: 105 mmol/L (ref 98–111)
Creatinine: 0.7 mg/dL (ref 0.44–1.00)
GFR, Est AFR Am: >60 mL/min (ref >60)
Glucose, Bld: 90 mg/dL (ref 70–99)
POTASSIUM: 3.7 mmol/L (ref 3.5–5.1)
Sodium: 140 mmol/L (ref 135–145)
TOTAL PROTEIN: 7.2 g/dL (ref 6.5–8.1)

## 2018-05-04 MED ORDER — SODIUM CHLORIDE 0.9% FLUSH
10.0000 mL | INTRAVENOUS | Status: DC | PRN
  Administered 2018-05-04: 10 mL
  Filled 2018-05-04: qty 10

## 2018-05-04 MED ORDER — PROCHLORPERAZINE MALEATE 10 MG PO TABS
10.0000 mg | ORAL_TABLET | Freq: Once | ORAL | Status: AC
  Administered 2018-05-04: 10 mg via ORAL

## 2018-05-04 MED ORDER — HEPARIN SOD (PORK) LOCK FLUSH 100 UNIT/ML IV SOLN
500.0000 [IU] | Freq: Once | INTRAVENOUS | Status: AC | PRN
  Administered 2018-05-04: 500 [IU]
  Filled 2018-05-04: qty 5

## 2018-05-04 MED ORDER — SODIUM CHLORIDE 0.9 % IV SOLN
Freq: Once | INTRAVENOUS | Status: AC
  Administered 2018-05-04: 12:00:00 via INTRAVENOUS
  Filled 2018-05-04: qty 250

## 2018-05-04 MED ORDER — PACLITAXEL PROTEIN-BOUND CHEMO INJECTION 100 MG
80.0000 mg/m2 | Freq: Once | INTRAVENOUS | Status: AC
  Administered 2018-05-04: 150 mg via INTRAVENOUS
  Filled 2018-05-04: qty 30

## 2018-05-04 MED ORDER — SODIUM CHLORIDE 0.9% FLUSH
10.0000 mL | Freq: Once | INTRAVENOUS | Status: AC
  Administered 2018-05-04: 10 mL
  Filled 2018-05-04: qty 10

## 2018-05-04 MED ORDER — PROCHLORPERAZINE MALEATE 10 MG PO TABS
ORAL_TABLET | ORAL | Status: AC
  Filled 2018-05-04: qty 1

## 2018-05-04 NOTE — Progress Notes (Signed)
Okay to treat with ANC of 1.4 today per Dr. Lindi Adie.

## 2018-05-04 NOTE — Patient Instructions (Signed)
[facility] Discharge Instructions for Patients Receiving Chemotherapy  Today you received the following chemotherapy agents:  Abraxane.  To help prevent nausea and vomiting after your treatment, we encourage you to take your nausea medication as directed.   If you develop nausea and vomiting that is not controlled by your nausea medication, call the clinic.   BELOW ARE SYMPTOMS THAT SHOULD BE REPORTED IMMEDIATELY:  *FEVER GREATER THAN 100.5 F  *CHILLS WITH OR WITHOUT FEVER  NAUSEA AND VOMITING THAT IS NOT CONTROLLED WITH YOUR NAUSEA MEDICATION  *UNUSUAL SHORTNESS OF BREATH  *UNUSUAL BRUISING OR BLEEDING  TENDERNESS IN MOUTH AND THROAT WITH OR WITHOUT PRESENCE OF ULCERS  *URINARY PROBLEMS  *BOWEL PROBLEMS  UNUSUAL RASH Items with * indicate a potential emergency and should be followed up as soon as possible.  Feel free to call the clinic should you have any questions or concerns. The clinic phone number is [phone].  Please show the CHEMO ALERT CARD at check-in to the Emergency Department and triage nurse.   

## 2018-05-07 ENCOUNTER — Ambulatory Visit (HOSPITAL_COMMUNITY)
Admission: RE | Admit: 2018-05-07 | Discharge: 2018-05-07 | Disposition: A | Discharge status: Home / Self Care | Payer: Commercial Managed Care - PPO | Source: Ambulatory Visit | Attending: Cardiology | Admitting: Cardiology

## 2018-05-07 VITALS — BP 110/72 | HR 91 | Wt 147.0 lb

## 2018-05-07 DIAGNOSIS — Z79899 Other long term (current) drug therapy: Secondary | ICD-10-CM | POA: Insufficient documentation

## 2018-05-07 DIAGNOSIS — R0600 Dyspnea, unspecified: Secondary | ICD-10-CM

## 2018-05-07 DIAGNOSIS — R0609 Other forms of dyspnea: Secondary | ICD-10-CM | POA: Diagnosis not present

## 2018-05-07 DIAGNOSIS — C50412 Malignant neoplasm of upper-outer quadrant of left female breast: Secondary | ICD-10-CM

## 2018-05-07 DIAGNOSIS — Z170 Estrogen receptor positive status [ER+]: Secondary | ICD-10-CM | POA: Diagnosis not present

## 2018-05-07 DIAGNOSIS — R002 Palpitations: Secondary | ICD-10-CM | POA: Diagnosis not present

## 2018-05-07 DIAGNOSIS — C50912 Malignant neoplasm of unspecified site of left female breast: Secondary | ICD-10-CM | POA: Diagnosis present

## 2018-05-07 DIAGNOSIS — R0781 Pleurodynia: Secondary | ICD-10-CM

## 2018-05-07 NOTE — Patient Instructions (Signed)
Follow up in March 2020 with echocardiogram and appointment

## 2018-05-07 NOTE — Progress Notes (Signed)
Oncology: Dr. Lindi Adie  42 yo with history of breast cancer presents was referred by Dr. Lindi Adie for evaluation of dyspnea/chest pain/tachycardia.  She was diagnosed with left breast cancer in 6/19, ER+/PR+/HER2 equivocal.  In 6/19, she started Adriamycin/Cytoxan and has completed 4 cycles.  She is now getting abraxane.  She will need surgery eventually.   Prior to last appointment, she had been having pleuritic chest pain, tachycardia, and dyspnea. CTA chest showed no PE or PNA.  Zio patch was done, this showed average HR 94 with no significant arrhythmias.     She has been doing much better recently.  She no longer feels palpitations.  No dyspnea with her normal activities.  No chest pain.  Mild fatigue.    PMH: 1. Endometriosis.  2. Breast cancer: She was diagnosed with left breast cancer in 6/19, ER+/PR+/HER2 equivocal.  In 6/19, she started Adriamycin/Cytoxan and has completed 4 cycles.  She is now getting paclitaxel for 12 cycles.  She will need surgery eventually.  - Echo (6/19): EF 60-65%.  - Echo (9/19): EF 31-54%, normal diastolic function, normal RV (no change from 6/19).  3. Palpitations: Zio patch (9/19) with average HR 94, no significant arrhythmias.   FH: Grandfather with DVT.  No premature CAD or CHF.   Social History   Socioeconomic History  . Marital status: Single    Spouse name: Not on file  . Number of children: Not on file  . Years of education: Not on file  . Highest education level: Not on file  Occupational History  . Not on file  Social Needs  . Financial resource strain: Not on file  . Food insecurity:    Worry: Not on file    Inability: Not on file  . Transportation needs:    Medical: Not on file    Non-medical: Not on file  Tobacco Use  . Smoking status: Never Smoker  . Smokeless tobacco: Never Used  Substance and Sexual Activity  . Alcohol use: Yes    Comment: occasionally  . Drug use: Never  . Sexual activity: Not on file  Lifestyle  . Physical  activity:    Days per week: Not on file    Minutes per session: Not on file  . Stress: Not on file  Relationships  . Social connections:    Talks on phone: Not on file    Gets together: Not on file    Attends religious service: Not on file    Active member of club or organization: Not on file    Attends meetings of clubs or organizations: Not on file    Relationship status: Not on file  . Intimate partner violence:    Fear of current or ex partner: Not on file    Emotionally abused: Not on file    Physically abused: Not on file    Forced sexual activity: Not on file  Other Topics Concern  . Not on file  Social History Narrative  . Not on file   ROS: All systems reviewed and negative except as per HPI.   Current Outpatient Medications  Medication Sig Dispense Refill  . baclofen (LIORESAL) 10 MG tablet Take 10 mg by mouth as needed.    Marland Kitchen buPROPion (WELLBUTRIN) 75 MG tablet Take 75 mg by mouth 2 (two) times daily.    Marland Kitchen EMGALITY 120 MG/ML SOAJ 120 mg every 30 (thirty) days.    Marland Kitchen gabapentin (NEURONTIN) 300 MG capsule Take 1 capsule (300 mg total) by mouth  at bedtime. 90 capsule 3  . Magnesium 500 MG CAPS Take 500 mg by mouth 2 (two) times daily.    . riboflavin (VITAMIN B-2) 100 MG TABS tablet Take 400 mg by mouth 2 (two) times daily.    . rizatriptan (MAXALT) 10 MG tablet Take 10 mg by mouth as needed for migraine. May repeat in 2 hours if needed    . traMADol (ULTRAM) 50 MG tablet Take 1 tablet (50 mg total) by mouth every 6 (six) hours as needed. 10 tablet 0   No current facility-administered medications for this encounter.    Facility-Administered Medications Ordered in Other Encounters  Medication Dose Route Frequency Provider Last Rate Last Dose  . sodium chloride flush (NS) 0.9 % injection 10 mL  10 mL Intravenous PRN Nicholas Lose, MD   10 mL at 04/13/18 1529   BP 110/72   Pulse 91   Wt 66.7 kg (147 lb)   SpO2 94%   BMI 24.46 kg/m  General: NAD Neck: No JVD, no  thyromegaly or thyroid nodule.  Lungs: Clear to auscultation bilaterally with normal respiratory effort. CV: Nondisplaced PMI.  Heart regular S1/S2, no S3/S4, no murmur.  No peripheral edema.  No carotid bruit.  Normal pedal pulses.  Abdomen: Soft, nontender, no hepatosplenomegaly, no distention.  Skin: Intact without lesions or rashes.  Neurologic: Alert and oriented x 3.  Psych: Normal affect. Extremities: No clubbing or cyanosis.  HEENT: Normal.   Assessment/Plan: 1. Dypsnea/chest pain: This is resolved.  No PE or PNA on CTA in 9/19.  Echo was unremarkable.  2. Palpitations: Resolved.  Zio patch unremarkable.  Her resting HR is generally mildly elevated in 90s and rises > 100 with stress.  She may have mild POTS.   3. Breast cancer: She has completed Adriamycin-based treatment.  Echo in 9/19 was reviewed and appeared normal, no change from pre-op echo.  - She will need a repeat echo in 3/20 to assess for any late effects of Adriamycin.   Followup 3/20 with echo.   Loralie Champagne 05/07/2018

## 2018-05-08 ENCOUNTER — Encounter (HOSPITAL_COMMUNITY): Payer: Self-pay | Admitting: *Deleted

## 2018-05-11 ENCOUNTER — Inpatient Hospital Stay: Payer: Commercial Managed Care - PPO

## 2018-05-11 ENCOUNTER — Inpatient Hospital Stay (HOSPITAL_BASED_OUTPATIENT_CLINIC_OR_DEPARTMENT_OTHER): Payer: Commercial Managed Care - PPO | Admitting: Hematology and Oncology

## 2018-05-11 ENCOUNTER — Telehealth: Payer: Self-pay

## 2018-05-11 DIAGNOSIS — C50412 Malignant neoplasm of upper-outer quadrant of left female breast: Secondary | ICD-10-CM

## 2018-05-11 DIAGNOSIS — D701 Agranulocytosis secondary to cancer chemotherapy: Secondary | ICD-10-CM | POA: Diagnosis not present

## 2018-05-11 DIAGNOSIS — R530 Neoplastic (malignant) related fatigue: Secondary | ICD-10-CM

## 2018-05-11 DIAGNOSIS — Z170 Estrogen receptor positive status [ER+]: Secondary | ICD-10-CM

## 2018-05-11 DIAGNOSIS — T451X5A Adverse effect of antineoplastic and immunosuppressive drugs, initial encounter: Secondary | ICD-10-CM

## 2018-05-11 DIAGNOSIS — Z95828 Presence of other vascular implants and grafts: Secondary | ICD-10-CM

## 2018-05-11 DIAGNOSIS — C773 Secondary and unspecified malignant neoplasm of axilla and upper limb lymph nodes: Secondary | ICD-10-CM | POA: Diagnosis not present

## 2018-05-11 DIAGNOSIS — Z5111 Encounter for antineoplastic chemotherapy: Secondary | ICD-10-CM | POA: Diagnosis not present

## 2018-05-11 LAB — CBC WITH DIFFERENTIAL (CANCER CENTER ONLY)
Abs Immature Granulocytes: 0.01 10*3/uL (ref 0.00–0.07)
Basophils Absolute: 0 10*3/uL (ref 0.0–0.1)
Basophils Relative: 1 %
Eosinophils Absolute: 0 10*3/uL (ref 0.0–0.5)
Eosinophils Relative: 1 %
HEMATOCRIT: 39 % (ref 36.0–46.0)
Hemoglobin: 12.9 g/dL (ref 12.0–15.0)
Immature Granulocytes: 0 %
LYMPHS ABS: 1 10*3/uL (ref 0.7–4.0)
LYMPHS PCT: 42 %
MCH: 30.4 pg (ref 26.0–34.0)
MCHC: 33.1 g/dL (ref 30.0–36.0)
MCV: 91.8 fL (ref 80.0–100.0)
MONOS PCT: 7 %
Monocytes Absolute: 0.2 10*3/uL (ref 0.1–1.0)
NEUTROS ABS: 1.1 10*3/uL — AB (ref 1.7–7.7)
Neutrophils Relative %: 49 %
Platelet Count: 334 10*3/uL (ref 150–400)
RBC: 4.25 MIL/uL (ref 3.87–5.11)
RDW: 13.1 % (ref 11.5–15.5)
WBC Count: 2.4 10*3/uL — ABNORMAL LOW (ref 4.0–10.5)
nRBC: 0 % (ref 0.0–0.2)

## 2018-05-11 LAB — CMP (CANCER CENTER ONLY)
ALBUMIN: 4.7 g/dL (ref 3.5–5.0)
ALT: 84 U/L — ABNORMAL HIGH (ref 0–44)
ANION GAP: 10 (ref 5–15)
AST: 43 U/L — AB (ref 15–41)
Alkaline Phosphatase: 74 U/L (ref 38–126)
BILIRUBIN TOTAL: 0.4 mg/dL (ref 0.3–1.2)
BUN: 7 mg/dL (ref 6–20)
CO2: 25 mmol/L (ref 22–32)
Calcium: 10.3 mg/dL (ref 8.9–10.3)
Chloride: 105 mmol/L (ref 98–111)
Creatinine: 0.74 mg/dL (ref 0.44–1.00)
GFR, Est AFR Am: >60 mL/min (ref >60)
GFR, Estimated: >60 mL/min (ref >60)
GLUCOSE: 84 mg/dL (ref 70–99)
POTASSIUM: 4 mmol/L (ref 3.5–5.1)
SODIUM: 140 mmol/L (ref 135–145)
Total Protein: 7.6 g/dL (ref 6.5–8.1)

## 2018-05-11 MED ORDER — LORAZEPAM 0.5 MG PO TABS: 0.5000 mg | ORAL_TABLET | Freq: Three times a day (TID) | ORAL | 1 refills | Status: DC | PRN

## 2018-05-11 MED ORDER — PROCHLORPERAZINE MALEATE 10 MG PO TABS
10.0000 mg | ORAL_TABLET | Freq: Once | ORAL | Status: AC
  Administered 2018-05-11: 10 mg via ORAL

## 2018-05-11 MED ORDER — SODIUM CHLORIDE 0.9% FLUSH
10.0000 mL | Freq: Once | INTRAVENOUS | Status: AC
  Administered 2018-05-11: 10 mL
  Filled 2018-05-11: qty 10

## 2018-05-11 MED ORDER — SODIUM CHLORIDE 0.9 % IV SOLN
Freq: Once | INTRAVENOUS | Status: AC
  Administered 2018-05-11: 13:00:00 via INTRAVENOUS
  Filled 2018-05-11: qty 250

## 2018-05-11 MED ORDER — SODIUM CHLORIDE 0.9% FLUSH
10.0000 mL | INTRAVENOUS | Status: DC | PRN
  Administered 2018-05-11: 10 mL
  Filled 2018-05-11: qty 10

## 2018-05-11 MED ORDER — HEPARIN SOD (PORK) LOCK FLUSH 100 UNIT/ML IV SOLN
500.0000 [IU] | Freq: Once | INTRAVENOUS | Status: AC | PRN
  Administered 2018-05-11: 500 [IU]
  Filled 2018-05-11: qty 5

## 2018-05-11 MED ORDER — GOSERELIN ACETATE 3.6 MG ~~LOC~~ IMPL
DRUG_IMPLANT | SUBCUTANEOUS | Status: AC
  Filled 2018-05-11: qty 3.6

## 2018-05-11 MED ORDER — PACLITAXEL PROTEIN-BOUND CHEMO INJECTION 100 MG
80.0000 mg/m2 | Freq: Once | INTRAVENOUS | Status: AC
  Administered 2018-05-11: 150 mg via INTRAVENOUS
  Filled 2018-05-11: qty 30

## 2018-05-11 MED ORDER — GOSERELIN ACETATE 3.6 MG ~~LOC~~ IMPL
3.6000 mg | DRUG_IMPLANT | Freq: Once | SUBCUTANEOUS | Status: AC
  Administered 2018-05-11: 3.6 mg via SUBCUTANEOUS

## 2018-05-11 MED ORDER — PROCHLORPERAZINE MALEATE 10 MG PO TABS
ORAL_TABLET | ORAL | Status: AC
  Filled 2018-05-11: qty 1

## 2018-05-11 NOTE — Progress Notes (Signed)
Ok to treat with ANC 1.1 per Dr Gudena.   

## 2018-05-11 NOTE — Assessment & Plan Note (Signed)
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  Recommendation: 1. Neoadjuvant chemotherapy with dose dense Adriamycin and Cytoxan followed by Taxol weekly x12, switched to Abraxane after first cycle because of Taxol reaction causing severe palpitations 2. Followed by surgery (which could be mastectomy) withAXLND versusTAD 3. Followed by radiation 4. Followed by antiestrogen therapy  Review of scans: No evidence of metastatic disease by CT scans and bone scan. Breast MRI 01/10/2018: Necrotic mass 2.4 x 3.2 x 4 cm, 4 abnormal lymph nodes in the axilla ------------------------------------------------------------------------------ Current treatment:Completed 4 cycles ofdose dense Adriamycin and Cytoxan,afterreceivingTaxol,she had a reaction and weswitched to Abraxane starting 04/13/2018.  Today is cycle 8 Abraxane toxicities: Severe fatigue Denies neuropathy  Zoladex injections monthly Monitoring closely for toxicities Return to clinic in 2 weeks for follow-up with me

## 2018-05-11 NOTE — Telephone Encounter (Signed)
ALT 84. Per Dr. Lindi Adie it is ok to treat today with Abraxane.

## 2018-05-11 NOTE — Progress Notes (Signed)
Patient Care Team: [name] as PCP - General (Physician Assistant)  DIAGNOSIS:  Encounter Diagnosis  Name Primary?  . Malignant neoplasm of upper-outer quadrant of left breast in female, estrogen receptor positive ([place])     SUMMARY OF ONCOLOGIC HISTORY:   Malignant neoplasm of upper-outer quadrant of left breast in female, estrogen receptor positive ([place])   01/02/2018 Initial Diagnosis    Palpable lump left breast, mammogram: Left breast UOQ 2.3 cm mass, axilla has several enlarged lymph nodes; 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    01/04/2018 Cancer Staging    Staging form: Breast, AJCC 8th Edition - Clinical: Stage IIB (cT2, cN1, cM0, G3, ER+, PR+, HER2: Equivocal) - Signed by Nicholas Lose, MD on 01/04/2018    01/16/2018 -  Neo-Adjuvant Chemotherapy    Dose dense Adriamycin and Cytoxan x4 followed by Taxol, switched to Abraxane with cycle 2 after Taxol reaction with palpitations weekly x12    01/19/2018 Genetic Testing    STK11 c.608C>T VUS identified on the Multicancer panel.  The Multi-Gene Panel offered by [facility] includes sequencing and/or deletion duplication testing of the following 83 genes: ALK, APC, ATM, AXIN2,BAP1,  BARD1, BLM, BMPR1A, BRCA1, BRCA2, BRIP1, CASR, CDC73, CDH1, CDK4, CDKN1B, CDKN1C, CDKN2A (p14ARF), CDKN2A (p16INK4a), CEBPA, CHEK2, CTNNA1, DICER1, DIS3L2, EGFR (c.2369C>T, p.Thr790Met variant only), EPCAM (Deletion/duplication testing only), FH, FLCN, GATA2, GPC3, GREM1 (Promoter region deletion/duplication testing only), HOXB13 (c.251G>A, p.Gly84Glu), HRAS, KIT, MAX, MEN1, MET, MITF (c.952G>A, p.Glu318Lys variant only), MLH1, MSH2, MSH3, MSH6, MUTYH, NBN, NF1, NF2, NTHL1, PALB2, PDGFRA, PHOX2B, PMS2, POLD1, POLE, POT1, PRKAR1A, PTCH1, PTEN, RAD50, RAD51C, RAD51D, RB1, RECQL4, RET, RUNX1, SDHAF2, SDHA (sequence changes only), SDHB, SDHC, SDHD,  SMAD4, SMARCA4, SMARCB1, SMARCE1, STK11, SUFU, TERT, TERT, TMEM127, TP53, TSC1, TSC2, VHL, WRN and WT1.  The report date is January 19, 2018.     CHIEF COMPLIANT: Cycle 7 Abraxane  INTERVAL HISTORY: Kristi [name] is a 42 year old with above-mentioned history of left breast cancer currently neoadjuvant chemotherapy today cycle 7 of Abraxane.  She is tolerating Abraxane extremely well.  Denies any neuropathy.  Her major issue is a decrease in neutrophil counts.  She has had  progressively decline in the blood counts.  REVIEW OF SYSTEMS:   Constitutional: Denies fevers, chills or abnormal weight loss Eyes: Denies blurriness of vision Ears, nose, mouth, throat, and face: Denies mucositis or sore throat Respiratory: Denies cough, dyspnea or wheezes Cardiovascular: Denies palpitation, chest discomfort Gastrointestinal:  Denies nausea, heartburn or change in bowel habits Skin: Denies abnormal skin rashes Lymphatics: Denies new lymphadenopathy or easy bruising Neurological:Denies numbness, tingling or new weaknesses Behavioral/Psych: Mood is stable, no new changes  Extremities: No lower extremity edema   All other systems were reviewed with the patient and are negative.  I have reviewed the past medical history, past surgical history, social history and family history with the patient and they are unchanged from previous note.  ALLERGIES:  is allergic to sulfa antibiotics.  MEDICATIONS:  Current Outpatient Medications  Medication Sig Dispense Refill  . baclofen (LIORESAL) 10 MG tablet Take 10 mg by mouth as needed.    Marland Kitchen buPROPion (WELLBUTRIN) 75 MG tablet Take 75 mg by mouth 2 (two) times daily.    Marland Kitchen EMGALITY 120 MG/ML SOAJ 120 mg every 30 (thirty) days.    Marland Kitchen gabapentin (NEURONTIN) 300 MG capsule Take 1 capsule (300 mg total) by mouth at bedtime.  90 capsule 3  . LORazepam (ATIVAN) 0.5 MG tablet Take 1 tablet (0.5 mg total) by mouth every 8 (eight) hours as needed for anxiety. 30 tablet 1    . Magnesium 500 MG CAPS Take 500 mg by mouth 2 (two) times daily.    . riboflavin (VITAMIN B-2) 100 MG TABS tablet Take 400 mg by mouth 2 (two) times daily.    . rizatriptan (MAXALT) 10 MG tablet Take 10 mg by mouth as needed for migraine. May repeat in 2 hours if needed    . traMADol (ULTRAM) 50 MG tablet Take 1 tablet (50 mg total) by mouth every 6 (six) hours as needed. 10 tablet 0   No current facility-administered medications for this visit.    Facility-Administered Medications Ordered in Other Visits  Medication Dose Route Frequency Provider Last Rate Last Dose  . sodium chloride flush (NS) 0.9 % injection 10 mL  10 mL Intravenous PRN Nicholas Lose, MD   10 mL at 04/13/18 1529    PHYSICAL EXAMINATION: ECOG PERFORMANCE STATUS: 1 - Symptomatic but completely ambulatory  Vitals:   05/11/18 1145  BP: (!) 134/103  Pulse: 92  Resp: 18  Temp: 98.7 F (37.1 C)  SpO2: 100%   Filed Weights   05/11/18 1145  Weight: 144 lb 14.4 oz (65.7 kg)    GENERAL:alert, no distress and comfortable SKIN: skin color, texture, turgor are normal, no rashes or significant lesions EYES: normal, Conjunctiva are pink and non-injected, sclera clear OROPHARYNX:no exudate, no erythema and lips, buccal mucosa, and tongue normal  NECK: supple, thyroid normal size, non-tender, without nodularity LYMPH:  no palpable lymphadenopathy in the cervical, axillary or inguinal LUNGS: clear to auscultation and percussion with normal breathing effort HEART: regular rate & rhythm and no murmurs and no lower extremity edema ABDOMEN:abdomen soft, non-tender and normal bowel sounds MUSCULOSKELETAL:no cyanosis of digits and no clubbing  NEURO: alert & oriented x 3 with fluent speech, no focal motor/sensory deficits EXTREMITIES: No lower extremity edema   LABORATORY DATA:  I have reviewed the data as listed CMP Latest Ref Rng & Units 05/11/2018 05/04/2018 04/26/2018  Glucose 70 - 99 mg/dL 84 90 89  BUN 6 - 20 mg/dL _0 Creatinine 0.44 - 1.00 mg/dL 0.74 0.70 0.71  Sodium 135 - 145 mmol/L 140 140 142  Potassium 3.5 - 5.1 mmol/L 4.0 3.7 3.9  Chloride 98 - 111 mmol/L 105 105 106  CO2 22 - 32 mmol/L _1 Calcium 8.9 - 10.3 mg/dL 10.3 9.9 9.7  Total Protein 6.5 - 8.1 g/dL 7.6 7.2 7.3  Total Bilirubin 0.3 - 1.2 mg/dL 0.4 0.4 0.4  Alkaline Phos 38 - 126 U/L 74 67 68  AST 15 - 41 U/L 43(H) 41 32  ALT 0 - 44 U/L 84(H) 79(H) 67(H)    Lab Results  Component Value Date   WBC 2.4 (L) 05/11/2018   HGB 12.9 05/11/2018   HCT 39.0 05/11/2018   MCV 91.8 05/11/2018   PLT 334 05/11/2018   NEUTROABS 1.1 (L) 05/11/2018    ASSESSMENT & PLAN:  Malignant neoplasm of upper-outer quadrant of left breast in female, estrogen receptor positive ([place]) 01/02/2018: Palpable lump left breastsince December 2018, mammogram: Left breast UOQ 2.3 cm mass, axilla has several enlarged lymph nodes; 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  Recommendation: 1. Neoadjuvant chemotherapy with dose dense Adriamycin  and Cytoxan followed by Taxol weekly x12, switched to Abraxane after first cycle because of Taxol reaction causing severe palpitations 2. Followed by surgery (which could be mastectomy) withAXLND versusTAD 3. Followed by radiation 4. Followed by antiestrogen therapy  Review of scans: No evidence of metastatic disease by CT scans and bone scan. Breast MRI 01/10/2018: Necrotic mass 2.4 x 3.2 x 4 cm, 4 abnormal lymph nodes in the axilla ------------------------------------------------------------------------------ Current treatment:Completed 4 cycles ofdose dense Adriamycin and Cytoxan,afterreceivingTaxol,she had a reaction and weswitched to Abraxane starting 04/13/2018.  Today is cycle 7 Abraxane toxicities: Severe fatigue Denies neuropathy  Neutropenia: ANC 1.1.  We are proceeding with a treatment  however she will start Granix injections couple of days prior to each of her next chemotherapy treatments.  Zoladex injections monthly Monitoring closely for toxicities Return to clinic in 2 weeks for follow-up with me    No orders of the defined types were placed in this encounter.  The patient has a good understanding of the overall plan. she agrees with it. she will call with any problems that may develop before the next visit here.   Harriette Ohara, MD 05/11/18

## 2018-05-11 NOTE — Patient Instructions (Signed)
[facility] Discharge Instructions for Patients Receiving Chemotherapy  Today you received the following chemotherapy agents:  Abraxane.  To help prevent nausea and vomiting after your treatment, we encourage you to take your nausea medication as directed.   If you develop nausea and vomiting that is not controlled by your nausea medication, call the clinic.   BELOW ARE SYMPTOMS THAT SHOULD BE REPORTED IMMEDIATELY:  *FEVER GREATER THAN 100.5 F  *CHILLS WITH OR WITHOUT FEVER  NAUSEA AND VOMITING THAT IS NOT CONTROLLED WITH YOUR NAUSEA MEDICATION  *UNUSUAL SHORTNESS OF BREATH  *UNUSUAL BRUISING OR BLEEDING  TENDERNESS IN MOUTH AND THROAT WITH OR WITHOUT PRESENCE OF ULCERS  *URINARY PROBLEMS  *BOWEL PROBLEMS  UNUSUAL RASH Items with * indicate a potential emergency and should be followed up as soon as possible.  Feel free to call the clinic should you have any questions or concerns. The clinic phone number is [phone].  Please show the CHEMO ALERT CARD at check-in to the Emergency Department and triage nurse.   

## 2018-05-16 ENCOUNTER — Inpatient Hospital Stay: Payer: Commercial Managed Care - PPO

## 2018-05-16 VITALS — BP 122/92 | HR 103 | Temp 97.7°F | Resp 18

## 2018-05-16 DIAGNOSIS — Z170 Estrogen receptor positive status [ER+]: Secondary | ICD-10-CM

## 2018-05-16 DIAGNOSIS — Z5111 Encounter for antineoplastic chemotherapy: Secondary | ICD-10-CM | POA: Diagnosis not present

## 2018-05-16 DIAGNOSIS — C50412 Malignant neoplasm of upper-outer quadrant of left female breast: Secondary | ICD-10-CM

## 2018-05-16 MED ORDER — TBO-FILGRASTIM 480 MCG/0.8ML ~~LOC~~ SOSY
480.0000 ug | PREFILLED_SYRINGE | Freq: Once | SUBCUTANEOUS | Status: AC
  Administered 2018-05-16: 480 ug via SUBCUTANEOUS

## 2018-05-16 NOTE — Patient Instructions (Addendum)
Tbo-Filgrastim  (Granix) injection What is this medicine? TBO-FILGRASTIM (T B O fil GRA stim) is a granulocyte colony-stimulating factor that stimulates the growth of neutrophils, a type of white blood cell important in the body's fight against infection. It is used to reduce the incidence of fever and infection in patients with certain types of cancer who are receiving chemotherapy that affects the bone marrow. This medicine may be used for other purposes; ask your health care provider or pharmacist if you have questions. COMMON BRAND NAME(S): Granix What should I tell my health care provider before I take this medicine? They need to know if you have any of these conditions: -bone scan or tests planned -kidney disease -sickle cell anemia -an unusual or allergic reaction to tbo-filgrastim, filgrastim, pegfilgrastim, other medicines, foods, dyes, or preservatives -pregnant or trying to get pregnant -breast-feeding How should I use this medicine? This medicine is for injection under the skin. If you get this medicine at home, you will be taught how to prepare and give this medicine. Refer to the Instructions for Use that come with your medication packaging. Use exactly as directed. Take your medicine at regular intervals. Do not take your medicine more often than directed. It is important that you put your used needles and syringes in a special sharps container. Do not put them in a trash can. If you do not have a sharps container, call your pharmacist or healthcare provider to get one. Talk to your pediatrician regarding the use of this medicine in children. Special care may be needed. Overdosage: If you think you have taken too much of this medicine contact a poison control center or emergency room at once. NOTE: This medicine is only for you. Do not share this medicine with others. What if I miss a dose? It is important not to miss your dose. Call your doctor or health care professional if you  miss a dose. What may interact with this medicine? This medicine may interact with the following medications: -medicines that may cause a release of neutrophils, such as lithium This list may not describe all possible interactions. Give your health care provider a list of all the medicines, herbs, non-prescription drugs, or dietary supplements you use. Also tell them if you smoke, drink alcohol, or use illegal drugs. Some items may interact with your medicine. What should I watch for while using this medicine? You may need blood work done while you are taking this medicine. What side effects may I notice from receiving this medicine? Side effects that you should report to your doctor or health care professional as soon as possible: -allergic reactions like skin rash, itching or hives, swelling of the face, lips, or tongue -blood in the urine -dark urine -dizziness -fast heartbeat -feeling faint -shortness of breath or breathing problems -signs and symptoms of infection like fever or chills; cough; or sore throat -signs and symptoms of kidney injury like trouble passing urine or change in the amount of urine -stomach or side pain, or pain at the shoulder -sweating -swelling of the legs, ankles, or abdomen -tiredness Side effects that usually do not require medical attention (report to your doctor or health care professional if they continue or are bothersome): -bone pain -headache -muscle pain -vomiting This list may not describe all possible side effects. Call your doctor for medical advice about side effects. You may report side effects to FDA at 1-800-FDA-1088. Where should I keep my medicine? Keep out of the reach of children. Store in a  refrigerator between 2 and 8 degrees C (36 and 46 degrees F). Keep in carton to protect from light. Throw away this medicine if it is left out of the refrigerator for more than 5 consecutive days. Throw away any unused medicine after the expiration  date. NOTE: This sheet is a summary. It may not cover all possible information. If you have questions about this medicine, talk to your doctor, pharmacist, or health care provider.  2018 Elsevier/Gold Standard (2015-08-31 19:07:04)

## 2018-05-18 ENCOUNTER — Inpatient Hospital Stay: Payer: Commercial Managed Care - PPO

## 2018-05-18 VITALS — BP 128/80 | HR 103 | Temp 98.4°F | Resp 18

## 2018-05-18 DIAGNOSIS — Z170 Estrogen receptor positive status [ER+]: Secondary | ICD-10-CM

## 2018-05-18 DIAGNOSIS — C50412 Malignant neoplasm of upper-outer quadrant of left female breast: Secondary | ICD-10-CM

## 2018-05-18 DIAGNOSIS — Z95828 Presence of other vascular implants and grafts: Secondary | ICD-10-CM

## 2018-05-18 DIAGNOSIS — Z5111 Encounter for antineoplastic chemotherapy: Secondary | ICD-10-CM | POA: Diagnosis not present

## 2018-05-18 LAB — CMP (CANCER CENTER ONLY)
ALBUMIN: 4.4 g/dL (ref 3.5–5.0)
ALT: 64 U/L — ABNORMAL HIGH (ref 0–44)
ANION GAP: 9 (ref 5–15)
AST: 31 U/L (ref 15–41)
Alkaline Phosphatase: 75 U/L (ref 38–126)
BUN: 9 mg/dL (ref 6–20)
CHLORIDE: 107 mmol/L (ref 98–111)
CO2: 26 mmol/L (ref 22–32)
Calcium: 9.6 mg/dL (ref 8.9–10.3)
Creatinine: 0.73 mg/dL (ref 0.44–1.00)
GFR, Est AFR Am: >60 mL/min (ref >60)
GFR, Estimated: >60 mL/min (ref >60)
GLUCOSE: 86 mg/dL (ref 70–99)
POTASSIUM: 4.1 mmol/L (ref 3.5–5.1)
SODIUM: 142 mmol/L (ref 135–145)
TOTAL PROTEIN: 7.2 g/dL (ref 6.5–8.1)
Total Bilirubin: 0.3 mg/dL (ref 0.3–1.2)

## 2018-05-18 LAB — CBC WITH DIFFERENTIAL (CANCER CENTER ONLY)
ABS IMMATURE GRANULOCYTES: 0.11 10*3/uL — AB (ref 0.00–0.07)
BASOS PCT: 1 %
Basophils Absolute: 0.1 10*3/uL (ref 0.0–0.1)
Eosinophils Absolute: 0.1 10*3/uL (ref 0.0–0.5)
Eosinophils Relative: 1 %
HCT: 37.7 % (ref 36.0–46.0)
Hemoglobin: 12.3 g/dL (ref 12.0–15.0)
IMMATURE GRANULOCYTES: 1 %
Lymphocytes Relative: 14 %
Lymphs Abs: 1.3 10*3/uL (ref 0.7–4.0)
MCH: 30.8 pg (ref 26.0–34.0)
MCHC: 32.6 g/dL (ref 30.0–36.0)
MCV: 94.3 fL (ref 80.0–100.0)
MONOS PCT: 5 %
Monocytes Absolute: 0.5 10*3/uL (ref 0.1–1.0)
NEUTROS PCT: 78 %
Neutro Abs: 7.2 10*3/uL (ref 1.7–7.7)
PLATELETS: 297 10*3/uL (ref 150–400)
RBC: 4 MIL/uL (ref 3.87–5.11)
RDW: 13.2 % (ref 11.5–15.5)
WBC Count: 9.2 10*3/uL (ref 4.0–10.5)
nRBC: 0 % (ref 0.0–0.2)

## 2018-05-18 MED ORDER — SODIUM CHLORIDE 0.9% FLUSH
10.0000 mL | Freq: Once | INTRAVENOUS | Status: AC
  Administered 2018-05-18: 10 mL
  Filled 2018-05-18: qty 10

## 2018-05-18 MED ORDER — PROCHLORPERAZINE MALEATE 10 MG PO TABS
ORAL_TABLET | ORAL | Status: AC
  Filled 2018-05-18: qty 1

## 2018-05-18 MED ORDER — PROCHLORPERAZINE MALEATE 10 MG PO TABS
10.0000 mg | ORAL_TABLET | Freq: Once | ORAL | Status: AC
  Administered 2018-05-18: 10 mg via ORAL

## 2018-05-18 MED ORDER — PACLITAXEL PROTEIN-BOUND CHEMO INJECTION 100 MG
80.0000 mg/m2 | Freq: Once | INTRAVENOUS | Status: AC
  Administered 2018-05-18: 150 mg via INTRAVENOUS
  Filled 2018-05-18: qty 30

## 2018-05-18 MED ORDER — PACLITAXEL PROTEIN-BOUND CHEMO INJECTION 100 MG: 80.0000 mg/m2 | Freq: Once | Status: DC

## 2018-05-18 MED ORDER — SODIUM CHLORIDE 0.9 % IV SOLN
Freq: Once | INTRAVENOUS | Status: AC
  Administered 2018-05-18: 12:00:00 via INTRAVENOUS
  Filled 2018-05-18: qty 250

## 2018-05-18 MED ORDER — HEPARIN SOD (PORK) LOCK FLUSH 100 UNIT/ML IV SOLN
500.0000 [IU] | Freq: Once | INTRAVENOUS | Status: AC | PRN
  Administered 2018-05-18: 500 [IU]
  Filled 2018-05-18: qty 5

## 2018-05-18 MED ORDER — SODIUM CHLORIDE 0.9% FLUSH
10.0000 mL | INTRAVENOUS | Status: DC | PRN
  Administered 2018-05-18: 10 mL
  Filled 2018-05-18: qty 10

## 2018-05-18 NOTE — Patient Instructions (Signed)
[facility] Discharge Instructions for Patients Receiving Chemotherapy  Today you received the following chemotherapy agents:  Abraxane.  To help prevent nausea and vomiting after your treatment, we encourage you to take your nausea medication as directed.   If you develop nausea and vomiting that is not controlled by your nausea medication, call the clinic.   BELOW ARE SYMPTOMS THAT SHOULD BE REPORTED IMMEDIATELY:  *FEVER GREATER THAN 100.5 F  *CHILLS WITH OR WITHOUT FEVER  NAUSEA AND VOMITING THAT IS NOT CONTROLLED WITH YOUR NAUSEA MEDICATION  *UNUSUAL SHORTNESS OF BREATH  *UNUSUAL BRUISING OR BLEEDING  TENDERNESS IN MOUTH AND THROAT WITH OR WITHOUT PRESENCE OF ULCERS  *URINARY PROBLEMS  *BOWEL PROBLEMS  UNUSUAL RASH Items with * indicate a potential emergency and should be followed up as soon as possible.  Feel free to call the clinic should you have any questions or concerns. The clinic phone number is [phone].  Please show the CHEMO ALERT CARD at check-in to the Emergency Department and triage nurse.   

## 2018-05-23 ENCOUNTER — Inpatient Hospital Stay: Payer: Commercial Managed Care - PPO

## 2018-05-23 DIAGNOSIS — Z170 Estrogen receptor positive status [ER+]: Secondary | ICD-10-CM

## 2018-05-23 DIAGNOSIS — Z5111 Encounter for antineoplastic chemotherapy: Secondary | ICD-10-CM | POA: Diagnosis not present

## 2018-05-23 DIAGNOSIS — C50412 Malignant neoplasm of upper-outer quadrant of left female breast: Secondary | ICD-10-CM

## 2018-05-23 MED ORDER — TBO-FILGRASTIM 480 MCG/0.8ML ~~LOC~~ SOSY
480.0000 ug | PREFILLED_SYRINGE | Freq: Once | SUBCUTANEOUS | Status: AC
  Administered 2018-05-23: 480 ug via SUBCUTANEOUS

## 2018-05-23 MED ORDER — TBO-FILGRASTIM 480 MCG/0.8ML ~~LOC~~ SOSY
PREFILLED_SYRINGE | SUBCUTANEOUS | Status: AC
  Filled 2018-05-23: qty 0.8

## 2018-05-23 NOTE — Patient Instructions (Signed)
Tbo-Filgrastim  (Granix) injection What is this medicine? TBO-FILGRASTIM (T B O fil GRA stim) is a granulocyte colony-stimulating factor that stimulates the growth of neutrophils, a type of white blood cell important in the body's fight against infection. It is used to reduce the incidence of fever and infection in patients with certain types of cancer who are receiving chemotherapy that affects the bone marrow. This medicine may be used for other purposes; ask your health care provider or pharmacist if you have questions. COMMON BRAND NAME(S): Granix What should I tell my health care provider before I take this medicine? They need to know if you have any of these conditions: -bone scan or tests planned -kidney disease -sickle cell anemia -an unusual or allergic reaction to tbo-filgrastim, filgrastim, pegfilgrastim, other medicines, foods, dyes, or preservatives -pregnant or trying to get pregnant -breast-feeding How should I use this medicine? This medicine is for injection under the skin. If you get this medicine at home, you will be taught how to prepare and give this medicine. Refer to the Instructions for Use that come with your medication packaging. Use exactly as directed. Take your medicine at regular intervals. Do not take your medicine more often than directed. It is important that you put your used needles and syringes in a special sharps container. Do not put them in a trash can. If you do not have a sharps container, call your pharmacist or healthcare provider to get one. Talk to your pediatrician regarding the use of this medicine in children. Special care may be needed. Overdosage: If you think you have taken too much of this medicine contact a poison control center or emergency room at once. NOTE: This medicine is only for you. Do not share this medicine with others. What if I miss a dose? It is important not to miss your dose. Call your doctor or health care professional if you  miss a dose. What may interact with this medicine? This medicine may interact with the following medications: -medicines that may cause a release of neutrophils, such as lithium This list may not describe all possible interactions. Give your health care provider a list of all the medicines, herbs, non-prescription drugs, or dietary supplements you use. Also tell them if you smoke, drink alcohol, or use illegal drugs. Some items may interact with your medicine. What should I watch for while using this medicine? You may need blood work done while you are taking this medicine. What side effects may I notice from receiving this medicine? Side effects that you should report to your doctor or health care professional as soon as possible: -allergic reactions like skin rash, itching or hives, swelling of the face, lips, or tongue -blood in the urine -dark urine -dizziness -fast heartbeat -feeling faint -shortness of breath or breathing problems -signs and symptoms of infection like fever or chills; cough; or sore throat -signs and symptoms of kidney injury like trouble passing urine or change in the amount of urine -stomach or side pain, or pain at the shoulder -sweating -swelling of the legs, ankles, or abdomen -tiredness Side effects that usually do not require medical attention (report to your doctor or health care professional if they continue or are bothersome): -bone pain -headache -muscle pain -vomiting This list may not describe all possible side effects. Call your doctor for medical advice about side effects. You may report side effects to FDA at 1-800-FDA-1088. Where should I keep my medicine? Keep out of the reach of children. Store in a  refrigerator between 2 and 8 degrees C (36 and 46 degrees F). Keep in carton to protect from light. Throw away this medicine if it is left out of the refrigerator for more than 5 consecutive days. Throw away any unused medicine after the expiration  date. NOTE: This sheet is a summary. It may not cover all possible information. If you have questions about this medicine, talk to your doctor, pharmacist, or health care provider.  2018 Elsevier/Gold Standard (2015-08-31 19:07:04)

## 2018-05-25 ENCOUNTER — Encounter: Payer: Self-pay | Admitting: Adult Health

## 2018-05-25 ENCOUNTER — Inpatient Hospital Stay: Payer: Commercial Managed Care - PPO | Attending: Hematology and Oncology

## 2018-05-25 ENCOUNTER — Inpatient Hospital Stay: Payer: Commercial Managed Care - PPO

## 2018-05-25 ENCOUNTER — Inpatient Hospital Stay (HOSPITAL_BASED_OUTPATIENT_CLINIC_OR_DEPARTMENT_OTHER): Payer: Commercial Managed Care - PPO | Admitting: Adult Health

## 2018-05-25 VITALS — BP 130/82 | HR 96 | Temp 98.4°F | Resp 18 | Ht 65.0 in | Wt 145.9 lb

## 2018-05-25 DIAGNOSIS — Z800 Family history of malignant neoplasm of digestive organs: Secondary | ICD-10-CM

## 2018-05-25 DIAGNOSIS — Z170 Estrogen receptor positive status [ER+]: Secondary | ICD-10-CM | POA: Diagnosis not present

## 2018-05-25 DIAGNOSIS — C50412 Malignant neoplasm of upper-outer quadrant of left female breast: Secondary | ICD-10-CM

## 2018-05-25 DIAGNOSIS — C773 Secondary and unspecified malignant neoplasm of axilla and upper limb lymph nodes: Secondary | ICD-10-CM

## 2018-05-25 DIAGNOSIS — R530 Neoplastic (malignant) related fatigue: Secondary | ICD-10-CM

## 2018-05-25 DIAGNOSIS — Z803 Family history of malignant neoplasm of breast: Secondary | ICD-10-CM

## 2018-05-25 DIAGNOSIS — Z5111 Encounter for antineoplastic chemotherapy: Secondary | ICD-10-CM | POA: Insufficient documentation

## 2018-05-25 DIAGNOSIS — Z95828 Presence of other vascular implants and grafts: Secondary | ICD-10-CM

## 2018-05-25 DIAGNOSIS — Z8041 Family history of malignant neoplasm of ovary: Secondary | ICD-10-CM

## 2018-05-25 LAB — CBC WITH DIFFERENTIAL (CANCER CENTER ONLY)
ABS IMMATURE GRANULOCYTES: 0.08 10*3/uL — AB (ref 0.00–0.07)
Basophils Absolute: 0 10*3/uL (ref 0.0–0.1)
Basophils Relative: 0 %
Eosinophils Absolute: 0.1 10*3/uL (ref 0.0–0.5)
Eosinophils Relative: 1 %
HCT: 35.7 % — ABNORMAL LOW (ref 36.0–46.0)
Hemoglobin: 12 g/dL (ref 12.0–15.0)
Immature Granulocytes: 1 %
Lymphocytes Relative: 13 %
Lymphs Abs: 1.1 10*3/uL (ref 0.7–4.0)
MCH: 30.8 pg (ref 26.0–34.0)
MCHC: 33.6 g/dL (ref 30.0–36.0)
MCV: 91.8 fL (ref 80.0–100.0)
Monocytes Absolute: 0.4 10*3/uL (ref 0.1–1.0)
Monocytes Relative: 4 %
NEUTROS ABS: 7.1 10*3/uL (ref 1.7–7.7)
NRBC: 0 % (ref 0.0–0.2)
Neutrophils Relative %: 81 %
PLATELETS: 293 10*3/uL (ref 150–400)
RBC: 3.89 MIL/uL (ref 3.87–5.11)
RDW: 13.2 % (ref 11.5–15.5)
WBC: 8.8 10*3/uL (ref 4.0–10.5)

## 2018-05-25 LAB — CMP (CANCER CENTER ONLY)
ALT: 56 U/L — ABNORMAL HIGH (ref 0–44)
AST: 28 U/L (ref 15–41)
Albumin: 4.4 g/dL (ref 3.5–5.0)
Alkaline Phosphatase: 74 U/L (ref 38–126)
Anion gap: 11 (ref 5–15)
BUN: 9 mg/dL (ref 6–20)
CO2: 26 mmol/L (ref 22–32)
Calcium: 9.9 mg/dL (ref 8.9–10.3)
Chloride: 106 mmol/L (ref 98–111)
Creatinine: 0.76 mg/dL (ref 0.44–1.00)
Glucose, Bld: 80 mg/dL (ref 70–99)
POTASSIUM: 3.8 mmol/L (ref 3.5–5.1)
Sodium: 143 mmol/L (ref 135–145)
Total Bilirubin: 0.4 mg/dL (ref 0.3–1.2)
Total Protein: 7.1 g/dL (ref 6.5–8.1)

## 2018-05-25 MED ORDER — PACLITAXEL PROTEIN-BOUND CHEMO INJECTION 100 MG
80.0000 mg/m2 | Freq: Once | INTRAVENOUS | Status: AC
  Administered 2018-05-25: 150 mg via INTRAVENOUS
  Filled 2018-05-25: qty 30

## 2018-05-25 MED ORDER — PROCHLORPERAZINE MALEATE 10 MG PO TABS
10.0000 mg | ORAL_TABLET | Freq: Once | ORAL | Status: AC
  Administered 2018-05-25: 10 mg via ORAL

## 2018-05-25 MED ORDER — SODIUM CHLORIDE 0.9 % IV SOLN
Freq: Once | INTRAVENOUS | Status: AC
  Administered 2018-05-25: 12:00:00 via INTRAVENOUS
  Filled 2018-05-25: qty 250

## 2018-05-25 MED ORDER — PROCHLORPERAZINE MALEATE 10 MG PO TABS
ORAL_TABLET | ORAL | Status: AC
  Filled 2018-05-25: qty 1

## 2018-05-25 MED ORDER — SODIUM CHLORIDE 0.9% FLUSH
10.0000 mL | Freq: Once | INTRAVENOUS | Status: AC
  Administered 2018-05-25: 10 mL
  Filled 2018-05-25: qty 10

## 2018-05-25 MED ORDER — SODIUM CHLORIDE 0.9% FLUSH
10.0000 mL | INTRAVENOUS | Status: DC | PRN
  Administered 2018-05-25: 10 mL
  Filled 2018-05-25: qty 10

## 2018-05-25 MED ORDER — HEPARIN SOD (PORK) LOCK FLUSH 100 UNIT/ML IV SOLN
500.0000 [IU] | Freq: Once | INTRAVENOUS | Status: AC | PRN
  Administered 2018-05-25: 500 [IU]
  Filled 2018-05-25: qty 5

## 2018-05-25 NOTE — Patient Instructions (Signed)
[facility] Discharge Instructions for Patients Receiving Chemotherapy  Today you received the following chemotherapy agents:  Abraxane.  To help prevent nausea and vomiting after your treatment, we encourage you to take your nausea medication as directed.   If you develop nausea and vomiting that is not controlled by your nausea medication, call the clinic.   BELOW ARE SYMPTOMS THAT SHOULD BE REPORTED IMMEDIATELY:  *FEVER GREATER THAN 100.5 F  *CHILLS WITH OR WITHOUT FEVER  NAUSEA AND VOMITING THAT IS NOT CONTROLLED WITH YOUR NAUSEA MEDICATION  *UNUSUAL SHORTNESS OF BREATH  *UNUSUAL BRUISING OR BLEEDING  TENDERNESS IN MOUTH AND THROAT WITH OR WITHOUT PRESENCE OF ULCERS  *URINARY PROBLEMS  *BOWEL PROBLEMS  UNUSUAL RASH Items with * indicate a potential emergency and should be followed up as soon as possible.  Feel free to call the clinic should you have any questions or concerns. The clinic phone number is [phone].  Please show the CHEMO ALERT CARD at check-in to the Emergency Department and triage nurse.   

## 2018-05-25 NOTE — Progress Notes (Signed)
[facility] Follow up:    Kristi [name] [address] [place] [address]   DIAGNOSIS: Cancer Staging Malignant neoplasm of upper-outer quadrant of left breast in female, estrogen receptor positive ([place]) Staging form: Breast, AJCC 8th Edition - Clinical: Stage IIB (cT2, cN1, cM0, G3, ER+, PR+, HER2: Equivocal) - Signed by Nicholas Lose, MD on 01/04/2018   SUMMARY OF ONCOLOGIC HISTORY:   Malignant neoplasm of upper-outer quadrant of left breast in female, estrogen receptor positive ([place])   01/02/2018 Initial Diagnosis    Palpable lump left breast, mammogram: Left breast UOQ 2.3 cm mass, axilla has several enlarged lymph nodes; 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    01/04/2018 Cancer Staging    Staging form: Breast, AJCC 8th Edition - Clinical: Stage IIB (cT2, cN1, cM0, G3, ER+, PR+, HER2: Equivocal) - Signed by Nicholas Lose, MD on 01/04/2018    01/16/2018 -  Neo-Adjuvant Chemotherapy    Dose dense Adriamycin and Cytoxan x4 followed by Taxol, switched to Abraxane with cycle 2 after Taxol reaction with palpitations weekly x12    01/19/2018 Genetic Testing    STK11 c.608C>T VUS identified on the Multicancer panel.  The Multi-Gene Panel offered by [facility] includes sequencing and/or deletion duplication testing of the following 83 genes: ALK, APC, ATM, AXIN2,BAP1,  BARD1, BLM, BMPR1A, BRCA1, BRCA2, BRIP1, CASR, CDC73, CDH1, CDK4, CDKN1B, CDKN1C, CDKN2A (p14ARF), CDKN2A (p16INK4a), CEBPA, CHEK2, CTNNA1, DICER1, DIS3L2, EGFR (c.2369C>T, p.Thr790Met variant only), EPCAM (Deletion/duplication testing only), FH, FLCN, GATA2, GPC3, GREM1 (Promoter region deletion/duplication testing only), HOXB13 (c.251G>A, p.Gly84Glu), HRAS, KIT, MAX, MEN1, MET, MITF (c.952G>A, p.Glu318Lys variant only), MLH1, MSH2, MSH3, MSH6, MUTYH, NBN, NF1, NF2, NTHL1, PALB2, PDGFRA,  PHOX2B, PMS2, POLD1, POLE, POT1, PRKAR1A, PTCH1, PTEN, RAD50, RAD51C, RAD51D, RB1, RECQL4, RET, RUNX1, SDHAF2, SDHA (sequence changes only), SDHB, SDHC, SDHD, SMAD4, SMARCA4, SMARCB1, SMARCE1, STK11, SUFU, TERT, TERT, TMEM127, TP53, TSC1, TSC2, VHL, WRN and WT1.  The report date is January 19, 2018.     CURRENT THERAPY: Abraxane week 9  INTERVAL HISTORY: Kristi [name] 42 y.o. female returns for evaluation prior to receiving her neoadjuvant weekly Abraxane.  Today is week 9.  She is tolerating chemotherapy well with the exception of fatigue.  She is still able to work.  She is taking it easy however on the weekends.  She does have some diarrhea.  She notes that it is happening about 4-5 days out of the week, and varies in amount and frequency.     Patient Active Problem List   Diagnosis Date Noted  . Port-A-Cath in place 04/20/2018  . Genetic testing 01/29/2018  . Family history of breast cancer   . Family history of prostate cancer   . Family history of ovarian cancer   . Family history of pancreatic cancer   . Malignant neoplasm of upper-outer quadrant of left breast in female, estrogen receptor positive ([place]) 01/04/2018    is allergic to sulfa antibiotics.  MEDICAL HISTORY: Past Medical History:  Diagnosis Date  . Breast cancer, left ([place]) 12/2017  . Dental crowns present   . Endometriosis   . Family history of breast cancer   . Family history of ovarian cancer   . Family history of pancreatic cancer   . Family history of prostate cancer   . Migraines     SURGICAL HISTORY: Past Surgical History:  Procedure Laterality Date  .  OVARIAN CYST REMOVAL    . PORTACATH PLACEMENT Right 01/15/2018   Procedure: INSERTION PORT-A-CATH WITH [place];  Surgeon: Rolm Bookbinder, MD;  Location: [facility];  Service: General;  Laterality: Right;    SOCIAL HISTORY: Social History   Socioeconomic History  . Marital status: Single    Spouse name: Not on file  . Number of  children: Not on file  . Years of education: Not on file  . Highest education level: Not on file  Occupational History  . Not on file  Social Needs  . Financial resource strain: Not on file  . Food insecurity:    Worry: Not on file    Inability: Not on file  . Transportation needs:    Medical: Not on file    Non-medical: Not on file  Tobacco Use  . Smoking status: Never Smoker  . Smokeless tobacco: Never Used  Substance and Sexual Activity  . Alcohol use: Yes    Comment: occasionally  . Drug use: Never  . Sexual activity: Not on file  Lifestyle  . Physical activity:    Days per week: Not on file    Minutes per session: Not on file  . Stress: Not on file  Relationships  . Social connections:    Talks on phone: Not on file    Gets together: Not on file    Attends religious service: Not on file    Active member of club or organization: Not on file    Attends meetings of clubs or organizations: Not on file    Relationship status: Not on file  . Intimate partner violence:    Fear of current or ex partner: Not on file    Emotionally abused: Not on file    Physically abused: Not on file    Forced sexual activity: Not on file  Other Topics Concern  . Not on file  Social History Narrative  . Not on file    FAMILY HISTORY: Family History  Problem Relation Age of Onset  . Prostate cancer Father 31       Stage 1  . Ovarian cancer Maternal Aunt 19       ? Germ cell?  . Prostate cancer Maternal Uncle 65       prostectomy  . Pancreatic cancer Maternal Grandmother 33  . Colon cancer Maternal Grandfather 70  . Heart attack Paternal Grandmother   . Prostate cancer Paternal Grandfather        dx in his 57s, d. 10s-90s  . Breast cancer Other        MGMs sister dx over 98    Review of Systems  Constitutional: Negative for appetite change, chills, fatigue, fever and unexpected weight change.  HENT:   Negative for hearing loss, lump/mass and trouble swallowing.   Eyes:  Negative for eye problems and icterus.  Respiratory: Negative for chest tightness, cough and shortness of breath.   Cardiovascular: Negative for chest pain, leg swelling and palpitations.  Gastrointestinal: Negative for abdominal distention, abdominal pain, constipation, diarrhea, nausea and vomiting.  Skin: Negative for itching and rash.  Neurological: Negative for dizziness, extremity weakness and numbness.  Hematological: Negative for adenopathy. Does not bruise/bleed easily.  Psychiatric/Behavioral: Negative for depression. The patient is not nervous/anxious.       PHYSICAL EXAMINATION  ECOG PERFORMANCE STATUS: 1 - Symptomatic but completely ambulatory  Vitals:   05/25/18 1116  BP: 130/82  Pulse: 96  Resp: 18  Temp: 98.4 F (36.9 C)  SpO2: 100%    Physical Exam  Constitutional: She is oriented to person, place, and time. She appears well-developed and well-nourished.  HENT:  Head: Normocephalic and atraumatic.  Mouth/Throat: Oropharynx is clear and moist.  Eyes: Pupils are equal, round, and reactive to light. No scleral icterus.  Neck: Neck supple.  Cardiovascular: Normal rate, regular rhythm and normal heart sounds.  Pulmonary/Chest: Effort normal and breath sounds normal.  Unable to palpate mass in left upper outer quadrant  Abdominal: Soft. Bowel sounds are normal. She exhibits no distension and no mass. There is no tenderness. There is no rebound and no guarding.  Musculoskeletal: She exhibits no edema.  Lymphadenopathy:    She has no cervical adenopathy.  Neurological: She is alert and oriented to person, place, and time.  Skin: Skin is warm and dry. Capillary refill takes less than 2 seconds.  Psychiatric: She has a normal mood and affect.    LABORATORY DATA:  CBC    Component Value Date/Time   WBC 8.8 05/25/2018 1050   RBC 3.89 05/25/2018 1050   HGB 12.0 05/25/2018 1050   HCT 35.7 (L) 05/25/2018 1050   PLT 293 05/25/2018 1050   MCV 91.8 05/25/2018 1050    MCH 30.8 05/25/2018 1050   MCHC 33.6 05/25/2018 1050   RDW 13.2 05/25/2018 1050   LYMPHSABS 1.1 05/25/2018 1050   MONOABS 0.4 05/25/2018 1050   EOSABS 0.1 05/25/2018 1050   BASOSABS 0.0 05/25/2018 1050    CMP     Component Value Date/Time   NA 142 05/18/2018 1100   K 4.1 05/18/2018 1100   CL 107 05/18/2018 1100   CO2 26 05/18/2018 1100   GLUCOSE 86 05/18/2018 1100   BUN 9 05/18/2018 1100   CREATININE 0.73 05/18/2018 1100   CALCIUM 9.6 05/18/2018 1100   PROT 7.2 05/18/2018 1100   ALBUMIN 4.4 05/18/2018 1100   AST 31 05/18/2018 1100   ALT 64 (H) 05/18/2018 1100   ALKPHOS 75 05/18/2018 1100   BILITOT 0.3 05/18/2018 1100   GFRNONAA >60 05/18/2018 1100   GFRAA >60 05/18/2018 1100           ASSESSMENT and PLAN:   Malignant neoplasm of upper-outer quadrant of left breast in female, estrogen receptor positive ([place]) 01/02/2018: Palpable lump left breastsince December 2018, mammogram: Left breast UOQ 2.3 cm mass, axilla has several enlarged lymph nodes; 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  Recommendation: 1. Neoadjuvant chemotherapy with dose dense Adriamycin and Cytoxan followed by Taxol weekly x12, switched to Abraxane after first cycle because of Taxol reaction causing severe palpitations 2. Followed by surgery (which could be mastectomy) withAXLND versusTAD 3. Followed by radiation 4. Followed by antiestrogen therapy  Review of scans: No evidence of metastatic disease by CT scans and bone scan. Breast MRI 01/10/2018: Necrotic mass 2.4 x 3.2 x 4 cm, 4 abnormal lymph nodes in the axilla ------------------------------------------------------------------------------ Current treatment:Completed 4 cycles ofdose dense Adriamycin and Cytoxan,afterreceivingTaxol,she had a reaction and weswitched to Abraxane starting 04/13/2018.  Today is cycle 9  She has  some mild fatigue, managing this well.  She has previous neutropenia, and receives Granix on Wednesdays before treatment.  Her WBC are improved with this approach.  She is tolerating it well.    She knows to let us know if she develops any peripheral neuropathy.  So far she has had no peripheral neuropathy.  I went ahead and ordered her breast MRI.  She notes that she is scheduled for surgery on 07/02/2018.  She will complete chemotherapy on 11/22.    She will return weekly for labs and Abraxane and will see myself or Dr. Lindi Adie with every other treatment.     Orders Placed This Encounter  Procedures  . MR BREAST BILATERAL W WO CONTRAST INC CAD    Standing Status:   Future    Standing Expiration Date:   07/26/2019    Order Specific Question:   If indicated for the ordered procedure, I authorize the administration of contrast media per Radiology protocol    Answer:   Yes    Order Specific Question:   What is the patient's sedation requirement?    Answer:   No Sedation    Order Specific Question:   Does the patient have a pacemaker or implanted devices?    Answer:   No    Order Specific Question:   Radiology Contrast Protocol - do NOT remove file path    Answer:   [email or web address]    Order Specific Question:   Preferred imaging location?    Answer:   [facility] (table limit-350 lbs)    All questions were answered. The patient knows to call the clinic with any problems, questions or concerns. We can certainly see the patient much sooner if necessary.  A total of (30) minutes of face-to-face time was spent with this patient with greater than 50% of that time in counseling and care-coordination.  This note was electronically signed. Scot Dock, NP 05/25/2018

## 2018-05-25 NOTE — Assessment & Plan Note (Addendum)
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  Recommendation: 1. Neoadjuvant chemotherapy with dose dense Adriamycin and Cytoxan followed by Taxol weekly x12, switched to Abraxane after first cycle because of Taxol reaction causing severe palpitations 2. Followed by surgery (which could be mastectomy) withAXLND versusTAD 3. Followed by radiation 4. Followed by antiestrogen therapy  Review of scans: No evidence of metastatic disease by CT scans and bone scan. Breast MRI 01/10/2018: Necrotic mass 2.4 x 3.2 x 4 cm, 4 abnormal lymph nodes in the axilla ------------------------------------------------------------------------------ Current treatment:Completed 4 cycles ofdose dense Adriamycin and Cytoxan,afterreceivingTaxol,she had a reaction and weswitched to Abraxane starting 04/13/2018.  Today is cycle 9  She has some mild fatigue, managing this well.  She has previous neutropenia, and receives Granix on Wednesdays before treatment.  Her WBC are improved with this approach.  She is tolerating it well.    She knows to let us know if she develops any peripheral neuropathy.  So far she has had no peripheral neuropathy.    I went ahead and ordered her breast MRI.  She notes that she is scheduled for surgery on 07/02/2018.  She will complete chemotherapy on 11/22.    She will return weekly for labs and Abraxane and will see myself or Dr. Lindi Adie with every other treatment.

## 2018-05-29 ENCOUNTER — Telehealth: Payer: Self-pay | Admitting: Adult Health

## 2018-05-29 NOTE — Telephone Encounter (Signed)
Left message w/ appt date and time per 11/4 sch message.

## 2018-05-30 ENCOUNTER — Telehealth: Payer: Self-pay | Admitting: Adult Health

## 2018-05-30 ENCOUNTER — Inpatient Hospital Stay (HOSPITAL_BASED_OUTPATIENT_CLINIC_OR_DEPARTMENT_OTHER): Payer: Commercial Managed Care - PPO | Admitting: Medical

## 2018-05-30 ENCOUNTER — Other Ambulatory Visit: Payer: Self-pay | Admitting: Medical

## 2018-05-30 ENCOUNTER — Inpatient Hospital Stay: Payer: Commercial Managed Care - PPO

## 2018-05-30 DIAGNOSIS — Z5111 Encounter for antineoplastic chemotherapy: Secondary | ICD-10-CM | POA: Diagnosis not present

## 2018-05-30 DIAGNOSIS — C50412 Malignant neoplasm of upper-outer quadrant of left female breast: Secondary | ICD-10-CM

## 2018-05-30 DIAGNOSIS — Z170 Estrogen receptor positive status [ER+]: Secondary | ICD-10-CM

## 2018-05-30 DIAGNOSIS — J029 Acute pharyngitis, unspecified: Secondary | ICD-10-CM

## 2018-05-30 MED ORDER — TBO-FILGRASTIM 480 MCG/0.8ML ~~LOC~~ SOSY
480.0000 ug | PREFILLED_SYRINGE | Freq: Once | SUBCUTANEOUS | Status: AC
  Administered 2018-05-30: 480 ug via SUBCUTANEOUS

## 2018-05-30 MED ORDER — TBO-FILGRASTIM 480 MCG/0.8ML ~~LOC~~ SOSY
PREFILLED_SYRINGE | SUBCUTANEOUS | Status: AC
  Filled 2018-05-30: qty 0.8

## 2018-05-30 MED ORDER — AZITHROMYCIN 250 MG PO TABS: ORAL_TABLET | ORAL | 0 refills | Status: DC

## 2018-05-30 NOTE — Telephone Encounter (Signed)
Patient stopped by scheduling to move up her injection appt on the 20th due to the time of her MRI that is scheduled.  She also wanted to cancel her injection on the 27th because her treatment is cancelled on the 29th.

## 2018-06-01 ENCOUNTER — Inpatient Hospital Stay: Payer: Commercial Managed Care - PPO

## 2018-06-01 ENCOUNTER — Telehealth: Payer: Self-pay | Admitting: *Deleted

## 2018-06-01 VITALS — BP 131/90 | HR 88 | Temp 98.1°F | Resp 16

## 2018-06-01 DIAGNOSIS — Z170 Estrogen receptor positive status [ER+]: Secondary | ICD-10-CM

## 2018-06-01 DIAGNOSIS — C50412 Malignant neoplasm of upper-outer quadrant of left female breast: Secondary | ICD-10-CM

## 2018-06-01 DIAGNOSIS — Z5111 Encounter for antineoplastic chemotherapy: Secondary | ICD-10-CM | POA: Diagnosis not present

## 2018-06-01 LAB — CMP (CANCER CENTER ONLY)
ALBUMIN: 4.4 g/dL (ref 3.5–5.0)
ALT: 73 U/L — AB (ref 0–44)
AST: 35 U/L (ref 15–41)
Alkaline Phosphatase: 77 U/L (ref 38–126)
Anion gap: 9 (ref 5–15)
BUN: 10 mg/dL (ref 6–20)
CO2: 24 mmol/L (ref 22–32)
CREATININE: 0.74 mg/dL (ref 0.44–1.00)
Calcium: 9.5 mg/dL (ref 8.9–10.3)
Chloride: 108 mmol/L (ref 98–111)
GFR, Est AFR Am: >60 mL/min (ref >60)
GFR, Estimated: >60 mL/min (ref >60)
GLUCOSE: 86 mg/dL (ref 70–99)
POTASSIUM: 4.1 mmol/L (ref 3.5–5.1)
Sodium: 141 mmol/L (ref 135–145)
Total Bilirubin: 0.2 mg/dL — ABNORMAL LOW (ref 0.3–1.2)
Total Protein: 7.1 g/dL (ref 6.5–8.1)

## 2018-06-01 LAB — CBC WITH DIFFERENTIAL (CANCER CENTER ONLY)
ABS IMMATURE GRANULOCYTES: 0.09 10*3/uL — AB (ref 0.00–0.07)
Basophils Absolute: 0.1 10*3/uL (ref 0.0–0.1)
Basophils Relative: 1 %
EOS PCT: 1 %
Eosinophils Absolute: 0.1 10*3/uL (ref 0.0–0.5)
HCT: 36.9 % (ref 36.0–46.0)
HEMOGLOBIN: 12.4 g/dL (ref 12.0–15.0)
Immature Granulocytes: 1 %
LYMPHS PCT: 15 %
Lymphs Abs: 1.3 10*3/uL (ref 0.7–4.0)
MCH: 31.2 pg (ref 26.0–34.0)
MCHC: 33.6 g/dL (ref 30.0–36.0)
MCV: 92.7 fL (ref 80.0–100.0)
MONO ABS: 0.5 10*3/uL (ref 0.1–1.0)
MONOS PCT: 6 %
NEUTROS ABS: 6.6 10*3/uL (ref 1.7–7.7)
Neutrophils Relative %: 76 %
Platelet Count: 317 10*3/uL (ref 150–400)
RBC: 3.98 MIL/uL (ref 3.87–5.11)
RDW: 13.6 % (ref 11.5–15.5)
WBC Count: 8.7 10*3/uL (ref 4.0–10.5)
nRBC: 0 % (ref 0.0–0.2)

## 2018-06-01 MED ORDER — PACLITAXEL PROTEIN-BOUND CHEMO INJECTION 100 MG
80.0000 mg/m2 | Freq: Once | INTRAVENOUS | Status: AC
  Administered 2018-06-01: 150 mg via INTRAVENOUS
  Filled 2018-06-01: qty 30

## 2018-06-01 MED ORDER — PROCHLORPERAZINE MALEATE 10 MG PO TABS
10.0000 mg | ORAL_TABLET | Freq: Once | ORAL | Status: AC
  Administered 2018-06-01: 10 mg via ORAL

## 2018-06-01 MED ORDER — SODIUM CHLORIDE 0.9 % IV SOLN
Freq: Once | INTRAVENOUS | Status: AC
  Administered 2018-06-01: 12:00:00 via INTRAVENOUS
  Filled 2018-06-01: qty 250

## 2018-06-01 MED ORDER — SODIUM CHLORIDE 0.9% FLUSH
10.0000 mL | INTRAVENOUS | Status: DC | PRN
  Administered 2018-06-01: 10 mL
  Filled 2018-06-01: qty 10

## 2018-06-01 MED ORDER — PROCHLORPERAZINE MALEATE 10 MG PO TABS
ORAL_TABLET | ORAL | Status: AC
  Filled 2018-06-01: qty 1

## 2018-06-01 MED ORDER — HEPARIN SOD (PORK) LOCK FLUSH 100 UNIT/ML IV SOLN
500.0000 [IU] | Freq: Once | INTRAVENOUS | Status: AC | PRN
  Administered 2018-06-01: 500 [IU]
  Filled 2018-06-01: qty 5

## 2018-06-01 NOTE — Progress Notes (Signed)
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 Explained to pt risks of continuing tx today may include worsening neuropathy; pt voiced understanding and would like to proceed with tx today if she can.

## 2018-06-01 NOTE — Patient Instructions (Signed)
[facility] Discharge Instructions for Patients Receiving Chemotherapy  Today you received the following chemotherapy agents:  Abraxane.  To help prevent nausea and vomiting after your treatment, we encourage you to take your nausea medication as directed.   If you develop nausea and vomiting that is not controlled by your nausea medication, call the clinic.   BELOW ARE SYMPTOMS THAT SHOULD BE REPORTED IMMEDIATELY:  *FEVER GREATER THAN 100.5 F  *CHILLS WITH OR WITHOUT FEVER  NAUSEA AND VOMITING THAT IS NOT CONTROLLED WITH YOUR NAUSEA MEDICATION  *UNUSUAL SHORTNESS OF BREATH  *UNUSUAL BRUISING OR BLEEDING  TENDERNESS IN MOUTH AND THROAT WITH OR WITHOUT PRESENCE OF ULCERS  *URINARY PROBLEMS  *BOWEL PROBLEMS  UNUSUAL RASH Items with * indicate a potential emergency and should be followed up as soon as possible.  Feel free to call the clinic should you have any questions or concerns. The clinic phone number is [phone].  Please show the CHEMO ALERT CARD at check-in to the Emergency Department and triage nurse.   

## 2018-06-01 NOTE — Telephone Encounter (Signed)
Neuropathy present following 7 cycles Abraxane. On Call, Dr. Benay Spice, notified. Patient may hold treatment today to discuss further with Dr. Lindi Adie. Patient wishes to continue treatment on schedule today. Ok per Dr. Benay Spice to continue with the understanding that the PN could worsen. She should call the clinic with any concerns following treatment.

## 2018-06-04 NOTE — Progress Notes (Signed)
I was asked to see this patient in the flush room.  She is having a sore and scratchy throat.  She has been exposed to individuals with strep throat.  She reports that her nieces and nephew both have strep throat.  She has been around them recently.  She was given a prescription for a Z-Pak.  She will return as needed.  Sandi Mealy, MHS, PA-C Physician Assistant

## 2018-06-06 ENCOUNTER — Other Ambulatory Visit: Payer: Self-pay

## 2018-06-06 ENCOUNTER — Inpatient Hospital Stay: Payer: Commercial Managed Care - PPO

## 2018-06-06 ENCOUNTER — Other Ambulatory Visit: Payer: Self-pay | Admitting: *Deleted

## 2018-06-06 VITALS — BP 113/95 | HR 100 | Temp 98.7°F | Resp 16

## 2018-06-06 DIAGNOSIS — D701 Agranulocytosis secondary to cancer chemotherapy: Secondary | ICD-10-CM

## 2018-06-06 DIAGNOSIS — Z5111 Encounter for antineoplastic chemotherapy: Secondary | ICD-10-CM | POA: Diagnosis not present

## 2018-06-06 DIAGNOSIS — T451X5A Adverse effect of antineoplastic and immunosuppressive drugs, initial encounter: Secondary | ICD-10-CM

## 2018-06-06 DIAGNOSIS — C50412 Malignant neoplasm of upper-outer quadrant of left female breast: Secondary | ICD-10-CM

## 2018-06-06 DIAGNOSIS — Z170 Estrogen receptor positive status [ER+]: Secondary | ICD-10-CM

## 2018-06-06 MED ORDER — TBO-FILGRASTIM 300 MCG/0.5ML ~~LOC~~ SOSY
PREFILLED_SYRINGE | SUBCUTANEOUS | Status: AC
  Filled 2018-06-06: qty 0.5

## 2018-06-06 MED ORDER — TBO-FILGRASTIM 480 MCG/0.8ML ~~LOC~~ SOSY
480.0000 ug | PREFILLED_SYRINGE | Freq: Once | SUBCUTANEOUS | Status: AC
  Administered 2018-06-06: 480 ug via SUBCUTANEOUS

## 2018-06-06 NOTE — Patient Instructions (Signed)
Tbo-Filgrastim injection What is this medicine? TBO-FILGRASTIM (T B O fil GRA stim) is a granulocyte colony-stimulating factor that stimulates the growth of neutrophils, a type of white blood cell important in the body's fight against infection. It is used to reduce the incidence of fever and infection in patients with certain types of cancer who are receiving chemotherapy that affects the bone marrow. This medicine may be used for other purposes; ask your health care provider or pharmacist if you have questions. COMMON BRAND NAME(S): Granix What should I tell my health care provider before I take this medicine? They need to know if you have any of these conditions: -bone scan or tests planned -kidney disease -sickle cell anemia -an unusual or allergic reaction to tbo-filgrastim, filgrastim, pegfilgrastim, other medicines, foods, dyes, or preservatives -pregnant or trying to get pregnant -breast-feeding How should I use this medicine? This medicine is for injection under the skin. If you get this medicine at home, you will be taught how to prepare and give this medicine. Refer to the Instructions for Use that come with your medication packaging. Use exactly as directed. Take your medicine at regular intervals. Do not take your medicine more often than directed. It is important that you put your used needles and syringes in a special sharps container. Do not put them in a trash can. If you do not have a sharps container, call your pharmacist or healthcare provider to get one. Talk to your pediatrician regarding the use of this medicine in children. Special care may be needed. Overdosage: If you think you have taken too much of this medicine contact a poison control center or emergency room at once. NOTE: This medicine is only for you. Do not share this medicine with others. What if I miss a dose? It is important not to miss your dose. Call your doctor or health care professional if you miss a  dose. What may interact with this medicine? This medicine may interact with the following medications: -medicines that may cause a release of neutrophils, such as lithium This list may not describe all possible interactions. Give your health care provider a list of all the medicines, herbs, non-prescription drugs, or dietary supplements you use. Also tell them if you smoke, drink alcohol, or use illegal drugs. Some items may interact with your medicine. What should I watch for while using this medicine? You may need blood work done while you are taking this medicine. What side effects may I notice from receiving this medicine? Side effects that you should report to your doctor or health care professional as soon as possible: -allergic reactions like skin rash, itching or hives, swelling of the face, lips, or tongue -blood in the urine -dark urine -dizziness -fast heartbeat -feeling faint -shortness of breath or breathing problems -signs and symptoms of infection like fever or chills; cough; or sore throat -signs and symptoms of kidney injury like trouble passing urine or change in the amount of urine -stomach or side pain, or pain at the shoulder -sweating -swelling of the legs, ankles, or abdomen -tiredness Side effects that usually do not require medical attention (report to your doctor or health care professional if they continue or are bothersome): -bone pain -headache -muscle pain -vomiting This list may not describe all possible side effects. Call your doctor for medical advice about side effects. You may report side effects to FDA at 1-800-FDA-1088. Where should I keep my medicine? Keep out of the reach of children. Store in a refrigerator between   2 and 8 degrees C (36 and 46 degrees F). Keep in carton to protect from light. Throw away this medicine if it is left out of the refrigerator for more than 5 consecutive days. Throw away any unused medicine after the expiration  date. NOTE: This sheet is a summary. It may not cover all possible information. If you have questions about this medicine, talk to your doctor, pharmacist, or health care provider.  2018 Elsevier/Gold Standard (2015-08-31 19:07:04)  

## 2018-06-08 ENCOUNTER — Inpatient Hospital Stay: Payer: Commercial Managed Care - PPO

## 2018-06-08 ENCOUNTER — Encounter: Payer: Self-pay | Admitting: Adult Health

## 2018-06-08 ENCOUNTER — Encounter: Payer: Self-pay | Admitting: *Deleted

## 2018-06-08 ENCOUNTER — Inpatient Hospital Stay (HOSPITAL_BASED_OUTPATIENT_CLINIC_OR_DEPARTMENT_OTHER): Payer: Commercial Managed Care - PPO | Admitting: Adult Health

## 2018-06-08 VITALS — BP 127/88

## 2018-06-08 VITALS — BP 130/94 | HR 100 | Temp 98.5°F | Resp 18 | Ht 65.0 in | Wt 148.5 lb

## 2018-06-08 DIAGNOSIS — Z170 Estrogen receptor positive status [ER+]: Secondary | ICD-10-CM

## 2018-06-08 DIAGNOSIS — C50412 Malignant neoplasm of upper-outer quadrant of left female breast: Secondary | ICD-10-CM

## 2018-06-08 DIAGNOSIS — Z5111 Encounter for antineoplastic chemotherapy: Secondary | ICD-10-CM | POA: Diagnosis not present

## 2018-06-08 DIAGNOSIS — C773 Secondary and unspecified malignant neoplasm of axilla and upper limb lymph nodes: Secondary | ICD-10-CM | POA: Diagnosis not present

## 2018-06-08 DIAGNOSIS — R530 Neoplastic (malignant) related fatigue: Secondary | ICD-10-CM

## 2018-06-08 DIAGNOSIS — Z95828 Presence of other vascular implants and grafts: Secondary | ICD-10-CM

## 2018-06-08 DIAGNOSIS — T451X5A Adverse effect of antineoplastic and immunosuppressive drugs, initial encounter: Secondary | ICD-10-CM

## 2018-06-08 DIAGNOSIS — G620 Drug-induced polyneuropathy: Secondary | ICD-10-CM | POA: Diagnosis not present

## 2018-06-08 DIAGNOSIS — D701 Agranulocytosis secondary to cancer chemotherapy: Secondary | ICD-10-CM

## 2018-06-08 LAB — CBC WITH DIFFERENTIAL (CANCER CENTER ONLY)
Abs Immature Granulocytes: 0.15 10*3/uL — ABNORMAL HIGH (ref 0.00–0.07)
BASOS ABS: 0.1 10*3/uL (ref 0.0–0.1)
Basophils Relative: 1 %
EOS ABS: 0.1 10*3/uL (ref 0.0–0.5)
Eosinophils Relative: 1 %
HEMATOCRIT: 37.5 % (ref 36.0–46.0)
HEMOGLOBIN: 12.4 g/dL (ref 12.0–15.0)
Immature Granulocytes: 1 %
LYMPHS ABS: 1.4 10*3/uL (ref 0.7–4.0)
LYMPHS PCT: 13 %
MCH: 30.7 pg (ref 26.0–34.0)
MCHC: 33.1 g/dL (ref 30.0–36.0)
MCV: 92.8 fL (ref 80.0–100.0)
MONO ABS: 0.6 10*3/uL (ref 0.1–1.0)
MONOS PCT: 6 %
Neutro Abs: 8.3 10*3/uL — ABNORMAL HIGH (ref 1.7–7.7)
Neutrophils Relative %: 78 %
Platelet Count: 338 10*3/uL (ref 150–400)
RBC: 4.04 MIL/uL (ref 3.87–5.11)
RDW: 14 % (ref 11.5–15.5)
WBC Count: 10.6 10*3/uL — ABNORMAL HIGH (ref 4.0–10.5)
nRBC: 0 % (ref 0.0–0.2)

## 2018-06-08 LAB — CMP (CANCER CENTER ONLY)
ALK PHOS: 83 U/L (ref 38–126)
ALT: 73 U/L — AB (ref 0–44)
AST: 30 U/L (ref 15–41)
Albumin: 4.4 g/dL (ref 3.5–5.0)
Anion gap: 8 (ref 5–15)
BUN: 12 mg/dL (ref 6–20)
CALCIUM: 9.9 mg/dL (ref 8.9–10.3)
CHLORIDE: 105 mmol/L (ref 98–111)
CO2: 27 mmol/L (ref 22–32)
Creatinine: 0.77 mg/dL (ref 0.44–1.00)
GFR, Est AFR Am: >60 mL/min (ref >60)
GFR, Estimated: >60 mL/min (ref >60)
GLUCOSE: 99 mg/dL (ref 70–99)
Potassium: 3.8 mmol/L (ref 3.5–5.1)
SODIUM: 140 mmol/L (ref 135–145)
Total Bilirubin: 0.2 mg/dL — ABNORMAL LOW (ref 0.3–1.2)
Total Protein: 7.1 g/dL (ref 6.5–8.1)

## 2018-06-08 MED ORDER — GOSERELIN ACETATE 3.6 MG ~~LOC~~ IMPL
DRUG_IMPLANT | SUBCUTANEOUS | Status: AC
  Filled 2018-06-08: qty 3.6

## 2018-06-08 MED ORDER — SODIUM CHLORIDE 0.9% FLUSH
10.0000 mL | INTRAVENOUS | Status: DC | PRN
  Administered 2018-06-08: 10 mL
  Filled 2018-06-08: qty 10

## 2018-06-08 MED ORDER — PROCHLORPERAZINE MALEATE 10 MG PO TABS
ORAL_TABLET | ORAL | Status: AC
  Filled 2018-06-08: qty 1

## 2018-06-08 MED ORDER — HEPARIN SOD (PORK) LOCK FLUSH 100 UNIT/ML IV SOLN
500.0000 [IU] | Freq: Once | INTRAVENOUS | Status: AC | PRN
  Administered 2018-06-08: 500 [IU]
  Filled 2018-06-08: qty 5

## 2018-06-08 MED ORDER — PROCHLORPERAZINE MALEATE 10 MG PO TABS
10.0000 mg | ORAL_TABLET | Freq: Once | ORAL | Status: AC
  Administered 2018-06-08: 10 mg via ORAL

## 2018-06-08 MED ORDER — GOSERELIN ACETATE 3.6 MG ~~LOC~~ IMPL
3.6000 mg | DRUG_IMPLANT | Freq: Once | SUBCUTANEOUS | Status: AC
  Administered 2018-06-08: 3.6 mg via SUBCUTANEOUS

## 2018-06-08 MED ORDER — PACLITAXEL PROTEIN-BOUND CHEMO INJECTION 100 MG
80.0000 mg/m2 | Freq: Once | INTRAVENOUS | Status: AC
  Administered 2018-06-08: 150 mg via INTRAVENOUS
  Filled 2018-06-08: qty 30

## 2018-06-08 MED ORDER — SODIUM CHLORIDE 0.9 % IV SOLN
Freq: Once | INTRAVENOUS | Status: AC
  Administered 2018-06-08: 14:00:00 via INTRAVENOUS
  Filled 2018-06-08: qty 250

## 2018-06-08 NOTE — Patient Instructions (Signed)
[facility] Discharge Instructions for Patients Receiving Chemotherapy  Today you received the following chemotherapy agents:  Abraxane.  To help prevent nausea and vomiting after your treatment, we encourage you to take your nausea medication as directed.   If you develop nausea and vomiting that is not controlled by your nausea medication, call the clinic.   BELOW ARE SYMPTOMS THAT SHOULD BE REPORTED IMMEDIATELY:  *FEVER GREATER THAN 100.5 F  *CHILLS WITH OR WITHOUT FEVER  NAUSEA AND VOMITING THAT IS NOT CONTROLLED WITH YOUR NAUSEA MEDICATION  *UNUSUAL SHORTNESS OF BREATH  *UNUSUAL BRUISING OR BLEEDING  TENDERNESS IN MOUTH AND THROAT WITH OR WITHOUT PRESENCE OF ULCERS  *URINARY PROBLEMS  *BOWEL PROBLEMS  UNUSUAL RASH Items with * indicate a potential emergency and should be followed up as soon as possible.  Feel free to call the clinic should you have any questions or concerns. The clinic phone number is [phone].  Please show the CHEMO ALERT CARD at check-in to the Emergency Department and triage nurse.   

## 2018-06-08 NOTE — Progress Notes (Signed)
[facility] Follow up:    Kristi [name] [id number] [place] [place] [address]   DIAGNOSIS: Cancer Staging Malignant neoplasm of upper-outer quadrant of left breast in female, estrogen receptor positive ([place]) Staging form: Breast, AJCC 8th Edition - Clinical: Stage IIB (cT2, cN1, cM0, G3, ER+, PR+, HER2: Equivocal) - Signed by Nicholas Lose, MD on 01/04/2018   SUMMARY OF ONCOLOGIC HISTORY:   Malignant neoplasm of upper-outer quadrant of left breast in female, estrogen receptor positive ([place])   01/02/2018 Initial Diagnosis    Palpable lump left breast, mammogram: Left breast UOQ 2.3 cm mass, axilla has several enlarged lymph nodes; 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    01/04/2018 Cancer Staging    Staging form: Breast, AJCC 8th Edition - Clinical: Stage IIB (cT2, cN1, cM0, G3, ER+, PR+, HER2: Equivocal) - Signed by Nicholas Lose, MD on 01/04/2018    01/16/2018 -  Neo-Adjuvant Chemotherapy    Dose dense Adriamycin and Cytoxan x4 followed by Taxol, switched to Abraxane with cycle 2 after Taxol reaction with palpitations weekly x12    01/19/2018 Genetic Testing    STK11 c.608C>T VUS identified on the Multicancer panel.  The Multi-Gene Panel offered by [facility] includes sequencing and/or deletion duplication testing of the following 83 genes: ALK, APC, ATM, AXIN2,BAP1,  BARD1, BLM, BMPR1A, BRCA1, BRCA2, BRIP1, CASR, CDC73, CDH1, CDK4, CDKN1B, CDKN1C, CDKN2A (p14ARF), CDKN2A (p16INK4a), CEBPA, CHEK2, CTNNA1, DICER1, DIS3L2, EGFR (c.2369C>T, p.Thr790Met variant only), EPCAM (Deletion/duplication testing only), FH, FLCN, GATA2, GPC3, GREM1 (Promoter region deletion/duplication testing only), HOXB13 (c.251G>A, p.Gly84Glu), HRAS, KIT, MAX, MEN1, MET, MITF (c.952G>A, p.Glu318Lys variant only), MLH1, MSH2, MSH3, MSH6, MUTYH, NBN, NF1, NF2, NTHL1, PALB2, PDGFRA,  PHOX2B, PMS2, POLD1, POLE, POT1, PRKAR1A, PTCH1, PTEN, RAD50, RAD51C, RAD51D, RB1, RECQL4, RET, RUNX1, SDHAF2, SDHA (sequence changes only), SDHB, SDHC, SDHD, SMAD4, SMARCA4, SMARCB1, SMARCE1, STK11, SUFU, TERT, TERT, TMEM127, TP53, TSC1, TSC2, VHL, WRN and WT1.  The report date is January 19, 2018.     CURRENT THERAPY: abraxane week 11 (including 2 weeks of Taxol)  INTERVAL HISTORY: Kristi [name] 42 y.o. female returns for follow up prior to week 11 of Abraxane.  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  She noted this a day prior to her week 10 treatment.  She saw Lucianne Lei last week for a sore throat and was given a prescription for Zpak.  Her sore throat has resolved and she is feeling well today.     Patient Active Problem List   Diagnosis Date Noted  . Chemotherapy induced neutropenia ([place]) 06/06/2018  . Port-A-Cath in place 04/20/2018  . Genetic testing 01/29/2018  . Family history of breast cancer   . Family history of prostate cancer   . Family history of ovarian cancer   . Family history of pancreatic cancer   . Malignant neoplasm of upper-outer quadrant of left breast in female, estrogen receptor positive ([place]) 01/04/2018    is allergic to sulfa antibiotics.  MEDICAL HISTORY: Past Medical History:  Diagnosis Date  . Breast cancer, left ([place]) 12/2017  . Dental crowns present   . Endometriosis   . Family history of breast cancer   . Family history of ovarian cancer   . Family history of pancreatic cancer   . Family history of prostate cancer   . Migraines     SURGICAL HISTORY: Past  Surgical History:  Procedure Laterality Date  . OVARIAN CYST REMOVAL    . PORTACATH PLACEMENT Right 01/15/2018   Procedure: INSERTION PORT-A-CATH WITH [place];  Surgeon: Rolm Bookbinder, MD;  Location: [facility];  Service: General;  Laterality: Right;    SOCIAL HISTORY: Social History   Socioeconomic History  . Marital status:  Single    Spouse name: Not on file  . Number of children: Not on file  . Years of education: Not on file  . Highest education level: Not on file  Occupational History  . Not on file  Social Needs  . Financial resource strain: Not on file  . Food insecurity:    Worry: Not on file    Inability: Not on file  . Transportation needs:    Medical: Not on file    Non-medical: Not on file  Tobacco Use  . Smoking status: Never Smoker  . Smokeless tobacco: Never Used  Substance and Sexual Activity  . Alcohol use: Yes    Comment: occasionally  . Drug use: Never  . Sexual activity: Not on file  Lifestyle  . Physical activity:    Days per week: Not on file    Minutes per session: Not on file  . Stress: Not on file  Relationships  . Social connections:    Talks on phone: Not on file    Gets together: Not on file    Attends religious service: Not on file    Active member of club or organization: Not on file    Attends meetings of clubs or organizations: Not on file    Relationship status: Not on file  . Intimate partner violence:    Fear of current or ex partner: Not on file    Emotionally abused: Not on file    Physically abused: Not on file    Forced sexual activity: Not on file  Other Topics Concern  . Not on file  Social History Narrative  . Not on file    FAMILY HISTORY: Family History  Problem Relation Age of Onset  . Prostate cancer Father 71       Stage 1  . Ovarian cancer Maternal Aunt 19       ? Germ cell?  . Prostate cancer Maternal Uncle 65       prostectomy  . Pancreatic cancer Maternal Grandmother 64  . Colon cancer Maternal Grandfather 6  . Heart attack Paternal Grandmother   . Prostate cancer Paternal Grandfather        dx in his 44s, d. 46s-90s  . Breast cancer Other        MGMs sister dx over 39    Review of Systems  Constitutional: Positive for fatigue. Negative for appetite change, chills, fever and unexpected weight change.  HENT:   Negative  for hearing loss, lump/mass and trouble swallowing.   Eyes: Negative for eye problems and icterus.  Respiratory: Negative for chest tightness, cough and shortness of breath.   Cardiovascular: Negative for chest pain, leg swelling and palpitations.  Gastrointestinal: Negative for abdominal distention, abdominal pain, constipation, diarrhea, nausea and vomiting.  Endocrine: Negative for hot flashes.  Skin: Negative for itching and rash.  Neurological: Positive for numbness. Negative for dizziness and extremity weakness.  Hematological: Negative for adenopathy. Does not bruise/bleed easily.  Psychiatric/Behavioral: Negative for depression. The patient is not nervous/anxious.       PHYSICAL EXAMINATION  ECOG PERFORMANCE STATUS: 1 - Symptomatic but completely ambulatory  Vitals:  06/08/18 1258  BP: (!) 130/94  Pulse: 100  Resp: 18  Temp: 98.5 F (36.9 C)  SpO2: 100%    Physical Exam  Constitutional: She is oriented to person, place, and time. She appears well-developed and well-nourished.  HENT:  Head: Normocephalic and atraumatic.  Eyes: Pupils are equal, round, and reactive to light. No scleral icterus.  Neck: Neck supple.  Cardiovascular: Normal rate and regular rhythm.  Pulmonary/Chest: Effort normal and breath sounds normal.  Unable to palpate left breast mass   Abdominal: Soft. Bowel sounds are normal.  Musculoskeletal: She exhibits no edema.  Lymphadenopathy:    She has no cervical adenopathy.  Neurological: She is alert and oriented to person, place, and time.  Skin: Capillary refill takes less than 2 seconds.  Psychiatric: She has a normal mood and affect.    LABORATORY DATA:  CBC    Component Value Date/Time   WBC 10.6 (H) 06/08/2018 1210   RBC 4.04 06/08/2018 1210   HGB 12.4 06/08/2018 1210   HCT 37.5 06/08/2018 1210   PLT 338 06/08/2018 1210   MCV 92.8 06/08/2018 1210   MCH 30.7 06/08/2018 1210   MCHC 33.1 06/08/2018 1210   RDW 14.0 06/08/2018 1210    LYMPHSABS 1.4 06/08/2018 1210   MONOABS 0.6 06/08/2018 1210   EOSABS 0.1 06/08/2018 1210   BASOSABS 0.1 06/08/2018 1210    CMP     Component Value Date/Time   NA 140 06/08/2018 1210   K 3.8 06/08/2018 1210   CL 105 06/08/2018 1210   CO2 27 06/08/2018 1210   GLUCOSE 99 06/08/2018 1210   BUN 12 06/08/2018 1210   CREATININE 0.77 06/08/2018 1210   CALCIUM 9.9 06/08/2018 1210   PROT 7.1 06/08/2018 1210   ALBUMIN 4.4 06/08/2018 1210   AST 30 06/08/2018 1210   ALT 73 (H) 06/08/2018 1210   ALKPHOS 83 06/08/2018 1210   BILITOT 0.2 (L) 06/08/2018 1210   GFRNONAA >60 06/08/2018 1210   GFRAA >60 06/08/2018 1210        ASSESSMENT and PLAN:   Malignant neoplasm of upper-outer quadrant of left breast in female, estrogen receptor positive ([place]) 01/02/2018: Palpable lump left breastsince December 2018, mammogram: Left breast UOQ 2.3 cm mass, axilla has several enlarged lymph nodes; 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  Recommendation: 1. Neoadjuvant chemotherapy with dose dense Adriamycin and Cytoxan followed by Taxol weekly x12, switched to Abraxane after first cycle because of Taxol reaction causing severe palpitations 2. Followed by surgery (which could be mastectomy) withAXLND versusTAD 3. Followed by radiation 4. Followed by antiestrogen therapy  Review of scans: No evidence of metastatic disease by CT scans and bone scan. Breast MRI 01/10/2018: Necrotic mass 2.4 x 3.2 x 4 cm, 4 abnormal lymph nodes in the axilla ------------------------------------------------------------------------------ Current treatment:Completed 4 cycles ofdose dense Adriamycin and Cytoxan,afterreceivingTaxol,she had a reaction and weswitched to Abraxane starting 04/13/2018.  Today is cycle 11  1. Fatigue 2. Peripheral neuropathy: mild/intermittent/no motor deficits  I reviewed the  above in detail with Dr. Lindi Adie.  She will proceed with chemotherapy.  I recommended that if her neuropathy worsened and became continuous to please call us and let us know.     Dispo:   MRI Breast: 06/13/18 Lab/Abraxane 12: 06/15/2018 F/u with Dr. Lindi Adie: 06/27/2018  Reviewed this patient with Bary Castilla and Iris Pert our breast navigators.    All questions  were answered. The patient knows to call the clinic with any problems, questions or concerns. We can certainly see the patient much sooner if necessary.  A total of (20) minutes of face-to-face time was spent with this patient with greater than 50% of that time in counseling and care-coordination.  This note was electronically signed. Scot Dock, NP 06/08/2018

## 2018-06-08 NOTE — Assessment & Plan Note (Signed)
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  Recommendation: 1. Neoadjuvant chemotherapy with dose dense Adriamycin and Cytoxan followed by Taxol weekly x12, switched to Abraxane after first cycle because of Taxol reaction causing severe palpitations 2. Followed by surgery (which could be mastectomy) withAXLND versusTAD 3. Followed by radiation 4. Followed by antiestrogen therapy  Review of scans: No evidence of metastatic disease by CT scans and bone scan. Breast MRI 01/10/2018: Necrotic mass 2.4 x 3.2 x 4 cm, 4 abnormal lymph nodes in the axilla ------------------------------------------------------------------------------ Current treatment:Completed 4 cycles ofdose dense Adriamycin and Cytoxan,afterreceivingTaxol,she had a reaction and weswitched to Abraxane starting 04/13/2018.  Today is cycle 11  1. Fatigue 2. Peripheral neuropathy: mild/intermittent/no motor deficits  I reviewed the above in detail with Dr. Lindi Adie.  She will proceed with chemotherapy.  I recommended that if her neuropathy worsened and became continuous to please call us and let us know.     Dispo:   MRI Breast: 06/13/18 Lab/Abraxane 12: 06/15/2018 F/u with Dr. Lindi Adie: 06/27/2018  Reviewed this patient with Bary Castilla and Iris Pert our breast navigators.

## 2018-06-13 ENCOUNTER — Ambulatory Visit: Payer: Commercial Managed Care - PPO

## 2018-06-13 ENCOUNTER — Inpatient Hospital Stay: Payer: Commercial Managed Care - PPO

## 2018-06-13 ENCOUNTER — Ambulatory Visit (HOSPITAL_COMMUNITY)
Admission: RE | Admit: 2018-06-13 | Discharge: 2018-06-13 | Disposition: A | Discharge status: Home / Self Care | Payer: Commercial Managed Care - PPO | Source: Ambulatory Visit | Attending: Adult Health | Admitting: Adult Health

## 2018-06-13 VITALS — BP 126/84 | HR 105 | Temp 98.1°F | Resp 16

## 2018-06-13 DIAGNOSIS — C50412 Malignant neoplasm of upper-outer quadrant of left female breast: Secondary | ICD-10-CM | POA: Insufficient documentation

## 2018-06-13 DIAGNOSIS — Z5111 Encounter for antineoplastic chemotherapy: Secondary | ICD-10-CM | POA: Diagnosis not present

## 2018-06-13 DIAGNOSIS — Z170 Estrogen receptor positive status [ER+]: Secondary | ICD-10-CM

## 2018-06-13 MED ORDER — TBO-FILGRASTIM 480 MCG/0.8ML ~~LOC~~ SOSY
PREFILLED_SYRINGE | SUBCUTANEOUS | Status: AC
  Filled 2018-06-13: qty 0.8

## 2018-06-13 MED ORDER — TBO-FILGRASTIM 480 MCG/0.8ML ~~LOC~~ SOSY
480.0000 ug | PREFILLED_SYRINGE | Freq: Once | SUBCUTANEOUS | Status: AC
  Administered 2018-06-13: 480 ug via SUBCUTANEOUS

## 2018-06-13 MED ORDER — GADOBUTROL 1 MMOL/ML IV SOLN
7.0000 mL | Freq: Once | INTRAVENOUS | Status: AC | PRN
  Administered 2018-06-13: 7 mL via INTRAVENOUS

## 2018-06-15 ENCOUNTER — Telehealth: Payer: Self-pay | Admitting: Hematology and Oncology

## 2018-06-15 ENCOUNTER — Inpatient Hospital Stay: Payer: Commercial Managed Care - PPO

## 2018-06-15 VITALS — BP 114/85 | HR 97 | Temp 97.7°F | Resp 16

## 2018-06-15 DIAGNOSIS — Z5111 Encounter for antineoplastic chemotherapy: Secondary | ICD-10-CM | POA: Diagnosis not present

## 2018-06-15 DIAGNOSIS — Z170 Estrogen receptor positive status [ER+]: Secondary | ICD-10-CM

## 2018-06-15 DIAGNOSIS — Z95828 Presence of other vascular implants and grafts: Secondary | ICD-10-CM

## 2018-06-15 DIAGNOSIS — T451X5A Adverse effect of antineoplastic and immunosuppressive drugs, initial encounter: Secondary | ICD-10-CM

## 2018-06-15 DIAGNOSIS — C50412 Malignant neoplasm of upper-outer quadrant of left female breast: Secondary | ICD-10-CM

## 2018-06-15 DIAGNOSIS — D701 Agranulocytosis secondary to cancer chemotherapy: Secondary | ICD-10-CM

## 2018-06-15 LAB — CMP (CANCER CENTER ONLY)
ALT: 121 U/L — ABNORMAL HIGH (ref 0–44)
ANION GAP: 11 (ref 5–15)
AST: 47 U/L — ABNORMAL HIGH (ref 15–41)
Albumin: 4.6 g/dL (ref 3.5–5.0)
Alkaline Phosphatase: 89 U/L (ref 38–126)
BUN: 9 mg/dL (ref 6–20)
CO2: 24 mmol/L (ref 22–32)
Calcium: 9.7 mg/dL (ref 8.9–10.3)
Chloride: 106 mmol/L (ref 98–111)
Creatinine: 0.81 mg/dL (ref 0.44–1.00)
GFR, Estimated: >60 mL/min (ref >60)
Glucose, Bld: 82 mg/dL (ref 70–99)
POTASSIUM: 4 mmol/L (ref 3.5–5.1)
SODIUM: 141 mmol/L (ref 135–145)
Total Bilirubin: 0.3 mg/dL (ref 0.3–1.2)
Total Protein: 7.4 g/dL (ref 6.5–8.1)

## 2018-06-15 LAB — CBC WITH DIFFERENTIAL (CANCER CENTER ONLY)
Abs Immature Granulocytes: 0.09 10*3/uL — ABNORMAL HIGH (ref 0.00–0.07)
BASOS ABS: 0.1 10*3/uL (ref 0.0–0.1)
Basophils Relative: 1 %
EOS ABS: 0.1 10*3/uL (ref 0.0–0.5)
Eosinophils Relative: 1 %
HEMATOCRIT: 37.8 % (ref 36.0–46.0)
Hemoglobin: 12.7 g/dL (ref 12.0–15.0)
IMMATURE GRANULOCYTES: 1 %
LYMPHS ABS: 1.4 10*3/uL (ref 0.7–4.0)
LYMPHS PCT: 12 %
MCH: 30.8 pg (ref 26.0–34.0)
MCHC: 33.6 g/dL (ref 30.0–36.0)
MCV: 91.5 fL (ref 80.0–100.0)
Monocytes Absolute: 0.6 10*3/uL (ref 0.1–1.0)
Monocytes Relative: 5 %
NEUTROS ABS: 9.2 10*3/uL — AB (ref 1.7–7.7)
NEUTROS PCT: 80 %
NRBC: 0 % (ref 0.0–0.2)
Platelet Count: 326 10*3/uL (ref 150–400)
RBC: 4.13 MIL/uL (ref 3.87–5.11)
RDW: 14.3 % (ref 11.5–15.5)
WBC Count: 11.4 10*3/uL — ABNORMAL HIGH (ref 4.0–10.5)

## 2018-06-15 MED ORDER — SODIUM CHLORIDE 0.9% FLUSH
10.0000 mL | Freq: Once | INTRAVENOUS | Status: AC
  Administered 2018-06-15: 10 mL via INTRAVENOUS
  Filled 2018-06-15: qty 10

## 2018-06-15 MED ORDER — SODIUM CHLORIDE 0.9% FLUSH
10.0000 mL | Freq: Once | INTRAVENOUS | Status: AC
  Administered 2018-06-15: 10 mL
  Filled 2018-06-15: qty 10

## 2018-06-15 MED ORDER — HEPARIN SOD (PORK) LOCK FLUSH 100 UNIT/ML IV SOLN
500.0000 [IU] | Freq: Once | INTRAVENOUS | Status: AC
  Administered 2018-06-15: 500 [IU] via INTRAVENOUS
  Filled 2018-06-15: qty 5

## 2018-06-15 NOTE — Progress Notes (Unsigned)
Per Dr.Gudena, hold treatment Abraxane for ALT 121. Sent scheduling message to reschedule pt for lab/fl/md/infusion early next week. Informed infusion RN

## 2018-06-15 NOTE — Telephone Encounter (Signed)
Scheduled appt per 11/22 sch message - pt is aware of apt date and time

## 2018-06-18 ENCOUNTER — Other Ambulatory Visit: Payer: Self-pay

## 2018-06-18 ENCOUNTER — Encounter: Payer: Self-pay | Admitting: *Deleted

## 2018-06-18 ENCOUNTER — Inpatient Hospital Stay: Payer: Commercial Managed Care - PPO

## 2018-06-18 ENCOUNTER — Inpatient Hospital Stay (HOSPITAL_BASED_OUTPATIENT_CLINIC_OR_DEPARTMENT_OTHER): Payer: Commercial Managed Care - PPO | Admitting: Hematology and Oncology

## 2018-06-18 VITALS — HR 101

## 2018-06-18 DIAGNOSIS — Z170 Estrogen receptor positive status [ER+]: Secondary | ICD-10-CM

## 2018-06-18 DIAGNOSIS — Z95828 Presence of other vascular implants and grafts: Secondary | ICD-10-CM

## 2018-06-18 DIAGNOSIS — C50412 Malignant neoplasm of upper-outer quadrant of left female breast: Secondary | ICD-10-CM

## 2018-06-18 DIAGNOSIS — C773 Secondary and unspecified malignant neoplasm of axilla and upper limb lymph nodes: Secondary | ICD-10-CM | POA: Diagnosis not present

## 2018-06-18 DIAGNOSIS — R7989 Other specified abnormal findings of blood chemistry: Secondary | ICD-10-CM | POA: Diagnosis not present

## 2018-06-18 DIAGNOSIS — Z5111 Encounter for antineoplastic chemotherapy: Secondary | ICD-10-CM | POA: Diagnosis not present

## 2018-06-18 DIAGNOSIS — T451X5A Adverse effect of antineoplastic and immunosuppressive drugs, initial encounter: Secondary | ICD-10-CM

## 2018-06-18 DIAGNOSIS — D701 Agranulocytosis secondary to cancer chemotherapy: Secondary | ICD-10-CM

## 2018-06-18 LAB — CMP (CANCER CENTER ONLY)
ALT: 93 U/L — ABNORMAL HIGH (ref 0–44)
ANION GAP: 9 (ref 5–15)
AST: 33 U/L (ref 15–41)
Albumin: 4.3 g/dL (ref 3.5–5.0)
Alkaline Phosphatase: 79 U/L (ref 38–126)
BUN: 11 mg/dL (ref 6–20)
CHLORIDE: 106 mmol/L (ref 98–111)
CO2: 27 mmol/L (ref 22–32)
Calcium: 9.6 mg/dL (ref 8.9–10.3)
Creatinine: 0.76 mg/dL (ref 0.44–1.00)
GFR, Est AFR Am: >60 mL/min (ref >60)
GFR, Estimated: >60 mL/min (ref >60)
Glucose, Bld: 103 mg/dL — ABNORMAL HIGH (ref 70–99)
POTASSIUM: 3.7 mmol/L (ref 3.5–5.1)
SODIUM: 142 mmol/L (ref 135–145)
Total Bilirubin: 0.3 mg/dL (ref 0.3–1.2)
Total Protein: 7.1 g/dL (ref 6.5–8.1)

## 2018-06-18 LAB — CBC WITH DIFFERENTIAL (CANCER CENTER ONLY)
Abs Immature Granulocytes: 0.18 10*3/uL — ABNORMAL HIGH (ref 0.00–0.07)
BASOS ABS: 0.1 10*3/uL (ref 0.0–0.1)
Basophils Relative: 1 %
EOS ABS: 0.2 10*3/uL (ref 0.0–0.5)
Eosinophils Relative: 5 %
HCT: 39.3 % (ref 36.0–46.0)
Hemoglobin: 12.9 g/dL (ref 12.0–15.0)
IMMATURE GRANULOCYTES: 4 %
LYMPHS ABS: 1.3 10*3/uL (ref 0.7–4.0)
LYMPHS PCT: 25 %
MCH: 30.3 pg (ref 26.0–34.0)
MCHC: 32.8 g/dL (ref 30.0–36.0)
MCV: 92.3 fL (ref 80.0–100.0)
Monocytes Absolute: 0.4 10*3/uL (ref 0.1–1.0)
Monocytes Relative: 7 %
NEUTROS PCT: 58 %
NRBC: 0 % (ref 0.0–0.2)
Neutro Abs: 2.9 10*3/uL (ref 1.7–7.7)
PLATELETS: 358 10*3/uL (ref 150–400)
RBC: 4.26 MIL/uL (ref 3.87–5.11)
RDW: 14.5 % (ref 11.5–15.5)
WBC Count: 5 10*3/uL (ref 4.0–10.5)

## 2018-06-18 MED ORDER — HEPARIN SOD (PORK) LOCK FLUSH 100 UNIT/ML IV SOLN
500.0000 [IU] | Freq: Once | INTRAVENOUS | Status: AC | PRN
  Administered 2018-06-18: 500 [IU]
  Filled 2018-06-18: qty 5

## 2018-06-18 MED ORDER — PROCHLORPERAZINE MALEATE 10 MG PO TABS
10.0000 mg | ORAL_TABLET | Freq: Once | ORAL | Status: AC
  Administered 2018-06-18: 10 mg via ORAL

## 2018-06-18 MED ORDER — SODIUM CHLORIDE 0.9 % IV SOLN
Freq: Once | INTRAVENOUS | Status: AC
  Administered 2018-06-18: 14:00:00 via INTRAVENOUS
  Filled 2018-06-18: qty 250

## 2018-06-18 MED ORDER — PROCHLORPERAZINE MALEATE 10 MG PO TABS
ORAL_TABLET | ORAL | Status: AC
  Filled 2018-06-18: qty 1

## 2018-06-18 MED ORDER — SODIUM CHLORIDE 0.9% FLUSH
10.0000 mL | Freq: Once | INTRAVENOUS | Status: AC
  Administered 2018-06-18: 10 mL
  Filled 2018-06-18: qty 10

## 2018-06-18 MED ORDER — SODIUM CHLORIDE 0.9% FLUSH
10.0000 mL | INTRAVENOUS | Status: DC | PRN
  Administered 2018-06-18: 10 mL
  Filled 2018-06-18: qty 10

## 2018-06-18 MED ORDER — PACLITAXEL PROTEIN-BOUND CHEMO INJECTION 100 MG
80.0000 mg/m2 | Freq: Once | INTRAVENOUS | Status: AC
  Administered 2018-06-18: 150 mg via INTRAVENOUS
  Filled 2018-06-18: qty 30

## 2018-06-18 NOTE — Assessment & Plan Note (Signed)
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  Recommendation: 1. Neoadjuvant chemotherapy with dose dense Adriamycin and Cytoxan followed by Taxol weekly x12, switched to Abraxane after first cycle because of Taxol reaction causing severe palpitations 2. Followed by surgery (which could be mastectomy) withAXLND versusTAD 3. Followed by radiation 4. Followed by antiestrogen therapy ------------------------------------------------------------------ Breast MRI 06/13/2018 significant improvement of left breast cancer previously 2.4 x 3.2 x 4 cm, currently 1.4 x 1.3 x 0.8 cm, no lymph nodes  Radiology review: Wound excellent response to chemotherapy, like to discontinue the last cycle treatment and she can proceed with surgery.  Follow-up after surgery

## 2018-06-18 NOTE — Progress Notes (Signed)
Ok to treat with abnormal alt 93. Infusion RN aware.

## 2018-06-18 NOTE — Progress Notes (Signed)
Patient Care Team: Blair Heys, PA-C as PCP - General (Physician Assistant) Nicholas Lose, MD as Consulting Physician (Hematology and Oncology) Rolm Bookbinder, MD as Consulting Physician (General Surgery) Delice Bison [name], NP as Nurse Practitioner (Hematology and Oncology)  DIAGNOSIS:  Encounter Diagnosis  Name Primary?  . Malignant neoplasm of upper-outer quadrant of left breast in female, estrogen receptor positive ([place])     SUMMARY OF ONCOLOGIC HISTORY:   Malignant neoplasm of upper-outer quadrant of left breast in female, estrogen receptor positive ([place])   01/02/2018 Initial Diagnosis    Palpable lump left breast, mammogram: Left breast UOQ 2.3 cm mass, axilla has several enlarged lymph nodes; 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    01/04/2018 Cancer Staging    Staging form: Breast, AJCC 8th Edition - Clinical: Stage IIB (cT2, cN1, cM0, G3, ER+, PR+, HER2: Equivocal) - Signed by Nicholas Lose, MD on 01/04/2018    01/16/2018 -  Neo-Adjuvant Chemotherapy    Dose dense Adriamycin and Cytoxan x4 followed by Taxol, switched to Abraxane with cycle 2 after Taxol reaction with palpitations weekly x12    01/19/2018 Genetic Testing    STK11 c.608C>T VUS identified on the Multicancer panel.  The Multi-Gene Panel offered by [facility] includes sequencing and/or deletion duplication testing of the following 83 genes: ALK, APC, ATM, AXIN2,BAP1,  BARD1, BLM, BMPR1A, BRCA1, BRCA2, BRIP1, CASR, CDC73, CDH1, CDK4, CDKN1B, CDKN1C, CDKN2A (p14ARF), CDKN2A (p16INK4a), CEBPA, CHEK2, CTNNA1, DICER1, DIS3L2, EGFR (c.2369C>T, p.Thr790Met variant only), EPCAM (Deletion/duplication testing only), FH, FLCN, GATA2, GPC3, GREM1 (Promoter region deletion/duplication testing only), HOXB13 (c.251G>A, p.Gly84Glu), HRAS, KIT, MAX, MEN1, MET, MITF (c.952G>A, p.Glu318Lys variant only), MLH1, MSH2, MSH3,  MSH6, MUTYH, NBN, NF1, NF2, NTHL1, PALB2, PDGFRA, PHOX2B, PMS2, POLD1, POLE, POT1, PRKAR1A, PTCH1, PTEN, RAD50, RAD51C, RAD51D, RB1, RECQL4, RET, RUNX1, SDHAF2, SDHA (sequence changes only), SDHB, SDHC, SDHD, SMAD4, SMARCA4, SMARCB1, SMARCE1, STK11, SUFU, TERT, TERT, TMEM127, TP53, TSC1, TSC2, VHL, WRN and WT1.  The report date is January 19, 2018.    06/13/2018 Breast MRI    Significant improvement of left breast cancer previously 2.4 x 3.2 x 4 cm, currently 1.4 x 1.3 x 0.8 cm, no lymph nodes     CHIEF COMPLIANT: Follow-up to discuss breast MRI results and to receive her last cycle of chemo  INTERVAL HISTORY: Kristi [name] is a 42 year old with above-mentioned history of left breast cancer who received neoadjuvant chemotherapy and had breast MRI.  She could not receive her second follow-up treatment because of elevation of LFTs.  She is here today to see if her labs are better so she can receive her final treatment.  She does not have any significant neuropathy.  She occasionally feels tingling in the fingers that goes away.  REVIEW OF SYSTEMS:   Constitutional: Denies fevers, chills or abnormal weight loss Eyes: Denies blurriness of vision Ears, nose, mouth, throat, and face: Denies mucositis or sore throat Respiratory: Denies cough, dyspnea or wheezes Cardiovascular: Denies palpitation, chest discomfort Gastrointestinal:  Denies nausea, heartburn or change in bowel habits Skin: Denies abnormal skin rashes Lymphatics: Denies new lymphadenopathy or easy bruising Neurological: Mild tingling and numbness of fingers intermittently Behavioral/Psych: Mood is stable, no new changes  Extremities: No lower extremity edema  All other systems were reviewed with the patient and are negative.  I have reviewed the past medical history, past surgical history, social history and family history with the patient  and they are unchanged from previous note.  ALLERGIES:  is allergic to sulfa  antibiotics.  MEDICATIONS:  Current Outpatient Medications  Medication Sig Dispense Refill  . baclofen (LIORESAL) 10 MG tablet Take 10 mg by mouth as needed.    . Biotin 3 MG TABS Take 1 tablet by mouth. She is not sure of dosage amount    . buPROPion (WELLBUTRIN) 75 MG tablet Take 75 mg by mouth 2 (two) times daily.    . Calcium Carbonate-Vitamin D3 (CALCIUM 600-D) 600-400 MG-UNIT TABS     . EMGALITY 120 MG/ML SOAJ 120 mg every 30 (thirty) days.    Marland Kitchen gabapentin (NEURONTIN) 300 MG capsule Take 1 capsule (300 mg total) by mouth at bedtime. 90 capsule 3  . LORazepam (ATIVAN) 0.5 MG tablet Take 1 tablet (0.5 mg total) by mouth every 8 (eight) hours as needed for anxiety. 30 tablet 1  . Magnesium 500 MG CAPS Take 500 mg by mouth 2 (two) times daily.    . Multiple Vitamins-Minerals (MULTIPLE VITAMINS/WOMENS PO)     . riboflavin (VITAMIN B-2) 100 MG TABS tablet Take 400 mg by mouth 2 (two) times daily.    . rizatriptan (MAXALT) 10 MG tablet Take 10 mg by mouth as needed for migraine. May repeat in 2 hours if needed    . traMADol (ULTRAM) 50 MG tablet Take 1 tablet (50 mg total) by mouth every 6 (six) hours as needed. 10 tablet 0   No current facility-administered medications for this visit.    Facility-Administered Medications Ordered in Other Visits  Medication Dose Route Frequency Provider Last Rate Last Dose  . sodium chloride flush (NS) 0.9 % injection 10 mL  10 mL Intravenous PRN Nicholas Lose, MD   10 mL at 04/13/18 1529    PHYSICAL EXAMINATION: ECOG PERFORMANCE STATUS: 1 - Symptomatic but completely ambulatory  Vitals:   06/18/18 1321  BP: (!) 130/94  Pulse: (!) 106  Resp: 17  Temp: (!) 97.2 F (36.2 C)  SpO2: 100%   Filed Weights   06/18/18 1321  Weight: 149 lb 8 oz (67.8 kg)    GENERAL:alert, no distress and comfortable SKIN: skin color, texture, turgor are normal, no rashes or significant lesions EYES: normal, Conjunctiva are pink and non-injected, sclera  clear OROPHARYNX:no exudate, no erythema and lips, buccal mucosa, and tongue normal  NECK: supple, thyroid normal size, non-tender, without nodularity LYMPH:  no palpable lymphadenopathy in the cervical, axillary or inguinal LUNGS: clear to auscultation and percussion with normal breathing effort HEART: regular rate & rhythm and no murmurs and no lower extremity edema ABDOMEN:abdomen soft, non-tender and normal bowel sounds MUSCULOSKELETAL:no cyanosis of digits and no clubbing  NEURO: alert & oriented x 3 with fluent speech, no focal motor/sensory deficits EXTREMITIES: No lower extremity edema   LABORATORY DATA:  I have reviewed the data as listed CMP Latest Ref Rng & Units 06/15/2018 06/08/2018 06/01/2018  Glucose 70 - 99 mg/dL 82 99 86  BUN 6 - 20 mg/dL _0 Creatinine 0.44 - 1.00 mg/dL 0.81 0.77 0.74  Sodium 135 - 145 mmol/L 141 140 141  Potassium 3.5 - 5.1 mmol/L 4.0 3.8 4.1  Chloride 98 - 111 mmol/L 106 105 108  CO2 22 - 32 mmol/L _1 Calcium 8.9 - 10.3 mg/dL 9.7 9.9 9.5  Total Protein 6.5 - 8.1 g/dL 7.4 7.1 7.1  Total Bilirubin 0.3 - 1.2 mg/dL 0.3 0.2(L) 0.2(L)  Alkaline Phos 38 - 126 U/L 89 83  77  AST 15 - 41 U/L 47(H) 30 35  ALT 0 - 44 U/L 121(H) 73(H) 73(H)    Lab Results  Component Value Date   WBC 5.0 06/18/2018   HGB 12.9 06/18/2018   HCT 39.3 06/18/2018   MCV 92.3 06/18/2018   PLT 358 06/18/2018   NEUTROABS 2.9 06/18/2018    ASSESSMENT & PLAN:  Malignant neoplasm of upper-outer quadrant of left breast in female, estrogen receptor positive ([place]) 01/02/2018: Palpable lump left breastsince December 2018, mammogram: Left breast UOQ 2.3 cm mass, axilla has several enlarged lymph nodes; 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  Recommendation: 1. Neoadjuvant chemotherapy with dose dense Adriamycin and Cytoxan followed by Taxol weekly x12, switched to  Abraxane after first cycle because of Taxol reaction causing severe palpitations 2. Followed by surgery (which could be mastectomy) withAXLND versusTAD 3. Followed by radiation 4. Followed by antiestrogen therapy ------------------------------------------------------------------ Breast MRI 06/13/2018 significant improvement of left breast cancer previously 2.4 x 3.2 x 4 cm, currently 1.4 x 1.3 x 0.8 cm, no lymph nodes  If the LFTs have come down then we will give her the last and final treatment.  If not then we will not plan on giving her the last dose.  Radiology review: Excellent response to neoadjuvant chemotherapy. We will discuss her case in the breast tumor board and determine if she would be able to undergo targeted node dissection. Follow-up after surgery to discuss pathology report.  No orders of the defined types were placed in this encounter.  The patient has a good understanding of the overall plan. she agrees with it. she will call with any problems that may develop before the next visit here.   Harriette Ohara, MD 06/18/18

## 2018-06-18 NOTE — Patient Instructions (Signed)
[facility] Discharge Instructions for Patients Receiving Chemotherapy  Today you received the following chemotherapy agents:  Abnaxane  To help prevent nausea and vomiting after your treatment, we encourage you to take your nausea medication as prescribed.   If you develop nausea and vomiting that is not controlled by your nausea medication, call the clinic.   BELOW ARE SYMPTOMS THAT SHOULD BE REPORTED IMMEDIATELY:  *FEVER GREATER THAN 100.5 F  *CHILLS WITH OR WITHOUT FEVER  NAUSEA AND VOMITING THAT IS NOT CONTROLLED WITH YOUR NAUSEA MEDICATION  *UNUSUAL SHORTNESS OF BREATH  *UNUSUAL BRUISING OR BLEEDING  TENDERNESS IN MOUTH AND THROAT WITH OR WITHOUT PRESENCE OF ULCERS  *URINARY PROBLEMS  *BOWEL PROBLEMS  UNUSUAL RASH Items with * indicate a potential emergency and should be followed up as soon as possible.  Feel free to call the clinic should you have any questions or concerns. The clinic phone number is [phone].  Please show the [place] at check-in to the Emergency Department and triage nurse.

## 2018-06-18 NOTE — Progress Notes (Signed)
Per nurse, per Dr Lindi Adie OK for Abraxane with current heart rate.

## 2018-06-19 ENCOUNTER — Other Ambulatory Visit: Payer: Self-pay

## 2018-06-19 ENCOUNTER — Encounter (HOSPITAL_BASED_OUTPATIENT_CLINIC_OR_DEPARTMENT_OTHER): Payer: Self-pay | Admitting: *Deleted

## 2018-06-19 NOTE — Pre-Procedure Instructions (Signed)
To come pick up Ensure pre-surgery drink 10 oz. - to drink by 0400 DOS; to pick up CHG soap to use in shower night before surgery and AM DOS.

## 2018-06-20 ENCOUNTER — Ambulatory Visit: Payer: Commercial Managed Care - PPO

## 2018-06-20 ENCOUNTER — Telehealth: Payer: Self-pay | Admitting: Hematology and Oncology

## 2018-06-20 NOTE — Telephone Encounter (Signed)
Called regarding 11/30

## 2018-06-20 NOTE — Progress Notes (Signed)
Location of Breast Cancer: Left Breast  Histology per Pathology Report:  01/02/18 Diagnosis 1. 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 - SEE COMMENT. 2. Lymph node, needle/core biopsy, left axilla - INVASIVE DUCTAL CARCINOMA.  Receptor Status: ER(75%), PR (70%), Her2-neu (NEG), Ki-(60%)  Did patient present with symptoms or was this found on screening mammography?: She originally felt a lump December 2018. It was felt to be benign. The lump increased in size and she had a mammogram performed.   Past/Anticipated interventions by surgeon, if any: -01/15/18 PAC placed by Dr. Donne Hazel   -07/02/18 Scheduled for bilateral nipple sparing mastectomy with radioactive seed targeted left axillary lymph node excision and left axillary sentinal lymph node biopsy. Performed by Dr. Donne Hazel and Dr. Iran Planas    Past/Anticipated interventions by medical oncology, if any: 06/18/18 Dr. Lindi Adie Recommendation: 1. Neoadjuvant chemotherapy with dose dense Adriamycin and Cytoxan followed by Taxol weekly x12, switched to Abraxane after first cycle because of Taxol reaction causing severe palpitations 2. Followed by surgery (which could be mastectomy) withAXLND versusTAD  ** surgery scheduled for 07/02/18 3. Followed by radiation 4. Followed by antiestrogen therapy  Lymphedema issues, if any:  N/A  Pain issues, if any: No  SAFETY ISSUES:  Prior radiation? No  Pacemaker/ICD? No  Possible current pregnancy? 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   Is the patient on methotrexate? No  Current Complaints / other details:      BP 138/85 (BP Location: Right Arm, Patient Position: Sitting)   Pulse 97   Temp 97.7 F (36.5 C) (Oral)   Resp 20   Ht 5' 5"  (1.651 m)   Wt 148 lb 12.8 oz (67.5 kg)   SpO2 100%   BMI 24.76 kg/m    Wt Readings from Last 3 Encounters:  06/27/18 148 lb 12.8 oz  (67.5 kg)  06/18/18 149 lb 8 oz (67.8 kg)  06/08/18 148 lb 8 oz (67.4 kg)   Kristi [name], Stephani Police, RN    06/20/2018,1:21 PM

## 2018-06-22 ENCOUNTER — Other Ambulatory Visit: Payer: Commercial Managed Care - PPO

## 2018-06-22 ENCOUNTER — Ambulatory Visit: Payer: Commercial Managed Care - PPO

## 2018-06-23 ENCOUNTER — Inpatient Hospital Stay: Payer: Commercial Managed Care - PPO

## 2018-06-23 VITALS — BP 136/94 | HR 103 | Temp 98.2°F | Resp 17

## 2018-06-23 DIAGNOSIS — Z170 Estrogen receptor positive status [ER+]: Secondary | ICD-10-CM

## 2018-06-23 DIAGNOSIS — C50412 Malignant neoplasm of upper-outer quadrant of left female breast: Secondary | ICD-10-CM

## 2018-06-23 DIAGNOSIS — Z5111 Encounter for antineoplastic chemotherapy: Secondary | ICD-10-CM | POA: Diagnosis not present

## 2018-06-23 DIAGNOSIS — T451X5A Adverse effect of antineoplastic and immunosuppressive drugs, initial encounter: Secondary | ICD-10-CM

## 2018-06-23 DIAGNOSIS — D701 Agranulocytosis secondary to cancer chemotherapy: Secondary | ICD-10-CM

## 2018-06-23 MED ORDER — TBO-FILGRASTIM 480 MCG/0.8ML ~~LOC~~ SOSY
PREFILLED_SYRINGE | SUBCUTANEOUS | Status: AC
  Filled 2018-06-23: qty 0.8

## 2018-06-23 MED ORDER — TBO-FILGRASTIM 480 MCG/0.8ML ~~LOC~~ SOSY
480.0000 ug | PREFILLED_SYRINGE | Freq: Once | SUBCUTANEOUS | Status: AC
  Administered 2018-06-23: 480 ug via SUBCUTANEOUS

## 2018-06-23 NOTE — Patient Instructions (Signed)
Tbo-Filgrastim injection What is this medicine? TBO-FILGRASTIM (T B O fil GRA stim) is a granulocyte colony-stimulating factor that stimulates the growth of neutrophils, a type of white blood cell important in the body's fight against infection. It is used to reduce the incidence of fever and infection in patients with certain types of cancer who are receiving chemotherapy that affects the bone marrow. This medicine may be used for other purposes; ask your health care provider or pharmacist if you have questions. COMMON BRAND NAME(S): Granix What should I tell my health care provider before I take this medicine? They need to know if you have any of these conditions: -bone scan or tests planned -kidney disease -sickle cell anemia -an unusual or allergic reaction to tbo-filgrastim, filgrastim, pegfilgrastim, other medicines, foods, dyes, or preservatives -pregnant or trying to get pregnant -breast-feeding How should I use this medicine? This medicine is for injection under the skin. If you get this medicine at home, you will be taught how to prepare and give this medicine. Refer to the Instructions for Use that come with your medication packaging. Use exactly as directed. Take your medicine at regular intervals. Do not take your medicine more often than directed. It is important that you put your used needles and syringes in a special sharps container. Do not put them in a trash can. If you do not have a sharps container, call your pharmacist or healthcare provider to get one. Talk to your pediatrician regarding the use of this medicine in children. Special care may be needed. Overdosage: If you think you have taken too much of this medicine contact a poison control center or emergency room at once. NOTE: This medicine is only for you. Do not share this medicine with others. What if I miss a dose? It is important not to miss your dose. Call your doctor or health care professional if you miss a  dose. What may interact with this medicine? This medicine may interact with the following medications: -medicines that may cause a release of neutrophils, such as lithium This list may not describe all possible interactions. Give your health care provider a list of all the medicines, herbs, non-prescription drugs, or dietary supplements you use. Also tell them if you smoke, drink alcohol, or use illegal drugs. Some items may interact with your medicine. What should I watch for while using this medicine? You may need blood work done while you are taking this medicine. What side effects may I notice from receiving this medicine? Side effects that you should report to your doctor or health care professional as soon as possible: -allergic reactions like skin rash, itching or hives, swelling of the face, lips, or tongue -blood in the urine -dark urine -dizziness -fast heartbeat -feeling faint -shortness of breath or breathing problems -signs and symptoms of infection like fever or chills; cough; or sore throat -signs and symptoms of kidney injury like trouble passing urine or change in the amount of urine -stomach or side pain, or pain at the shoulder -sweating -swelling of the legs, ankles, or abdomen -tiredness Side effects that usually do not require medical attention (report to your doctor or health care professional if they continue or are bothersome): -bone pain -headache -muscle pain -vomiting This list may not describe all possible side effects. Call your doctor for medical advice about side effects. You may report side effects to FDA at 1-800-FDA-1088. Where should I keep my medicine? Keep out of the reach of children. Store in a refrigerator between   2 and 8 degrees C (36 and 46 degrees F). Keep in carton to protect from light. Throw away this medicine if it is left out of the refrigerator for more than 5 consecutive days. Throw away any unused medicine after the expiration  date. NOTE: This sheet is a summary. It may not cover all possible information. If you have questions about this medicine, talk to your doctor, pharmacist, or health care provider.  2018 Elsevier/Gold Standard (2015-08-31 19:07:04)  

## 2018-06-25 ENCOUNTER — Other Ambulatory Visit: Payer: Self-pay | Admitting: Hematology and Oncology

## 2018-06-25 ENCOUNTER — Telehealth: Payer: Self-pay

## 2018-06-25 MED ORDER — AZITHROMYCIN 250 MG PO TABS: ORAL_TABLET | ORAL | 0 refills | Status: DC

## 2018-06-25 NOTE — Telephone Encounter (Signed)
Returned patient's call.  Patient c/o cough and congestion X 2 days.  Denies any fever.  Patient was around sick family members over the weekend.  Has upcoming surgery and wants to prevent any delays from sickness.    Nurse collaborated with Sandi Mealy, PA-C.  Verbal orders given for Z-pak.  Patient aware and voiced understanding.   Patient aware to call with any changes of fever, or worsening symptoms.

## 2018-06-26 NOTE — Progress Notes (Signed)
Radiation Oncology         [phone] ________________________________  Initial Outpatient Consultation  Name: Kristi [name] MRN: [id number]  Date: 06/27/2018  DOB: Apr 18, 1976  GD:JMEQ, Izetta Dakin, MD   REFERRING PHYSICIAN: Nicholas Lose, MD  DIAGNOSIS:    ICD-10-CM   1. Malignant neoplasm of upper-outer quadrant of left breast in female, estrogen receptor positive ([place]) C50.412    Z17.0   Cancer Staging Malignant neoplasm of upper-outer quadrant of left breast in female, estrogen receptor positive ([place]) Staging form: Breast, AJCC 8th Edition - Clinical: Stage IIB (cT2, cN1, cM0, G3, ER+, PR+, HER2: Equivocal) - Signed by Nicholas Lose, MD on 01/04/2018  CHIEF COMPLAINT: Here to discuss management of left breast cancer  HISTORY OF PRESENT ILLNESS::Kristi [name] is a 42 y.o. female who presented with a self-palpated left breast lump in December 2018 that was felt to be benign. She continued to notice it increase in size and was evaluated with mammogram and ultrasound on 12/27/17 which showed a 2.3 cm solid mass in the UOQ of the left breast with enlarged left axillary lymph nodes. Biopsy of the left breast mass on date of 01/02/18 showed invasive mammary carcinoma with perineural invasion identified; ER positive, PR positive, Her2 negative, Grade 3. 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  Bilateral breast MRI on 01/10/18 showed the known left breast cancer with four abnormal lymph nodes in the left axilla.  Initial staging studies on 01/12/18 include CT C/A/P and bone scan. CT C/A/P showed the left breast primary and axillary nodal metastasis. There were multiple hepatic cysts and a new left hepatic lobe lesion (too small to characterize), otherwise no evidence of distant metastasis. Bone scan showed probable degenerative disc disease change at T11-T12 and a single focus of abnormal increased tracer localization at the anterior right  first rib at or near the costochondral junction, nonspecific. No additional scintigraphic abnormalities.  She received neoadjuvant chemotherapy from 01/16/18 to 06/18/18 with dose dense Adriamycin and Cytoxan x4 followed by Taxol, switched to Abraxane, weekly x12.  Follow-up MRI of the bilateral breasts on 06/13/18 showed significantly improved appearance of known left breast cancer following neoadjuvant treatment. Non mass enhancement measures maximum of 1.4 cm, previously 2.4 cm. Interval resolution of left axillary adenopathy. No new suspicious findings.  She is scheduled for bilateral nipple sparing mastectomies with left axillary lymph node excision and SLN biopsy on 07/02/18. She will undergo bilateral breast reconstruction at the same time.  Genetic testing: STK11 c.608C>T VUS identified  PREVIOUS RADIATION THERAPY: No  PAST MEDICAL HISTORY:  has a past medical history of Breast cancer, left ([place]) (12/2017), Endometriosis, Family history of breast cancer, Family history of ovarian cancer, Family history of pancreatic cancer, Family history of prostate cancer, History of chemotherapy, Migraines, and PONV (postoperative nausea and vomiting).    PAST SURGICAL HISTORY: Past Surgical History:  Procedure Laterality Date  . NASAL POLYP EXCISION    . OVARIAN CYST REMOVAL    . PORTACATH PLACEMENT Right 01/15/2018   Procedure: INSERTION PORT-A-CATH WITH [place];  Surgeon: Rolm Bookbinder, MD;  Location: [facility];  Service: General;  Laterality: Right;  . WISDOM TOOTH EXTRACTION      FAMILY HISTORY: family history includes Breast cancer in her other; Colon cancer (age of onset: 33) in her maternal grandfather; Heart attack in her paternal grandmother; Ovarian cancer (age of onset: 17) in her maternal aunt; Pancreatic cancer (age of onset: 45) in her maternal grandmother; Prostate  cancer in her paternal grandfather; Prostate cancer (age of onset: 36) in her father and maternal  uncle.  SOCIAL HISTORY:  reports that she has never smoked. She has never used smokeless tobacco. She reports that she drinks alcohol. She reports that she does not use drugs.  ALLERGIES: Sulfa antibiotics  MEDICATIONS:  Current Outpatient Medications  Medication Sig Dispense Refill  . azithromycin (ZITHROMAX Z-PAK) 250 MG tablet Take 2 tablets on day one, then 1 Tablet X 4 days. 6 each 0  . baclofen (LIORESAL) 10 MG tablet Take 10 mg by mouth 2 (two) times daily.     Marland Kitchen buPROPion (WELLBUTRIN) 75 MG tablet Take 75 mg by mouth 2 (two) times daily.    . Calcium Carbonate-Vitamin D3 (CALCIUM 600-D) 600-400 MG-UNIT TABS     . eletriptan (RELPAX) 40 MG tablet TAKE ONE TABLET BY MOUTH AT ONSET OF MIGRAINE. IF SYMPTOMS PERSIST A SECOND DOSE MAY BE TAKEN IN 2 HOURS. DO NOT EXCEED 2 DOSES IN A 24 HOUR  3  . EMGALITY 120 MG/ML SOAJ 120 mg every 30 (thirty) days.    Marland Kitchen gabapentin (NEURONTIN) 300 MG capsule Take 1 capsule (300 mg total) by mouth at bedtime. 90 capsule 3  . Goserelin Acetate (ZOLADEX [place]) Inject into the skin.    Marland Kitchen LORazepam (ATIVAN) 0.5 MG tablet TAKE 1 TABLET BY MOUTH EVERY 8 HOURS AS NEEDED FOR ANXIETY 30 tablet 1  . Magnesium 500 MG CAPS Take 500 mg by mouth 2 (two) times daily.    . Multiple Vitamins-Minerals (HAIR SKIN AND NAILS FORMULA PO) Take by mouth.    . ondansetron (ZOFRAN) 8 MG tablet TAKE 1 TABLET BY MOUTH TWICE DAILY AS NEEDED START ON THE 3RD DAY AFTER CHEMOTHERAPY    . riboflavin (VITAMIN B-2) 100 MG TABS tablet Take 400 mg by mouth 2 (two) times daily.     No current facility-administered medications for this encounter.    Facility-Administered Medications Ordered in Other Encounters  Medication Dose Route Frequency Provider Last Rate Last Dose  . sodium chloride flush (NS) 0.9 % injection 10 mL  10 mL Intravenous PRN Nicholas Lose, MD   10 mL at 04/13/18 1529    REVIEW OF SYSTEMS: A 10+ POINT REVIEW OF SYSTEMS WAS OBTAINED including neurology, dermatology,  psychiatry, cardiac, respiratory, lymph, extremities, GI, GU, Musculoskeletal, constitutional, breasts, reproductive, HEENT.  All pertinent positives are noted in the HPI.  All others are negative.   PHYSICAL EXAM:  height is _0  (1.651 m) and weight is 148 lb 12.8 oz (67.5 kg). Her oral temperature is 97.7 F (36.5 C). Her blood pressure is 138/85 and her pulse is 97. Her respiration is 20 and oxygen saturation is 100%.   General: Alert and oriented, in no acute distress. HEENT: Head is normocephalic. Extraocular movements are intact. Oropharynx is clear. Neck: Neck is supple, no palpable cervical or supraclavicular lymphadenopathy. Heart: Regular in rate and rhythm with no murmurs, rubs, or gallops. Chest: Clear to auscultation bilaterally, with no rhonchi, wheezes, or rales. Abdomen: Soft, nontender, nondistended, with no rigidity or guarding. Extremities: No cyanosis or edema. Lymphatics: see Neck Exam Skin: No concerning lesions. Musculoskeletal: Symmetric strength and muscle tone throughout. Neurologic: Cranial nerves II through XII are grossly intact. No obvious focalities. Speech is fluent. Coordination is intact. Psychiatric: Judgment and insight are intact. Affect is appropriate. Breasts: No palpable masses appreciated in the breasts or axillae bilaterally.   ECOG = 0  0 - Asymptomatic (Fully active, able to carry  on all predisease activities without restriction)  1 - Symptomatic but completely ambulatory (Restricted in physically strenuous activity but ambulatory and able to carry out work of a light or sedentary nature. For example, light housework, office work)  2 - Symptomatic, <50% in bed during the day (Ambulatory and capable of all self care but unable to carry out any work activities. Up and about more than 50% of waking hours)  3 - Symptomatic, >50% in bed, but not bedbound (Capable of only limited self-care, confined to bed or chair 50% or more of waking hours)  4 -  Bedbound (Completely disabled. Cannot carry on any self-care. Totally confined to bed or chair)  5 - Death   Eustace Pen MM, Creech RH, Tormey DC, et al. [id number]). "Toxicity and response criteria of the [facility]". [place] Oncol. 5 (6): 649-55   LABORATORY DATA:  Lab Results  Component Value Date   WBC 5.0 06/18/2018   HGB 12.9 06/18/2018   HCT 39.3 06/18/2018   MCV 92.3 06/18/2018   PLT 358 06/18/2018   CMP     Component Value Date/Time   NA 142 06/18/2018 1236   K 3.7 06/18/2018 1236   CL 106 06/18/2018 1236   CO2 27 06/18/2018 1236   GLUCOSE 103 (H) 06/18/2018 1236   BUN 11 06/18/2018 1236   CREATININE 0.76 06/18/2018 1236   CALCIUM 9.6 06/18/2018 1236   PROT 7.1 06/18/2018 1236   ALBUMIN 4.3 06/18/2018 1236   AST 33 06/18/2018 1236   ALT 93 (H) 06/18/2018 1236   ALKPHOS 79 06/18/2018 1236   BILITOT 0.3 06/18/2018 1236   GFRNONAA >60 06/18/2018 1236   GFRAA >60 06/18/2018 1236         RADIOGRAPHY: Mr Breast Bilateral W Wo Contrast Inc Cad  Result Date: 06/13/2018 CLINICAL DATA:  Status post neoadjuvant treatment for known LEFT breast cancer.Ultrasound-guided core biopsy of mass in the 1:30 o'clock location of the LEFT breast showed grade 3 invasive mammary carcinoma. Biopsy of enlarged LEFT axillary lymph node showed invasive ductal carcinoma. LABS:  None obtained at the time of imaging. EXAM: BILATERAL BREAST MRI WITH AND WITHOUT CONTRAST TECHNIQUE: Multiplanar, multisequence MR images of both breasts were obtained prior to and following the intravenous administration of 7 ml of Gadavist Three-dimensional MR images were rendered by post-processing of the original MR data on an independent workstation. The three-dimensional MR images were interpreted, and findings are reported in the following complete MRI report for this study. Three dimensional images were evaluated at the independent DynaCad workstation COMPARISON:  MRI on 01/10/2017 and earlier  imaging FINDINGS: Breast composition: c. Heterogeneous fibroglandular tissue. Background parenchymal enhancement: Minimal Right breast: No mass or abnormal enhancement. Left breast: Within the UPPER-OUTER QUADRANT of the LEFT breast, there is tissue marker clip following previous biopsy. Surrounding this region there is faint non mass enhancement measuring a total of 1.4 x 1.3 x 0.8 centimeters. Previously mass in this region measured 2.4 x 3.2 x 4.0 centimeters. Lymph nodes: No abnormal appearing lymph nodes. Lymph nodes in the LEFT axilla have a normal morphology today. Ancillary findings:  None. IMPRESSION: 1. Significantly improved appearance of known LEFT breast cancer following neoadjuvant treatment. Non mass enhancement measures maximum of 1.4 centimeters on today's exam. 2. Interval resolution of LEFT axillary adenopathy. 3. No new suspicious findings. RECOMMENDATION: Treatment plan for known LEFT breast malignancy. BI-RADS CATEGORY  6: Known biopsy-proven malignancy. Electronically Signed   By: Nolon Nations M.D.   On:  06/13/2018 14:47      IMPRESSION/PLAN: Left Breast Cancer s/p neoadjuvant chemotherapy  She is anticipating bilateral mastectomies with tissue expanders placed immediately. I will ask Dr. Iran Planas to initially under-expand the contralateral reconstructed breast so that it does not block the tangential fields.   It was a pleasure meeting the patient today. We discussed the risks, benefits, and side effects of radiotherapy. I recommend radiotherapy to the left chest wall and regional nodes to reduce her risk of locoregional recurrence by 2/3.  We discussed that radiation would take approximately 6.5 weeks to complete and that I would give the patient a few weeks to heal following surgery before starting treatment planning. We spoke about acute effects including skin irritation and fatigue as well as much less common late effects including internal organ injury or irritation. We spoke  about the latest technology that is used to minimize the risk of late effects for patients undergoing radiotherapy to the breast or chest wall. We also discussed that radiotherapy can potentially cause complications with reconstruction. No guarantees of treatment were given. The patient is enthusiastic about proceeding with treatment. I look forward to participating in the patient's care.  I will await her referral back to me for postoperative follow-up and eventual CT simulation/treatment planning. __________________________________________   Eppie Gibson, MD  This document serves as a record of services personally performed by Eppie Gibson, MD. It was created on her behalf by Rae Lips, a trained medical scribe. The creation of this record is based on the scribe's personal observations and the provider's statements to them. This document has been checked and approved by the attending provider.

## 2018-06-27 ENCOUNTER — Other Ambulatory Visit: Payer: Commercial Managed Care - PPO

## 2018-06-27 ENCOUNTER — Ambulatory Visit: Payer: Commercial Managed Care - PPO | Admitting: Hematology and Oncology

## 2018-06-27 ENCOUNTER — Other Ambulatory Visit: Payer: Self-pay

## 2018-06-27 ENCOUNTER — Ambulatory Visit
Admission: RE | Admit: 2018-06-27 | Discharge: 2018-06-27 | Disposition: A | Discharge status: Home / Self Care | Payer: Commercial Managed Care - PPO | Source: Ambulatory Visit | Attending: Radiation Oncology | Admitting: Radiation Oncology

## 2018-06-27 ENCOUNTER — Encounter: Payer: Self-pay | Admitting: Radiation Oncology

## 2018-06-27 DIAGNOSIS — Z170 Estrogen receptor positive status [ER+]: Secondary | ICD-10-CM

## 2018-06-27 DIAGNOSIS — C50412 Malignant neoplasm of upper-outer quadrant of left female breast: Secondary | ICD-10-CM | POA: Diagnosis not present

## 2018-06-27 DIAGNOSIS — Z79899 Other long term (current) drug therapy: Secondary | ICD-10-CM | POA: Diagnosis not present

## 2018-06-27 NOTE — Progress Notes (Signed)
Ensure pre surgery drink given with instructions to complete by 0400 dos,surgical soap given with instructions,  pt verbalized understanding. 

## 2018-06-29 ENCOUNTER — Other Ambulatory Visit: Payer: Self-pay | Admitting: General Surgery

## 2018-06-29 ENCOUNTER — Ambulatory Visit
Admission: RE | Admit: 2018-06-29 | Discharge: 2018-06-29 | Disposition: A | Discharge status: Home / Self Care | Payer: Commercial Managed Care - PPO | Source: Ambulatory Visit | Attending: General Surgery | Admitting: General Surgery

## 2018-06-29 DIAGNOSIS — C50412 Malignant neoplasm of upper-outer quadrant of left female breast: Secondary | ICD-10-CM

## 2018-06-29 DIAGNOSIS — Z170 Estrogen receptor positive status [ER+]: Secondary | ICD-10-CM

## 2018-07-02 ENCOUNTER — Ambulatory Visit (HOSPITAL_BASED_OUTPATIENT_CLINIC_OR_DEPARTMENT_OTHER): Payer: Commercial Managed Care - PPO | Admitting: Certified Registered Nurse Anesthetist

## 2018-07-02 ENCOUNTER — Ambulatory Visit
Admission: RE | Admit: 2018-07-02 | Discharge: 2018-07-02 | Disposition: A | Discharge status: Home / Self Care | Payer: Commercial Managed Care - PPO | Source: Ambulatory Visit | Attending: General Surgery | Admitting: General Surgery

## 2018-07-02 ENCOUNTER — Ambulatory Visit (HOSPITAL_BASED_OUTPATIENT_CLINIC_OR_DEPARTMENT_OTHER)
Admission: RE | Admit: 2018-07-02 | Discharge: 2018-07-03 | Disposition: A | Discharge status: Home / Self Care | Payer: Commercial Managed Care - PPO | Source: Ambulatory Visit | Attending: General Surgery | Admitting: General Surgery

## 2018-07-02 ENCOUNTER — Encounter (HOSPITAL_BASED_OUTPATIENT_CLINIC_OR_DEPARTMENT_OTHER)
Admission: RE | Disposition: A | Discharge status: Home / Self Care | Payer: Self-pay | Source: Ambulatory Visit | Attending: General Surgery

## 2018-07-02 ENCOUNTER — Ambulatory Visit (HOSPITAL_COMMUNITY)
Admission: RE | Admit: 2018-07-02 | Discharge: 2018-07-02 | Disposition: A | Discharge status: Home / Self Care | Payer: Commercial Managed Care - PPO | Source: Ambulatory Visit | Attending: General Surgery | Admitting: General Surgery

## 2018-07-02 ENCOUNTER — Encounter (HOSPITAL_BASED_OUTPATIENT_CLINIC_OR_DEPARTMENT_OTHER): Payer: Self-pay | Admitting: *Deleted

## 2018-07-02 DIAGNOSIS — Z803 Family history of malignant neoplasm of breast: Secondary | ICD-10-CM | POA: Insufficient documentation

## 2018-07-02 DIAGNOSIS — Z170 Estrogen receptor positive status [ER+]: Secondary | ICD-10-CM | POA: Diagnosis not present

## 2018-07-02 DIAGNOSIS — C50912 Malignant neoplasm of unspecified site of left female breast: Secondary | ICD-10-CM | POA: Diagnosis present

## 2018-07-02 DIAGNOSIS — C50412 Malignant neoplasm of upper-outer quadrant of left female breast: Secondary | ICD-10-CM

## 2018-07-02 DIAGNOSIS — Z79899 Other long term (current) drug therapy: Secondary | ICD-10-CM | POA: Diagnosis not present

## 2018-07-02 DIAGNOSIS — Z9221 Personal history of antineoplastic chemotherapy: Secondary | ICD-10-CM | POA: Diagnosis not present

## 2018-07-02 HISTORY — DX: Personal history of antineoplastic chemotherapy: Z92.21

## 2018-07-02 HISTORY — PX: BREAST RECONSTRUCTION WITH PLACEMENT OF TISSUE EXPANDER AND ALLODERM: SHX6805

## 2018-07-02 HISTORY — PX: MASTECTOMY WITH RADIOACTIVE SEED GUIDED EXCISION AND AXILLARY SENTINEL LYMPH NODE BIOPSY: SHX6736

## 2018-07-02 HISTORY — DX: Other specified postprocedural states: R11.2

## 2018-07-02 HISTORY — DX: Nausea with vomiting, unspecified: Z98.890

## 2018-07-02 HISTORY — PX: PORTA CATH REMOVAL: CATH118286

## 2018-07-02 SURGERY — MASTECTOMY WITH RADIOACTIVE SEED GUIDED EXCISION AND AXILLARY SENTINEL LYMPH NODE BIOPSY
Anesthesia: General | Site: Chest | Laterality: Right

## 2018-07-02 MED ORDER — LIDOCAINE HCL (CARDIAC) PF 100 MG/5ML IV SOSY
PREFILLED_SYRINGE | INTRAVENOUS | Status: DC | PRN
  Administered 2018-07-02: 40 mg via INTRAVENOUS

## 2018-07-02 MED ORDER — ONDANSETRON HCL 4 MG/2ML IJ SOLN
INTRAMUSCULAR | Status: AC
  Filled 2018-07-02: qty 2

## 2018-07-02 MED ORDER — OXYCODONE HCL 5 MG/5ML PO SOLN: 5.0000 mg | Freq: Once | ORAL | Status: DC | PRN

## 2018-07-02 MED ORDER — MIDAZOLAM HCL 2 MG/2ML IJ SOLN
INTRAMUSCULAR | Status: AC
  Filled 2018-07-02: qty 2

## 2018-07-02 MED ORDER — KETOROLAC TROMETHAMINE 15 MG/ML IJ SOLN
15.0000 mg | INTRAMUSCULAR | Status: AC
  Administered 2018-07-02: 15 mg via INTRAVENOUS

## 2018-07-02 MED ORDER — METHYLENE BLUE 0.5 % INJ SOLN
INTRAVENOUS | Status: AC
  Filled 2018-07-02: qty 10

## 2018-07-02 MED ORDER — CEFAZOLIN SODIUM-DEXTROSE 2-4 GM/100ML-% IV SOLN
2.0000 g | INTRAVENOUS | Status: AC
  Administered 2018-07-02: 2 g via INTRAVENOUS

## 2018-07-02 MED ORDER — FENTANYL CITRATE (PF) 100 MCG/2ML IJ SOLN
INTRAMUSCULAR | Status: AC
  Filled 2018-07-02: qty 2

## 2018-07-02 MED ORDER — FENTANYL CITRATE (PF) 100 MCG/2ML IJ SOLN
50.0000 ug | INTRAMUSCULAR | Status: DC | PRN
  Administered 2018-07-02: 100 ug via INTRAVENOUS

## 2018-07-02 MED ORDER — SODIUM CHLORIDE 0.9 % IV SOLN
INTRAVENOUS | Status: DC
  Administered 2018-07-02: 13:00:00 via INTRAVENOUS

## 2018-07-02 MED ORDER — METHOCARBAMOL 500 MG PO TABS
500.0000 mg | ORAL_TABLET | Freq: Three times a day (TID) | ORAL | Status: DC
  Administered 2018-07-02 (×2): 500 mg via ORAL
  Filled 2018-07-02 (×2): qty 1

## 2018-07-02 MED ORDER — ROCURONIUM BROMIDE 50 MG/5ML IV SOSY
PREFILLED_SYRINGE | INTRAVENOUS | Status: AC
  Filled 2018-07-02: qty 5

## 2018-07-02 MED ORDER — DOXYCYCLINE HYCLATE 50 MG PO CAPS: 50.0000 mg | ORAL_CAPSULE | Freq: Two times a day (BID) | ORAL | 0 refills | Status: DC

## 2018-07-02 MED ORDER — DEXAMETHASONE SODIUM PHOSPHATE 10 MG/ML IJ SOLN
INTRAMUSCULAR | Status: AC
  Filled 2018-07-02: qty 1

## 2018-07-02 MED ORDER — ELETRIPTAN HYDROBROMIDE 40 MG PO TABS: 40.0000 mg | ORAL_TABLET | ORAL | Status: DC | PRN

## 2018-07-02 MED ORDER — EPHEDRINE 5 MG/ML INJ
INTRAVENOUS | Status: AC
  Filled 2018-07-02: qty 10

## 2018-07-02 MED ORDER — CEFAZOLIN SODIUM-DEXTROSE 2-4 GM/100ML-% IV SOLN
INTRAVENOUS | Status: AC
  Filled 2018-07-02: qty 100

## 2018-07-02 MED ORDER — OXYCODONE HCL 5 MG PO TABS
5.0000 mg | ORAL_TABLET | ORAL | Status: DC | PRN
  Administered 2018-07-02 (×2): 10 mg via ORAL
  Administered 2018-07-03 (×2): 5 mg via ORAL
  Filled 2018-07-02 (×4): qty 2

## 2018-07-02 MED ORDER — EPHEDRINE SULFATE-NACL 50-0.9 MG/10ML-% IV SOSY
PREFILLED_SYRINGE | INTRAVENOUS | Status: DC | PRN
  Administered 2018-07-02: 5 mg via INTRAVENOUS

## 2018-07-02 MED ORDER — SODIUM CHLORIDE (PF) 0.9 % IJ SOLN
INTRAMUSCULAR | Status: AC
  Filled 2018-07-02: qty 10

## 2018-07-02 MED ORDER — OXYCODONE HCL 5 MG PO TABS: 5.0000 mg | ORAL_TABLET | ORAL | 0 refills | Status: DC | PRN

## 2018-07-02 MED ORDER — ONDANSETRON HCL 4 MG/2ML IJ SOLN
4.0000 mg | Freq: Four times a day (QID) | INTRAMUSCULAR | Status: DC | PRN
  Filled 2018-07-02: qty 2

## 2018-07-02 MED ORDER — SODIUM CHLORIDE 0.9 % IV SOLN
INTRAVENOUS | Status: DC | PRN
  Administered 2018-07-02: 1000 mL

## 2018-07-02 MED ORDER — BACLOFEN 10 MG PO TABS: 10.0000 mg | ORAL_TABLET | Freq: Two times a day (BID) | ORAL | Status: DC

## 2018-07-02 MED ORDER — CHLORHEXIDINE GLUCONATE CLOTH 2 % EX PADS: 6.0000 | MEDICATED_PAD | Freq: Once | CUTANEOUS | Status: DC

## 2018-07-02 MED ORDER — ROCURONIUM BROMIDE 50 MG/5ML IV SOSY
PREFILLED_SYRINGE | INTRAVENOUS | Status: AC
  Filled 2018-07-02: qty 10

## 2018-07-02 MED ORDER — MORPHINE SULFATE (PF) 4 MG/ML IV SOLN
1.0000 mg | INTRAVENOUS | Status: DC | PRN
  Administered 2018-07-02 – 2018-07-03 (×3): 1 mg via INTRAVENOUS
  Filled 2018-07-02 (×3): qty 1

## 2018-07-02 MED ORDER — LACTATED RINGERS IV SOLN
INTRAVENOUS | Status: DC
  Administered 2018-07-02 (×3): via INTRAVENOUS

## 2018-07-02 MED ORDER — KETOROLAC TROMETHAMINE 15 MG/ML IJ SOLN
INTRAMUSCULAR | Status: AC
  Filled 2018-07-02: qty 1

## 2018-07-02 MED ORDER — BUPIVACAINE-EPINEPHRINE (PF) 0.25% -1:200000 IJ SOLN
INTRAMUSCULAR | Status: AC
  Filled 2018-07-02: qty 60

## 2018-07-02 MED ORDER — SUGAMMADEX SODIUM 200 MG/2ML IV SOLN
INTRAVENOUS | Status: DC | PRN
  Administered 2018-07-02: 140 mg via INTRAVENOUS

## 2018-07-02 MED ORDER — PROMETHAZINE HCL 25 MG/ML IJ SOLN
INTRAMUSCULAR | Status: AC
  Filled 2018-07-02: qty 1

## 2018-07-02 MED ORDER — ONDANSETRON 4 MG PO TBDP
4.0000 mg | ORAL_TABLET | Freq: Four times a day (QID) | ORAL | Status: DC | PRN
  Administered 2018-07-03: 4 mg via ORAL

## 2018-07-02 MED ORDER — PHENYLEPHRINE 40 MCG/ML (10ML) SYRINGE FOR IV PUSH (FOR BLOOD PRESSURE SUPPORT)
PREFILLED_SYRINGE | INTRAVENOUS | Status: AC
  Filled 2018-07-02: qty 10

## 2018-07-02 MED ORDER — FENTANYL CITRATE (PF) 100 MCG/2ML IJ SOLN
25.0000 ug | INTRAMUSCULAR | Status: DC | PRN
  Administered 2018-07-02 (×2): 25 ug via INTRAVENOUS

## 2018-07-02 MED ORDER — BUPROPION HCL 75 MG PO TABS
75.0000 mg | ORAL_TABLET | Freq: Two times a day (BID) | ORAL | Status: DC
  Administered 2018-07-02: 75 mg via ORAL

## 2018-07-02 MED ORDER — ROCURONIUM BROMIDE 100 MG/10ML IV SOLN
INTRAVENOUS | Status: DC | PRN
  Administered 2018-07-02 (×3): 20 mg via INTRAVENOUS
  Administered 2018-07-02 (×2): 30 mg via INTRAVENOUS
  Administered 2018-07-02: 50 mg via INTRAVENOUS

## 2018-07-02 MED ORDER — BUPIVACAINE HCL (PF) 0.25 % IJ SOLN
INTRAMUSCULAR | Status: AC
  Filled 2018-07-02: qty 30

## 2018-07-02 MED ORDER — SCOPOLAMINE 1 MG/3DAYS TD PT72
MEDICATED_PATCH | TRANSDERMAL | Status: AC
  Filled 2018-07-02: qty 1

## 2018-07-02 MED ORDER — ACETAMINOPHEN 500 MG PO TABS
1000.0000 mg | ORAL_TABLET | Freq: Four times a day (QID) | ORAL | Status: DC
  Administered 2018-07-02 – 2018-07-03 (×3): 1000 mg via ORAL
  Filled 2018-07-02 (×3): qty 2

## 2018-07-02 MED ORDER — SCOPOLAMINE 1 MG/3DAYS TD PT72
1.0000 | MEDICATED_PATCH | Freq: Once | TRANSDERMAL | Status: DC | PRN
  Administered 2018-07-02: 1.5 mg via TRANSDERMAL

## 2018-07-02 MED ORDER — MIDAZOLAM HCL 2 MG/2ML IJ SOLN
1.0000 mg | INTRAMUSCULAR | Status: DC | PRN
  Administered 2018-07-02: 2 mg via INTRAVENOUS

## 2018-07-02 MED ORDER — BUPIVACAINE-EPINEPHRINE (PF) 0.5% -1:200000 IJ SOLN
INTRAMUSCULAR | Status: DC | PRN
  Administered 2018-07-02 (×2): 15 mL via PERINEURAL

## 2018-07-02 MED ORDER — ENSURE PRE-SURGERY PO LIQD: 296.0000 mL | Freq: Once | ORAL | Status: DC

## 2018-07-02 MED ORDER — CEFAZOLIN SODIUM-DEXTROSE 2-4 GM/100ML-% IV SOLN
2.0000 g | Freq: Three times a day (TID) | INTRAVENOUS | Status: DC
  Administered 2018-07-02 – 2018-07-03 (×3): 2 g via INTRAVENOUS
  Filled 2018-07-02 (×3): qty 100

## 2018-07-02 MED ORDER — LIDOCAINE 2% (20 MG/ML) 5 ML SYRINGE
INTRAMUSCULAR | Status: AC
  Filled 2018-07-02: qty 5

## 2018-07-02 MED ORDER — GABAPENTIN 100 MG PO CAPS
ORAL_CAPSULE | ORAL | Status: AC
  Filled 2018-07-02: qty 1

## 2018-07-02 MED ORDER — DEXAMETHASONE SODIUM PHOSPHATE 10 MG/ML IJ SOLN
INTRAMUSCULAR | Status: DC | PRN
  Administered 2018-07-02: 8 mg via INTRAVENOUS

## 2018-07-02 MED ORDER — FENTANYL CITRATE (PF) 100 MCG/2ML IJ SOLN
INTRAMUSCULAR | Status: DC | PRN
  Administered 2018-07-02: 100 ug via INTRAVENOUS

## 2018-07-02 MED ORDER — ACETAMINOPHEN 500 MG PO TABS
1000.0000 mg | ORAL_TABLET | ORAL | Status: AC
  Administered 2018-07-02: 1000 mg via ORAL

## 2018-07-02 MED ORDER — SUGAMMADEX SODIUM 200 MG/2ML IV SOLN
INTRAVENOUS | Status: AC
  Filled 2018-07-02: qty 2

## 2018-07-02 MED ORDER — GABAPENTIN 100 MG PO CAPS
100.0000 mg | ORAL_CAPSULE | ORAL | Status: AC
  Administered 2018-07-02: 100 mg via ORAL

## 2018-07-02 MED ORDER — PROPOFOL 10 MG/ML IV BOLUS
INTRAVENOUS | Status: AC
  Filled 2018-07-02: qty 20

## 2018-07-02 MED ORDER — ACETAMINOPHEN 500 MG PO TABS
ORAL_TABLET | ORAL | Status: AC
  Filled 2018-07-02: qty 2

## 2018-07-02 MED ORDER — PHENYLEPHRINE 40 MCG/ML (10ML) SYRINGE FOR IV PUSH (FOR BLOOD PRESSURE SUPPORT)
PREFILLED_SYRINGE | INTRAVENOUS | Status: DC | PRN
  Administered 2018-07-02: 80 ug via INTRAVENOUS
  Administered 2018-07-02 (×3): 40 ug via INTRAVENOUS
  Administered 2018-07-02: 80 ug via INTRAVENOUS

## 2018-07-02 MED ORDER — PROMETHAZINE HCL 25 MG/ML IJ SOLN
6.2500 mg | Freq: Once | INTRAMUSCULAR | Status: AC
  Administered 2018-07-02: 6.25 mg via INTRAVENOUS

## 2018-07-02 MED ORDER — SIMETHICONE 80 MG PO CHEW: 40.0000 mg | CHEWABLE_TABLET | Freq: Four times a day (QID) | ORAL | Status: DC | PRN

## 2018-07-02 MED ORDER — ONDANSETRON HCL 4 MG/2ML IJ SOLN
4.0000 mg | Freq: Four times a day (QID) | INTRAMUSCULAR | Status: AC | PRN
  Administered 2018-07-02: 4 mg via INTRAVENOUS

## 2018-07-02 MED ORDER — MIDAZOLAM HCL 2 MG/2ML IJ SOLN
INTRAMUSCULAR | Status: DC | PRN
  Administered 2018-07-02: 2 mg via INTRAVENOUS

## 2018-07-02 MED ORDER — PROPOFOL 10 MG/ML IV BOLUS
INTRAVENOUS | Status: DC | PRN
  Administered 2018-07-02: 150 mg via INTRAVENOUS

## 2018-07-02 MED ORDER — TECHNETIUM TC 99M SULFUR COLLOID FILTERED
1.0000 | Freq: Once | INTRAVENOUS | Status: AC | PRN
  Administered 2018-07-02: 1 via INTRADERMAL

## 2018-07-02 MED ORDER — ONDANSETRON HCL 4 MG/2ML IJ SOLN
INTRAMUSCULAR | Status: DC | PRN
  Administered 2018-07-02: 4 mg via INTRAVENOUS

## 2018-07-02 MED ORDER — OXYCODONE HCL 5 MG PO TABS: 5.0000 mg | ORAL_TABLET | Freq: Once | ORAL | Status: DC | PRN

## 2018-07-02 SURGICAL SUPPLY — 74 items
ALLOGRAFT PERF 16X20 1.6+/-0.4 (Tissue) ×6 IMPLANT
APPLIER CLIP 11 MED OPEN (CLIP) ×3
BAG DECANTER FOR FLEXI CONT (MISCELLANEOUS) ×3 IMPLANT
BINDER BREAST LRG (GAUZE/BANDAGES/DRESSINGS) IMPLANT
BINDER BREAST XLRG (GAUZE/BANDAGES/DRESSINGS) ×3 IMPLANT
BIOPATCH RED 1 DISK 7.0 (GAUZE/BANDAGES/DRESSINGS) IMPLANT
BLADE SURG 10 STRL SS (BLADE) ×6 IMPLANT
BLADE SURG 15 STRL LF DISP TIS (BLADE) ×2 IMPLANT
BLADE SURG 15 STRL SS (BLADE) ×1
BNDG GAUZE ELAST 4 BULKY (GAUZE/BANDAGES/DRESSINGS) ×6 IMPLANT
CANISTER SUCT 1200ML W/VALVE (MISCELLANEOUS) ×3 IMPLANT
CHLORAPREP W/TINT 26ML (MISCELLANEOUS) ×6 IMPLANT
CLIP APPLIE 11 MED OPEN (CLIP) ×2 IMPLANT
COVER BACK TABLE 60X90IN (DRAPES) ×3 IMPLANT
COVER MAYO STAND STRL (DRAPES) ×6 IMPLANT
COVER PROBE W GEL 5X96 (DRAPES) ×3 IMPLANT
DERMABOND ADVANCED (GAUZE/BANDAGES/DRESSINGS) ×3
DERMABOND ADVANCED .7 DNX12 (GAUZE/BANDAGES/DRESSINGS) ×6 IMPLANT
DRAIN CHANNEL 15F RND FF W/TCR (WOUND CARE) ×6 IMPLANT
DRAIN CHANNEL 19F RND (DRAIN) ×6 IMPLANT
DRAPE TOP ARMCOVERS (MISCELLANEOUS) ×3 IMPLANT
DRAPE U-SHAPE 76X120 STRL (DRAPES) ×3 IMPLANT
DRAPE UTILITY XL STRL (DRAPES) ×6 IMPLANT
DRSG PAD ABDOMINAL 8X10 ST (GAUZE/BANDAGES/DRESSINGS) ×9 IMPLANT
DRSG TEGADERM 4X10 (GAUZE/BANDAGES/DRESSINGS) ×9 IMPLANT
ELECT BLADE 4.0 EZ CLEAN MEGAD (MISCELLANEOUS) ×3
ELECT COATED BLADE 2.86 ST (ELECTRODE) ×3 IMPLANT
ELECT REM PT RETURN 9FT ADLT (ELECTROSURGICAL) ×3
ELECTRODE BLDE 4.0 EZ CLN MEGD (MISCELLANEOUS) ×2 IMPLANT
ELECTRODE REM PT RTRN 9FT ADLT (ELECTROSURGICAL) ×2 IMPLANT
EVACUATOR SILICONE 100CC (DRAIN) ×12 IMPLANT
EXPANDER TISSUE FV FOURTE 400 (Prosthesis & Implant Plastic) ×4 IMPLANT
GLOVE BIO SURGEON STRL SZ 6 (GLOVE) ×12 IMPLANT
GLOVE BIO SURGEON STRL SZ7 (GLOVE) ×6 IMPLANT
GLOVE BIOGEL PI IND STRL 6.5 (GLOVE) ×4 IMPLANT
GLOVE BIOGEL PI IND STRL 7.0 (GLOVE) ×2 IMPLANT
GLOVE BIOGEL PI IND STRL 7.5 (GLOVE) ×4 IMPLANT
GLOVE BIOGEL PI INDICATOR 6.5 (GLOVE) ×2
GLOVE BIOGEL PI INDICATOR 7.0 (GLOVE) ×1
GLOVE BIOGEL PI INDICATOR 7.5 (GLOVE) ×2
GOWN STRL REUS W/ TWL LRG LVL3 (GOWN DISPOSABLE) ×8 IMPLANT
GOWN STRL REUS W/TWL LRG LVL3 (GOWN DISPOSABLE) ×4
LIGHT WAVEGUIDE WIDE FLAT (MISCELLANEOUS) ×3 IMPLANT
NS IRRIG 1000ML POUR BTL (IV SOLUTION) ×3 IMPLANT
PACK BASIN DAY SURGERY FS (CUSTOM PROCEDURE TRAY) ×3 IMPLANT
PENCIL BUTTON HOLSTER BLD 10FT (ELECTRODE) ×3 IMPLANT
PIN SAFETY STERILE (MISCELLANEOUS) ×3 IMPLANT
SHEET MEDIUM DRAPE 40X70 STRL (DRAPES) ×3 IMPLANT
SLEEVE SCD COMPRESS KNEE MED (MISCELLANEOUS) ×3 IMPLANT
SPONGE LAP 18X18 RF (DISPOSABLE) ×12 IMPLANT
STAPLER VISISTAT 35W (STAPLE) ×6 IMPLANT
SUT CHROMIC 4 0 PS 2 18 (SUTURE) ×18 IMPLANT
SUT ETHILON 2 0 FS 18 (SUTURE) ×6 IMPLANT
SUT MNCRL AB 4-0 PS2 18 (SUTURE) ×9 IMPLANT
SUT SILK 2 0 SH (SUTURE) ×6 IMPLANT
SUT VIC AB 2-0 SH 27 (SUTURE) ×1
SUT VIC AB 2-0 SH 27XBRD (SUTURE) ×2 IMPLANT
SUT VIC AB 3-0 SH 27 (SUTURE) ×4
SUT VIC AB 3-0 SH 27X BRD (SUTURE) ×8 IMPLANT
SUT VICRYL 0 CT-2 (SUTURE) ×12 IMPLANT
SUT VICRYL 3-0 CR8 SH (SUTURE) ×3 IMPLANT
SUT VICRYL 4-0 PS2 18IN ABS (SUTURE) ×6 IMPLANT
SUT VLOC 180 0 24IN GS25 (SUTURE) ×6 IMPLANT
SYR 50ML LL SCALE MARK (SYRINGE) ×3 IMPLANT
SYR BULB IRRIGATION 50ML (SYRINGE) ×6 IMPLANT
TISSUE EXPNDR FV FOURTE 400 (Prosthesis & Implant Plastic) ×6 IMPLANT
TOWEL GREEN STERILE FF (TOWEL DISPOSABLE) ×6 IMPLANT
TOWEL OR NON WOVEN STRL DISP B (DISPOSABLE) ×3 IMPLANT
TRAY DSU PREP LF (CUSTOM PROCEDURE TRAY) ×3 IMPLANT
TRAY FAXITRON CT DISP (TRAY / TRAY PROCEDURE) ×3 IMPLANT
TRAY FOLEY W/BAG SLVR 14FR LF (SET/KITS/TRAYS/PACK) ×3 IMPLANT
TUBE CONNECTING 20X1/4 (TUBING) ×6 IMPLANT
UNDERPAD 30X30 (UNDERPADS AND DIAPERS) ×6 IMPLANT
YANKAUER SUCT BULB TIP NO VENT (SUCTIONS) ×3 IMPLANT

## 2018-07-02 NOTE — Interval H&P Note (Signed)
History and Physical Interval Note:  07/02/2018 7:17 AM  Kristi [name]  has presented today for surgery, with the diagnosis of LEFT BREAST CANCER  The various methods of treatment have been discussed with the patient and family. After consideration of risks, benefits and other options for treatment, the patient has consented to  Procedure(s): BILATERAL NIPPLE SPARING MASTECTOMIES WITH LEFT AXILLARY SENTINEL LYMPH NODE BIOPSY AND LEFT SEED GUIDED NODE EXCISION (Bilateral) BILATERAL BREAST RECONSTRUCTION WITH PLACEMENT OF TISSUE EXPANDER AND ACELLULAR DERMIS (Bilateral) as a surgical intervention .  The patient's history has been reviewed, patient examined, no change in status, stable for surgery.  I have reviewed the patient's chart and labs.  Questions were answered to the patient's satisfaction.     Rolm Bookbinder

## 2018-07-02 NOTE — Anesthesia Procedure Notes (Signed)
Anesthesia Regional Block: Pectoralis block   Pre-Anesthetic Checklist: ,, timeout performed, Correct Patient, Correct Site, Correct Laterality, Correct Procedure, Correct Position, site marked, Risks and benefits discussed,  Surgical consent,  Pre-op evaluation,  At surgeon's request and post-op pain management  Laterality: Right  Prep: chloraprep       Needles:  Injection technique: Single-shot  Needle Type: Echogenic Needle     Needle Length: 9cm  Needle Gauge: 21     Additional Needles:   Narrative:  Start time: 07/02/2018 7:18 AM End time: 07/02/2018 7:26 AM Injection made incrementally with aspirations every 5 mL.  Performed by: Personally  Anesthesiologist: Albertha Ghee, MD  Additional Notes: Pt tolerated the procedure well.

## 2018-07-02 NOTE — Progress Notes (Signed)
Assisted Dr. Hodierne with right, left, ultrasound guided, pectoralis block. Side rails up, monitors on throughout procedure. See vital signs in flow sheet. Tolerated Procedure well. °

## 2018-07-02 NOTE — Op Note (Signed)
Preoperative diagnosis: Left breast cancer, clinical stage II status post primary systemic therapy Postoperative diagnosis: Same as above Procedure: 1.  Right risk reducing nipple sparing mastectomy 2.  Port removal 3.  Left nipple sparing mastectomy 4.  Left deep axillary sentinel lymph node biopsy 5.  Left axillary radioactive seed guided excision of node Surgeon: Dr. Serita Grammes Assistant: Dr. Irene Limbo Estimated blood loss: 150 cc Specimens: 1.  Right nipple sparing mastectomy marked short superior, long lateral, double nipple areola 2.  Right nipple specimen 3.  Left targeted axillary lymph node dissection containing the radioactive seed as well as the sentinel nodes and the clip 4.  Left nipple sparing mastectomy marked short superior, long lateral, double nipple areola 5.  Left nipple specimen Complications: None Drains: Per plastic surgery Sponge needle count was correct Disposition Case turned over to plastic surgery for reconstruction  Indications: This a 42 year old female who presented with a lymph node positive ER PR positive breast cancer.  We elected to proceed with primary chemotherapy to attempt to downstage her.  She has done well with chemotherapy.  Her MRI shows resolution of her nodal disease as well as a significant response in the breast.  We discussed all of her options and she is elected undergo bilateral nipple sparing mastectomy with a left axillary targeted lymph node dissection combined with reconstruction.  Procedure: After informed consent was obtained the patient first underwent a bilateral pectoral block and injection of technetium on the left side.  I confirmed the presence of the radioactive seed in the left axilla.  She was then placed under general anesthesia without complication.  She was given antibiotics and SCDs were in place.  She was prepped and draped in the standard sterile surgical fashion.  Surgical timeout was then performed.  I  first did the risk reducing right side.  I made an incision is started 8 cm from the xiphoid process and curved in the inframammary fold about 11 cm.  I remove the breast and the pectoralis fascia from the muscle.  I then did the anterior flap to the parasternal area, clavicle, and the latissimus laterally.  I remove this breast tissue using a combination of cautery as well as facelift scissors.  This was then passed off the table and marked as above.  I then remove the nipple specimen separately.  Hemostasis was observed.  I then was able to remove the port from the same incision in its entirety along with the suture.  I used 2-0 Vicryl suture to close the port pocket.  I then packed this in turn to the other side.  I first did the targeted lymph node dissection.  I made an incision right below where the hair would grow in her axilla.  I dissected through the axillary fascia.  I was able to identify the seed with the neoprobe.  There were several nodes that were visible but appeared normal.  I removed a bundle of these lymph nodes.  I elected not to inject blue dye in her as I was very concerned about the health of her nipple areola and her skin.  I was able to get a very adequate sentinel node specimen for the treatment of her cancer.  The targeted lymph node bundle was also the sentinel lymph node.  I confirmed removal of the clip and the seed with mammography and this was sent to radiology.  I closed this with 2-0 Vicryl 3-0 Vicryl and 4-0 Monocryl. I then made a similar  incision to the other side in the inframammary fold on the left side.  I carried this to the clavicle, parasternal area, and latissimus laterally.  I then created the anterior flap.  The breast was then removed and passed off the table.  I ensure that there was no more breast tissue adherent to the skin.  Hemostasis was observed and I then passed this case on to Dr. Iran Planas for completion.

## 2018-07-02 NOTE — H&P (Signed)
Subjective:     Patient ID: Kristi [name] is a 42 y.o. female.  HPI  Here for follow up discussion breast reconstruction prior to planned bilateral mastectomies. Presented with palpable mass left breast. MMG/US with left breast UOQ 2.3 cm mass, axilla with several enlarged LN; biopsy IDC with perineural invasion, LN also positive for IDC, ER/PR+,  HER-2 IHC equivocal.  MRI necrotic mass measuring 2.4 x 3.2 x 4 cm in the posterior UOQ left breast with 4 abnormal LN noted.   Staging scans negative.  Completed neoadjuvant chemotherapy 11.22.19. Final MRI demonstrated NME measuring a total of 1.4 x 1.3 x 0.8 cm, interval resolution of LEFT axillary adenopathy.  Mastectomy planned with ALND vs TAD. Patient desires bilateral mastectomies. She will receive adjuvant radiation.  Genetics with STK11 VUS.  Current B cup, desires C. Prior to diagnosis, lost 20 lb through diet exercise over last year and goal 125 lb. Lives with parents. Teacher, English as a foreign language for [employer].     Objective:   Physical Exam  Cardiovascular: Normal rate, regular rhythm and normal heart sounds.  Pulmonary/Chest: Effort normal and breath sounds normal.  Abdominal: Soft.   Grade 1 ptosis bilateral, left >right volume Left breast mass UOQ SN to nipple R 25 L 25 cm BW R 15 L 16 cm (CW 12 cm) Nipple to IMF R 7 L 7 cm    Assessment:     Left breast ca UOQ ER+ Neoadjuvant chemotherapy    Plan:     Plan bilateral NSM with immediate expander, acellular dermis reconstruction.   Reviewed incisions, drains, OR length, hospital stay and recovery, limitations. Discussed process of expansion and implant based risks including rupture, MRI surveillance for silicone implants, infection requiring surgery or removal, contracture. Reviewed risks mastectomy flap necrosis requiring additional surgery.  Discussed use of acellular dermis in reconstruction, cadaveric source, incorporation over several weeks, risk that  if has seroma or infection can act as additional nidus for infection if not incorporated.  Discussed prepectoral vs sub pectoral reconstruction. Discussed with patient and benefit of this is no animation deformity, may be less pain. Risk may be more visible rippling over upper poles, greater need of ADM. Reviewed pre pectoral would require larger amount acellular dermis, more drains. Discussed any type reconstruction also risks long term displacement implant and visible rippling. If prepectoral counseled I would recommend she be comfortable with silicone implants as more options that have less rippling. Reviewed current data regarding prepectoral and RT. She agrees to prepectoral placement.  Reviewed reconstruction will be asensate and not stimulate. Reviewed additional risks including but not limited to risks mastectomy flap necrosis requiring additional surgery, seroma, hematoma, asymmetry, need to additional procedures, fat necrosis, DVT/PE, damage to adjacent structures, cardiopulmonary complications.  Irene Limbo, MD [facility]astic [facility] Re[facility] Surgery [phone], pin [phone]

## 2018-07-02 NOTE — Anesthesia Procedure Notes (Signed)
Procedure Name: Intubation Date/Time: 07/02/2018 7:52 AM Performed by: Raenette Rover, CRNA Pre-anesthesia Checklist: Patient identified, Emergency Drugs available, Suction available and Patient being monitored Patient Re-evaluated:Patient Re-evaluated prior to induction Oxygen Delivery Method: Circle system utilized Preoxygenation: Pre-oxygenation with 100% oxygen Induction Type: IV induction Ventilation: Mask ventilation without difficulty Laryngoscope Size: Mac and 3 Grade View: Grade I Tube type: Oral Tube size: 7.0 mm Number of attempts: 1 Airway Equipment and Method: Stylet Placement Confirmation: ETT inserted through vocal cords under direct vision,  positive ETCO2,  CO2 detector and breath sounds checked- equal and bilateral Secured at: 21 cm Tube secured with: Tape Dental Injury: Teeth and Oropharynx as per pre-operative assessment

## 2018-07-02 NOTE — Anesthesia Procedure Notes (Signed)
Anesthesia Regional Block: Pectoralis block   Pre-Anesthetic Checklist: ,, timeout performed, Correct Patient, Correct Site, Correct Laterality, Correct Procedure, Correct Position, site marked, Risks and benefits discussed,  Surgical consent,  Pre-op evaluation,  At surgeon's request and post-op pain management  Laterality: Left  Prep: chloraprep       Needles:  Injection technique: Single-shot  Needle Type: Echogenic Needle     Needle Length: 9cm  Needle Gauge: 21     Additional Needles:   Narrative:  Start time: 07/02/2018 7:06 AM End time: 07/02/2018 7:14 AM Injection made incrementally with aspirations every 5 mL.  Performed by: Personally  Anesthesiologist: Albertha Ghee, MD  Additional Notes: Pt tolerated the procedure well.

## 2018-07-02 NOTE — H&P (View-Only) (Signed)
Kristi [name] is an 42 y.o. female.   Chief Complaint: breast cancer HPI: 51 yof referred by Lorenza Evangelist for new left breast cancer earlier this year. she has fh of breast cancer in maternal great aunt who was elderly and in a maternal aunt who had ovarian cancer at around 13. no testing prior. she has no dc. she felt mass in uoq in december that recently has enlarged. this was evaluated by mm. she has c density breasts. there is 22 mm mass in uoq of left breast (right is normal) on mm that on [place] measures 23x18x17 mm. there are abnormal nodes that are not quantified. US biopsy of node and tumor were done. 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 it is er/pr pos, her 2 equivocal but negative by FISH. this is grade III. she has negative genetic testing. she had mri that showed a 2.4x3.2x4 cm mass in uoq of left breast. she has four abnl nodes on mri. she has completed ac and has only three cycles of taxol. she is doing great. due to see Dr Iran Planas today also. has surgery scheduled.  MRI post chemo is IMPRESSION: 1. Significantly improved appearance of known LEFT breast cancer following neoadjuvant treatment. Non mass enhancement measures maximum of 1.4 centimeters on today's exam. 2. Interval resolution of LEFT axillary adenopathy. 3. No new suspicious findings   Past Medical History:  Diagnosis Date  . Breast cancer, left ([place]) 12/2017  . Endometriosis   . Family history of breast cancer   . Family history of ovarian cancer   . Family history of pancreatic cancer   . Family history of prostate cancer   . History of chemotherapy    finished chemo 06/18/2018  . Migraines   . PONV (postoperative nausea and vomiting)     Past Surgical History:  Procedure Laterality Date  . NASAL POLYP EXCISION    . OVARIAN CYST REMOVAL    . PORTACATH PLACEMENT Right 01/15/2018   Procedure: INSERTION PORT-A-CATH WITH [place];  Surgeon: Rolm Bookbinder,  MD;  Location: [place];  Service: General;  Laterality: Right;  . WISDOM TOOTH EXTRACTION      Family History  Problem Relation Age of Onset  . Prostate cancer Father 81       Stage 1  . Ovarian cancer Maternal Aunt 19       ? Germ cell?  . Prostate cancer Maternal Uncle 65       prostectomy  . Pancreatic cancer Maternal Grandmother 34  . Colon cancer Maternal Grandfather 9  . Heart attack Paternal Grandmother   . Prostate cancer Paternal Grandfather        dx in his 1s, d. 9s-90s  . Breast cancer Other        MGMs sister dx over 27   Social History:  reports that she has never smoked. She has never used smokeless tobacco. She reports that she drinks alcohol. She reports that she does not use drugs.  Allergies:  Allergies  Allergen Reactions  . Sulfa Antibiotics Rash and Other (See Comments)    SEVERE HEADACHE    Medications Prior to Admission  Medication Sig Dispense Refill  . baclofen (LIORESAL) 10 MG tablet Take 10 mg by mouth 2 (two) times daily.     Marland Kitchen buPROPion (WELLBUTRIN) 75 MG tablet Take 75 mg by mouth 2 (two) times daily.    . Calcium Carbonate-Vitamin D3 (CALCIUM 600-D) 600-400 MG-UNIT TABS     .  EMGALITY 120 MG/ML SOAJ 120 mg every 30 (thirty) days.    Marland Kitchen gabapentin (NEURONTIN) 300 MG capsule Take 1 capsule (300 mg total) by mouth at bedtime. 90 capsule 3  . Goserelin Acetate (ZOLADEX [place]) Inject into the skin.    Marland Kitchen LORazepam (ATIVAN) 0.5 MG tablet TAKE 1 TABLET BY MOUTH EVERY 8 HOURS AS NEEDED FOR ANXIETY 30 tablet 1  . Magnesium 500 MG CAPS Take 500 mg by mouth 2 (two) times daily.    . Multiple Vitamins-Minerals (HAIR SKIN AND NAILS FORMULA PO) Take by mouth.    . riboflavin (VITAMIN B-2) 100 MG TABS tablet Take 400 mg by mouth 2 (two) times daily.    Marland Kitchen azithromycin (ZITHROMAX Z-PAK) 250 MG tablet Take 2 tablets on day one, then 1 Tablet X 4 days. 6 each 0  . eletriptan (RELPAX) 40 MG tablet TAKE ONE TABLET BY MOUTH AT ONSET OF MIGRAINE. IF  SYMPTOMS PERSIST A SECOND DOSE MAY BE TAKEN IN 2 HOURS. DO NOT EXCEED 2 DOSES IN A 24 HOUR  3  . ondansetron (ZOFRAN) 8 MG tablet TAKE 1 TABLET BY MOUTH TWICE DAILY AS NEEDED START ON THE 3RD DAY AFTER CHEMOTHERAPY      No results found for this or any previous visit (from the past 42 hour(s)). No results found.  ROS Negative  Blood pressure 125/89, pulse 100, temperature 98.1 F (36.7 C), temperature source Oral, resp. rate (!) 22, height 5\' 5"  (1.651 m), weight 66.4 kg, SpO2 100 %. Physical Exam  cv rrr Lungs clear Ab soft nt Physical Exam Rolm Bookbinder MD; 05/30/2018 2:58 PM) General Mental Status-Alert. Orientation-Oriented X3. Breast Nipples-No Discharge. Breast Lump-No Palpable Breast Mass. Lymphatic Head & Neck General Head & Neck Lymphatics: Bilateral - Description - Normal. Axillary General Axillary Region: Bilateral - Description - Normal. Note: no [place] adenopathy  Assessment/Plan Left nsm, left tad , right risk reducing nsm  Rolm Bookbinder, MD 07/02/2018, 7:15 AM

## 2018-07-02 NOTE — Transfer of Care (Signed)
Immediate Anesthesia Transfer of Care Note  Patient: Kristi [name]  Procedure(s) Performed: BILATERAL NIPPLE SPARING MASTECTOMIES WITH LEFT AXILLARY SENTINEL LYMPH NODE BIOPSY AND LEFT SEED GUIDED NODE EXCISION (Bilateral Breast) PORTA CATH REMOVAL (Right Chest) BILATERAL BREAST RECONSTRUCTION WITH PLACEMENT OF TISSUE EXPANDER AND ACELLULAR DERMIS (Bilateral Breast)  Patient Location: PACU  Anesthesia Type:GA combined with regional for post-op pain  Level of Consciousness: awake, alert , oriented and patient cooperative  Airway & Oxygen Therapy: Patient Spontanous Breathing  Post-op Assessment: Report given to RN and Post -op Vital signs reviewed and stable  Post vital signs: Reviewed and stable  Last Vitals:  Vitals Value Taken Time  BP 141/83 07/02/2018 11:23 AM  Temp    Pulse 117 07/02/2018 11:24 AM  Resp 14 07/02/2018 11:24 AM  SpO2 99 % 07/02/2018 11:24 AM  Vitals shown include unvalidated device data.  Last Pain:  Vitals:   07/02/18 0644  TempSrc: Oral  PainSc: 0-No pain         Complications: No apparent anesthesia complications

## 2018-07-02 NOTE — Anesthesia Preprocedure Evaluation (Signed)
Anesthesia Evaluation  Patient identified by MRN, date of birth, ID band Patient awake    Reviewed: Allergy & Precautions, H&P , NPO status , Patient's Chart, lab work & pertinent test results  History of Anesthesia Complications (+) PONV and history of anesthetic complications  Airway Mallampati: II   Neck ROM: full    Dental   Pulmonary neg pulmonary ROS,    breath sounds clear to auscultation       Cardiovascular negative cardio ROS   Rhythm:regular Rate:Normal     Neuro/Psych  Headaches,    GI/Hepatic   Endo/Other    Renal/GU      Musculoskeletal   Abdominal   Peds  Hematology   Anesthesia Other Findings   Reproductive/Obstetrics Left breast CA                             Anesthesia Physical Anesthesia Plan  ASA: II  Anesthesia Plan: General   Post-op Pain Management:  Regional for Post-op pain   Induction: Intravenous  PONV Risk Score and Plan: 4 or greater and Ondansetron, Dexamethasone, Midazolam, Scopolamine patch - Pre-op and Treatment may vary due to age or medical condition  Airway Management Planned: Oral ETT  Additional Equipment:   Intra-op Plan:   Post-operative Plan: Extubation in OR  Informed Consent: I have reviewed the patients History and Physical, chart, labs and discussed the procedure including the risks, benefits and alternatives for the proposed anesthesia with the patient or authorized representative who has indicated his/her understanding and acceptance.     Plan Discussed with: CRNA, Anesthesiologist and Surgeon  Anesthesia Plan Comments:         Anesthesia Quick Evaluation

## 2018-07-02 NOTE — H&P (Signed)
Kristi [name] is an 42 y.o. female.   Chief Complaint: breast cancer HPI: 23 yof referred by Kristi [name] for new left breast cancer earlier this year. she has fh of breast cancer in maternal great aunt who was elderly and in a maternal aunt who had ovarian cancer at around 32. no testing prior. she has no dc. she felt mass in uoq in december that recently has enlarged. this was evaluated by mm. she has c density breasts. there is 22 mm mass in uoq of left breast (right is normal) on mm that on [place] measures 23x18x17 mm. there are abnormal nodes that are not quantified. US biopsy of node and tumor were done. 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 it is er/pr pos, her 2 equivocal but negative by FISH. this is grade III. she has negative genetic testing. she had mri that showed a 2.4x3.2x4 cm mass in uoq of left breast. she has four abnl nodes on mri. she has completed ac and has only three cycles of taxol. she is doing great. due to see Dr Iran Planas today also. has surgery scheduled.  MRI post chemo is IMPRESSION: 1. Significantly improved appearance of known LEFT breast cancer following neoadjuvant treatment. Non mass enhancement measures maximum of 1.4 centimeters on today's exam. 2. Interval resolution of LEFT axillary adenopathy. 3. No new suspicious findings   Past Medical History:  Diagnosis Date  . Breast cancer, left ([place]) 12/2017  . Endometriosis   . Family history of breast cancer   . Family history of ovarian cancer   . Family history of pancreatic cancer   . Family history of prostate cancer   . History of chemotherapy    finished chemo 06/18/2018  . Migraines   . PONV (postoperative nausea and vomiting)     Past Surgical History:  Procedure Laterality Date  . NASAL POLYP EXCISION    . OVARIAN CYST REMOVAL    . PORTACATH PLACEMENT Right 01/15/2018   Procedure: INSERTION PORT-A-CATH WITH [place];  Surgeon: Rolm Bookbinder,  MD;  Location: [place];  Service: General;  Laterality: Right;  . WISDOM TOOTH EXTRACTION      Family History  Problem Relation Age of Onset  . Prostate cancer Father 52       Stage 1  . Ovarian cancer Maternal Aunt 19       ? Germ cell?  . Prostate cancer Maternal Uncle 65       prostectomy  . Pancreatic cancer Maternal Grandmother 35  . Colon cancer Maternal Grandfather 36  . Heart attack Paternal Grandmother   . Prostate cancer Paternal Grandfather        dx in his 39s, d. 23s-90s  . Breast cancer Other        MGMs sister dx over 28   Social History:  reports that she has never smoked. She has never used smokeless tobacco. She reports that she drinks alcohol. She reports that she does not use drugs.  Allergies:  Allergies  Allergen Reactions  . Sulfa Antibiotics Rash and Other (See Comments)    SEVERE HEADACHE    Medications Prior to Admission  Medication Sig Dispense Refill  . baclofen (LIORESAL) 10 MG tablet Take 10 mg by mouth 2 (two) times daily.     Marland Kitchen buPROPion (WELLBUTRIN) 75 MG tablet Take 75 mg by mouth 2 (two) times daily.    . Calcium Carbonate-Vitamin D3 (CALCIUM 600-D) 600-400 MG-UNIT TABS     .  EMGALITY 120 MG/ML SOAJ 120 mg every 30 (thirty) days.    Marland Kitchen gabapentin (NEURONTIN) 300 MG capsule Take 1 capsule (300 mg total) by mouth at bedtime. 90 capsule 3  . Goserelin Acetate (ZOLADEX [place]) Inject into the skin.    Marland Kitchen LORazepam (ATIVAN) 0.5 MG tablet TAKE 1 TABLET BY MOUTH EVERY 8 HOURS AS NEEDED FOR ANXIETY 30 tablet 1  . Magnesium 500 MG CAPS Take 500 mg by mouth 2 (two) times daily.    . Multiple Vitamins-Minerals (HAIR SKIN AND NAILS FORMULA PO) Take by mouth.    . riboflavin (VITAMIN B-2) 100 MG TABS tablet Take 400 mg by mouth 2 (two) times daily.    Marland Kitchen azithromycin (ZITHROMAX Z-PAK) 250 MG tablet Take 2 tablets on day one, then 1 Tablet X 4 days. 6 each 0  . eletriptan (RELPAX) 40 MG tablet TAKE ONE TABLET BY MOUTH AT ONSET OF MIGRAINE. IF  SYMPTOMS PERSIST A SECOND DOSE MAY BE TAKEN IN 2 HOURS. DO NOT EXCEED 2 DOSES IN A 24 HOUR  3  . ondansetron (ZOFRAN) 8 MG tablet TAKE 1 TABLET BY MOUTH TWICE DAILY AS NEEDED START ON THE 3RD DAY AFTER CHEMOTHERAPY      No results found for this or any previous visit (from the past 55 hour(s)). No results found.  ROS Negative  Blood pressure 125/89, pulse 100, temperature 98.1 F (36.7 C), temperature source Oral, resp. rate (!) 22, height 5\' 5"  (1.651 m), weight 66.4 kg, SpO2 100 %. Physical Exam  cv rrr Lungs clear Ab soft nt Physical Exam Rolm Bookbinder MD; 05/30/2018 2:58 PM) General Mental Status-Alert. Orientation-Oriented X3. Breast Nipples-No Discharge. Breast Lump-No Palpable Breast Mass. Lymphatic Head & Neck General Head & Neck Lymphatics: Bilateral - Description - Normal. Axillary General Axillary Region: Bilateral - Description - Normal. Note: no [place] adenopathy  Assessment/Plan Left nsm, left tad , right risk reducing nsm  Rolm Bookbinder, MD 07/02/2018, 7:15 AM

## 2018-07-02 NOTE — Op Note (Signed)
Operative Note   DATE OF OPERATION: 12.9.2019  LOCATION: [facility]-observation  SURGICAL DIVISION: Plastic Surgery  PREOPERATIVE DIAGNOSES:  1. Left breast cancer UOQ ER+ 2. Neoadjuvant chemotherapy  POSTOPERATIVE DIAGNOSES:  same  PROCEDURE:  1. Bilateral breast reconstruction with tissue expanders 2. Acellular dermis (Alloderm) for breast reconstruction 600 cm2  SURGEON: Irene Limbo MD MBA  ASSISTANT: none  ANESTHESIA:  General.   EBL: 175 ml for entire procedure  COMPLICATIONS: None immediate.   INDICATIONS FOR PROCEDURE:  The patient, Kristi [name], is a 42 y.o. female born on [birth year], is here for bilateral immediate prepectoral expander based reconstruction following bilateral nipple sparing mastectomies.   FINDINGS: Natrelle 133S FV-12-T 400 ml tissue expanders placed bilateral, initial fill volume 350 ml air. RIGHT SN [id number] LEFT [id number]  DESCRIPTION OF PROCEDURE:  The patient was marked with the patient in the preoperative area to mark sternal notch, chest midline, anterior axillary lines and inframammary folds.Following induction,Foley catheter placed. The patient's operative site was prepped and draped in a sterile fashion. A time out was performed and all information was confirmed to be correct. I assisted in mastectomies in retraction. Following completion of mastectomies, reconstruction began onrightside.  The cavity was irrigated with solution containing Ancef, gentamicin, and bacitracin. Hemostasis was ensured. A 19 Fr drain was placed in subcutaneous position laterally and a 15 Fr drain placed along inframammary fold. Each secured to skin with 2-0 nylon. Cavity irrigated with Betadine. The tissue expanders were prepared on back table prior in insertion. The expander was filled with air to3100ml. Perforated acellular dermis wasdraped over anterior surface expander. The ADM was then secured to itself over posterior surface of expander with  4-0 chromic. Redundant folds acellular dermis excised so that the ADM layflat without folds over air filled expander.The expander was secured tofascia over lateral sternal borderwith a 0 vicryl. Thelateral tab wasalso secured to pectoralis muscle with 0-vicryl. The ADM was secured to pectoralis muscle and chest wall along inferior border at inframammary foldwith 0 V-lock suture.Laterally the mastectomy flap over posterior axillary line was advanced anteriorly and the subcutaneous tissue and superficial fascia was secured to pectoralis muscle and acellular dermis with 0-vicryl. Skin closure completedwith 3-0 vicryl in fascial layer and 4-0 vicryl in dermis. Skin closure completed with 4-0 monocryl subcuticular and tissue adhesive. Small perforation of nipple incurred during mastectomy dissection and this was repaired primarily with interrupted 4-0 monocryl.  I then directed my attention toleftchest where similar irrigation and drain placement completed. The prepared expander with ADM secured over anterior surface was placed in right chest and tabs secured to chest wall and pectoralis muscle with 0- vicryl suture. The acellular dermis at inframammary fold was secured to chest wall with 0 V-lock suture.Laterally the mastectomy flap over posterior axillary line was advanced anteriorly and the subcutaneous tissue and superficial fascia was secured to pectoralis muscle and acellular dermis with 0-vicryl. Skin closure completedwith 3-0 vicryl in fascial layer and 4-0 vicryl in dermis. Skin closure completed with 4-0 monocryl subcuticular and tissue adhesive.The mastectomy flaps were redraped so that NAC was symmetric from midline and sternal notch. Tegaderm dressings applied followed bydry dressing,breast binder.  The patient was allowed to wake from anesthesia, extubated and taken to the recovery room in satisfactory condition.   SPECIMENS: none  DRAINS: 15 and 19 Fr JP in right and left  reconstructed breast  Irene Limbo, MD [facility] [phone], pin [phone]

## 2018-07-03 ENCOUNTER — Encounter (HOSPITAL_BASED_OUTPATIENT_CLINIC_OR_DEPARTMENT_OTHER): Payer: Self-pay | Admitting: General Surgery

## 2018-07-03 DIAGNOSIS — C50412 Malignant neoplasm of upper-outer quadrant of left female breast: Secondary | ICD-10-CM | POA: Diagnosis not present

## 2018-07-03 MED ORDER — ONDANSETRON 4 MG PO TBDP
ORAL_TABLET | ORAL | Status: AC
  Filled 2018-07-03: qty 1

## 2018-07-03 NOTE — Discharge Instructions (Signed)
About my Jackson-Pratt Bulb Drain ? ?What is a Jackson-Pratt bulb? ?A Jackson-Pratt is a soft, round device used to collect drainage. It is connected to a long, thin drainage catheter, which is held in place by one or two small stiches near your surgical incision site. When the bulb is squeezed, it forms a vacuum, forcing the drainage to empty into the bulb. ? ?Emptying the Jackson-Pratt bulb- ?To empty the bulb: ?1. Release the plug on the top of the bulb. ?2. Pour the bulb's contents into a measuring container which your nurse will provide. ?3. Record the time emptied and amount of drainage. Empty the drain(s) as often as your     doctor or nurse recommends. ? ?Date                  Time                    Amount (Drain 1)                 Amount (Drain 2) ? ?_____________________________________________________________________ ? ?_____________________________________________________________________ ? ?_____________________________________________________________________ ? ?_____________________________________________________________________ ? ?_____________________________________________________________________ ? ?_____________________________________________________________________ ? ?_____________________________________________________________________ ? ?_____________________________________________________________________ ? ?Squeezing the Jackson-Pratt Bulb- ?To squeeze the bulb: ?1. Make sure the plug at the top of the bulb is open. ?2. Squeeze the bulb tightly in your fist. You will hear air squeezing from the bulb. ?3. Replace the plug while the bulb is squeezed. ?4. Use a safety pin to attach the bulb to your clothing. This will keep the catheter from     pulling at the bulb insertion site. ? ?When to call your doctor- ?Call your doctor if: ?Drain site becomes red, swollen or hot. ?You have a fever greater than 101 degrees F. ?There is oozing at the drain site. ?Drain falls out (apply a guaze bandage  over the drain hole and secure it with tape). ?Drainage increases daily not related to activity patterns. (You will usually have more drainage when you are active than when you are resting.) ?Drainage has a bad odor. ? ? ?Post Anesthesia Home Care Instructions ? ?Activity: ?Get plenty of rest for the remainder of the day. A responsible individual must stay with you for 24 hours following the procedure.  ?For the next 24 hours, DO NOT: ?-Drive a car ?-Operate machinery ?-Drink alcoholic beverages ?-Take any medication unless instructed by your physician ?-Make any legal decisions or sign important papers. ? ?Meals: ?Start with liquid foods such as gelatin or soup. Progress to regular foods as tolerated. Avoid greasy, spicy, heavy foods. If nausea and/or vomiting occur, drink only clear liquids until the nausea and/or vomiting subsides. Call your physician if vomiting continues. ? ?Special Instructions/Symptoms: ?Your throat may feel dry or sore from the anesthesia or the breathing tube placed in your throat during surgery. If this causes discomfort, gargle with warm salt water. The discomfort should disappear within 24 hours. ? ?If you had a scopolamine patch placed behind your ear for the management of post- operative nausea and/or vomiting: ? ?1. The medication in the patch is effective for 72 hours, after which it should be removed.  Wrap patch in a tissue and discard in the trash. Wash hands thoroughly with soap and water. ?2. You may remove the patch earlier than 72 hours if you experience unpleasant side effects which may include dry mouth, dizziness or visual disturbances. ?3. Avoid touching the patch. Wash your hands with soap and water after contact with the patch. ? ? ?    ?

## 2018-07-03 NOTE — Discharge Summary (Signed)
Physician Discharge Summary  Patient ID: Kristi [name] MRN: [id number] DOB/AGE: 40-May-1977 40 y.o.  Admit date: 07/02/2018 Discharge date: 07/03/2018  Admission Diagnoses: Left breast cancer  Discharge Diagnoses:  Active Problems:   Breast cancer, left [facility])  Discharged Condition: stable  Hospital Course: Post operatively patient did well with controlled pain, ambulating with minimal assist, tolerating diet.   Treatments: surgery: bilateral nipple sparing mastectomies left sentinel node biopsy bilateral breast reconstruction with tissue expanders acellular dermis 12.9.2019  Discharge Exam: Blood pressure 113/74, pulse 95, temperature 98.6 F (37 C), resp. rate 16, height 5\' 5"  (1.651 m), weight 66.4 kg, SpO2 100 %. Incision/Wound: chest soft Tegaderms in place drains serosanguinous NAC viable  Disposition: Discharge disposition: 01-Home or Self Care       Discharge Instructions    Call MD for:  redness, tenderness, or signs of infection (pain, swelling, bleeding, redness, odor or green/yellow discharge around incision site)   Complete by:  As directed    Call MD for:  temperature >100.5   Complete by:  As directed    Discharge instructions   Complete by:  As directed    Ok to remove dressings and shower am 12.11.19. Soap and water ok, pat tegaderms dry. Do not remove Tegaderms. No creams or ointments over incisions. Do not let drains dangle in shower, attach to lanyard or similar.Strip and record drains twice daily and bring log to clinic visit.  Breast binder or soft compression bra all other times.  Ok to raise arms above shoulders for bathing and dressing.  No house yard work or exercise until cleared by MD.   Madaline Brilliant to use neurontin and Baclofen (home meds) to aid with pain control. Recommend ibuprofen with meals to aid with pain control. Also ok to use Tylenol. Recommend Miralax or Dulcolax as directed for constipation.   Discharge patient   Complete by:  As  directed    Discharge disposition:  01-Home or Self Care   Discharge patient date:  07/03/2018   Driving Restrictions   Complete by:  As directed    No driving for 2 weeks then no driving if taking narcotics   Lifting restrictions   Complete by:  As directed    No lifting > 5 lbs until cleared by MD   Resume previous diet   Complete by:  As directed      Allergies as of 07/03/2018      Reactions   Sulfa Antibiotics Rash, Other (See Comments)   SEVERE HEADACHE      Medication List    STOP taking these medications   azithromycin 250 MG tablet Commonly known as:  ZITHROMAX     TAKE these medications   baclofen 10 MG tablet Commonly known as:  LIORESAL Take 10 mg by mouth 2 (two) times daily.   buPROPion 75 MG tablet Commonly known as:  WELLBUTRIN Take 75 mg by mouth 2 (two) times daily.   CALCIUM 600-D 600-400 MG-UNIT Tabs Generic drug:  Calcium Carbonate-Vitamin D3   doxycycline 50 MG capsule Commonly known as:  VIBRAMYCIN Take 1 capsule (50 mg total) by mouth 2 (two) times daily.   eletriptan 40 MG tablet Commonly known as:  RELPAX TAKE ONE TABLET BY MOUTH AT ONSET OF MIGRAINE. IF SYMPTOMS PERSIST A SECOND DOSE MAY BE TAKEN IN 2 HOURS. DO NOT EXCEED 2 DOSES IN A 24 HOUR   EMGALITY 120 MG/ML [place] Generic drug:  Galcanezumab-gnlm 120 mg every 30 (thirty) days.   gabapentin 300 MG  capsule Commonly known as:  NEURONTIN Take 1 capsule (300 mg total) by mouth at bedtime.   HAIR SKIN AND NAILS FORMULA PO Take by mouth.   LORazepam 0.5 MG tablet Commonly known as:  ATIVAN TAKE 1 TABLET BY MOUTH EVERY 8 HOURS AS NEEDED FOR ANXIETY   Magnesium 500 MG Caps Take 500 mg by mouth 2 (two) times daily.   ondansetron 8 MG tablet Commonly known as:  ZOFRAN TAKE 1 TABLET BY MOUTH TWICE DAILY AS NEEDED START ON THE 3RD DAY AFTER CHEMOTHERAPY   oxyCODONE 5 MG immediate release tablet Commonly known as:  Oxy IR/ROXICODONE Take 1-2 tablets (5-10 mg total) by mouth every  4 (four) hours as needed.   riboflavin 100 MG Tabs tablet Commonly known as:  VITAMIN B-2 Take 400 mg by mouth 2 (two) times daily.   ZOLADEX [place] Inject into the skin.      Follow-up Information    Rolm Bookbinder, MD In 2 weeks.   Specialty:  General Surgery Contact information: [address] [place] [address] [phone]        Irene Limbo, MD In 1 week.   Specialty:  Plastic Surgery Why:  as scheduled Contact information: [place] [address] [place] [address] [phone]           Signed: Irene Limbo 07/03/2018, 7:07 AM

## 2018-07-04 NOTE — Anesthesia Postprocedure Evaluation (Signed)
Anesthesia Post Note  Patient: Kristi [name]  Procedure(s) Performed: BILATERAL NIPPLE SPARING MASTECTOMIES WITH LEFT AXILLARY SENTINEL LYMPH NODE BIOPSY AND LEFT SEED GUIDED NODE EXCISION (Bilateral Breast) PORTA CATH REMOVAL (Right Chest) BILATERAL BREAST RECONSTRUCTION WITH PLACEMENT OF TISSUE EXPANDER AND ACELLULAR DERMIS (Bilateral Breast)     Patient location during evaluation: PACU Anesthesia Type: General Level of consciousness: awake and alert Pain management: pain level controlled Vital Signs Assessment: post-procedure vital signs reviewed and stable Respiratory status: spontaneous breathing, nonlabored ventilation, respiratory function stable and patient connected to nasal cannula oxygen Cardiovascular status: blood pressure returned to baseline and stable Postop Assessment: no apparent nausea or vomiting Anesthetic complications: no    Last Vitals:  Vitals:   07/03/18 0530 07/03/18 0700  BP: 113/74   Pulse: 95   Resp: 16   Temp: 37 C   SpO2: 100% 100%    Last Pain:  Vitals:   07/03/18 0745  TempSrc:   PainSc: [place]

## 2018-07-05 ENCOUNTER — Other Ambulatory Visit: Payer: Self-pay | Admitting: General Surgery

## 2018-07-09 ENCOUNTER — Encounter: Payer: Self-pay | Admitting: Hematology and Oncology

## 2018-07-09 ENCOUNTER — Inpatient Hospital Stay: Payer: Commercial Managed Care - PPO | Admitting: Hematology and Oncology

## 2018-07-09 ENCOUNTER — Inpatient Hospital Stay: Payer: Commercial Managed Care - PPO

## 2018-07-09 ENCOUNTER — Inpatient Hospital Stay: Payer: Commercial Managed Care - PPO | Attending: Hematology and Oncology | Admitting: Hematology and Oncology

## 2018-07-09 ENCOUNTER — Other Ambulatory Visit: Payer: Self-pay

## 2018-07-09 DIAGNOSIS — R112 Nausea with vomiting, unspecified: Secondary | ICD-10-CM

## 2018-07-09 DIAGNOSIS — Z9013 Acquired absence of bilateral breasts and nipples: Secondary | ICD-10-CM | POA: Insufficient documentation

## 2018-07-09 DIAGNOSIS — E860 Dehydration: Secondary | ICD-10-CM

## 2018-07-09 DIAGNOSIS — Z9221 Personal history of antineoplastic chemotherapy: Secondary | ICD-10-CM

## 2018-07-09 DIAGNOSIS — C50412 Malignant neoplasm of upper-outer quadrant of left female breast: Secondary | ICD-10-CM

## 2018-07-09 DIAGNOSIS — Z5111 Encounter for antineoplastic chemotherapy: Secondary | ICD-10-CM | POA: Insufficient documentation

## 2018-07-09 DIAGNOSIS — Z170 Estrogen receptor positive status [ER+]: Secondary | ICD-10-CM | POA: Insufficient documentation

## 2018-07-09 DIAGNOSIS — C773 Secondary and unspecified malignant neoplasm of axilla and upper limb lymph nodes: Secondary | ICD-10-CM | POA: Insufficient documentation

## 2018-07-09 MED ORDER — GOSERELIN ACETATE 3.6 MG ~~LOC~~ IMPL
DRUG_IMPLANT | SUBCUTANEOUS | Status: AC
  Filled 2018-07-09: qty 3.6

## 2018-07-09 MED ORDER — ONDANSETRON HCL 4 MG/2ML IJ SOLN
INTRAMUSCULAR | Status: AC
  Filled 2018-07-09: qty 4

## 2018-07-09 MED ORDER — GOSERELIN ACETATE 3.6 MG ~~LOC~~ IMPL
3.6000 mg | DRUG_IMPLANT | Freq: Once | SUBCUTANEOUS | Status: AC
  Administered 2018-07-09: 3.6 mg via SUBCUTANEOUS

## 2018-07-09 MED ORDER — ONDANSETRON HCL 4 MG/2ML IJ SOLN
8.0000 mg | Freq: Once | INTRAMUSCULAR | Status: AC
  Administered 2018-07-09: 8 mg via INTRAVENOUS

## 2018-07-09 MED ORDER — ONDANSETRON HCL 4 MG/2ML IJ SOLN: 8.0000 mg | Freq: Once | INTRAMUSCULAR | Status: DC

## 2018-07-09 MED ORDER — SODIUM CHLORIDE 0.9 % IV SOLN
Freq: Once | INTRAVENOUS | Status: DC
  Filled 2018-07-09: qty 250

## 2018-07-09 MED ORDER — SODIUM CHLORIDE 0.9 % IV SOLN
Freq: Once | INTRAVENOUS | Status: AC
  Administered 2018-07-09: 12:00:00 via INTRAVENOUS
  Filled 2018-07-09: qty 250

## 2018-07-09 NOTE — Progress Notes (Signed)
Patient Care Team: Blair Heys, PA-C as PCP - General (Physician Assistant) Nicholas Lose, MD as Consulting Physician (Hematology and Oncology) Rolm Bookbinder, MD as Consulting Physician (General Surgery) Delice Bison [name], NP as Nurse Practitioner (Hematology and Oncology)  DIAGNOSIS:  Encounter Diagnosis  Name Primary?  . Malignant neoplasm of upper-outer quadrant of left breast in female, estrogen receptor positive ([place])     SUMMARY OF ONCOLOGIC HISTORY:   Malignant neoplasm of upper-outer quadrant of left breast in female, estrogen receptor positive ([place])   01/02/2018 Initial Diagnosis    Palpable lump left breast, mammogram: Left breast UOQ 2.3 cm mass, axilla has several enlarged lymph nodes; 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    01/04/2018 Cancer Staging    Staging form: Breast, AJCC 8th Edition - Clinical: Stage IIB (cT2, cN1, cM0, G3, ER+, PR+, HER2: Equivocal) - Signed by Nicholas Lose, MD on 01/04/2018    01/16/2018 - 06/18/2018 Neo-Adjuvant Chemotherapy    Dose dense Adriamycin and Cytoxan x4 followed by Taxol, switched to Abraxane with cycle 2 after Taxol reaction with palpitations weekly x12    01/19/2018 Genetic Testing    STK11 c.608C>T VUS identified on the Multicancer panel.  The Multi-Gene Panel offered by [facility] includes sequencing and/or deletion duplication testing of the following 83 genes: ALK, APC, ATM, AXIN2,BAP1,  BARD1, BLM, BMPR1A, BRCA1, BRCA2, BRIP1, CASR, CDC73, CDH1, CDK4, CDKN1B, CDKN1C, CDKN2A (p14ARF), CDKN2A (p16INK4a), CEBPA, CHEK2, CTNNA1, DICER1, DIS3L2, EGFR (c.2369C>T, p.Thr790Met variant only), EPCAM (Deletion/duplication testing only), FH, FLCN, GATA2, GPC3, GREM1 (Promoter region deletion/duplication testing only), HOXB13 (c.251G>A, p.Gly84Glu), HRAS, KIT, MAX, MEN1, MET, MITF (c.952G>A, p.Glu318Lys variant only), MLH1,  MSH2, MSH3, MSH6, MUTYH, NBN, NF1, NF2, NTHL1, PALB2, PDGFRA, PHOX2B, PMS2, POLD1, POLE, POT1, PRKAR1A, PTCH1, PTEN, RAD50, RAD51C, RAD51D, RB1, RECQL4, RET, RUNX1, SDHAF2, SDHA (sequence changes only), SDHB, SDHC, SDHD, SMAD4, SMARCA4, SMARCB1, SMARCE1, STK11, SUFU, TERT, TERT, TMEM127, TP53, TSC1, TSC2, VHL, WRN and WT1.  The report date is January 19, 2018.    06/13/2018 Breast MRI    Significant improvement of left breast cancer previously 2.4 x 3.2 x 4 cm, currently 1.4 x 1.3 x 0.8 cm, no lymph nodes    07/02/2018 Surgery    Bilateral mastectomies: Left mastectomy: IDC grade 2, 0.9 cm, margins negative, 1/4 lymph nodes positive with extracapsular extension, margins negative ypT1b ypN1 ER 75% strong, PR 70% strong, HER-2 negative, Ki-67 60%     CHIEF COMPLIANT: Follow-up after bilateral mastectomies to discuss pathology report  INTERVAL HISTORY: Kristi [name] is a 42 year old with above-mentioned history of bilateral mastectomies for left-sided breast cancer.  She feels extremely miserable today with a lot of nausea from narcotics.  She saw Dr. Donne Hazel today morning who changed her pain medication.  She is in a lot of pain and discomfort from recent surgeries.  REVIEW OF SYSTEMS:   Constitutional: Denies fevers, chills or abnormal weight loss Eyes: Denies blurriness of vision Ears, nose, mouth, throat, and face: Denies mucositis or sore throat Respiratory: Denies cough, dyspnea or wheezes Cardiovascular: Denies palpitation, chest discomfort Gastrointestinal: Complains of severe nausea and vomiting Skin: Denies abnormal skin rashes Lymphatics: Denies new lymphadenopathy or easy bruising Neurological:Denies numbness, tingling or new weaknesses Behavioral/Psych: Mood is stable, no new changes  Extremities: No lower extremity edema Breast: Severe pain from recent bilateral mastectomies All other systems were reviewed with the patient and are negative.  I  have reviewed the past  medical history, past surgical history, social history and family history with the patient and they are unchanged from previous note.  ALLERGIES:  is allergic to sulfa antibiotics.  MEDICATIONS:  Current Outpatient Medications  Medication Sig Dispense Refill  . B-2-400 400 MG CAPS Take 400 mg by mouth 2 (two) times daily.    . baclofen (LIORESAL) 10 MG tablet Take 10 mg by mouth 2 (two) times daily as needed (headaches/spasms.).     Marland Kitchen buPROPion (WELLBUTRIN) 75 MG tablet Take 75 mg by mouth 2 (two) times daily.    . Calcium Carbonate-Vitamin D3 (CALCIUM 600-D) 600-400 MG-UNIT TABS Take 1 tablet by mouth daily.     Marland Kitchen doxycycline (VIBRAMYCIN) 50 MG capsule Take 1 capsule (50 mg total) by mouth 2 (two) times daily. 14 capsule 0  . eletriptan (RELPAX) 40 MG tablet Take 40 mg by mouth every 2 (two) hours as needed for migraine (max 2 doses per 24 hrs.).   3  . EMGALITY 120 MG/ML SOAJ Inject 120 mg into the skin every 30 (thirty) days.     . fexofenadine (ALLEGRA) 180 MG tablet Take 180 mg by mouth daily.    Marland Kitchen gabapentin (NEURONTIN) 300 MG capsule Take 1 capsule (300 mg total) by mouth at bedtime. 90 capsule 3  . Goserelin Acetate (ZOLADEX [place]) Inject 1 Dose into the skin every 30 (thirty) days.     Marland Kitchen LORazepam (ATIVAN) 0.5 MG tablet TAKE 1 TABLET BY MOUTH EVERY 8 HOURS AS NEEDED FOR ANXIETY (Patient taking differently: Take 0.5 mg by mouth at bedtime. ) 30 tablet 1  . Magnesium 500 MG CAPS Take 500 mg by mouth 2 (two) times daily.    . Multiple Vitamin (MULTIVITAMIN WITH MINERALS) TABS tablet Take 1 tablet by mouth daily. Women's One A Day Multivitamin    . Multiple Vitamins-Minerals (HAIR SKIN AND NAILS FORMULA PO) Take 1 tablet by mouth daily.     . ondansetron (ZOFRAN) 8 MG tablet Take 8 mg by mouth 2 (two) times daily as needed for nausea or vomiting.     Marland Kitchen oxyCODONE (ROXICODONE) 5 MG immediate release tablet Take 1-2 tablets (5-10 mg total) by mouth every 4 (four) hours as needed. (Patient taking  differently: Take 5-10 mg by mouth every 4 (four) hours as needed (pain.). ) 40 tablet 0  . prochlorperazine (COMPAZINE) 10 MG tablet Take 10 mg by mouth every 6 (six) hours as needed for nausea or vomiting.    . Soft Lens Products (REWETTING DROPS) SOLN Place 1 drop into both eyes 3 (three) times daily as needed (contact lenses.).     No current facility-administered medications for this visit.    Facility-Administered Medications Ordered in Other Visits  Medication Dose Route Frequency Provider Last Rate Last Dose  . sodium chloride flush (NS) 0.9 % injection 10 mL  10 mL Intravenous PRN Nicholas Lose, MD   10 mL at 04/13/18 1529    PHYSICAL EXAMINATION: ECOG PERFORMANCE STATUS: 1 - Symptomatic but completely ambulatory  Vitals:   07/09/18 1039  BP: 119/83  Pulse: (!) 120  Resp: 18  Temp: (!) 97.5 F (36.4 C)  SpO2: 100%   Filed Weights   07/09/18 1039  Weight: 144 lb 3.2 oz (65.4 kg)    GENERAL:alert, no distress and comfortable SKIN: skin color, texture, turgor are normal, no rashes or significant lesions EYES: normal, Conjunctiva are pink and non-injected, sclera clear OROPHARYNX:no exudate, no erythema and lips, buccal mucosa, and tongue  normal  NECK: supple, thyroid normal size, non-tender, without nodularity LYMPH:  no palpable lymphadenopathy in the cervical, axillary or inguinal LUNGS: clear to auscultation and percussion with normal breathing effort HEART: regular rate & rhythm and no murmurs and no lower extremity edema ABDOMEN:abdomen soft, non-tender and normal bowel sounds MUSCULOSKELETAL:no cyanosis of digits and no clubbing  NEURO: alert & oriented x 3 with fluent speech, no focal motor/sensory deficits EXTREMITIES: No lower extremity edema  LABORATORY DATA:  I have reviewed the data as listed CMP Latest Ref Rng & Units 06/18/2018 06/15/2018 06/08/2018  Glucose 70 - 99 mg/dL 103(H) 82 99  BUN 6 - 20 mg/dL _0 Creatinine 0.44 - 1.00 mg/dL 0.76 0.81  0.77  Sodium 135 - 145 mmol/L 142 141 140  Potassium 3.5 - 5.1 mmol/L 3.7 4.0 3.8  Chloride 98 - 111 mmol/L 106 106 105  CO2 22 - 32 mmol/L _1 Calcium 8.9 - 10.3 mg/dL 9.6 9.7 9.9  Total Protein 6.5 - 8.1 g/dL 7.1 7.4 7.1  Total Bilirubin 0.3 - 1.2 mg/dL 0.3 0.3 0.2(L)  Alkaline Phos 38 - 126 U/L 79 89 83  AST 15 - 41 U/L 33 47(H) 30  ALT 0 - 44 U/L 93(H) 121(H) 73(H)    Lab Results  Component Value Date   WBC 5.0 06/18/2018   HGB 12.9 06/18/2018   HCT 39.3 06/18/2018   MCV 92.3 06/18/2018   PLT 358 06/18/2018   NEUTROABS 2.9 06/18/2018    ASSESSMENT & PLAN:  Malignant neoplasm of upper-outer quadrant of left breast in female, estrogen receptor positive ([place]) 01/02/2018: Palpable lump left breastsince December 2018, mammogram: Left breast UOQ 2.3 cm mass, axilla has several enlarged lymph nodes; 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  Status post neoadjuvant chemotherapy with dose dense Adriamycin Cytoxan x4 followed by Abraxane weekly x12  07/02/2018:Bilateral mastectomies: Left mastectomy: IDC grade 2, 0.9 cm, margins negative, 1/4 lymph nodes positive with extracapsular extension, margins negative ypT1b ypN1 ER 75% strong, PR 10% strong, HER-2 negative, Ki-67 60%  Pathology counseling: I discussed the final pathology report of the patient provided  a copy of this report. I discussed the margins as well as lymph node surgeries. We also discussed the final staging along with previously performed ER/PR and HER-2/neu testing.  Recommendation: 1.  Discussion in tumor board regarding axillary lymph node dissection 2.  Adjuvant radiation therapy 3.  Followed by adjuvant antiestrogen therapy Consideration for clinical trial [name]  Severe nausea and vomiting: We will give her IV fluids and Zofran today. This is likely due to narcotic pain medications.  Dr. Donne Hazel  switched her narcotic pain medicine so hopefully she will feel better.  Return to clinic after radiation therapy to discuss antiestrogen therapy treatment options.     No orders of the defined types were placed in this encounter.  The patient has a good understanding of the overall plan. she agrees with it. she will call with any problems that may develop before the next visit here.   Harriette Ohara, MD 07/09/18

## 2018-07-09 NOTE — Progress Notes (Signed)
Pt given 1hr IVF and IV zofran per MD Gudena for nausea/dehydration.  Pt tolerated well.  A&Ox4.  Ambulatory w/steady gait.  Reports feeling better when medications finished.  Verbalized understanding to continue oral fluids and antiemetics at home and to call back with any issues/concerns.

## 2018-07-09 NOTE — Assessment & Plan Note (Signed)
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  Status post neoadjuvant chemotherapy with dose dense Adriamycin Cytoxan x4 followed by Abraxane weekly x12  07/02/2018:Bilateral mastectomies: Left mastectomy: IDC grade 2, 0.9 cm, margins negative, 1/4 lymph nodes positive with extracapsular extension, margins negative ypT1b ypN1 ER 75% strong, PR 10% strong, HER-2 negative, Ki-67 60%  Pathology counseling: I discussed the final pathology report of the patient provided  a copy of this report. I discussed the margins as well as lymph node surgeries. We also discussed the final staging along with previously performed ER/PR and HER-2/neu testing.  Recommendation: 1.  Discussion tumor board regarding axillary lymph node dissection 2.  Adjuvant radiation therapy 3.  Followed by adjuvant antiestrogen therapy Consideration for clinical trial [name]  Return to clinic after radiation therapy to discuss antiestrogen therapy treatment options.

## 2018-07-09 NOTE — Patient Instructions (Signed)

## 2018-07-10 ENCOUNTER — Telehealth: Payer: Self-pay | Admitting: Hematology and Oncology

## 2018-07-10 ENCOUNTER — Ambulatory Visit: Payer: Commercial Managed Care - PPO

## 2018-07-10 NOTE — Telephone Encounter (Signed)
No 07/09/18 los.  °

## 2018-07-10 NOTE — Telephone Encounter (Signed)
Per 12/16 no los °

## 2018-07-11 ENCOUNTER — Telehealth: Payer: Self-pay | Admitting: *Deleted

## 2018-07-11 NOTE — Telephone Encounter (Signed)
Called to check in on pt to assess needs and nausea. Pt relate she is doing "much better" since she received phenergan from Dr. Donne Hazel. Denies needs or questions at this time. Encourage pt to call with any needs or symptoms. Received verbal understanding.

## 2018-07-23 ENCOUNTER — Other Ambulatory Visit: Payer: Self-pay

## 2018-07-23 ENCOUNTER — Encounter (HOSPITAL_COMMUNITY): Payer: Self-pay | Admitting: *Deleted

## 2018-07-23 NOTE — Progress Notes (Signed)
Pt denies SOB, chest pain, and being under the care of a cardiologist. Pt denies having a stress test and cardiac cath. Pt denies recent labs. Pt made aware to stop taking  Aspirin, vitamins, fish oil and herbal medications. Do not take any NSAIDs ie: Ibuprofen, Advil, Naproxen (Aleve), Motrin, BC and Goody Powder. Pt verbalized understanding of all pre-op instructions.

## 2018-07-24 ENCOUNTER — Observation Stay (HOSPITAL_COMMUNITY)
Admission: RE | Admit: 2018-07-24 | Discharge: 2018-07-25 | Disposition: A | Discharge status: Home / Self Care | Payer: Commercial Managed Care - PPO | Attending: General Surgery | Admitting: General Surgery

## 2018-07-24 ENCOUNTER — Ambulatory Visit (HOSPITAL_COMMUNITY): Payer: Commercial Managed Care - PPO | Admitting: Certified Registered"

## 2018-07-24 ENCOUNTER — Encounter (HOSPITAL_COMMUNITY): Payer: Self-pay | Admitting: *Deleted

## 2018-07-24 ENCOUNTER — Other Ambulatory Visit: Payer: Self-pay

## 2018-07-24 ENCOUNTER — Encounter (HOSPITAL_COMMUNITY)
Admission: RE | Disposition: A | Discharge status: Home / Self Care | Payer: Self-pay | Source: Home / Self Care | Attending: General Surgery

## 2018-07-24 DIAGNOSIS — Z8041 Family history of malignant neoplasm of ovary: Secondary | ICD-10-CM | POA: Diagnosis not present

## 2018-07-24 DIAGNOSIS — Z853 Personal history of malignant neoplasm of breast: Secondary | ICD-10-CM | POA: Diagnosis not present

## 2018-07-24 DIAGNOSIS — Z882 Allergy status to sulfonamides status: Secondary | ICD-10-CM | POA: Insufficient documentation

## 2018-07-24 DIAGNOSIS — Z79899 Other long term (current) drug therapy: Secondary | ICD-10-CM | POA: Diagnosis not present

## 2018-07-24 DIAGNOSIS — C773 Secondary and unspecified malignant neoplasm of axilla and upper limb lymph nodes: Principal | ICD-10-CM | POA: Insufficient documentation

## 2018-07-24 DIAGNOSIS — Z9221 Personal history of antineoplastic chemotherapy: Secondary | ICD-10-CM | POA: Diagnosis not present

## 2018-07-24 DIAGNOSIS — G43909 Migraine, unspecified, not intractable, without status migrainosus: Secondary | ICD-10-CM | POA: Insufficient documentation

## 2018-07-24 DIAGNOSIS — C50912 Malignant neoplasm of unspecified site of left female breast: Secondary | ICD-10-CM | POA: Diagnosis present

## 2018-07-24 DIAGNOSIS — Z9012 Acquired absence of left breast and nipple: Secondary | ICD-10-CM | POA: Insufficient documentation

## 2018-07-24 DIAGNOSIS — Z9011 Acquired absence of right breast and nipple: Secondary | ICD-10-CM | POA: Insufficient documentation

## 2018-07-24 DIAGNOSIS — Z803 Family history of malignant neoplasm of breast: Secondary | ICD-10-CM | POA: Diagnosis not present

## 2018-07-24 HISTORY — PX: AXILLARY LYMPH NODE DISSECTION: SHX5229

## 2018-07-24 HISTORY — DX: Presence of spectacles and contact lenses: Z97.3

## 2018-07-24 LAB — BASIC METABOLIC PANEL
Anion gap: 11 (ref 5–15)
BUN: 8 mg/dL (ref 6–20)
CO2: 26 mmol/L (ref 22–32)
Calcium: 9.5 mg/dL (ref 8.9–10.3)
Chloride: 105 mmol/L (ref 98–111)
Creatinine, Ser: 0.74 mg/dL (ref 0.44–1.00)
GFR calc Af Amer: >60 mL/min (ref >60)
GFR calc non Af Amer: >60 mL/min (ref >60)
Glucose, Bld: 72 mg/dL (ref 70–99)
Potassium: 3.5 mmol/L (ref 3.5–5.1)
Sodium: 142 mmol/L (ref 135–145)

## 2018-07-24 LAB — CBC
HCT: 41.2 % (ref 36.0–46.0)
Hemoglobin: 13.4 g/dL (ref 12.0–15.0)
MCH: 30.1 pg (ref 26.0–34.0)
MCHC: 32.5 g/dL (ref 30.0–36.0)
MCV: 92.6 fL (ref 80.0–100.0)
Platelets: 353 10*3/uL (ref 150–400)
RBC: 4.45 MIL/uL (ref 3.87–5.11)
RDW: 14 % (ref 11.5–15.5)
WBC: 7.5 10*3/uL (ref 4.0–10.5)
nRBC: 0.4 % — ABNORMAL HIGH (ref 0.0–0.2)

## 2018-07-24 LAB — POCT PREGNANCY, URINE: Preg Test, Ur: NEGATIVE

## 2018-07-24 SURGERY — LYMPHADENECTOMY, AXILLARY
Anesthesia: General | Site: Axilla | Laterality: Left

## 2018-07-24 MED ORDER — MORPHINE SULFATE (PF) 2 MG/ML IV SOLN
INTRAVENOUS | Status: AC
  Filled 2018-07-24: qty 1

## 2018-07-24 MED ORDER — FENTANYL CITRATE (PF) 250 MCG/5ML IJ SOLN
INTRAMUSCULAR | Status: DC | PRN
  Administered 2018-07-24: 50 ug via INTRAVENOUS
  Administered 2018-07-24: 100 ug via INTRAVENOUS
  Administered 2018-07-24 (×2): 50 ug via INTRAVENOUS

## 2018-07-24 MED ORDER — SIMETHICONE 80 MG PO CHEW: 40.0000 mg | CHEWABLE_TABLET | Freq: Four times a day (QID) | ORAL | Status: DC | PRN

## 2018-07-24 MED ORDER — PROCHLORPERAZINE MALEATE 10 MG PO TABS
10.0000 mg | ORAL_TABLET | Freq: Four times a day (QID) | ORAL | Status: DC | PRN
  Filled 2018-07-24: qty 1

## 2018-07-24 MED ORDER — FENTANYL CITRATE (PF) 100 MCG/2ML IJ SOLN
INTRAMUSCULAR | Status: AC
  Filled 2018-07-24: qty 2

## 2018-07-24 MED ORDER — DIPHENHYDRAMINE HCL 50 MG/ML IJ SOLN
INTRAMUSCULAR | Status: DC | PRN
  Administered 2018-07-24: 25 mg via INTRAVENOUS

## 2018-07-24 MED ORDER — SODIUM CHLORIDE 0.9 % IV SOLN
INTRAVENOUS | Status: DC
  Administered 2018-07-24: 18:00:00 via INTRAVENOUS

## 2018-07-24 MED ORDER — GABAPENTIN 300 MG PO CAPS
ORAL_CAPSULE | ORAL | Status: AC
  Administered 2018-07-24: 300 mg via ORAL
  Filled 2018-07-24: qty 1

## 2018-07-24 MED ORDER — FENTANYL CITRATE (PF) 100 MCG/2ML IJ SOLN
25.0000 ug | INTRAMUSCULAR | Status: DC | PRN
  Administered 2018-07-24 (×2): 50 ug via INTRAVENOUS

## 2018-07-24 MED ORDER — BACLOFEN 10 MG PO TABS: 10.0000 mg | ORAL_TABLET | Freq: Two times a day (BID) | ORAL | Status: DC | PRN

## 2018-07-24 MED ORDER — KETOROLAC TROMETHAMINE 15 MG/ML IJ SOLN
15.0000 mg | INTRAMUSCULAR | Status: AC
  Administered 2018-07-24: 15 mg via INTRAVENOUS

## 2018-07-24 MED ORDER — BUPIVACAINE-EPINEPHRINE 0.25% -1:200000 IJ SOLN
INTRAMUSCULAR | Status: DC | PRN
  Administered 2018-07-24: .1 mL

## 2018-07-24 MED ORDER — PROMETHAZINE HCL 25 MG/ML IJ SOLN: 6.2500 mg | INTRAMUSCULAR | Status: DC | PRN

## 2018-07-24 MED ORDER — PROPOFOL 500 MG/50ML IV EMUL
INTRAVENOUS | Status: AC
  Filled 2018-07-24: qty 50

## 2018-07-24 MED ORDER — ENSURE PRE-SURGERY PO LIQD
296.0000 mL | Freq: Once | ORAL | Status: DC
  Filled 2018-07-24: qty 296

## 2018-07-24 MED ORDER — FENTANYL CITRATE (PF) 100 MCG/2ML IJ SOLN
50.0000 ug | Freq: Once | INTRAMUSCULAR | Status: AC
  Administered 2018-07-24: 50 ug via INTRAVENOUS

## 2018-07-24 MED ORDER — LIDOCAINE 2% (20 MG/ML) 5 ML SYRINGE
INTRAMUSCULAR | Status: DC | PRN
  Administered 2018-07-24: 20 mg via INTRAVENOUS

## 2018-07-24 MED ORDER — FENTANYL CITRATE (PF) 100 MCG/2ML IJ SOLN
INTRAMUSCULAR | Status: AC
  Administered 2018-07-24: 50 ug via INTRAVENOUS
  Filled 2018-07-24: qty 2

## 2018-07-24 MED ORDER — PROPOFOL 1000 MG/100ML IV EMUL
INTRAVENOUS | Status: AC
  Filled 2018-07-24: qty 200

## 2018-07-24 MED ORDER — GABAPENTIN 300 MG PO CAPS
300.0000 mg | ORAL_CAPSULE | Freq: Every day | ORAL | Status: DC
  Administered 2018-07-24: 300 mg via ORAL
  Filled 2018-07-24: qty 1

## 2018-07-24 MED ORDER — GABAPENTIN 100 MG PO CAPS
100.0000 mg | ORAL_CAPSULE | ORAL | Status: AC
  Administered 2018-07-24: 300 mg via ORAL

## 2018-07-24 MED ORDER — MORPHINE SULFATE (PF) 2 MG/ML IV SOLN
2.0000 mg | INTRAVENOUS | Status: DC | PRN
  Administered 2018-07-24: 2 mg via INTRAVENOUS

## 2018-07-24 MED ORDER — KETOROLAC TROMETHAMINE 15 MG/ML IJ SOLN
INTRAMUSCULAR | Status: AC
  Administered 2018-07-24: 15 mg via INTRAVENOUS
  Filled 2018-07-24: qty 1

## 2018-07-24 MED ORDER — DEXAMETHASONE SODIUM PHOSPHATE 10 MG/ML IJ SOLN
INTRAMUSCULAR | Status: AC
  Filled 2018-07-24: qty 1

## 2018-07-24 MED ORDER — BUPROPION HCL 75 MG PO TABS
75.0000 mg | ORAL_TABLET | Freq: Two times a day (BID) | ORAL | Status: DC
  Administered 2018-07-24 – 2018-07-25 (×2): 75 mg via ORAL
  Filled 2018-07-24 (×2): qty 1

## 2018-07-24 MED ORDER — ACETAMINOPHEN 500 MG PO TABS
1000.0000 mg | ORAL_TABLET | Freq: Four times a day (QID) | ORAL | Status: DC
  Administered 2018-07-24 – 2018-07-25 (×3): 1000 mg via ORAL
  Filled 2018-07-24 (×4): qty 2

## 2018-07-24 MED ORDER — PROMETHAZINE HCL 25 MG PO TABS: 25.0000 mg | ORAL_TABLET | Freq: Four times a day (QID) | ORAL | Status: DC | PRN

## 2018-07-24 MED ORDER — MIDAZOLAM HCL 2 MG/2ML IJ SOLN
INTRAMUSCULAR | Status: AC
  Administered 2018-07-24: 2 mg via INTRAVENOUS
  Filled 2018-07-24: qty 2

## 2018-07-24 MED ORDER — CHLORHEXIDINE GLUCONATE CLOTH 2 % EX PADS: 6.0000 | MEDICATED_PAD | Freq: Once | CUTANEOUS | Status: DC

## 2018-07-24 MED ORDER — 0.9 % SODIUM CHLORIDE (POUR BTL) OPTIME
TOPICAL | Status: DC | PRN
  Administered 2018-07-24: 1000 mL

## 2018-07-24 MED ORDER — MIDAZOLAM HCL 2 MG/2ML IJ SOLN
2.0000 mg | Freq: Once | INTRAMUSCULAR | Status: AC
  Administered 2018-07-24: 2 mg via INTRAVENOUS

## 2018-07-24 MED ORDER — FENTANYL CITRATE (PF) 100 MCG/2ML IJ SOLN: 25.0000 ug | Freq: Once | INTRAMUSCULAR | Status: DC

## 2018-07-24 MED ORDER — KETOROLAC TROMETHAMINE 15 MG/ML IJ SOLN
15.0000 mg | Freq: Four times a day (QID) | INTRAMUSCULAR | Status: DC | PRN
  Administered 2018-07-24 – 2018-07-25 (×2): 15 mg via INTRAVENOUS
  Filled 2018-07-24 (×2): qty 1

## 2018-07-24 MED ORDER — METHOCARBAMOL 500 MG PO TABS
ORAL_TABLET | ORAL | Status: AC
  Filled 2018-07-24: qty 1

## 2018-07-24 MED ORDER — SCOPOLAMINE 1 MG/3DAYS TD PT72
MEDICATED_PATCH | TRANSDERMAL | Status: AC
  Filled 2018-07-24: qty 2

## 2018-07-24 MED ORDER — ONDANSETRON HCL 4 MG/2ML IJ SOLN
INTRAMUSCULAR | Status: DC | PRN
  Administered 2018-07-24: 4 mg via INTRAVENOUS

## 2018-07-24 MED ORDER — CLONIDINE HCL (ANALGESIA) 100 MCG/ML EP SOLN
EPIDURAL | Status: DC | PRN
  Administered 2018-07-24: 50 ug

## 2018-07-24 MED ORDER — BUPIVACAINE-EPINEPHRINE (PF) 0.5% -1:200000 IJ SOLN
INTRAMUSCULAR | Status: DC | PRN
  Administered 2018-07-24: 30 mL

## 2018-07-24 MED ORDER — CEFAZOLIN SODIUM-DEXTROSE 2-4 GM/100ML-% IV SOLN
INTRAVENOUS | Status: AC
  Filled 2018-07-24: qty 100

## 2018-07-24 MED ORDER — HEMOSTATIC AGENTS (NO CHARGE) OPTIME
TOPICAL | Status: DC | PRN
  Administered 2018-07-24: 1 via TOPICAL

## 2018-07-24 MED ORDER — FENTANYL CITRATE (PF) 100 MCG/2ML IJ SOLN
25.0000 ug | INTRAMUSCULAR | Status: DC | PRN
  Administered 2018-07-24 (×3): 50 ug via INTRAVENOUS

## 2018-07-24 MED ORDER — HYDROCODONE-ACETAMINOPHEN 10-325 MG PO TABS
1.0000 | ORAL_TABLET | Freq: Four times a day (QID) | ORAL | Status: DC | PRN
  Administered 2018-07-24 – 2018-07-25 (×2): 1 via ORAL
  Filled 2018-07-24 (×2): qty 1

## 2018-07-24 MED ORDER — METHOCARBAMOL 500 MG PO TABS
500.0000 mg | ORAL_TABLET | Freq: Three times a day (TID) | ORAL | Status: DC
  Administered 2018-07-24 – 2018-07-25 (×3): 500 mg via ORAL
  Filled 2018-07-24 (×2): qty 1

## 2018-07-24 MED ORDER — PROCHLORPERAZINE EDISYLATE 10 MG/2ML IJ SOLN: 5.0000 mg | Freq: Four times a day (QID) | INTRAMUSCULAR | Status: DC | PRN

## 2018-07-24 MED ORDER — CEFAZOLIN SODIUM-DEXTROSE 2-4 GM/100ML-% IV SOLN
2.0000 g | INTRAVENOUS | Status: AC
  Administered 2018-07-24: 2 g via INTRAVENOUS

## 2018-07-24 MED ORDER — PROPOFOL 10 MG/ML IV BOLUS
INTRAVENOUS | Status: AC
  Filled 2018-07-24: qty 20

## 2018-07-24 MED ORDER — DEXAMETHASONE SODIUM PHOSPHATE 10 MG/ML IJ SOLN
INTRAMUSCULAR | Status: DC | PRN
  Administered 2018-07-24: 10 mg via INTRAVENOUS

## 2018-07-24 MED ORDER — ELETRIPTAN HYDROBROMIDE 40 MG PO TABS
40.0000 mg | ORAL_TABLET | ORAL | Status: DC | PRN
  Filled 2018-07-24: qty 1

## 2018-07-24 MED ORDER — DIPHENHYDRAMINE HCL 50 MG/ML IJ SOLN
INTRAMUSCULAR | Status: AC
  Filled 2018-07-24: qty 1

## 2018-07-24 MED ORDER — HYDROCODONE-ACETAMINOPHEN 10-325 MG PO TABS
ORAL_TABLET | ORAL | Status: AC
  Filled 2018-07-24: qty 1

## 2018-07-24 MED ORDER — FENTANYL CITRATE (PF) 250 MCG/5ML IJ SOLN
INTRAMUSCULAR | Status: AC
  Filled 2018-07-24: qty 5

## 2018-07-24 MED ORDER — PROPOFOL 500 MG/50ML IV EMUL
INTRAVENOUS | Status: DC | PRN
  Administered 2018-07-24: 200 ug/kg/min via INTRAVENOUS

## 2018-07-24 MED ORDER — LACTATED RINGERS IV SOLN
INTRAVENOUS | Status: DC
  Administered 2018-07-24: 14:00:00 via INTRAVENOUS

## 2018-07-24 MED ORDER — ENOXAPARIN SODIUM 40 MG/0.4ML ~~LOC~~ SOLN: 40.0000 mg | SUBCUTANEOUS | Status: DC

## 2018-07-24 MED ORDER — ACETAMINOPHEN 500 MG PO TABS
1000.0000 mg | ORAL_TABLET | ORAL | Status: AC
  Administered 2018-07-24: 1000 mg via ORAL

## 2018-07-24 MED ORDER — PROPOFOL 10 MG/ML IV BOLUS
INTRAVENOUS | Status: DC | PRN
  Administered 2018-07-24: 200 mg via INTRAVENOUS

## 2018-07-24 MED ORDER — BUPIVACAINE-EPINEPHRINE (PF) 0.25% -1:200000 IJ SOLN
INTRAMUSCULAR | Status: AC
  Filled 2018-07-24: qty 30

## 2018-07-24 MED ORDER — ONDANSETRON HCL 4 MG/2ML IJ SOLN: 4.0000 mg | Freq: Four times a day (QID) | INTRAMUSCULAR | Status: DC | PRN

## 2018-07-24 MED ORDER — ONDANSETRON HCL 4 MG/2ML IJ SOLN
INTRAMUSCULAR | Status: AC
  Filled 2018-07-24: qty 2

## 2018-07-24 MED ORDER — ACETAMINOPHEN 500 MG PO TABS
ORAL_TABLET | ORAL | Status: AC
  Administered 2018-07-24: 1000 mg via ORAL
  Filled 2018-07-24: qty 2

## 2018-07-24 MED ORDER — SCOPOLAMINE 1 MG/3DAYS TD PT72
MEDICATED_PATCH | TRANSDERMAL | Status: DC | PRN
  Administered 2018-07-24: 1 via TRANSDERMAL

## 2018-07-24 MED ORDER — ONDANSETRON 4 MG PO TBDP: 4.0000 mg | ORAL_TABLET | Freq: Four times a day (QID) | ORAL | Status: DC | PRN

## 2018-07-24 MED ORDER — LORATADINE 10 MG PO TABS
10.0000 mg | ORAL_TABLET | Freq: Every day | ORAL | Status: DC
  Administered 2018-07-25: 10 mg via ORAL
  Filled 2018-07-24: qty 1

## 2018-07-24 MED ORDER — MIDAZOLAM HCL 2 MG/2ML IJ SOLN
INTRAMUSCULAR | Status: AC
  Filled 2018-07-24: qty 2

## 2018-07-24 SURGICAL SUPPLY — 46 items
APPLIER CLIP 9.375 MED OPEN (MISCELLANEOUS) ×4
BENZOIN TINCTURE PRP APPL 2/3 (GAUZE/BANDAGES/DRESSINGS) ×2 IMPLANT
BIOPATCH RED 1 DISK 7.0 (GAUZE/BANDAGES/DRESSINGS) ×2 IMPLANT
BLADE CLIPPER SURG (BLADE) IMPLANT
CANISTER SUCT 3000ML PPV (MISCELLANEOUS) ×2 IMPLANT
CHLORAPREP W/TINT 26ML (MISCELLANEOUS) IMPLANT
CLIP APPLIE 9.375 MED OPEN (MISCELLANEOUS) ×2 IMPLANT
CONT SPEC 4OZ CLIKSEAL STRL BL (MISCELLANEOUS) ×2 IMPLANT
COVER SURGICAL LIGHT HANDLE (MISCELLANEOUS) ×2 IMPLANT
COVER WAND RF STERILE (DRAPES) ×2 IMPLANT
DECANTER SPIKE VIAL GLASS SM (MISCELLANEOUS) IMPLANT
DERMABOND ADVANCED (GAUZE/BANDAGES/DRESSINGS) ×1
DERMABOND ADVANCED .7 DNX12 (GAUZE/BANDAGES/DRESSINGS) ×1 IMPLANT
DRAIN CHANNEL 19F RND (DRAIN) ×2 IMPLANT
DRAPE CHEST BREAST 15X10 FENES (DRAPES) ×2 IMPLANT
DRSG TEGADERM 4X4.75 (GAUZE/BANDAGES/DRESSINGS) ×2 IMPLANT
ELECT CAUTERY BLADE 6.4 (BLADE) ×2 IMPLANT
ELECT REM PT RETURN 9FT ADLT (ELECTROSURGICAL) ×2
ELECTRODE REM PT RTRN 9FT ADLT (ELECTROSURGICAL) ×1 IMPLANT
EVACUATOR SILICONE 100CC (DRAIN) ×2 IMPLANT
GLOVE BIO SURGEON STRL SZ7 (GLOVE) ×2 IMPLANT
GLOVE BIOGEL PI IND STRL 7.5 (GLOVE) ×1 IMPLANT
GLOVE BIOGEL PI INDICATOR 7.5 (GLOVE) ×1
GOWN STRL REUS W/ TWL LRG LVL3 (GOWN DISPOSABLE) ×2 IMPLANT
GOWN STRL REUS W/TWL LRG LVL3 (GOWN DISPOSABLE) ×2
HEMOSTAT ARISTA ABSORB 3G PWDR (MISCELLANEOUS) ×2 IMPLANT
KIT BASIN OR (CUSTOM PROCEDURE TRAY) ×2 IMPLANT
KIT TURNOVER KIT B (KITS) ×2 IMPLANT
NEEDLE HYPO 25GX1X1/2 BEV (NEEDLE) IMPLANT
NS IRRIG 1000ML POUR BTL (IV SOLUTION) ×2 IMPLANT
PACK GENERAL/GYN (CUSTOM PROCEDURE TRAY) ×2 IMPLANT
PAD ARMBOARD 7.5X6 YLW CONV (MISCELLANEOUS) ×4 IMPLANT
PENCIL SMOKE EVACUATOR (MISCELLANEOUS) ×2 IMPLANT
STAPLER VISISTAT 35W (STAPLE) ×2 IMPLANT
STRIP CLOSURE SKIN 1/2X4 (GAUZE/BANDAGES/DRESSINGS) ×2 IMPLANT
SUT ETHILON 2 0 FS 18 (SUTURE) ×4 IMPLANT
SUT MNCRL AB 4-0 PS2 18 (SUTURE) ×2 IMPLANT
SUT SILK 3 0 (SUTURE) ×1
SUT SILK 3-0 18XBRD TIE 12 (SUTURE) ×1 IMPLANT
SUT VIC AB 2-0 SH 27 (SUTURE) ×1
SUT VIC AB 2-0 SH 27X BRD (SUTURE) ×1 IMPLANT
SUT VIC AB 3-0 SH 27 (SUTURE) ×1
SUT VIC AB 3-0 SH 27X BRD (SUTURE) ×1 IMPLANT
SYR CONTROL 10ML LL (SYRINGE) IMPLANT
TOWEL OR 17X24 6PK STRL BLUE (TOWEL DISPOSABLE) ×2 IMPLANT
TOWEL OR 17X26 10 PK STRL BLUE (TOWEL DISPOSABLE) ×2 IMPLANT

## 2018-07-24 NOTE — Anesthesia Preprocedure Evaluation (Signed)
Anesthesia Evaluation  Patient identified by MRN, date of birth, ID band Patient awake    Reviewed: Allergy & Precautions, NPO status , Patient's Chart, lab work & pertinent test results  Airway Mallampati: II  TM Distance: >3 FB Neck ROM: Full    Dental  (+) Dental Advisory Given   Pulmonary neg pulmonary ROS,    breath sounds clear to auscultation       Cardiovascular negative cardio ROS   Rhythm:Regular Rate:Normal     Neuro/Psych  Headaches,    GI/Hepatic negative GI ROS, Neg liver ROS,   Endo/Other  negative endocrine ROS  Renal/GU negative Renal ROS     Musculoskeletal   Abdominal   Peds  Hematology negative hematology ROS (+)   Anesthesia Other Findings   Reproductive/Obstetrics                             Lab Results  Component Value Date   WBC 5.0 06/18/2018   HGB 12.9 06/18/2018   HCT 39.3 06/18/2018   MCV 92.3 06/18/2018   PLT 358 06/18/2018   Lab Results  Component Value Date   CREATININE 0.76 06/18/2018   BUN 11 06/18/2018   NA 142 06/18/2018   K 3.7 06/18/2018   CL 106 06/18/2018   CO2 27 06/18/2018    Anesthesia Physical Anesthesia Plan  ASA: II  Anesthesia Plan: General   Post-op Pain Management:  Regional for Post-op pain   Induction: Intravenous  PONV Risk Score and Plan: 3 and Dexamethasone, Ondansetron, Treatment may vary due to age or medical condition and Midazolam  Airway Management Planned: LMA  Additional Equipment:   Intra-op Plan:   Post-operative Plan: Extubation in OR  Informed Consent: I have reviewed the patients History and Physical, chart, labs and discussed the procedure including the risks, benefits and alternatives for the proposed anesthesia with the patient or authorized representative who has indicated his/her understanding and acceptance.   Dental advisory given  Plan Discussed with: CRNA  Anesthesia Plan Comments:          Anesthesia Quick Evaluation

## 2018-07-24 NOTE — Anesthesia Procedure Notes (Signed)
Procedure Name: LMA Insertion Date/Time: 07/24/2018 1:45 PM Performed by: Cleda Daub, CRNA Pre-anesthesia Checklist: Patient identified, Emergency Drugs available, Suction available and Patient being monitored Patient Re-evaluated:Patient Re-evaluated prior to induction Oxygen Delivery Method: Circle system utilized Preoxygenation: Pre-oxygenation with 100% oxygen Induction Type: IV induction LMA: LMA inserted LMA Size: 4.0 Number of attempts: 1 Placement Confirmation: positive ETCO2 Tube secured with: Tape Dental Injury: Teeth and Oropharynx as per pre-operative assessment

## 2018-07-24 NOTE — Op Note (Signed)
Preoperative diagnosis: Left breast cancer status post primary chemotherapy followed by bilateral nipple sparing mastectomy with left targeted axillary lymph node dissection and a positive node. Postoperative diagnosis: Same as above Procedure: Completion left axillary lymph node dissection Surgeon: Dr. Serita Grammes Anesthesia: General with pectoral block Estimated blood loss: Less than 20 cc Specimens: Left axillary contents to pathology Drains: 19 French Blake drain to left axilla Complications: None Sponge and needle count was correct at the completion Disposition to recovery stable  Indications: This is a 42 year old female who presents after undergoing bilateral nipple sparing mastectomy with left targeted axillary node dissection after chemotherapy with a positive node.  We discussed all of her options and elected to proceed with a completion left axillary node dissection.  Procedure: After informed consent was obtained the patient first underwent a pectoral block on the left.  She was given antibiotics.  SCDs were in place.  She was then placed under general endotracheal anesthesia without complication.  Her axilla was then prepped and draped in the standard sterile surgical fashion.  A surgical timeout was then performed.  I used her prior axillary incision and enlarged this both anteriorly and posteriorly.  I dissected down through the axillary fascia.  She had a fair amount of scarring from her prior targeted node dissection.  I was able to identify the old cavity.  I then was able to identify the axillary vein, thoracodorsal bundle, and the long thoracic nerve.  These were all preserved.  I swept the tissue caudad from the vein all the way down to the muscle and up against the chest wall and remove the entire axillary contents.  Once I done this hemostasis was obtained.  I did place some [place] in the cavity.  A 19 [place] Blake drain was placed and secured with a 2-0 nylon suture.  I  then closed this with 2-0 Vicryl, 3-0 Vicryl, and 4-0 Monocryl.  Steri-Strips were applied.  She tolerated this well was extubated transferred to recovery stable.

## 2018-07-24 NOTE — Anesthesia Procedure Notes (Signed)
Anesthesia Regional Block: Pectoralis block   Pre-Anesthetic Checklist: ,, timeout performed, Correct Patient, Correct Site, Correct Laterality, Correct Procedure, Correct Position, site marked, Risks and benefits discussed,  Surgical consent,  Pre-op evaluation,  At surgeon's request and post-op pain management  Laterality: Left  Prep: Dura Prep       Needles:  Injection technique: Single-shot  Needle Type: Echogenic Needle     Needle Length: 9cm  Needle Gauge: 21     Additional Needles:   Procedures:,,,, ultrasound used (permanent image in chart),,,,  Narrative:  Start time: 07/24/2018 1:15 PM End time: 07/24/2018 1:22 PM Injection made incrementally with aspirations every 5 mL.  Performed by: Personally  Anesthesiologist: Suzette Battiest, MD

## 2018-07-24 NOTE — Progress Notes (Signed)
Patient having numbness in the left arm down to her fingertips. Dr. Suzette Battiest with anesthesia here to see patient, doesn't think it is related to block. MD notified Dr. Donne Hazel, who believes it will improve. Will continue to monitor patient.

## 2018-07-24 NOTE — Interval H&P Note (Signed)
History and Physical Interval Note:  07/24/2018 1:20 PM  Kristi [name]  has presented today for surgery, with the diagnosis of BREAST CANCER  The various methods of treatment have been discussed with the patient and family. After consideration of risks, benefits and other options for treatment, the patient has consented to  Procedure(s) with comments: LEFT AXILLARY LYMPH NODE DISSECTION (Left) - GENERAL WITH PEC BLOCK as a surgical intervention .  The patient's history has been reviewed, patient examined, no change in status, stable for surgery.  I have reviewed the patient's chart and labs.  Questions were answered to the patient's satisfaction.     Rolm Bookbinder

## 2018-07-24 NOTE — Transfer of Care (Signed)
Immediate Anesthesia Transfer of Care Note  Patient: Kristi [name]  Procedure(s) Performed: LEFT AXILLARY LYMPH NODE DISSECTION (Left Axilla)  Patient Location: PACU  Anesthesia Type:GA combined with regional for post-op pain  Level of Consciousness: drowsy and patient cooperative  Airway & Oxygen Therapy: Patient Spontanous Breathing and Patient connected to face mask oxygen  Post-op Assessment: Report given to RN and Post -op Vital signs reviewed and stable  Post vital signs: Reviewed and stable  Last Vitals:  Vitals Value Taken Time  BP 143/85 07/24/2018  3:00 PM  Temp    Pulse 111 07/24/2018  3:01 PM  Resp 15 07/24/2018  3:01 PM  SpO2 100 % 07/24/2018  3:01 PM  Vitals shown include unvalidated device data.  Last Pain:  Vitals:   07/24/18 1236  TempSrc:   PainSc: 0-No pain         Complications: No apparent anesthesia complications

## 2018-07-25 DIAGNOSIS — C773 Secondary and unspecified malignant neoplasm of axilla and upper limb lymph nodes: Secondary | ICD-10-CM | POA: Diagnosis not present

## 2018-07-25 MED ORDER — OXYCODONE HCL 5 MG PO TABS: 5.0000 mg | ORAL_TABLET | Freq: Four times a day (QID) | ORAL | 0 refills | Status: DC | PRN

## 2018-07-25 NOTE — Progress Notes (Signed)
Rx for Oxycondone called into [facility] electronically by Dr. Donne Hazel.  Pt understands wound care and JP drain care and how to keep record of drainage.  Follow up appointments reviewed with pt.

## 2018-07-25 NOTE — Plan of Care (Signed)
  Problem: Education: Goal: Knowledge of General Education information will improve Description Including pain rating scale, medication(s)/side effects and non-pharmacologic comfort measures Outcome: Progressing   Problem: Education: Goal: Knowledge of General Education information will improve Description Including pain rating scale, medication(s)/side effects and non-pharmacologic comfort measures Outcome: Progressing   Problem: Clinical Measurements: Goal: Ability to maintain clinical measurements within normal limits will improve Outcome: Progressing   Problem: Clinical Measurements: Goal: Respiratory complications will improve Outcome: Progressing   Problem: Activity: Goal: Risk for activity intolerance will decrease Outcome: Progressing

## 2018-07-25 NOTE — Discharge Summary (Signed)
Physician Discharge Summary  Patient ID: Kristi [name] MRN: [id number] DOB/AGE: [birth year] 43 y.o.  Admit date: 07/24/2018 Discharge date: 07/25/2018  Admission Diagnoses: Breast cancer Discharge Diagnoses:  Active Problems:   Breast cancer, left [facility])   Discharged Condition: good  Hospital Course: 43 yof s/p bilateral nsm with tad after primary chemo for breast cancer. Had pos node with ece.  I returned her to OR for completion alnd. Doing well and will dc today  Consults: None  Significant Diagnostic Studies: none  Treatments: surgery: left aldn  Discharge Exam: Blood pressure 96/63, pulse 89, temperature 97.7 F (36.5 C), temperature source Oral, resp. rate 15, height 5\' 5"  (1.651 m), weight 65.4 kg, SpO2 100 %. left axillary dressing clean, drain serosang as expected  Disposition: Discharge disposition: 01-Home or Self Care        Allergies as of 07/25/2018      Reactions   Sulfa Antibiotics Rash, Other (See Comments)   SEVERE HEADACHE   Chlorhexidine Gluconate Other (See Comments)   redness   Other Itching, Rash   Doesn't seem to tolerate surgical glue Surgical glue Dermabond      Medication List    TAKE these medications   B-2-400 400 MG Caps Generic drug:  Riboflavin Take 400 mg by mouth 2 (two) times daily.   baclofen 10 MG tablet Commonly known as:  LIORESAL Take 10 mg by mouth 2 (two) times daily as needed (headaches/spasms.).   buPROPion 75 MG tablet Commonly known as:  WELLBUTRIN Take 75 mg by mouth 2 (two) times daily.   CALCIUM 600-D 600-400 MG-UNIT Tabs Generic drug:  Calcium Carbonate-Vitamin D3 Take 1 tablet by mouth daily.   doxycycline 50 MG capsule Commonly known as:  VIBRAMYCIN Take 1 capsule (50 mg total) by mouth 2 (two) times daily.   eletriptan 40 MG tablet Commonly known as:  RELPAX Take 40 mg by mouth every 2 (two) hours as needed for migraine (max 2 doses per 24 hrs.).   EMGALITY 120 MG/ML [place] Generic  drug:  Galcanezumab-gnlm Inject 120 mg into the skin every 30 (thirty) days.   fexofenadine 180 MG tablet Commonly known as:  ALLEGRA Take 180 mg by mouth daily.   gabapentin 300 MG capsule Commonly known as:  NEURONTIN Take 1 capsule (300 mg total) by mouth at bedtime.   HAIR SKIN AND NAILS FORMULA PO Take 1 tablet by mouth daily.   HYDROcodone-acetaminophen 10-325 MG tablet Commonly known as:  NORCO Take 1 tablet by mouth every 6 (six) hours as needed.   LORazepam 0.5 MG tablet Commonly known as:  ATIVAN TAKE 1 TABLET BY MOUTH EVERY 8 HOURS AS NEEDED FOR ANXIETY What changed:    when to take this  additional instructions   Magnesium 500 MG Caps Take 500 mg by mouth 2 (two) times daily.   multivitamin with minerals Tabs tablet Take 1 tablet by mouth daily. Women's One A Day Multivitamin   ondansetron 8 MG tablet Commonly known as:  ZOFRAN Take 8 mg by mouth 2 (two) times daily as needed for nausea or vomiting.   oxyCODONE 5 MG immediate release tablet Commonly known as:  ROXICODONE Take 1-2 tablets (5-10 mg total) by mouth every 4 (four) hours as needed. What changed:  reasons to take this   oxyCODONE 5 MG immediate release tablet Commonly known as:  Oxy IR/ROXICODONE Take 1 tablet (5 mg total) by mouth every 6 (six) hours as needed for moderate pain, severe pain or breakthrough pain. What  changed:  You were already taking a medication with the same name, and this prescription was added. Make sure you understand how and when to take each.   prochlorperazine 10 MG tablet Commonly known as:  COMPAZINE Take 10 mg by mouth every 6 (six) hours as needed for nausea or vomiting.   promethazine 25 MG tablet Commonly known as:  PHENERGAN Take 25 mg by mouth every 6 (six) hours as needed for nausea or vomiting.   REWETTING DROPS Soln Place 1 drop into both eyes 3 (three) times daily as needed (contact lenses.).   ZOLADEX [place] Inject 1 Dose into the skin every 30  (thirty) days.      Follow-up Information    Rolm Bookbinder, MD In 1 week.   Specialty:  General Surgery Contact information: [place] [address] [phone]           Signed: Rolm Bookbinder 07/25/2018, 9:50 AM

## 2018-07-25 NOTE — Progress Notes (Signed)
Pt ready for DC to home accompanied by family.  Breast cancer bag given and explained.  Supplies for home given and explained.

## 2018-07-25 NOTE — Discharge Instructions (Signed)
[facility] [place] [phone]   POST OP INSTRUCTIONS Take 400 mg of ibuprofen every 8 hours or 650 mg tylenol every 6 hours for next 72 hours then as needed. Use ice several times daily also. Always review your discharge instruction sheet given to you by the facility where your surgery was performed. IF YOU HAVE DISABILITY OR FAMILY LEAVE FORMS, YOU MUST BRING THEM TO THE OFFICE FOR PROCESSING.   DO NOT GIVE THEM TO YOUR DOCTOR. A prescription for pain medication may be given to you upon discharge.  Take your pain medication as prescribed, if needed.  If narcotic pain medicine is not needed, then you may take acetaminophen (Tylenol), naprosyn (Alleve) or ibuprofen (Advil) as needed. 1. Take your usually prescribed medications unless otherwise directed. 2. If you need a refill on your pain medication, please contact your pharmacy.  They will contact our office to request authorization.  Prescriptions will not be filled after 5pm or on week-ends. 3. You should follow a light diet the first few days after arrival home, such as soup and crackers, etc.  Resume your normal diet the day after surgery. 4. Most patients will experience some swelling and bruising on the chest and underarm.  Ice packs will help.  Swelling and bruising can take several days to resolve. Wear the binder day and night until you return to the office.  5. It is common to experience some constipation if taking pain medication after surgery.  Increasing fluid intake and taking a stool softener (such as Colace) will usually help or prevent this problem from occurring.  A mild laxative (Milk of Magnesia or Miralax) should be taken according to package instructions if there are no bowel movements after 48 hours. 6. Unless discharge instructions indicate otherwise, leave your bandage dry and in place until your next appointment in 3-5 days.  You may take a limited sponge bath.  No tube baths or showers until the drains are  removed.  You may have steri-strips (small skin tapes) in place directly over the incision.  These strips should be left on the skin for 7-10 days. If you have glue it will come off in next couple week.  Any sutures will be removed at an office visit 7. DRAINS:  If you have drains in place, it is important to keep a list of the amount of drainage produced each day in your drains.  Before leaving the hospital, you should be instructed on drain care.  Call our office if you have any questions about your drains. I will remove your drains when they put out less than 30 cc or ml for 2 consecutive days. 8. ACTIVITIES:  You may resume regular (light) daily activities beginning the next day--such as daily self-care, walking, climbing stairs--gradually increasing activities as tolerated.  You may have sexual intercourse when it is comfortable.  Refrain from any heavy lifting or straining until approved by your doctor. a. You may drive when you are no longer taking prescription pain medication, you can comfortably wear a seatbelt, and you can safely maneuver your car and apply brakes. b. RETURN TO WORK:  __________________________________________________________ 9. You should see your doctor in the office for a follow-up appointment approximately 3-5 days after your surgery.  Your doctor's nurse will typically make your follow-up appointment when she calls you with your pathology report.  Expect your pathology report 3-4business days after surgery. 10. OTHER INSTRUCTIONS: ______________________________________________________________________________________________ ____________________________________________________________________________________________ WHEN TO CALL YOUR DR Armya Westerhoff: 1. Fever over 101.0 2.   Nausea and/or vomiting 3. Extreme swelling or bruising 4. Continued bleeding from incision. 5. Increased pain, redness, or drainage from the incision. The clinic staff is available to answer your questions  during regular business hours.  Please don't hesitate to call and ask to speak to one of the nurses for clinical concerns.  If you have a medical emergency, go to the nearest emergency room or call 911.  A surgeon from [facility] is always on call at the hospital. [address], [address], [place], [place]  [address] ? [address] [address], [place], [place]   [address] [phone] ? [phone] ? FAX [phone] Web site: [email or web address]  

## 2018-07-26 ENCOUNTER — Encounter (HOSPITAL_COMMUNITY): Payer: Self-pay | Admitting: General Surgery

## 2018-07-26 NOTE — Anesthesia Postprocedure Evaluation (Signed)
Anesthesia Post Note  Patient: Lerin Jech Astorga  Procedure(s) Performed: LEFT AXILLARY LYMPH NODE DISSECTION (Left Axilla)     Patient location during evaluation: PACU Anesthesia Type: General Level of consciousness: awake and alert Pain management: pain level controlled Vital Signs Assessment: post-procedure vital signs reviewed and stable Respiratory status: spontaneous breathing, nonlabored ventilation, respiratory function stable and patient connected to nasal cannula oxygen Cardiovascular status: blood pressure returned to baseline and stable Postop Assessment: no apparent nausea or vomiting Anesthetic complications: no    Last Vitals:  Vitals:   07/25/18 0605 07/25/18 1000  BP: 96/63 (!) 145/82  Pulse: 89 90  Resp: 15 17  Temp: 36.5 C 36.9 C  SpO2: 100% 100%    Last Pain:  Vitals:   07/25/18 1114  TempSrc:   PainSc: 3                  Tiajuana Amass

## 2018-07-30 ENCOUNTER — Other Ambulatory Visit: Payer: Self-pay

## 2018-07-30 ENCOUNTER — Encounter (HOSPITAL_COMMUNITY): Payer: Self-pay | Admitting: Emergency Medicine

## 2018-07-30 ENCOUNTER — Emergency Department (HOSPITAL_COMMUNITY)
Admission: EM | Admit: 2018-07-30 | Discharge: 2018-07-31 | Disposition: A | Discharge status: Home / Self Care | Payer: Commercial Managed Care - PPO | Attending: Emergency Medicine | Admitting: Emergency Medicine

## 2018-07-30 DIAGNOSIS — Z853 Personal history of malignant neoplasm of breast: Secondary | ICD-10-CM | POA: Insufficient documentation

## 2018-07-30 DIAGNOSIS — Z79899 Other long term (current) drug therapy: Secondary | ICD-10-CM | POA: Diagnosis not present

## 2018-07-30 DIAGNOSIS — R202 Paresthesia of skin: Secondary | ICD-10-CM | POA: Diagnosis present

## 2018-07-30 DIAGNOSIS — R110 Nausea: Secondary | ICD-10-CM | POA: Diagnosis not present

## 2018-07-30 MED ORDER — ONDANSETRON 4 MG PO TBDP
4.0000 mg | ORAL_TABLET | Freq: Once | ORAL | Status: AC | PRN
  Administered 2018-07-31: 4 mg via ORAL
  Filled 2018-07-30: qty 1

## 2018-07-30 NOTE — ED Triage Notes (Signed)
Pt presents with complaints of numbness in her hand and in her legs (knees down) with associated nausea. Patient had a mastectomy 07/02/2018, patient has JP drain in place. Patient states today she began having nausea today with no vomiting with the associated numbness. Patient complaining of hands also cramping, hands notes to be cramping in triage.

## 2018-07-30 NOTE — ED Notes (Signed)
ATTEMPTED BLOOD DRAW X1 UNSUCCESSFUL

## 2018-07-31 LAB — COMPREHENSIVE METABOLIC PANEL
ALBUMIN: 4.5 g/dL (ref 3.5–5.0)
ALT: 28 U/L (ref 0–44)
AST: 25 U/L (ref 15–41)
Alkaline Phosphatase: 76 U/L (ref 38–126)
Anion gap: 14 (ref 5–15)
BUN: 11 mg/dL (ref 6–20)
CO2: 23 mmol/L (ref 22–32)
Calcium: 10.3 mg/dL (ref 8.9–10.3)
Chloride: 104 mmol/L (ref 98–111)
Creatinine, Ser: 0.79 mg/dL (ref 0.44–1.00)
GFR calc Af Amer: >60 mL/min (ref >60)
GFR calc non Af Amer: >60 mL/min (ref >60)
GLUCOSE: 107 mg/dL — AB (ref 70–99)
Potassium: 3.5 mmol/L (ref 3.5–5.1)
SODIUM: 141 mmol/L (ref 135–145)
Total Bilirubin: 1 mg/dL (ref 0.3–1.2)
Total Protein: 7.6 g/dL (ref 6.5–8.1)

## 2018-07-31 LAB — CBC
HEMATOCRIT: 41.3 % (ref 36.0–46.0)
Hemoglobin: 13.6 g/dL (ref 12.0–15.0)
MCH: 29.2 pg (ref 26.0–34.0)
MCHC: 32.9 g/dL (ref 30.0–36.0)
MCV: 88.8 fL (ref 80.0–100.0)
Platelets: 457 10*3/uL — ABNORMAL HIGH (ref 150–400)
RBC: 4.65 MIL/uL (ref 3.87–5.11)
RDW: 13.3 % (ref 11.5–15.5)
WBC: 11.8 10*3/uL — ABNORMAL HIGH (ref 4.0–10.5)
nRBC: 0 % (ref 0.0–0.2)

## 2018-07-31 LAB — I-STAT BETA HCG BLOOD, ED (MC, WL, AP ONLY): I-stat hCG, quantitative: <5 m[IU]/mL (ref <5)

## 2018-07-31 LAB — LIPASE, BLOOD: Lipase: 57 U/L — ABNORMAL HIGH (ref 11–51)

## 2018-07-31 MED ORDER — LORAZEPAM 1 MG PO TABS: 1.0000 mg | ORAL_TABLET | Freq: Three times a day (TID) | ORAL | 0 refills | Status: DC | PRN

## 2018-07-31 MED ORDER — LORAZEPAM 2 MG/ML IJ SOLN
1.0000 mg | Freq: Once | INTRAMUSCULAR | Status: AC
  Administered 2018-07-31: 1 mg via INTRAVENOUS
  Filled 2018-07-31: qty 1

## 2018-07-31 NOTE — Discharge Instructions (Signed)
Take Ativan as directed, as needed for recurrent symptoms of cramping, paresthesia, agitation/restlessness. Return to the ED with any worsening symptoms or new concerns. Otherwise, follow up with your doctor for recheck in the next couple of days.

## 2018-07-31 NOTE — ED Provider Notes (Signed)
[facility] DEPT Provider Note   CSN: [id number] Arrival date & time: 07/30/18  1903     History   Chief Complaint Chief Complaint  Patient presents with  . Numbness  . Nausea    HPI Kristi [name] is a 43 y.o. female.  Patient to the ED for evaluation of numbness in hands and LE's with nausea. Her hands have been cramping and she feels muscle cramping in her neck as well. No fever. She has recent surgery for breast cancer (one month ago). Per her parent, she has been tapering off her narcotic pain medications over the last week, finishing earlier today after taking 1/2 norco every 6 hours, down from 1 every 6 hours yesterday. She is taking Phenergan for nausea and has been taking this for 2 weeks without adverse side effect. No fever, pain, SOB or urinary symptoms.   The history is provided by the patient and a parent. No language interpreter was used.    Past Medical History:  Diagnosis Date  . Breast cancer, left ([place]) 12/2017  . Endometriosis   . Family history of breast cancer   . Family history of ovarian cancer   . Family history of pancreatic cancer   . Family history of prostate cancer   . History of chemotherapy    finished chemo 06/18/2018  . Migraines   . PONV (postoperative nausea and vomiting)   . Wears contact lenses     Patient Active Problem List   Diagnosis Date Noted  . Breast cancer, left ([place]) 07/02/2018  . Chemotherapy induced neutropenia ([place]) 06/06/2018  . Port-A-Cath in place 04/20/2018  . Genetic testing 01/29/2018  . Family history of breast cancer   . Family history of prostate cancer   . Family history of ovarian cancer   . Family history of pancreatic cancer   . Malignant neoplasm of upper-outer quadrant of left breast in female, estrogen receptor positive ([place]) 01/04/2018    Past Surgical History:  Procedure Laterality Date  . AXILLARY LYMPH NODE DISSECTION Left 07/24/2018  . AXILLARY LYMPH  NODE DISSECTION Left 07/24/2018   Procedure: LEFT AXILLARY LYMPH NODE DISSECTION;  Surgeon: Rolm Bookbinder, MD;  Location: [place] [facility];  Service: General;  Laterality: Left;  GENERAL WITH PEC BLOCK  . BREAST RECONSTRUCTION WITH PLACEMENT OF TISSUE EXPANDER AND ALLODERM Bilateral 07/02/2018   Procedure: BILATERAL BREAST RECONSTRUCTION WITH PLACEMENT OF TISSUE EXPANDER AND ACELLULAR DERMIS;  Surgeon: Irene Limbo, MD;  Location: [facility];  Service: Plastics;  Laterality: Bilateral;  . COLONOSCOPY    . MASTECTOMY WITH RADIOACTIVE SEED GUIDED EXCISION AND AXILLARY SENTINEL LYMPH NODE BIOPSY Bilateral 07/02/2018   Procedure: BILATERAL NIPPLE SPARING MASTECTOMIES WITH LEFT AXILLARY SENTINEL LYMPH NODE BIOPSY AND LEFT SEED GUIDED NODE EXCISION;  Surgeon: Rolm Bookbinder, MD;  Location: [facility];  Service: General;  Laterality: Bilateral;  . NASAL POLYP EXCISION    . OVARIAN CYST REMOVAL    . PORTA CATH REMOVAL Right 07/02/2018   Procedure: PORTA CATH REMOVAL;  Surgeon: Rolm Bookbinder, MD;  Location: [facility];  Service: General;  Laterality: Right;  . PORTACATH PLACEMENT Right 01/15/2018   Procedure: INSERTION PORT-A-CATH WITH [place];  Surgeon: Rolm Bookbinder, MD;  Location: [facility];  Service: General;  Laterality: Right;  . WISDOM TOOTH EXTRACTION       OB History   No obstetric history on file.      Home Medications    Prior to Admission medications  Medication Sig Start Date End Date Taking? Authorizing Provider  B-2-400 400 MG CAPS Take 400 mg by mouth 2 (two) times daily.   Yes [provider]  baclofen (LIORESAL) 10 MG tablet Take 10 mg by mouth 2 (two) times daily as needed (headaches/spasms.).  08/23/17 08/23/18 Yes [provider]  buPROPion (WELLBUTRIN) 75 MG tablet Take 75 mg by mouth 2 (two) times daily. 08/23/17 08/23/18 Yes [provider]  Calcium Carbonate-Vitamin D3 (CALCIUM 600-D)  600-400 MG-UNIT TABS Take 1 tablet by mouth daily.  07/25/17  Yes [provider]  eletriptan (RELPAX) 40 MG tablet Take 40 mg by mouth every 2 (two) hours as needed for migraine (max 2 doses per 24 hrs.).  06/13/18  Yes [provider]  EMGALITY 120 MG/ML [place] Inject 120 mg into the skin every 30 (thirty) days.  12/19/17  Yes [provider]  fexofenadine (ALLEGRA) 180 MG tablet Take 180 mg by mouth daily.   Yes [provider]  gabapentin (NEURONTIN) 300 MG capsule Take 1 capsule (300 mg total) by mouth at bedtime. 04/26/18  Yes Nicholas Lose, MD  Goserelin Acetate (ZOLADEX [place]) Inject 1 Dose into the skin every 30 (thirty) days.    Yes [provider]  HYDROcodone-acetaminophen (NORCO) 10-325 MG tablet Take 1 tablet by mouth every 6 (six) hours as needed for moderate pain.    Yes [provider]  LORazepam (ATIVAN) 0.5 MG tablet TAKE 1 TABLET BY MOUTH EVERY 8 HOURS AS NEEDED FOR ANXIETY Patient taking differently: Take 0.5 mg by mouth at bedtime.  06/26/18  Yes Nicholas Lose, MD  Magnesium 500 MG CAPS Take 500 mg by mouth 2 (two) times daily.   Yes [provider]  Multiple Vitamin (MULTIVITAMIN WITH MINERALS) TABS tablet Take 1 tablet by mouth daily. Women's One A Day Multivitamin   Yes [provider]  Multiple Vitamins-Minerals (HAIR SKIN AND NAILS FORMULA PO) Take 1 tablet by mouth daily.    Yes [provider]  ondansetron (ZOFRAN) 8 MG tablet Take 8 mg by mouth 2 (two) times daily as needed for nausea or vomiting.  03/15/18  Yes [provider]  oxyCODONE (OXY IR/ROXICODONE) 5 MG immediate release tablet Take 1 tablet (5 mg total) by mouth every 6 (six) hours as needed for moderate pain, severe pain or breakthrough pain. 07/25/18  Yes Rolm Bookbinder, MD  prochlorperazine (COMPAZINE) 10 MG tablet Take 10 mg by mouth every 6 (six) hours as needed for nausea or vomiting.   Yes [provider]    promethazine (PHENERGAN) 25 MG tablet Take 25 mg by mouth every 6 (six) hours as needed for nausea or vomiting.   Yes [provider]  Soft Lens Products (REWETTING DROPS) SOLN Place 1 drop into both eyes 3 (three) times daily as needed (contact lenses.).   Yes [provider]  doxycycline (VIBRAMYCIN) 50 MG capsule Take 1 capsule (50 mg total) by mouth 2 (two) times daily. Patient not taking: Reported on 07/31/2018 07/02/18   Irene Limbo, MD  oxyCODONE (ROXICODONE) 5 MG immediate release tablet Take 1-2 tablets (5-10 mg total) by mouth every 4 (four) hours as needed. Patient not taking: Reported on 07/31/2018 07/02/18 07/02/19  Irene Limbo, MD    Family History Family History  Problem Relation Age of Onset  . Prostate cancer Father 94       Stage 1  . Ovarian cancer Maternal Aunt 19       ? Germ cell?  Marland Kitchen  Prostate cancer Maternal Uncle 65       prostectomy  . Pancreatic cancer Maternal Grandmother 66  . Colon cancer Maternal Grandfather 60  . Heart attack Paternal Grandmother   . Prostate cancer Paternal Grandfather        dx in his 74s, d. 52s-90s  . Breast cancer Other        MGMs sister dx over 35    Social History Social History   Tobacco Use  . Smoking status: Never Smoker  . Smokeless tobacco: Never Used  Substance Use Topics  . Alcohol use: Yes    Comment: rare  . Drug use: Never     Allergies   Sulfa antibiotics; Chlorhexidine gluconate; and Other   Review of Systems Review of Systems  Constitutional: Negative for chills and fever.  HENT: Negative.   Respiratory: Negative.   Cardiovascular: Negative.   Gastrointestinal: Positive for nausea.  Musculoskeletal: Negative.        See HPI.  Skin: Negative.   Neurological: Positive for numbness.     Physical Exam Updated Vital Signs BP (!) 148/128 (BP Location: Right Arm)   Pulse 99   Temp 98.2 F (36.8 C) (Oral)   Resp (!) 22   Ht 5\' 5"  (1.651 m)   Wt 68 kg   SpO2 100%    BMI 24.96 kg/m   Physical Exam Constitutional:      Appearance: She is well-developed.     Comments: Restless, agitated, hyperventilating.  HENT:     Head: Normocephalic.  Neck:     Musculoskeletal: Normal range of motion and neck supple.  Cardiovascular:     Rate and Rhythm: Normal rate and regular rhythm.  Pulmonary:     Effort: Pulmonary effort is normal.     Breath sounds: Normal breath sounds. No wheezing, rhonchi or rales.  Abdominal:     General: Bowel sounds are normal.     Palpations: Abdomen is soft.     Tenderness: There is no abdominal tenderness. There is no guarding or rebound.  Musculoskeletal: Normal range of motion.  Skin:    General: Skin is warm and dry.     Findings: Rash (Diffuse, red, nonraised rash to neck and chest.) present.  Neurological:     Mental Status: She is alert.      ED Treatments / Results  Labs (all labs ordered are listed, but only abnormal results are displayed) Labs Reviewed  LIPASE, BLOOD - Abnormal; Notable for the following components:      Result Value   Lipase 57 (*)    All other components within normal limits  COMPREHENSIVE METABOLIC PANEL - Abnormal; Notable for the following components:   Glucose, Bld 107 (*)    All other components within normal limits  CBC - Abnormal; Notable for the following components:   WBC 11.8 (*)    Platelets 457 (*)    All other components within normal limits  URINALYSIS, ROUTINE W REFLEX MICROSCOPIC  I-STAT BETA HCG BLOOD, ED ([facility], [facility], [facility] ONLY)    EKG None  Radiology No results found.  Procedures Procedures (including critical care time)  Medications Ordered in ED Medications  ondansetron (ZOFRAN-ODT) disintegrating tablet 4 mg (4 mg Oral Given 07/31/18 0032)  LORazepam (ATIVAN) injection 1 mg (1 mg Intravenous Given 07/31/18 0032)     Initial Impression / Assessment and Plan / ED Course  I have reviewed the triage vital signs and the nursing notes.  Pertinent labs & imaging  results that  were available during my care of the patient were reviewed by me and considered in my medical decision making (see chart for details).     Patient agitated and hyperventilating on initial exam. IV started. Parents providing most history.   Ativan provided IV. On recheck the patient is calm, without nausea, rash resolved, no numbness, breathing calmly. She denies any symptoms at present and feels much better.   Labs are essentially normal. VSS on monitor. No explanation for patient's symptoms identified.   DDx: narcotic withdrawal vs adverse side effect of phenergan. Ativan has completely resolved all symptoms.   Discussed with Dr. Christy Gentles. Feel the patient can be discharged home. Discussed additional taper of narcotics (patient's use has been reasonable) vs use of Ativan at home. Patient opts for Ativan use at home. Encouraged close follow up with primary care in the next 1-2 days.  Final Clinical Impressions(s) / ED Diagnoses   Final diagnoses:  None   1. Paresthesias  ED Discharge Orders    None       Charlann Lange, PA-C 07/31/18 0200    Ripley Fraise, MD 07/31/18 [phone]

## 2018-08-09 ENCOUNTER — Telehealth: Payer: Self-pay | Admitting: Hematology and Oncology

## 2018-08-09 ENCOUNTER — Other Ambulatory Visit: Payer: Self-pay

## 2018-08-09 ENCOUNTER — Ambulatory Visit: Payer: Commercial Managed Care - PPO | Admitting: Physical Therapy

## 2018-08-09 ENCOUNTER — Encounter: Payer: Self-pay | Admitting: Physical Therapy

## 2018-08-09 DIAGNOSIS — M25612 Stiffness of left shoulder, not elsewhere classified: Secondary | ICD-10-CM | POA: Diagnosis not present

## 2018-08-09 DIAGNOSIS — Z483 Aftercare following surgery for neoplasm: Secondary | ICD-10-CM

## 2018-08-09 DIAGNOSIS — C50412 Malignant neoplasm of upper-outer quadrant of left female breast: Secondary | ICD-10-CM | POA: Diagnosis present

## 2018-08-09 DIAGNOSIS — M6281 Muscle weakness (generalized): Secondary | ICD-10-CM

## 2018-08-09 DIAGNOSIS — M25611 Stiffness of right shoulder, not elsewhere classified: Secondary | ICD-10-CM

## 2018-08-09 DIAGNOSIS — M25512 Pain in left shoulder: Secondary | ICD-10-CM | POA: Insufficient documentation

## 2018-08-09 DIAGNOSIS — Z5111 Encounter for antineoplastic chemotherapy: Secondary | ICD-10-CM | POA: Diagnosis present

## 2018-08-09 NOTE — Telephone Encounter (Signed)
Scheduled appt per 1/16 sch message - pt is aware of appt date and time   

## 2018-08-09 NOTE — Therapy (Signed)
[place], [place], [address] Phone: [phone]   Fax:  [phone]  Physical Therapy Evaluation  Patient Details  Name: Kristi [name] MRN: [id number] Date of Birth: [birth year] Referring Provider (PT): Dr. Donne Hazel   Encounter Date: 08/09/2018  PT End of Session - 08/09/18 1156    Visit Number  1    Number of Visits  9    Date for PT Re-Evaluation  09/06/18    Authorization Type  [facility]- no auth    PT Start Time  0847    PT Stop Time  0916    PT Time Calculation (min)  29 min    Activity Tolerance  Patient tolerated treatment well    Behavior During Therapy  [facility] for tasks assessed/performed       Past Medical History:  Diagnosis Date  . Breast cancer, left ([place]) 12/2017  . Endometriosis   . Family history of breast cancer   . Family history of ovarian cancer   . Family history of pancreatic cancer   . Family history of prostate cancer   . History of chemotherapy    finished chemo 06/18/2018  . Migraines   . PONV (postoperative nausea and vomiting)   . Wears contact lenses     Past Surgical History:  Procedure Laterality Date  . AXILLARY LYMPH NODE DISSECTION Left 07/24/2018  . AXILLARY LYMPH NODE DISSECTION Left 07/24/2018   Procedure: LEFT AXILLARY LYMPH NODE DISSECTION;  Surgeon: Rolm Bookbinder, MD;  Location: [place];  Service: General;  Laterality: Left;  GENERAL WITH PEC BLOCK  . BREAST RECONSTRUCTION WITH PLACEMENT OF TISSUE EXPANDER AND ALLODERM Bilateral 07/02/2018   Procedure: BILATERAL BREAST RECONSTRUCTION WITH PLACEMENT OF TISSUE EXPANDER AND ACELLULAR DERMIS;  Surgeon: Irene Limbo, MD;  Location: [place];  Service: Plastics;  Laterality: Bilateral;  . COLONOSCOPY    . MASTECTOMY WITH RADIOACTIVE SEED GUIDED EXCISION AND AXILLARY SENTINEL LYMPH NODE BIOPSY Bilateral 07/02/2018   Procedure: BILATERAL NIPPLE SPARING MASTECTOMIES WITH LEFT  AXILLARY SENTINEL LYMPH NODE BIOPSY AND LEFT SEED GUIDED NODE EXCISION;  Surgeon: Rolm Bookbinder, MD;  Location: [facility];  Service: General;  Laterality: Bilateral;  . NASAL POLYP EXCISION    . OVARIAN CYST REMOVAL    . PORTA CATH REMOVAL Right 07/02/2018   Procedure: PORTA CATH REMOVAL;  Surgeon: Rolm Bookbinder, MD;  Location: [place];  Service: General;  Laterality: Right;  . PORTACATH PLACEMENT Right 01/15/2018   Procedure: INSERTION PORT-A-CATH WITH [place];  Surgeon: Rolm Bookbinder, MD;  Location: [facility];  Service: General;  Laterality: Right;  . WISDOM TOOTH EXTRACTION      There were no vitals filed for this visit.   Subjective Assessment - 08/09/18 0851    Subjective  I had a left mastectomy on 07/02/18. The additional axillary lymph nodes were removed on 07/24/18. The first lymph node was positive out of the group of 4 then the other 4 were negative. I had a prophylactic mastectomy on the right and I have 2 expanders in place. Dr. Donne Hazel gave me five exercises to do and I have been doing those.     Pertinent History  bilateral mastectomy on 07/02/18 for left breast cancer, SLNB then ALND on 07/24/18, pt has completed chemotherapy 16 treatments over 5 months, pt will begin radiation soon    Patient Stated Goals  to get arm in position for radiation, strengthening L UE    Currently in  Pain?  No/denies    Pain Score  0-No pain         [facility] PT Assessment - 08/09/18 0001      Assessment   Medical Diagnosis  left breast cancer    Referring Provider (PT)  Dr. Donne Hazel    Onset Date/Surgical Date  07/02/18    Hand Dominance  Right    Prior Therapy  none      Precautions   Precautions  Other (comment)    Precaution Comments  at risk for lymphedema      Restrictions   Weight Bearing Restrictions  No      Balance Screen   Has the patient fallen in the past 6 months  No    Has the patient had a decrease in activity  level because of a fear of falling?   No    Is the patient reluctant to leave their home because of a fear of falling?   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, adult, some desk work    Leisure  prior to chemotherapy pt went to the gym and did cardio and [employer]      Cognition   Overall Cognitive Status  Within Functional Limits for tasks assessed      ROM / Strength   AROM / PROM / Strength  AROM      AROM   AROM Assessment Site  Shoulder    Right/Left Shoulder  Right;Left    Right Shoulder Flexion  152 Degrees    Right Shoulder ABduction  156 Degrees    Right Shoulder Internal Rotation  59 Degrees    Right Shoulder External Rotation  90 Degrees    Left Shoulder Flexion  95 Degrees   with pain in axilla   Left Shoulder ABduction  62 Degrees    Left Shoulder Internal Rotation  --   not tested secondary to pain   Left Shoulder External Rotation  --   not tested secondary to pain       LYMPHEDEMA/ONCOLOGY QUESTIONNAIRE - 08/09/18 0902      Type   Cancer Type  left breast cancer      Surgeries   Mastectomy Date  07/02/18    Sentinel Lymph Node Biopsy Date  07/02/18    Axillary Lymph Node Dissection Date  07/24/18    Number Lymph Nodes Removed  8      Treatment   Active Chemotherapy Treatment  No    Past Chemotherapy Treatment  Yes    Active Radiation Treatment  No   will start radiation soon   Past Radiation Treatment  No    Current Hormone Treatment  No   will start after radiation   Past Hormone Therapy  No      What other symptoms do you have   Are you Having Heaviness or Tightness  Yes    Are you having Pain  Yes    Are you having pitting edema  No    Is it Hard or  Difficult finding clothes that fit  No    Do  you have infections  No    Is there Decreased scar mobility  --   pt reports they are still healing     Lymphedema Assessments   Lymphedema Assessments  Upper extremities      Right Upper Extremity Lymphedema   15 cm Proximal to Olecranon Process  29.5 cm    Olecranon Process  23.4 cm    15 cm Proximal to Ulnar Styloid Process  23.1 cm    Just Proximal to Ulnar Styloid Process  15.6 cm    Across Hand at [employer]  19.3 cm    At [place] of 2nd Digit  5.9 cm      Left Upper Extremity Lymphedema   15 cm Proximal to Olecranon Process  28.9 cm    Olecranon Process  24 cm    15 cm Proximal to Ulnar Styloid Process  23 cm    Just Proximal to Ulnar Styloid Process  15.1 cm    Across Hand at [employer]  19 cm    At [place] of 2nd Digit  5.9 cm          Quick Dash - 08/09/18 0001    Open a tight or new jar  Moderate difficulty    Do heavy household chores (wash walls, wash floors)  Moderate difficulty    Carry a shopping bag or briefcase  Moderate difficulty    Wash your back  Severe difficulty    Use a knife to cut food  Mild difficulty    Recreational activities in which you take some force or impact through your arm, shoulder, or hand (golf, hammering, tennis)  Unable    During the past week, to what extent has your arm, shoulder or hand problem interfered with your normal social activities with family, friends, neighbors, or groups?  Quite a bit    During the past week, to what extent has your arm, shoulder or hand problem limited your work or other regular daily activities  Modererately    Arm, shoulder, or hand pain.  Moderate    Tingling (pins and needles) in your arm, shoulder, or hand  Moderate    Difficulty Sleeping  Severe difficulty    DASH Score  59.09 %        Objective measurements completed on examination: See above findings.              PT Education - 08/09/18 0921    Education Details  lymphedema  risk reduction practices, ABC class flyer, post op breast exercises and avoiding shoulder hike, anatomy and physiology of lymphatic system    Person(s) Educated  Patient    Methods  Explanation;Handout    Comprehension  Verbalized understanding          PT Long Term Goals - 08/09/18 1157      PT LONG TERM GOAL #1   Title  Pt to demonstrate 160 degrees of L shoulder flexion to allow her to reach overhead and to allow pt to get in position for radiation    Baseline  95    Time  4    Period  Weeks    Status  New    Target Date  09/06/18      PT LONG TERM GOAL #2   Title  Pt to demonstraste 160 degrees of L shoulder abduction to allow her to reach out to the side and to allow pt to get in position for radiation    Baseline  62    Time  4    Period  Weeks    Status  New    Target Date  09/06/18      PT LONG TERM GOAL #3   Title  Pt to be independent in a home exercise program for continued strengthening and stretching    Time  4    Period  Weeks    Status  New    Target Date  09/06/18      PT LONG TERM GOAL #4   Title  Pt to obtain sleeve and glove for prophylactic use when flying to decrease risk of lymphedema    Time  4    Period  Weeks    Status  New    Target Date  09/06/18      PT LONG TERM GOAL #5   Title  Pt to report a 50% improvement in pain when moving left shoulder to allow improved comfort    Time  4    Period  Weeks    Status  New    Target Date  09/06/18             Plan - 08/09/18 0917    Clinical Impression Statement  Pt present to PT with decreased left shoulder ROM, pain and decreased strength following a bilateral mastectomy and ALND on left for treatment of left breast cancer. Pt has completed chemotherapy and will begin radiation soon. She will need increased left shoulder ROM in order to be positioned for left ROM. She has increased pain with left shoulder ROM in all directions. She would benefit from skilled PT services to increase bilateral  shoulder ROM and strength ( left worse than right) to allow pt to be positioned for radiation and progress pt towards independence with a home exercise program.     History and Personal Factors relevant to plan of care:  pt is right handed, recent surgery, 2 lymph node surgeries    Clinical Presentation  Evolving    Clinical Presentation due to:  pt to start radiation soon    Clinical Decision Making  Moderate    Rehab Potential  Good    Clinical Impairments Affecting Rehab Potential  pt to begin radiation soon    PT Frequency  2x / week    PT Duration  4 weeks    PT Treatment/Interventions  ADLs/Self Care Home Management;Therapeutic activities;Therapeutic exercise;Manual techniques;Patient/family education;Scar mobilization;Passive range of motion;Joint Manipulations    PT Next Visit Plan  begin gentle PROM to bilateral shoulders, left worse than right, give supine cane exercises, eventually pulleys and ball    PT Home Exercise Plan  post op breast exercises    Consulted and Agree with Plan of Care  Patient       Patient will benefit from skilled therapeutic intervention in order to improve the following deficits and impairments:  Pain, Increased fascial restricitons, Decreased scar mobility, Decreased range of motion, Decreased strength, Impaired UE functional use, Decreased knowledge of precautions  Visit Diagnosis: Stiffness of left shoulder, not elsewhere classified  Acute pain of left shoulder  Stiffness of right shoulder, not elsewhere classified  Muscle weakness (generalized)  Aftercare following surgery for neoplasm     Problem List Patient Active Problem List   Diagnosis Date Noted  . Breast cancer, left ([place]) 07/02/2018  . Chemotherapy induced neutropenia ([place]) 06/06/2018  . Port-A-Cath in place 04/20/2018  . Genetic testing 01/29/2018  . Family history of breast cancer   . Family  history of prostate cancer   . Family history of ovarian cancer   . Family history  of pancreatic cancer   . Malignant neoplasm of upper-outer quadrant of left breast in female, estrogen receptor positive ([place]) 01/04/2018    Allyson Sabal [facility] 08/09/2018, 12:01 PM  [place], [place], [address] Phone: [phone]   Fax:  [phone]  Name: Kristi [name] MRN: [id number] Date of Birth: 1976/05/04  Manus Gunning, PT 08/09/18 12:02 PM

## 2018-08-10 ENCOUNTER — Inpatient Hospital Stay: Payer: Commercial Managed Care - PPO | Attending: Hematology and Oncology

## 2018-08-10 DIAGNOSIS — Z5111 Encounter for antineoplastic chemotherapy: Secondary | ICD-10-CM | POA: Insufficient documentation

## 2018-08-10 DIAGNOSIS — C50412 Malignant neoplasm of upper-outer quadrant of left female breast: Secondary | ICD-10-CM

## 2018-08-10 DIAGNOSIS — M25611 Stiffness of right shoulder, not elsewhere classified: Secondary | ICD-10-CM | POA: Insufficient documentation

## 2018-08-10 DIAGNOSIS — M6281 Muscle weakness (generalized): Secondary | ICD-10-CM | POA: Insufficient documentation

## 2018-08-10 DIAGNOSIS — M25612 Stiffness of left shoulder, not elsewhere classified: Secondary | ICD-10-CM | POA: Insufficient documentation

## 2018-08-10 DIAGNOSIS — Z483 Aftercare following surgery for neoplasm: Secondary | ICD-10-CM | POA: Insufficient documentation

## 2018-08-10 DIAGNOSIS — Z170 Estrogen receptor positive status [ER+]: Secondary | ICD-10-CM

## 2018-08-10 DIAGNOSIS — M25512 Pain in left shoulder: Secondary | ICD-10-CM | POA: Insufficient documentation

## 2018-08-10 MED ORDER — GOSERELIN ACETATE 3.6 MG ~~LOC~~ IMPL
3.6000 mg | DRUG_IMPLANT | Freq: Once | SUBCUTANEOUS | Status: AC
  Administered 2018-08-10: 3.6 mg via SUBCUTANEOUS

## 2018-08-10 MED ORDER — GOSERELIN ACETATE 3.6 MG ~~LOC~~ IMPL
DRUG_IMPLANT | SUBCUTANEOUS | Status: AC
  Filled 2018-08-10: qty 3.6

## 2018-08-10 NOTE — Patient Instructions (Signed)
Goserelin injection What is this medicine? GOSERELIN (GOE se rel in) is similar to a hormone found in the body. It lowers the amount of sex hormones that the body makes. Men will have lower testosterone levels and women will have lower estrogen levels while taking this medicine. In men, this medicine is used to treat prostate cancer; the injection is either given once per month or once every 12 weeks. A once per month injection (only) is used to treat women with endometriosis, dysfunctional uterine bleeding, or advanced breast cancer. This medicine may be used for other purposes; ask your health care provider or pharmacist if you have questions. COMMON BRAND NAME(S): Zoladex What should I tell my health care provider before I take this medicine? They need to know if you have any of these conditions (some only apply to women): -diabetes -heart disease or previous heart attack -high blood pressure -high cholesterol -kidney disease -osteoporosis or low bone density -problems passing urine -spinal cord injury -stroke -tobacco smoker -an unusual or allergic reaction to goserelin, hormone therapy, other medicines, foods, dyes, or preservatives -pregnant or trying to get pregnant -breast-feeding How should I use this medicine? This medicine is for injection under the skin. It is given by a health care professional in a hospital or clinic setting. Men receive this injection once every 4 weeks or once every 12 weeks. Women will only receive the once every 4 weeks injection. Talk to your pediatrician regarding the use of this medicine in children. Special care may be needed. Overdosage: If you think you have taken too much of this medicine contact a poison control center or emergency room at once. NOTE: This medicine is only for you. Do not share this medicine with others. What if I miss a dose? It is important not to miss your dose. Call your doctor or health care professional if you are unable to  keep an appointment. What may interact with this medicine? -female hormones like estrogen -herbal or dietary supplements like black cohosh, chasteberry, or DHEA -female hormones like testosterone -prasterone This list may not describe all possible interactions. Give your health care provider a list of all the medicines, herbs, non-prescription drugs, or dietary supplements you use. Also tell them if you smoke, drink alcohol, or use illegal drugs. Some items may interact with your medicine. What should I watch for while using this medicine? Visit your doctor or health care professional for regular checks on your progress. Your symptoms may appear to get worse during the first weeks of this therapy. Tell your doctor or healthcare professional if your symptoms do not start to get better or if they get worse after this time. Your bones may get weaker if you take this medicine for a long time. If you smoke or frequently drink alcohol you may increase your risk of bone loss. A family history of osteoporosis, chronic use of drugs for seizures (convulsions), or corticosteroids can also increase your risk of bone loss. Talk to your doctor about how to keep your bones strong. This medicine should stop regular monthly menstration in women. Tell your doctor if you continue to menstrate. Women should not become pregnant while taking this medicine or for 12 weeks after stopping this medicine. Women should inform their doctor if they wish to become pregnant or think they might be pregnant. There is a potential for serious side effects to an unborn child. Talk to your health care professional or pharmacist for more information. Do not breast-feed an infant while taking   this medicine. Men should inform their doctors if they wish to father a child. This medicine may lower sperm counts. Talk to your health care professional or pharmacist for more information. What side effects may I notice from receiving this  medicine? Side effects that you should report to your doctor or health care professional as soon as possible: -allergic reactions like skin rash, itching or hives, swelling of the face, lips, or tongue -bone pain -breathing problems -changes in vision -chest pain -feeling faint or lightheaded, falls -fever, chills -pain, swelling, warmth in the leg -pain, tingling, numbness in the hands or feet -signs and symptoms of low blood pressure like dizziness; feeling faint or lightheaded, falls; unusually weak or tired -stomach pain -swelling of the ankles, feet, hands -trouble passing urine or change in the amount of urine -unusually high or low blood pressure -unusually weak or tired Side effects that usually do not require medical attention (report to your doctor or health care professional if they continue or are bothersome): -change in sex drive or performance -changes in breast size in both males and females -changes in emotions or moods -headache -hot flashes -irritation at site where injected -loss of appetite -skin problems like acne, dry skin -vaginal dryness This list may not describe all possible side effects. Call your doctor for medical advice about side effects. You may report side effects to FDA at 1-800-FDA-1088. Where should I keep my medicine? This drug is given in a hospital or clinic and will not be stored at home. NOTE: This sheet is a summary. It may not cover all possible information. If you have questions about this medicine, talk to your doctor, pharmacist, or health care provider.  2019 Elsevier/Gold Standard (2013-09-17 11:10:35)  

## 2018-08-14 ENCOUNTER — Ambulatory Visit: Payer: Commercial Managed Care - PPO | Admitting: Physical Therapy

## 2018-08-14 ENCOUNTER — Other Ambulatory Visit: Payer: Self-pay

## 2018-08-14 ENCOUNTER — Telehealth: Payer: Self-pay | Admitting: *Deleted

## 2018-08-14 ENCOUNTER — Encounter: Payer: Self-pay | Admitting: Physical Therapy

## 2018-08-14 ENCOUNTER — Inpatient Hospital Stay: Payer: Commercial Managed Care - PPO | Admitting: Hematology and Oncology

## 2018-08-14 DIAGNOSIS — C773 Secondary and unspecified malignant neoplasm of axilla and upper limb lymph nodes: Secondary | ICD-10-CM

## 2018-08-14 DIAGNOSIS — R1013 Epigastric pain: Secondary | ICD-10-CM | POA: Diagnosis not present

## 2018-08-14 DIAGNOSIS — M25612 Stiffness of left shoulder, not elsewhere classified: Secondary | ICD-10-CM

## 2018-08-14 DIAGNOSIS — C50412 Malignant neoplasm of upper-outer quadrant of left female breast: Secondary | ICD-10-CM | POA: Diagnosis not present

## 2018-08-14 DIAGNOSIS — M25611 Stiffness of right shoulder, not elsewhere classified: Secondary | ICD-10-CM

## 2018-08-14 DIAGNOSIS — Z170 Estrogen receptor positive status [ER+]: Secondary | ICD-10-CM | POA: Diagnosis not present

## 2018-08-14 DIAGNOSIS — Z9013 Acquired absence of bilateral breasts and nipples: Secondary | ICD-10-CM

## 2018-08-14 DIAGNOSIS — M25512 Pain in left shoulder: Secondary | ICD-10-CM

## 2018-08-14 DIAGNOSIS — Z5111 Encounter for antineoplastic chemotherapy: Secondary | ICD-10-CM | POA: Diagnosis not present

## 2018-08-14 MED ORDER — PANTOPRAZOLE SODIUM 40 MG PO TBEC: 40.0000 mg | DELAYED_RELEASE_TABLET | Freq: Two times a day (BID) | ORAL | 0 refills | Status: DC

## 2018-08-14 NOTE — Progress Notes (Signed)
Patient Care Team: Blair Heys, PA-C as PCP - General (Physician Assistant) Nicholas Lose, MD as Consulting Physician (Hematology and Oncology) Rolm Bookbinder, MD as Consulting Physician (General Surgery) Delice Bison [name], NP as Nurse Practitioner (Hematology and Oncology)  DIAGNOSIS:  Encounter Diagnosis  Name Primary?  . Malignant neoplasm of upper-outer quadrant of left breast in female, estrogen receptor positive ([place])     SUMMARY OF ONCOLOGIC HISTORY:   Malignant neoplasm of upper-outer quadrant of left breast in female, estrogen receptor positive ([place])   01/02/2018 Initial Diagnosis    Palpable lump left breast, mammogram: Left breast UOQ 2.3 cm mass, axilla has several enlarged lymph nodes; 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    01/04/2018 Cancer Staging    Staging form: Breast, AJCC 8th Edition - Clinical: Stage IIB (cT2, cN1, cM0, G3, ER+, PR+, HER2: Equivocal) - Signed by Nicholas Lose, MD on 01/04/2018    01/16/2018 - 06/18/2018 Neo-Adjuvant Chemotherapy    Dose dense Adriamycin and Cytoxan x4 followed by Taxol, switched to Abraxane with cycle 2 after Taxol reaction with palpitations weekly x12    01/19/2018 Genetic Testing    STK11 c.608C>T VUS identified on the Multicancer panel.  The Multi-Gene Panel offered by [facility] includes sequencing and/or deletion duplication testing of the following 83 genes: ALK, APC, ATM, AXIN2,BAP1,  BARD1, BLM, BMPR1A, BRCA1, BRCA2, BRIP1, CASR, CDC73, CDH1, CDK4, CDKN1B, CDKN1C, CDKN2A (p14ARF), CDKN2A (p16INK4a), CEBPA, CHEK2, CTNNA1, DICER1, DIS3L2, EGFR (c.2369C>T, p.Thr790Met variant only), EPCAM (Deletion/duplication testing only), FH, FLCN, GATA2, GPC3, GREM1 (Promoter region deletion/duplication testing only), HOXB13 (c.251G>A, p.Gly84Glu), HRAS, KIT, MAX, MEN1, MET, MITF (c.952G>A, p.Glu318Lys variant only), MLH1,  MSH2, MSH3, MSH6, MUTYH, NBN, NF1, NF2, NTHL1, PALB2, PDGFRA, PHOX2B, PMS2, POLD1, POLE, POT1, PRKAR1A, PTCH1, PTEN, RAD50, RAD51C, RAD51D, RB1, RECQL4, RET, RUNX1, SDHAF2, SDHA (sequence changes only), SDHB, SDHC, SDHD, SMAD4, SMARCA4, SMARCB1, SMARCE1, STK11, SUFU, TERT, TERT, TMEM127, TP53, TSC1, TSC2, VHL, WRN and WT1.  The report date is January 19, 2018.    06/13/2018 Breast MRI    Significant improvement of left breast cancer previously 2.4 x 3.2 x 4 cm, currently 1.4 x 1.3 x 0.8 cm, no lymph nodes    07/02/2018 Surgery    Bilateral mastectomies: Left mastectomy: IDC grade 2, 0.9 cm, margins negative, 1/4 lymph nodes positive with extracapsular extension, margins negative ypT1b ypN1 ER 75% strong, PR 70% strong, HER-2 negative, Ki-67 60%    07/24/2018 Surgery    Left axillary lymph node dissection: 0/4 lymph nodes     CHIEF COMPLIANT: Abdominal pain  INTERVAL HISTORY: Kristi [name] is a 43 year old with above-mentioned history of left breast cancer treated with neoadjuvant chemotherapy followed bilateral mastectomies and left axillary lymph node dissection.  She came in complaining of abdominal pain that is been going on for the past week.  It is epigastric and is mostly dull but occasional sharp impulses.  It is not radiating to the back.  There are no clear aggravating or relieving factors other than eating food appears to make the symptoms worse.  Denies any nausea or vomiting denies any fevers or chills.  REVIEW OF SYSTEMS:   Constitutional: Denies fevers, chills or abnormal weight loss Eyes: Denies blurriness of vision Ears, nose, mouth, throat, and face: Denies mucositis or sore throat Respiratory: Denies cough, dyspnea or wheezes Cardiovascular: Denies palpitation, chest discomfort Gastrointestinal:  Denies nausea, heartburn or change in bowel  habits Skin: Denies abnormal skin rashes Lymphatics: Denies new lymphadenopathy or easy bruising Neurological:Denies numbness,  tingling or new weaknesses Behavioral/Psych: Mood is stable, no new changes  Extremities: No lower extremity edema   All other systems were reviewed with the patient and are negative.  I have reviewed the past medical history, past surgical history, social history and family history with the patient and they are unchanged from previous note.  ALLERGIES:  is allergic to sulfa antibiotics; chlorhexidine gluconate; and other.  MEDICATIONS:  Current Outpatient Medications  Medication Sig Dispense Refill  . B-2-400 400 MG CAPS Take 400 mg by mouth 2 (two) times daily.    . baclofen (LIORESAL) 10 MG tablet Take 10 mg by mouth 2 (two) times daily as needed (headaches/spasms.).     Marland Kitchen buPROPion (WELLBUTRIN) 75 MG tablet Take 75 mg by mouth 2 (two) times daily.    . Calcium Carbonate-Vitamin D3 (CALCIUM 600-D) 600-400 MG-UNIT TABS Take 1 tablet by mouth daily.     Marland Kitchen doxycycline (VIBRAMYCIN) 50 MG capsule Take 1 capsule (50 mg total) by mouth 2 (two) times daily. (Patient not taking: Reported on 07/31/2018) 14 capsule 0  . eletriptan (RELPAX) 40 MG tablet Take 40 mg by mouth every 2 (two) hours as needed for migraine (max 2 doses per 24 hrs.).   3  . EMGALITY 120 MG/ML SOAJ Inject 120 mg into the skin every 30 (thirty) days.     . fexofenadine (ALLEGRA) 180 MG tablet Take 180 mg by mouth daily.    Marland Kitchen gabapentin (NEURONTIN) 300 MG capsule Take 1 capsule (300 mg total) by mouth at bedtime. 90 capsule 3  . Goserelin Acetate (ZOLADEX [place]) Inject 1 Dose into the skin every 30 (thirty) days.     Marland Kitchen HYDROcodone-acetaminophen (NORCO) 10-325 MG tablet Take 1 tablet by mouth every 6 (six) hours as needed for moderate pain.     Marland Kitchen LORazepam (ATIVAN) 1 MG tablet Take 1 tablet (1 mg total) by mouth 3 (three) times daily as needed (agitation/restlessness). 15 tablet 0  . Magnesium 500 MG CAPS Take 500 mg by mouth 2 (two) times daily.    . Multiple Vitamin (MULTIVITAMIN WITH MINERALS) TABS tablet Take 1 tablet by mouth  daily. Women's One A Day Multivitamin    . Multiple Vitamins-Minerals (HAIR SKIN AND NAILS FORMULA PO) Take 1 tablet by mouth daily.     . ondansetron (ZOFRAN) 8 MG tablet Take 8 mg by mouth 2 (two) times daily as needed for nausea or vomiting.     Marland Kitchen oxyCODONE (OXY IR/ROXICODONE) 5 MG immediate release tablet Take 1 tablet (5 mg total) by mouth every 6 (six) hours as needed for moderate pain, severe pain or breakthrough pain. (Patient not taking: Reported on 08/09/2018) 15 tablet 0  . oxyCODONE (ROXICODONE) 5 MG immediate release tablet Take 1-2 tablets (5-10 mg total) by mouth every 4 (four) hours as needed. (Patient not taking: Reported on 07/31/2018) 40 tablet 0  . pantoprazole (PROTONIX) 40 MG tablet Take 1 tablet (40 mg total) by mouth 2 (two) times daily. 60 tablet 0  . prochlorperazine (COMPAZINE) 10 MG tablet Take 10 mg by mouth every 6 (six) hours as needed for nausea or vomiting.    . promethazine (PHENERGAN) 25 MG tablet Take 25 mg by mouth every 6 (six) hours as needed for nausea or vomiting.    . Soft Lens Products (REWETTING DROPS) SOLN Place 1 drop into both eyes 3 (three) times daily as needed (contact lenses.).  No current facility-administered medications for this visit.    Facility-Administered Medications Ordered in Other Visits  Medication Dose Route Frequency Provider Last Rate Last Dose  . sodium chloride flush (NS) 0.9 % injection 10 mL  10 mL Intravenous PRN Nicholas Lose, MD   10 mL at 04/13/18 1529    PHYSICAL EXAMINATION: ECOG PERFORMANCE STATUS: 1 - Symptomatic but completely ambulatory  Vitals:   08/14/18 1446  BP: 130/85  Pulse: (!) 117  Resp: 18  Temp: 98.2 F (36.8 C)  SpO2: 100%   Filed Weights   08/14/18 1446  Weight: 148 lb 3.2 oz (67.2 kg)    GENERAL:alert, no distress and comfortable SKIN: skin color, texture, turgor are normal, no rashes or significant lesions EYES: normal, Conjunctiva are pink and non-injected, sclera clear OROPHARYNX:no  exudate, no erythema and lips, buccal mucosa, and tongue normal  NECK: supple, thyroid normal size, non-tender, without nodularity LYMPH:  no palpable lymphadenopathy in the cervical, axillary or inguinal LUNGS: clear to auscultation and percussion with normal breathing effort HEART: regular rate & rhythm and no murmurs and no lower extremity edema ABDOMEN epigastric tenderness.  There is no Murphy sign. MUSCULOSKELETAL:no cyanosis of digits and no clubbing  NEURO: alert & oriented x 3 with fluent speech, no focal motor/sensory deficits EXTREMITIES: No lower extremity edema   LABORATORY DATA:  I have reviewed the data as listed CMP Latest Ref Rng & Units 07/31/2018 07/24/2018 06/18/2018  Glucose 70 - 99 mg/dL 107(H) 72 103(H)  BUN 6 - 20 mg/dL 11 8 11   Creatinine 0.44 - 1.00 mg/dL 0.79 0.74 0.76  Sodium 135 - 145 mmol/L 141 142 142  Potassium 3.5 - 5.1 mmol/L 3.5 3.5 3.7  Chloride 98 - 111 mmol/L 104 105 106  CO2 22 - 32 mmol/L 23 26 27   Calcium 8.9 - 10.3 mg/dL 10.3 9.5 9.6  Total Protein 6.5 - 8.1 g/dL 7.6 - 7.1  Total Bilirubin 0.3 - 1.2 mg/dL 1.0 - 0.3  Alkaline Phos 38 - 126 U/L 76 - 79  AST 15 - 41 U/L 25 - 33  ALT 0 - 44 U/L 28 - 93(H)    Lab Results  Component Value Date   WBC 11.8 (H) 07/31/2018   HGB 13.6 07/31/2018   HCT 41.3 07/31/2018   MCV 88.8 07/31/2018   PLT 457 (H) 07/31/2018   NEUTROABS 2.9 06/18/2018    ASSESSMENT & PLAN:  Malignant neoplasm of upper-outer quadrant of left breast in female, estrogen receptor positive ([place]) 01/02/2018: Palpable lump left breastsince December 2018, mammogram: Left breast UOQ 2.3 cm mass, axilla has several enlarged lymph nodes; 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  Status post neoadjuvant chemotherapy with dose dense Adriamycin Cytoxan x4 followed by Abraxane weekly x12  07/02/2018:Bilateral mastectomies: Left  mastectomy: IDC grade 2, 0.9 cm, margins negative, 1/4 lymph nodes positive with extracapsular extension, margins negative ypT1b ypN1 ER 75% strong, PR 10% strong, HER-2 negative, Ki-67 60% 07/23/2018: Left axillary lymph node dissection: 0/4 LN  Recommendation: 1.  Adjuvant radiation therapy 2.  Followed by adjuvant antiestrogen therapy Consideration for clinical trial [name] --------------------------------------------------------------------------------------------------------------- Recurrent abdominal pain: I suspect this is related to gastritis.  However gallbladder etiology cannot be entirely ruled out.  I prescribed her Protonix twice a day.  If her symptoms do not get better in a week then we may have to do an ultrasound of the  abdomen. I instructed her to call us about her symptoms in a week.  Return to clinic after radiation therapy to discuss antiestrogen therapy treatment options.   No orders of the defined types were placed in this encounter.  The patient has a good understanding of the overall plan. she agrees with it. she will call with any problems that may develop before the next visit here.   Harriette Ohara, MD 08/14/18

## 2018-08-14 NOTE — Telephone Encounter (Signed)
Pt called with c/o abdominal pain x10 days after "I eat anything". Pt informed having normal bowel movements. Denies burning or reflux symptoms. Pt scheduled to see Dr. Lindi Adie on 08/14/18 at 2:30. Confirmed appt.

## 2018-08-14 NOTE — Patient Instructions (Signed)
Shoulder: Flexion (Supine)    With hands shoulder width apart, slowly lower dowel to floor behind head. Do not let elbows bend. Keep back flat. Hold  5-15____ seconds. Repeat __10__ times. Do __2__ sessions per day. CAUTION: Stretch slowly and gently.  Copyright  VHI. All rights reserved.  Shoulder: Abduction (Supine)    With right arm flat on floor, hold dowel in palm. Slowly move arm up to side of head by pushing with opposite arm. Do not let elbow bend. Hold 5-15____ seconds. Repeat _10___ times. Do __2__ sessions per day. Repeat on left side. CAUTION: Stretch slowly and gently.  Copyright  VHI. All rights reserved.

## 2018-08-14 NOTE — Assessment & Plan Note (Signed)
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  Status post neoadjuvant chemotherapy with dose dense Adriamycin Cytoxan x4 followed by Abraxane weekly x12  07/02/2018:Bilateral mastectomies: Left mastectomy: IDC grade 2, 0.9 cm, margins negative, 1/4 lymph nodes positive with extracapsular extension, margins negative ypT1b ypN1 ER 75% strong, PR 10% strong, HER-2 negative, Ki-67 60% 07/23/2018: Left axillary lymph node dissection: 0/4 LN  Recommendation: 1.  Adjuvant radiation therapy 2.  Followed by adjuvant antiestrogen therapy Consideration for clinical trial [name] --------------------------------------------------------------------------------------------------------------- Recurrent abdominal pain:   Return to clinic after radiation therapy to discuss antiestrogen therapy treatment options.

## 2018-08-14 NOTE — Therapy (Addendum)
[place], [place], [address] Phone: [phone]   Fax:  [phone]  Physical Therapy Treatment  Patient Details  Name: Kristi [name] MRN: [id number] Date of Birth: 1976-06-18 Referring Provider (PT): Dr. Donne Hazel   Encounter Date: 08/14/2018  PT End of Session - 08/14/18 0939    Visit Number  2    Number of Visits  9    Date for PT Re-Evaluation  09/06/18    Authorization Type  [facility]- no auth    PT Start Time  [phone]    PT Stop Time  0932    PT Time Calculation (min)  41 min    Activity Tolerance  Patient tolerated treatment well    Behavior During Therapy  [facility] for tasks assessed/performed       Past Medical History:  Diagnosis Date  . Breast cancer, left ([place]) 12/2017  . Endometriosis   . Family history of breast cancer   . Family history of ovarian cancer   . Family history of pancreatic cancer   . Family history of prostate cancer   . History of chemotherapy    finished chemo 06/18/2018  . Migraines   . PONV (postoperative nausea and vomiting)   . Wears contact lenses     Past Surgical History:  Procedure Laterality Date  . AXILLARY LYMPH NODE DISSECTION Left 07/24/2018  . AXILLARY LYMPH NODE DISSECTION Left 07/24/2018   Procedure: LEFT AXILLARY LYMPH NODE DISSECTION;  Surgeon: Rolm Bookbinder, MD;  Location: [place];  Service: General;  Laterality: Left;  GENERAL WITH PEC BLOCK  . BREAST RECONSTRUCTION WITH PLACEMENT OF TISSUE EXPANDER AND ALLODERM Bilateral 07/02/2018   Procedure: BILATERAL BREAST RECONSTRUCTION WITH PLACEMENT OF TISSUE EXPANDER AND ACELLULAR DERMIS;  Surgeon: Irene Limbo, MD;  Location: [place];  Service: Plastics;  Laterality: Bilateral;  . COLONOSCOPY    . MASTECTOMY WITH RADIOACTIVE SEED GUIDED EXCISION AND AXILLARY SENTINEL LYMPH NODE BIOPSY Bilateral 07/02/2018   Procedure: BILATERAL NIPPLE SPARING MASTECTOMIES WITH LEFT  AXILLARY SENTINEL LYMPH NODE BIOPSY AND LEFT SEED GUIDED NODE EXCISION;  Surgeon: Rolm Bookbinder, MD;  Location: [place];  Service: General;  Laterality: Bilateral;  . NASAL POLYP EXCISION    . OVARIAN CYST REMOVAL    . PORTA CATH REMOVAL Right 07/02/2018   Procedure: PORTA CATH REMOVAL;  Surgeon: Rolm Bookbinder, MD;  Location: [place];  Service: General;  Laterality: Right;  . PORTACATH PLACEMENT Right 01/15/2018   Procedure: INSERTION PORT-A-CATH WITH [place];  Surgeon: Rolm Bookbinder, MD;  Location: [place];  Service: General;  Laterality: Right;  . WISDOM TOOTH EXTRACTION      There were no vitals filed for this visit.  Subjective Assessment - 08/14/18 0853    Subjective  I went to the ABC class yesterday and I found it helpful. I have been doing the exercises from the class. By last night I could not do anymore because i was having some pain.     Pertinent History  bilateral mastectomy on 07/02/18 for left breast cancer, SLNB then ALND on 07/24/18, pt has completed chemotherapy 16 treatments over 5 months, pt will begin radiation soon    Patient Stated Goals  to get arm in position for radiation, strengthening L UE    Currently in Pain?  No/denies    Pain Score  0-No pain         [facility] PT Assessment - 08/14/18 0001  AROM   Left Shoulder Flexion  124 Degrees    Left Shoulder ABduction  110 Degrees                   [facility] Adult PT Treatment/Exercise - 08/14/18 0001      Exercises   Exercises  Shoulder      Shoulder Exercises: Supine   Flexion  AAROM;Both;10 reps   with 5 sec hold, pt returned therapist demo   ABduction  AAROM;Both;10 reps   with 5 sec holds, pt returned therapist demo     Manual Therapy   Manual Therapy  Passive ROM;Soft tissue mobilization    Soft tissue mobilization  gently to L axilla in area of cording    Passive ROM  to bilateral shoulders though most time was spent on left  shoulder moving into flexion, abduction and ER with prolonged holds                  PT Long Term Goals - 08/09/18 1157      PT LONG TERM GOAL #1   Title  Pt to demonstrate 160 degrees of L shoulder flexion to allow her to reach overhead and to allow pt to get in position for radiation    Baseline  95    Time  4    Period  Weeks    Status  New    Target Date  09/06/18      PT LONG TERM GOAL #2   Title  Pt to demonstraste 160 degrees of L shoulder abduction to allow her to reach out to the side and to allow pt to get in position for radiation    Baseline  62    Time  4    Period  Weeks    Status  New    Target Date  09/06/18      PT LONG TERM GOAL #3   Title  Pt to be independent in a home exercise program for continued strengthening and stretching    Time  4    Period  Weeks    Status  New    Target Date  09/06/18      PT LONG TERM GOAL #4   Title  Pt to obtain sleeve and glove for prophylactic use when flying to decrease risk of lymphedema    Time  4    Period  Weeks    Status  New    Target Date  09/06/18      PT LONG TERM GOAL #5   Title  Pt to report a 50% improvement in pain when moving left shoulder to allow improved comfort    Time  4    Period  Weeks    Status  New    Target Date  09/06/18            Plan - 08/14/18 0935    Clinical Impression Statement  Instructed pt in AAROM exercises today for both arms. Pt demonstrates significant gains in L shoulder ROM today following PROM. Encouraged pt to use left shoulder frequently and perform all stretches twice a day.     Rehab Potential  Good    Clinical Impairments Affecting Rehab Potential  pt to begin radiation soon    PT Frequency  2x / week    PT Duration  4 weeks    PT Treatment/Interventions  ADLs/Self Care Home Management;Therapeutic activities;Therapeutic exercise;Manual techniques;Patient/family education;Scar mobilization;Passive range of motion;Joint Manipulations    PT Next  Visit  Plan  continue gentle PROM to bilateral shoulders, left worse than right, myofascial to cording in L armpit, eventually pulleys and ball    PT Home Exercise Plan  post op breast exercises, supine cane    Consulted and Agree with Plan of Care  Patient       Patient will benefit from skilled therapeutic intervention in order to improve the following deficits and impairments:  Pain, Increased fascial restricitons, Decreased scar mobility, Decreased range of motion, Decreased strength, Impaired UE functional use, Decreased knowledge of precautions  Visit Diagnosis: Stiffness of left shoulder, not elsewhere classified  Acute pain of left shoulder  Stiffness of right shoulder, not elsewhere classified     Problem List Patient Active Problem List   Diagnosis Date Noted  . Breast cancer, left ([place]) 07/02/2018  . Chemotherapy induced neutropenia ([place]) 06/06/2018  . Port-A-Cath in place 04/20/2018  . Genetic testing 01/29/2018  . Family history of breast cancer   . Family history of prostate cancer   . Family history of ovarian cancer   . Family history of pancreatic cancer   . Malignant neoplasm of upper-outer quadrant of left breast in female, estrogen receptor positive ([place]) 01/04/2018    Allyson Sabal [facility] 08/14/2018, 9:41 AM  [place], [place], [address] Phone: [phone]   Fax:  [phone]  Name: Kristi [name] MRN: [id number] Date of Birth: 02/04/1976  Manus Gunning, PT 08/14/18 9:41 AM

## 2018-08-16 ENCOUNTER — Encounter: Payer: Self-pay | Admitting: Physical Therapy

## 2018-08-16 ENCOUNTER — Other Ambulatory Visit: Payer: Self-pay

## 2018-08-16 ENCOUNTER — Other Ambulatory Visit: Payer: Self-pay | Admitting: General Surgery

## 2018-08-16 ENCOUNTER — Ambulatory Visit: Payer: Commercial Managed Care - PPO | Admitting: Physical Therapy

## 2018-08-16 DIAGNOSIS — Z5111 Encounter for antineoplastic chemotherapy: Secondary | ICD-10-CM | POA: Diagnosis not present

## 2018-08-16 DIAGNOSIS — R1011 Right upper quadrant pain: Secondary | ICD-10-CM

## 2018-08-16 DIAGNOSIS — M25512 Pain in left shoulder: Secondary | ICD-10-CM

## 2018-08-16 DIAGNOSIS — M25612 Stiffness of left shoulder, not elsewhere classified: Secondary | ICD-10-CM

## 2018-08-16 DIAGNOSIS — M25611 Stiffness of right shoulder, not elsewhere classified: Secondary | ICD-10-CM

## 2018-08-16 NOTE — Therapy (Signed)
[place], [place], [address] Phone: [phone]   Fax:  [phone]  Physical Therapy Treatment  Patient Details  Name: Kristi [name] MRN: [id number] Date of Birth: 1976-01-16 Referring Provider (PT): Dr. Donne Hazel   Encounter Date: 08/16/2018  PT End of Session - 08/16/18 0952    Visit Number  3    Number of Visits  9    Date for PT Re-Evaluation  09/06/18    Authorization Type  UMR- no auth    PT Start Time  0805    PT Stop Time  0845    PT Time Calculation (min)  40 min    Activity Tolerance  Patient tolerated treatment well    Behavior During Therapy  [facility] for tasks assessed/performed       Past Medical History:  Diagnosis Date  . Breast cancer, left ([place]) 12/2017  . Endometriosis   . Family history of breast cancer   . Family history of ovarian cancer   . Family history of pancreatic cancer   . Family history of prostate cancer   . History of chemotherapy    finished chemo 06/18/2018  . Migraines   . PONV (postoperative nausea and vomiting)   . Wears contact lenses     Past Surgical History:  Procedure Laterality Date  . AXILLARY LYMPH NODE DISSECTION Left 07/24/2018  . AXILLARY LYMPH NODE DISSECTION Left 07/24/2018   Procedure: LEFT AXILLARY LYMPH NODE DISSECTION;  Surgeon: Rolm Bookbinder, MD;  Location: [place];  Service: General;  Laterality: Left;  GENERAL WITH PEC BLOCK  . BREAST RECONSTRUCTION WITH PLACEMENT OF TISSUE EXPANDER AND ALLODERM Bilateral 07/02/2018   Procedure: BILATERAL BREAST RECONSTRUCTION WITH PLACEMENT OF TISSUE EXPANDER AND ACELLULAR DERMIS;  Surgeon: Irene Limbo, MD;  Location: [place];  Service: Plastics;  Laterality: Bilateral;  . COLONOSCOPY    . MASTECTOMY WITH RADIOACTIVE SEED GUIDED EXCISION AND AXILLARY SENTINEL LYMPH NODE BIOPSY Bilateral 07/02/2018   Procedure: BILATERAL NIPPLE SPARING MASTECTOMIES WITH LEFT  AXILLARY SENTINEL LYMPH NODE BIOPSY AND LEFT SEED GUIDED NODE EXCISION;  Surgeon: Rolm Bookbinder, MD;  Location: [facility];  Service: General;  Laterality: Bilateral;  . NASAL POLYP EXCISION    . OVARIAN CYST REMOVAL    . PORTA CATH REMOVAL Right 07/02/2018   Procedure: PORTA CATH REMOVAL;  Surgeon: Rolm Bookbinder, MD;  Location: [facility];  Service: General;  Laterality: Right;  . PORTACATH PLACEMENT Right 01/15/2018   Procedure: INSERTION PORT-A-CATH WITH [place];  Surgeon: Rolm Bookbinder, MD;  Location: [place];  Service: General;  Laterality: Right;  . WISDOM TOOTH EXTRACTION      There were no vitals filed for this visit.  Subjective Assessment - 08/16/18 0805    Subjective  My shoulder felt good after last time it was just a little sore. I have been trying to keep the range of motion gains from last session.     Pertinent History  bilateral mastectomy on 07/02/18 for left breast cancer, SLNB then ALND on 07/24/18, pt has completed chemotherapy 16 treatments over 5 months, pt will begin radiation soon    Patient Stated Goals  to get arm in position for radiation, strengthening L UE    Currently in Pain?  No/denies    Pain Score  0-No pain                       [facility] Adult PT Treatment/Exercise -  08/16/18 0001      Shoulder Exercises: Pulleys   Flexion  2 minutes   pt returned therapist demo   ABduction  2 minutes   pt returned therapist demo, cues to keep shoulder relaxed     Shoulder Exercises: Therapy Ball   Flexion  10 reps   with stretch at top, pt returned therapist demo   ABduction  Both;10 reps   pt returned therapist demo     Manual Therapy   Manual Therapy  Passive ROM;Soft tissue mobilization    Passive ROM  to left shoulder in direction of flexion, abduction, horizontal abduction and ER                  PT Long Term Goals - 08/09/18 1157      PT LONG TERM GOAL #1   Title  Pt to  demonstrate 160 degrees of L shoulder flexion to allow her to reach overhead and to allow pt to get in position for radiation    Baseline  95    Time  4    Period  Weeks    Status  New    Target Date  09/06/18      PT LONG TERM GOAL #2   Title  Pt to demonstraste 160 degrees of L shoulder abduction to allow her to reach out to the side and to allow pt to get in position for radiation    Baseline  62    Time  4    Period  Weeks    Status  New    Target Date  09/06/18      PT LONG TERM GOAL #3   Title  Pt to be independent in a home exercise program for continued strengthening and stretching    Time  4    Period  Weeks    Status  New    Target Date  09/06/18      PT LONG TERM GOAL #4   Title  Pt to obtain sleeve and glove for prophylactic use when flying to decrease risk of lymphedema    Time  4    Period  Weeks    Status  New    Target Date  09/06/18      PT LONG TERM GOAL #5   Title  Pt to report a 50% improvement in pain when moving left shoulder to allow improved comfort    Time  4    Period  Weeks    Status  New    Target Date  09/06/18            Plan - 08/16/18 0254    Clinical Impression Statement  Added pulleys and ball today and pt felt a good stretch with these. She continues to demonstrate improvement with her ROM especially of her left shoulder. Her cording is less palpable today. Continued with PROM today.     Rehab Potential  Good    Clinical Impairments Affecting Rehab Potential  pt to begin radiation soon    PT Frequency  2x / week    PT Duration  4 weeks    PT Treatment/Interventions  ADLs/Self Care Home Management;Therapeutic activities;Therapeutic exercise;Manual techniques;Patient/family education;Scar mobilization;Passive range of motion;Joint Manipulations    PT Next Visit Plan  continue gentle PROM to bilateral shoulders, left worse than right, myofascial to cording in L armpit, eventually give supine scap    PT Home Exercise Plan  post op  breast exercises, supine cane  Consulted and Agree with Plan of Care  Patient       Patient will benefit from skilled therapeutic intervention in order to improve the following deficits and impairments:  Pain, Increased fascial restricitons, Decreased scar mobility, Decreased range of motion, Decreased strength, Impaired UE functional use, Decreased knowledge of precautions  Visit Diagnosis: Stiffness of left shoulder, not elsewhere classified  Acute pain of left shoulder  Stiffness of right shoulder, not elsewhere classified     Problem List Patient Active Problem List   Diagnosis Date Noted  . Breast cancer, left ([place]) 07/02/2018  . Chemotherapy induced neutropenia ([place]) 06/06/2018  . Port-A-Cath in place 04/20/2018  . Genetic testing 01/29/2018  . Family history of breast cancer   . Family history of prostate cancer   . Family history of ovarian cancer   . Family history of pancreatic cancer   . Malignant neoplasm of upper-outer quadrant of left breast in female, estrogen receptor positive ([place]) 01/04/2018    Kristi [name] [facility] 08/16/2018, 10:06 AM  [place], [place], [address] Phone: [phone]   Fax:  [phone]  Name: Kristi [name] MRN: [id number] Date of Birth: 20-Feb-1976  Manus Gunning, PT 08/16/18 10:06 AM

## 2018-08-17 ENCOUNTER — Ambulatory Visit
Admission: RE | Admit: 2018-08-17 | Discharge: 2018-08-17 | Disposition: A | Discharge status: Home / Self Care | Payer: Commercial Managed Care - PPO | Source: Ambulatory Visit | Attending: General Surgery | Admitting: General Surgery

## 2018-08-17 DIAGNOSIS — R1011 Right upper quadrant pain: Secondary | ICD-10-CM

## 2018-08-20 ENCOUNTER — Other Ambulatory Visit: Payer: Self-pay

## 2018-08-20 ENCOUNTER — Ambulatory Visit: Payer: Commercial Managed Care - PPO | Admitting: Physical Therapy

## 2018-08-20 ENCOUNTER — Encounter: Payer: Self-pay | Admitting: Physical Therapy

## 2018-08-20 DIAGNOSIS — M25612 Stiffness of left shoulder, not elsewhere classified: Secondary | ICD-10-CM

## 2018-08-20 DIAGNOSIS — Z5111 Encounter for antineoplastic chemotherapy: Secondary | ICD-10-CM | POA: Diagnosis not present

## 2018-08-20 DIAGNOSIS — M25512 Pain in left shoulder: Secondary | ICD-10-CM

## 2018-08-20 NOTE — Therapy (Signed)
[place], [place], [address] Phone: [phone]   Fax:  [phone]  Physical Therapy Treatment  Patient Details  Name: Kristi [name] MRN: [id number] Date of Birth: 08-22-1975 Referring Provider (PT): Dr. Donne Hazel   Encounter Date: 08/20/2018  PT End of Session - 08/20/18 1035    Visit Number  4    Number of Visits  9    Date for PT Re-Evaluation  09/06/18    Authorization Type  UMR- no auth    PT Start Time  0851    PT Stop Time  0931    PT Time Calculation (min)  40 min    Activity Tolerance  Patient tolerated treatment well    Behavior During Therapy  [facility] for tasks assessed/performed       Past Medical History:  Diagnosis Date  . Breast cancer, left ([place]) 12/2017  . Endometriosis   . Family history of breast cancer   . Family history of ovarian cancer   . Family history of pancreatic cancer   . Family history of prostate cancer   . History of chemotherapy    finished chemo 06/18/2018  . Migraines   . PONV (postoperative nausea and vomiting)   . Wears contact lenses     Past Surgical History:  Procedure Laterality Date  . AXILLARY LYMPH NODE DISSECTION Left 07/24/2018  . AXILLARY LYMPH NODE DISSECTION Left 07/24/2018   Procedure: LEFT AXILLARY LYMPH NODE DISSECTION;  Surgeon: Rolm Bookbinder, MD;  Location: [facility];  Service: General;  Laterality: Left;  GENERAL WITH PEC BLOCK  . BREAST RECONSTRUCTION WITH PLACEMENT OF TISSUE EXPANDER AND ALLODERM Bilateral 07/02/2018   Procedure: BILATERAL BREAST RECONSTRUCTION WITH PLACEMENT OF TISSUE EXPANDER AND ACELLULAR DERMIS;  Surgeon: Irene Limbo, MD;  Location: [facility];  Service: Plastics;  Laterality: Bilateral;  . COLONOSCOPY    . MASTECTOMY WITH RADIOACTIVE SEED GUIDED EXCISION AND AXILLARY SENTINEL LYMPH NODE BIOPSY Bilateral 07/02/2018   Procedure: BILATERAL NIPPLE SPARING MASTECTOMIES WITH LEFT  AXILLARY SENTINEL LYMPH NODE BIOPSY AND LEFT SEED GUIDED NODE EXCISION;  Surgeon: Rolm Bookbinder, MD;  Location: [facility];  Service: General;  Laterality: Bilateral;  . NASAL POLYP EXCISION    . OVARIAN CYST REMOVAL    . PORTA CATH REMOVAL Right 07/02/2018   Procedure: PORTA CATH REMOVAL;  Surgeon: Rolm Bookbinder, MD;  Location: [facility];  Service: General;  Laterality: Right;  . PORTACATH PLACEMENT Right 01/15/2018   Procedure: INSERTION PORT-A-CATH WITH [place];  Surgeon: Rolm Bookbinder, MD;  Location: [facility];  Service: General;  Laterality: Right;  . WISDOM TOOTH EXTRACTION      There were no vitals filed for this visit.  Subjective Assessment - 08/20/18 0853    Subjective  My shoulder is moving better.     Pertinent History  bilateral mastectomy on 07/02/18 for left breast cancer, SLNB then ALND on 07/24/18, pt has completed chemotherapy 16 treatments over 5 months, pt will begin radiation soon    Patient Stated Goals  to get arm in position for radiation, strengthening L UE    Currently in Pain?  No/denies    Pain Score  0-No pain                       [facility] Adult PT Treatment/Exercise - 08/20/18 0001      Shoulder Exercises: Pulleys   Flexion  2 minutes   pt returned therapist  demo   ABduction  2 minutes   pt returned therapist demo, cues to keep shoulder relaxed     Shoulder Exercises: Therapy Ball   Flexion  10 reps   with stretch at top, pt returned therapist demo   ABduction  Both;10 reps   pt returned therapist demo     Manual Therapy   Manual Therapy  Passive ROM;Myofascial release    Myofascial Release  gently to left axilla in area of cording    Passive ROM  to left shoulder in direction of flexion, abduction, horizontal abduction and ER                  PT Long Term Goals - 08/09/18 1157      PT LONG TERM GOAL #1   Title  Pt to demonstrate 160 degrees of L shoulder flexion to  allow her to reach overhead and to allow pt to get in position for radiation    Baseline  95    Time  4    Period  Weeks    Status  New    Target Date  09/06/18      PT LONG TERM GOAL #2   Title  Pt to demonstraste 160 degrees of L shoulder abduction to allow her to reach out to the side and to allow pt to get in position for radiation    Baseline  62    Time  4    Period  Weeks    Status  New    Target Date  09/06/18      PT LONG TERM GOAL #3   Title  Pt to be independent in a home exercise program for continued strengthening and stretching    Time  4    Period  Weeks    Status  New    Target Date  09/06/18      PT LONG TERM GOAL #4   Title  Pt to obtain sleeve and glove for prophylactic use when flying to decrease risk of lymphedema    Time  4    Period  Weeks    Status  New    Target Date  09/06/18      PT LONG TERM GOAL #5   Title  Pt to report a 50% improvement in pain when moving left shoulder to allow improved comfort    Time  4    Period  Weeks    Status  New    Target Date  09/06/18            Plan - 08/20/18 1037    Clinical Impression Statement  Continued with PROM to left shoulder since she continues to have tightness in this area. She is most limited in direction of abduction. She is improving though with PROM and AAROM.     Rehab Potential  Good    Clinical Impairments Affecting Rehab Potential  pt to begin radiation soon    PT Frequency  2x / week    PT Duration  4 weeks    PT Treatment/Interventions  ADLs/Self Care Home Management;Therapeutic activities;Therapeutic exercise;Manual techniques;Patient/family education;Scar mobilization;Passive range of motion;Joint Manipulations    PT Next Visit Plan  PROM to left shoulder, myofascial to cording in L armpit, give supine scap    PT Home Exercise Plan  post op breast exercises, supine cane    Consulted and Agree with Plan of Care  Patient       Patient will benefit from skilled  therapeutic  intervention in order to improve the following deficits and impairments:  Pain, Increased fascial restricitons, Decreased scar mobility, Decreased range of motion, Decreased strength, Impaired UE functional use, Decreased knowledge of precautions  Visit Diagnosis: Stiffness of left shoulder, not elsewhere classified  Acute pain of left shoulder     Problem List Patient Active Problem List   Diagnosis Date Noted  . Breast cancer, left ([place]) 07/02/2018  . Chemotherapy induced neutropenia ([place]) 06/06/2018  . Port-A-Cath in place 04/20/2018  . Genetic testing 01/29/2018  . Family history of breast cancer   . Family history of prostate cancer   . Family history of ovarian cancer   . Family history of pancreatic cancer   . Malignant neoplasm of upper-outer quadrant of left breast in female, estrogen receptor positive ([place]) 01/04/2018    Allyson Sabal [facility] 08/20/2018, 10:40 AM  [place], [place], [address] Phone: [phone]   Fax:  [phone]  Name: Kristi [name] MRN: [id number] Date of Birth: Jul 28, 1975  Manus Gunning, PT 08/20/18 10:40 AM

## 2018-08-23 ENCOUNTER — Encounter: Payer: Self-pay | Admitting: Gastroenterology

## 2018-08-23 ENCOUNTER — Other Ambulatory Visit: Payer: Self-pay

## 2018-08-23 ENCOUNTER — Ambulatory Visit: Payer: Commercial Managed Care - PPO | Admitting: Physical Therapy

## 2018-08-23 ENCOUNTER — Encounter: Payer: Self-pay | Admitting: Physical Therapy

## 2018-08-23 DIAGNOSIS — M6281 Muscle weakness (generalized): Secondary | ICD-10-CM

## 2018-08-23 DIAGNOSIS — Z5111 Encounter for antineoplastic chemotherapy: Secondary | ICD-10-CM | POA: Diagnosis not present

## 2018-08-23 DIAGNOSIS — M25512 Pain in left shoulder: Secondary | ICD-10-CM

## 2018-08-23 DIAGNOSIS — M25612 Stiffness of left shoulder, not elsewhere classified: Secondary | ICD-10-CM

## 2018-08-23 NOTE — Patient Instructions (Signed)

## 2018-08-23 NOTE — Therapy (Signed)
[place], [place], [address] Phone: [phone]   Fax:  [phone]  Physical Therapy Treatment  Patient Details  Name: Kristi [name] MRN: [id number] Date of Birth: 30-Jan-1976 Referring Provider (PT): Dr. Donne Hazel   Encounter Date: 08/23/2018  PT End of Session - 08/23/18 1357    Visit Number  5    Number of Visits  9    Date for PT Re-Evaluation  09/06/18    Authorization Type  UMR- no auth    PT Start Time  1301    PT Stop Time  1350    PT Time Calculation (min)  49 min    Activity Tolerance  Patient tolerated treatment well    Behavior During Therapy  [facility] for tasks assessed/performed       Past Medical History:  Diagnosis Date  . Breast cancer, left ([place]) 12/2017  . Endometriosis   . Family history of breast cancer   . Family history of ovarian cancer   . Family history of pancreatic cancer   . Family history of prostate cancer   . History of chemotherapy    finished chemo 06/18/2018  . Migraines   . PONV (postoperative nausea and vomiting)   . Wears contact lenses     Past Surgical History:  Procedure Laterality Date  . AXILLARY LYMPH NODE DISSECTION Left 07/24/2018  . AXILLARY LYMPH NODE DISSECTION Left 07/24/2018   Procedure: LEFT AXILLARY LYMPH NODE DISSECTION;  Surgeon: Rolm Bookbinder, MD;  Location: [facility];  Service: General;  Laterality: Left;  GENERAL WITH PEC BLOCK  . BREAST RECONSTRUCTION WITH PLACEMENT OF TISSUE EXPANDER AND ALLODERM Bilateral 07/02/2018   Procedure: BILATERAL BREAST RECONSTRUCTION WITH PLACEMENT OF TISSUE EXPANDER AND ACELLULAR DERMIS;  Surgeon: Irene Limbo, MD;  Location: [place];  Service: Plastics;  Laterality: Bilateral;  . COLONOSCOPY    . MASTECTOMY WITH RADIOACTIVE SEED GUIDED EXCISION AND AXILLARY SENTINEL LYMPH NODE BIOPSY Bilateral 07/02/2018   Procedure: BILATERAL NIPPLE SPARING MASTECTOMIES WITH LEFT  AXILLARY SENTINEL LYMPH NODE BIOPSY AND LEFT SEED GUIDED NODE EXCISION;  Surgeon: Rolm Bookbinder, MD;  Location: [facility];  Service: General;  Laterality: Bilateral;  . NASAL POLYP EXCISION    . OVARIAN CYST REMOVAL    . PORTA CATH REMOVAL Right 07/02/2018   Procedure: PORTA CATH REMOVAL;  Surgeon: Rolm Bookbinder, MD;  Location: [place];  Service: General;  Laterality: Right;  . PORTACATH PLACEMENT Right 01/15/2018   Procedure: INSERTION PORT-A-CATH WITH [place];  Surgeon: Rolm Bookbinder, MD;  Location: [place];  Service: General;  Laterality: Right;  . WISDOM TOOTH EXTRACTION      There were no vitals filed for this visit.  Subjective Assessment - 08/23/18 1303    Subjective  My shoulder has been tight since the last session. Work has not been too bad. They let me leave when I want to.     Pertinent History  bilateral mastectomy on 07/02/18 for left breast cancer, SLNB then ALND on 07/24/18, pt has completed chemotherapy 16 treatments over 5 months, pt will begin radiation soon    Patient Stated Goals  to get arm in position for radiation, strengthening L UE    Currently in Pain?  No/denies    Pain Score  0-No pain                       [facility] Adult PT Treatment/Exercise - 08/23/18 0001  Shoulder Exercises: Supine   Horizontal ABduction  Strengthening;Both;10 reps   pt returned therapist demo   Theraband Level (Shoulder Horizontal ABduction)  Level 1 (Yellow)    External Rotation  Strengthening;Both;10 reps   pt returned therapist demo   Theraband Level (Shoulder External Rotation)  Level 1 (Yellow)    Flexion  Strengthening;Both;10 reps   narrow and wide grip, pt returned therapist demo   Theraband Level (Shoulder Flexion)  Level 1 (Yellow)    Diagonals  Strengthening;Both;10 reps   pt returned therapist demo   Theraband Level (Shoulder Diagonals)  Level 1 (Yellow)      Shoulder Exercises: Pulleys    Flexion  2 minutes   pt returned therapist demo   ABduction  2 minutes   pt returned therapist demo, cues to keep shoulder relaxed     Shoulder Exercises: Therapy Ball   Flexion  10 reps   with stretch at top, pt returned therapist demo   ABduction  Both;10 reps   pt returned therapist demo     Manual Therapy   Manual Therapy  Passive ROM    Passive ROM  to left shoulder in direction of flexion, abduction, horizontal abduction and ER                  PT Long Term Goals - 08/09/18 1157      PT LONG TERM GOAL #1   Title  Pt to demonstrate 160 degrees of L shoulder flexion to allow her to reach overhead and to allow pt to get in position for radiation    Baseline  95    Time  4    Period  Weeks    Status  New    Target Date  09/06/18      PT LONG TERM GOAL #2   Title  Pt to demonstraste 160 degrees of L shoulder abduction to allow her to reach out to the side and to allow pt to get in position for radiation    Baseline  62    Time  4    Period  Weeks    Status  New    Target Date  09/06/18      PT LONG TERM GOAL #3   Title  Pt to be independent in a home exercise program for continued strengthening and stretching    Time  4    Period  Weeks    Status  New    Target Date  09/06/18      PT LONG TERM GOAL #4   Title  Pt to obtain sleeve and glove for prophylactic use when flying to decrease risk of lymphedema    Time  4    Period  Weeks    Status  New    Target Date  09/06/18      PT LONG TERM GOAL #5   Title  Pt to report a 50% improvement in pain when moving left shoulder to allow improved comfort    Time  4    Period  Weeks    Status  New    Target Date  09/06/18            Plan - 08/23/18 1357    Clinical Impression Statement  Pt had increased tightness with left shoulder today. Focused on AAROM and PROM. Pt demonstrated improvements following PROM so instructed pt in supine scapular exercises and issued these as part of a home exercise  program.     Rehab  Potential  Good    Clinical Impairments Affecting Rehab Potential  pt to begin radiation soon    PT Frequency  2x / week    PT Duration  4 weeks    PT Treatment/Interventions  ADLs/Self Care Home Management;Therapeutic activities;Therapeutic exercise;Manual techniques;Patient/family education;Scar mobilization;Passive range of motion;Joint Manipulations    PT Next Visit Plan  assess goals, assess indep with supine scap, PROM to left shoulder, myofascial to cording in L armpit    PT Home Exercise Plan  post op breast exercises, supine cane, supine scap    Consulted and Agree with Plan of Care  Patient       Patient will benefit from skilled therapeutic intervention in order to improve the following deficits and impairments:  Pain, Increased fascial restricitons, Decreased scar mobility, Decreased range of motion, Decreased strength, Impaired UE functional use, Decreased knowledge of precautions  Visit Diagnosis: Stiffness of left shoulder, not elsewhere classified  Acute pain of left shoulder  Muscle weakness (generalized)     Problem List Patient Active Problem List   Diagnosis Date Noted  . Breast cancer, left ([place]) 07/02/2018  . Chemotherapy induced neutropenia ([place]) 06/06/2018  . Port-A-Cath in place 04/20/2018  . Genetic testing 01/29/2018  . Family history of breast cancer   . Family history of prostate cancer   . Family history of ovarian cancer   . Family history of pancreatic cancer   . Malignant neoplasm of upper-outer quadrant of left breast in female, estrogen receptor positive ([place]) 01/04/2018    Allyson Sabal [facility] 08/23/2018, 1:59 PM  [place], [place], [address] Phone: [phone]   Fax:  [phone]  Name: Kristi [name] MRN: [id number] Date of Birth: [birth year]  Manus Gunning, PT 08/23/18 2:00 PM

## 2018-08-24 ENCOUNTER — Telehealth: Payer: Self-pay

## 2018-08-24 ENCOUNTER — Other Ambulatory Visit: Payer: Self-pay | Admitting: General Surgery

## 2018-08-24 ENCOUNTER — Other Ambulatory Visit (HOSPITAL_COMMUNITY): Payer: Self-pay | Admitting: General Surgery

## 2018-08-24 DIAGNOSIS — R1011 Right upper quadrant pain: Secondary | ICD-10-CM

## 2018-08-24 NOTE — Progress Notes (Signed)
Location of Breast Cancer: Left Breast  Histology per Pathology Report:  01/02/18 Diagnosis 1. 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 - SEE COMMENT. 2. Lymph node, needle/core biopsy, left axilla - INVASIVE DUCTAL CARCINOMA.  Receptor Status: ER(75%), PR (70%), Her2-neu (NEG), Ki-(60%)  07/02/18 Diagnosis 1. Breast, simple mastectomy, Right - FIBROCYSTIC CHANGES. - THERE IS NO EVIDENCE OF MALIGNANCY. 2. Nipple Biopsy, Right - BENIGN BREAST PARENCHYMA. - THERE IS NO EVIDENCE OF MALIGNANCY. 3. 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 - EXTRACAPSULAR EXTENSION IS IDENTIFIED. 4. 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 - THE SURGICAL RESECTION MARGINS ARE NEGATIVE FOR CARCINOMA. - SEE ONCOLOGY TABLE BELOW. 5. Nipple Biopsy, Left Nipple - BENIGN BREAST PARENCHYMA. - THERE IS NO EVIDENCE OF MALIGNANCY.  Receptor Status: ER(70%), PR(10%), Her 2 (NEG)  07/24/18 Diagnosis Lymph nodes, regional resection, Left axillary - BENIGN FIBROVASCULAR AND ADIPOSE CONNECTIVE TISSUE, WITH LYMPHOCYTIC INFLAMMATORY INFILTRATE AND GIANT CELL REACTION, AND DEGENERATIVE CHANGES. NO MALIGNANCY IDENTIFIED. - 0 OF 4 LYMPH NODES POSITIVE FOR METASTATIC CARCINOMA.  Did patient present with symptoms or was this found on screening mammography?: She originally felt a lump December 2018. It was felt to be benign. The lump increased in size and she had a mammogram performed.   Past/Anticipated interventions by surgeon, if any: 07/02/18 Procedure: 1.  Right risk reducing nipple sparing mastectomy 2.  Port removal 3.  Left nipple sparing mastectomy 4.  Left deep axillary sentinel lymph node biopsy 5.  Left axillary radioactive seed guided excision of node Surgeon: Dr. Serita Grammes Assistant:  Dr. Arnoldo Hooker Thimmappa  07/02/18 :  1. Bilateral breast reconstruction with tissue expanders 2. Acellular dermis (Alloderm) for breast reconstruction 600 cm2 SURGEON: Irene Limbo MD [facility]  07/24/18 Procedure: Completion left axillary lymph node dissection Surgeon: Dr. Serita Grammes  Past/Anticipated interventions by medical oncology, if any: 01/02/2018: Palpable lump left breastsince December 2018, mammogram: Left breast UOQ 2.3 cm mass, axilla has several enlarged lymph nodes; 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  Status post neoadjuvant chemotherapy with dose dense Adriamycin Cytoxan x4 followed by Abraxane weekly x12  07/02/2018:Bilateral mastectomies: Left mastectomy: IDC grade 2, 0.9 cm, margins negative, 1/4 lymph nodes positive with extracapsular extension, margins negative ypT1b ypN1 ER 75% strong, PR 10% strong, HER-2 negative, Ki-67 60% 07/23/2018: Left axillary lymph node dissection: 0/4 LN  Recommendation: 1.  Adjuvant radiation therapy 2.  Followed by adjuvant antiestrogen therapy Consideration for clinical trial [name] --------------------------------------------------------------------------------------------------------------- Recurrent abdominal pain  Return to clinic after radiation therapy to discuss antiestrogen therapy treatment options  Lymphedema issues, if any:  She denies. She is working on arm mobility with PT.   Pain issues, if any: She reports recent "stomach problems" and is seeing a GI MD, Dr. Ardis Hughs.   SAFETY ISSUES:  Prior radiation? No  Pacemaker/ICD? No  Possible current pregnancy? 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   Is the patient on methotrexate? No  Current Complaints / other details:   BP 136/82 (BP Location: Right Arm, Patient Position: Sitting)   Pulse 93   Temp 97.8 F (36.6 C) (Oral)    Resp 18   Ht _0  (1.651 m)   Wt 139 lb 2 oz (63.1 kg)  SpO2 100%   BMI 23.15 kg/m    Wt Readings from Last 3 Encounters:  08/29/18 139 lb 2 oz (63.1 kg)  08/14/18 148 lb 3.2 oz (67.2 kg)  07/30/18 150 lb (68 kg)

## 2018-08-24 NOTE — Telephone Encounter (Signed)
The pt has been advised that appt was rescheduled to 08/31/18 at 245 pm. The pt has been advised of the information and verbalized understanding.

## 2018-08-24 NOTE — Telephone Encounter (Signed)
-----   Message from Milus Banister, MD sent at 08/24/2018  5:57 AM EST ----- Catalina Antigua, Happy to help. I"m sure I can get her in sooner than the 18th.  DJ    Kaaren Nass, Can you find a spot for her next week, afternoon office, double book if needed.  Thanks Dj ----- Message ----- From: Rolm Bookbinder, MD Sent: 08/23/2018   9:53 AM EST To: Milus Banister, MD  Dan This is young breast cancer patient of mine.  She had chemo and then just had bilateral mastectomies with reconstruction and alnd.  She is a healthy young woman outside of this.  She has been having ruq and epigastric pain last couple weeks. Nicholas Lose put her on ppi which has not helped. I sent her to get an [place] and she doesn't have stones.   May end up needing a hida but I wanted to see if you would see her and see if needs an egd.  She is really just doing liquids right now.  She has appt with you on feb 18 but if you have ability to see sooner I would really appreciate it. Thanks [facility]

## 2018-08-28 ENCOUNTER — Other Ambulatory Visit: Payer: Self-pay

## 2018-08-28 ENCOUNTER — Ambulatory Visit: Payer: Commercial Managed Care - PPO | Admitting: Physical Therapy

## 2018-08-28 ENCOUNTER — Encounter: Payer: Self-pay | Admitting: Physical Therapy

## 2018-08-28 DIAGNOSIS — Z483 Aftercare following surgery for neoplasm: Secondary | ICD-10-CM | POA: Insufficient documentation

## 2018-08-28 DIAGNOSIS — M25612 Stiffness of left shoulder, not elsewhere classified: Secondary | ICD-10-CM | POA: Insufficient documentation

## 2018-08-28 DIAGNOSIS — M25611 Stiffness of right shoulder, not elsewhere classified: Secondary | ICD-10-CM

## 2018-08-28 DIAGNOSIS — M6281 Muscle weakness (generalized): Secondary | ICD-10-CM

## 2018-08-28 DIAGNOSIS — M25512 Pain in left shoulder: Secondary | ICD-10-CM | POA: Insufficient documentation

## 2018-08-28 DIAGNOSIS — Z5111 Encounter for antineoplastic chemotherapy: Secondary | ICD-10-CM | POA: Diagnosis present

## 2018-08-28 DIAGNOSIS — C773 Secondary and unspecified malignant neoplasm of axilla and upper limb lymph nodes: Secondary | ICD-10-CM | POA: Diagnosis not present

## 2018-08-28 DIAGNOSIS — C50412 Malignant neoplasm of upper-outer quadrant of left female breast: Secondary | ICD-10-CM | POA: Diagnosis present

## 2018-08-28 NOTE — Therapy (Signed)
[place], [place], [address] Phone: [phone]   Fax:  [phone]  Physical Therapy Treatment  Patient Details  Name: Kristi [name] MRN: [id number] Date of Birth: [birth year] Referring Provider (PT): Dr. Donne Hazel   Encounter Date: 08/28/2018  PT End of Session - 08/28/18 1650    Visit Number  6    Number of Visits  9    Date for PT Re-Evaluation  09/06/18    Authorization Type  [facility]- no auth    PT Start Time  1606    PT Stop Time  1646    PT Time Calculation (min)  40 min    Activity Tolerance  Patient tolerated treatment well    Behavior During Therapy  [facility] for tasks assessed/performed       Past Medical History:  Diagnosis Date  . Breast cancer, left ([place]) 12/2017  . Endometriosis   . Family history of breast cancer   . Family history of ovarian cancer   . Family history of pancreatic cancer   . Family history of prostate cancer   . History of chemotherapy    finished chemo 06/18/2018  . Migraines   . PONV (postoperative nausea and vomiting)   . Wears contact lenses     Past Surgical History:  Procedure Laterality Date  . AXILLARY LYMPH NODE DISSECTION Left 07/24/2018  . AXILLARY LYMPH NODE DISSECTION Left 07/24/2018   Procedure: LEFT AXILLARY LYMPH NODE DISSECTION;  Surgeon: Rolm Bookbinder, MD;  Location: [place];  Service: General;  Laterality: Left;  GENERAL WITH PEC BLOCK  . BREAST RECONSTRUCTION WITH PLACEMENT OF TISSUE EXPANDER AND ALLODERM Bilateral 07/02/2018   Procedure: BILATERAL BREAST RECONSTRUCTION WITH PLACEMENT OF TISSUE EXPANDER AND ACELLULAR DERMIS;  Surgeon: Irene Limbo, MD;  Location: [place];  Service: Plastics;  Laterality: Bilateral;  . COLONOSCOPY    . MASTECTOMY WITH RADIOACTIVE SEED GUIDED EXCISION AND AXILLARY SENTINEL LYMPH NODE BIOPSY Bilateral 07/02/2018   Procedure: BILATERAL NIPPLE SPARING MASTECTOMIES WITH LEFT AXILLARY  SENTINEL LYMPH NODE BIOPSY AND LEFT SEED GUIDED NODE EXCISION;  Surgeon: Rolm Bookbinder, MD;  Location: [place];  Service: General;  Laterality: Bilateral;  . NASAL POLYP EXCISION    . OVARIAN CYST REMOVAL    . PORTA CATH REMOVAL Right 07/02/2018   Procedure: PORTA CATH REMOVAL;  Surgeon: Rolm Bookbinder, MD;  Location: [place];  Service: General;  Laterality: Right;  . PORTACATH PLACEMENT Right 01/15/2018   Procedure: INSERTION PORT-A-CATH WITH [place];  Surgeon: Rolm Bookbinder, MD;  Location: [place];  Service: General;  Laterality: Right;  . WISDOM TOOTH EXTRACTION      There were no vitals filed for this visit.  Subjective Assessment - 08/28/18 1611    Subjective  I went back to work full time. I think my arm is getting better but it is still tight.     Pertinent History  bilateral mastectomy on 07/02/18 for left breast cancer, SLNB then ALND on 07/24/18, pt has completed chemotherapy 16 treatments over 5 months, pt will begin radiation soon    Patient Stated Goals  to get arm in position for radiation, strengthening L UE    Currently in Pain?  No/denies    Pain Score  0-No pain         [facility] PT Assessment - 08/28/18 0001      AROM   Left Shoulder Flexion  142 Degrees   158 after PROM  Left Shoulder ABduction  118 Degrees   127 after prom                  [facility] Adult PT Treatment/Exercise - 08/28/18 0001      Shoulder Exercises: Pulleys   Flexion  2 minutes   pt returned therapist demo   ABduction  2 minutes   pt returned therapist demo, cues to keep shoulder relaxed     Shoulder Exercises: Therapy Ball   Flexion  10 reps   with stretch at top, pt returned therapist demo   ABduction  Both;10 reps   pt returned therapist demo     Manual Therapy   Manual Therapy  Passive ROM    Passive ROM  to left shoulder in direction of flexion, abduction, horizontal abduction and ER   also performed with  prolonged holds in direction of flex/abd                 PT Long Term Goals - 08/09/18 1157      PT LONG TERM GOAL #1   Title  Pt to demonstrate 160 degrees of L shoulder flexion to allow her to reach overhead and to allow pt to get in position for radiation    Baseline  95    Time  4    Period  Weeks    Status  New    Target Date  09/06/18      PT LONG TERM GOAL #2   Title  Pt to demonstraste 160 degrees of L shoulder abduction to allow her to reach out to the side and to allow pt to get in position for radiation    Baseline  62    Time  4    Period  Weeks    Status  New    Target Date  09/06/18      PT LONG TERM GOAL #3   Title  Pt to be independent in a home exercise program for continued strengthening and stretching    Time  4    Period  Weeks    Status  New    Target Date  09/06/18      PT LONG TERM GOAL #4   Title  Pt to obtain sleeve and glove for prophylactic use when flying to decrease risk of lymphedema    Time  4    Period  Weeks    Status  New    Target Date  09/06/18      PT LONG TERM GOAL #5   Title  Pt to report a 50% improvement in pain when moving left shoulder to allow improved comfort    Time  4    Period  Weeks    Status  New    Target Date  09/06/18            Plan - 08/28/18 1650    Clinical Impression Statement  Focused today on PROM with prolonged holds to improve ROM. AROM improved following PROM. See measurements. Educated pt to start holding her stretches for longer at home. She has been holding them for 5 sec and educated her to work up to 30 seconds. Pt has radiation simulation soon.     Rehab Potential  Good    Clinical Impairments Affecting Rehab Potential  pt to begin radiation soon    PT Frequency  2x / week    PT Duration  4 weeks    PT Treatment/Interventions  ADLs/Self Care Home Management;Therapeutic  activities;Therapeutic exercise;Manual techniques;Patient/family education;Scar mobilization;Passive range of  motion;Joint Manipulations    PT Next Visit Plan  assess goals, assess indep with supine scap, PROM to left shoulder, myofascial to cording in L armpit    PT Home Exercise Plan  post op breast exercises, supine cane, supine scap    Consulted and Agree with Plan of Care  Patient       Patient will benefit from skilled therapeutic intervention in order to improve the following deficits and impairments:  Pain, Increased fascial restricitons, Decreased scar mobility, Decreased range of motion, Decreased strength, Impaired UE functional use, Decreased knowledge of precautions  Visit Diagnosis: Stiffness of left shoulder, not elsewhere classified  Acute pain of left shoulder     Problem List Patient Active Problem List   Diagnosis Date Noted  . Breast cancer, left ([place]) 07/02/2018  . Chemotherapy induced neutropenia ([place]) 06/06/2018  . Port-A-Cath in place 04/20/2018  . Genetic testing 01/29/2018  . Family history of breast cancer   . Family history of prostate cancer   . Family history of ovarian cancer   . Family history of pancreatic cancer   . Malignant neoplasm of upper-outer quadrant of left breast in female, estrogen receptor positive ([place]) 01/04/2018    Allyson Sabal [facility] 08/28/2018, 4:52 PM  [place], [place], [address] Phone: [phone]   Fax:  [phone]  Name: Kristi [name] MRN: [id number] Date of Birth: August 04, 1975  Manus Gunning, PT 08/28/18 4:52 PM

## 2018-08-29 ENCOUNTER — Encounter: Payer: Self-pay | Admitting: Radiation Oncology

## 2018-08-29 ENCOUNTER — Ambulatory Visit
Admission: RE | Admit: 2018-08-29 | Discharge: 2018-08-29 | Disposition: A | Discharge status: Home / Self Care | Payer: Commercial Managed Care - PPO | Source: Ambulatory Visit | Attending: Radiation Oncology | Admitting: Radiation Oncology

## 2018-08-29 ENCOUNTER — Other Ambulatory Visit: Payer: Self-pay

## 2018-08-29 DIAGNOSIS — Z79899 Other long term (current) drug therapy: Secondary | ICD-10-CM | POA: Insufficient documentation

## 2018-08-29 DIAGNOSIS — Z170 Estrogen receptor positive status [ER+]: Secondary | ICD-10-CM

## 2018-08-29 DIAGNOSIS — C50412 Malignant neoplasm of upper-outer quadrant of left female breast: Secondary | ICD-10-CM

## 2018-08-29 DIAGNOSIS — Z5111 Encounter for antineoplastic chemotherapy: Secondary | ICD-10-CM | POA: Diagnosis not present

## 2018-08-29 DIAGNOSIS — Z510 Encounter for antineoplastic radiation therapy: Secondary | ICD-10-CM | POA: Insufficient documentation

## 2018-08-29 NOTE — Progress Notes (Signed)
Radiation Oncology         [phone] ________________________________  Name: Kristi [name] MRN: [id number]  Date: 08/29/2018  DOB: 1976/01/10  SIMULATION AND TREATMENT PLANNING NOTE // Special treatment procedure    Outpatient  DIAGNOSIS:     ICD-10-CM   1. Malignant neoplasm of upper-outer quadrant of left breast in female, estrogen receptor positive ([place]) C50.412    Z17.0     NARRATIVE:  The patient was brought to the [place].  Identity was confirmed.  All relevant records and images related to the planned course of therapy were reviewed.  The patient freely provided informed written consent to proceed with treatment after reviewing the details related to the planned course of therapy. The consent form was witnessed and verified by the simulation staff.    Then, the patient was set-up in a stable reproducible supine position for radiation therapy with her ipsilateral arm over her head, and her upper body secured in a custom-made Vac-lok device.  CT images were obtained.  Surface markings were placed.  The CT images were loaded into the planning software.    Special treatment procedure:  Special treatment procedure was performed today due to the extra time and effort required by myself to plan and prepare this patient for deep inspiration breath hold technique.  I have determined cardiac sparing to be of benefit to this patient to prevent long term cardiac damage due to radiation of the heart.  Bellows were placed on the patient's abdomen. To facilitate cardiac sparing, the patient was coached by the radiation therapists on breath hold techniques and breathing practice was performed. Practice waveforms were obtained. The patient was then scanned while maintaining breath hold in the treatment position.  This image was then transferred over to the imaging specialist. The imaging specialist then created a fusion of the free breathing and breath hold scans using  the chest wall as the stable structure. I personally reviewed the fusion in axial, coronal and sagittal image planes.  Excellent cardiac sparing was obtained.  I felt the patient is an appropriate candidate for breath hold and the patient will be treated as such.  The image fusion was then reviewed with the patient to reinforce the necessity of reproducible breath hold.   TREATMENT PLANNING NOTE: Treatment planning then occurred.  The radiation prescription was entered and confirmed.     A total of 5 medically necessary complex treatment devices were fabricated and supervised by me: 4 fields with MLCs for custom blocks to protect heart, and lungs;  and, a Vac-lok. MORE COMPLEX DEVICES MAY BE MADE IN DOSIMETRY FOR FIELD IN FIELD BEAMS FOR DOSE HOMOGENEITY.  I have requested : 3D Simulation which is medically necessary to give adequate dose to at risk tissues while sparing lungs and heart.  I have requested a DVH of the following structures: lungs, heart, esophagus, cord.    The patient will receive 50.4 Gy in 28 fractions to the left chest wall and regional nodes with 4 tangential fields.  This will be followed by a boost.  Optical Surface Tracking Plan:  Since intensity modulated radiotherapy (IMRT) and 3D conformal radiation treatment methods are predicated on accurate and precise positioning for treatment, intrafraction motion monitoring is medically necessary to ensure accurate and safe treatment delivery. The ability to quantify intrafraction motion without excessive ionizing radiation dose can only be performed with optical surface tracking. Accordingly, surface imaging offers the opportunity to obtain 3D measurements of patient position throughout IMRT  and 3D treatments without excessive radiation exposure. I am ordering optical surface tracking for this patient's upcoming course of radiotherapy.  ________________________________   Reference:  Ursula Alert, J, et al. Surface  imaging-based analysis of intrafraction motion for breast radiotherapy patients.Journal of [employer], n. 6, nov. 2014. ISSN [id number].  Available at: <http://www.jacmp.org/index.php/jacmp/article/view/4957>.    -----------------------------------  Eppie Gibson, MD

## 2018-08-29 NOTE — Progress Notes (Signed)
Radiation Oncology         ([phone]) [phone] ________________________________  Name: Kristi [name] MRN: [id number]  Date: 08/29/2018  DOB: Nov 17, 1975  Follow-Up Visit Note  Outpatient  CC: Long, Izetta Dakin, MD  Diagnosis:      ICD-10-CM   1. Malignant neoplasm of upper-outer quadrant of left breast in female, estrogen receptor positive ([place]) C50.412    Z17.0   Cancer Staging Malignant neoplasm of upper-outer quadrant of left breast in female, estrogen receptor positive ([place]) Staging form: Breast, AJCC 8th Edition - Clinical: Stage IIB (cT2, cN1, cM0, G3, ER+, PR+, HER2: Equivocal) - Signed by Nicholas Lose, MD on 01/04/2018  CHIEF COMPLAINT: Here to discuss management of left breast cancer  Narrative:  The patient returns today for follow-up. On 07/02/2018 she underwent bilateral mastectomies showing left mastectomy: IDC grade 2, 0.9 cm, margins negative, 1/4 lymph nodes positive with extracapsular extension, margins negative ypT1b ypN1 ER 75% strong, PR 10% strong, HER-2 negative, Ki-67 60% 07/23/2018: Left axillary lymph node dissection: 0/4 lymph nodes.  Symptomatically, the patient reports that her post op pain has significantly improved.  She reports that she is done with tissue expansion in plastic surgery.         Lymphedema issues, if any:  She denies. She is working on arm mobility with PT.   Pain issues, if any: She reports recent "stomach problems" and is seeing a GI MD, Dr. Ardis Hughs.   SAFETY ISSUES:  Prior radiation? No  Pacemaker/ICD? No  Possible current pregnancy? 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   Is the patient on methotrexate? No    ALLERGIES:  is allergic to sulfa antibiotics; chlorhexidine gluconate; and other.  Meds: Current Outpatient Medications  Medication Sig Dispense Refill  . B-2-400 400 MG CAPS Take 400 mg by mouth 2 (two) times daily.    . baclofen (LIORESAL) 10 MG tablet Take by mouth.      Marland Kitchen buPROPion (WELLBUTRIN) 75 MG tablet Take 75 mg by mouth 2 (two) times daily.    . Calcium Carbonate-Vitamin D3 (CALCIUM 600-D) 600-400 MG-UNIT TABS Take 1 tablet by mouth daily.     Marland Kitchen eletriptan (RELPAX) 40 MG tablet Take 40 mg by mouth every 2 (two) hours as needed for migraine (max 2 doses per 24 hrs.).   3  . EMGALITY 120 MG/ML SOAJ Inject 120 mg into the skin every 30 (thirty) days.     . fexofenadine (ALLEGRA) 180 MG tablet Take 180 mg by mouth daily.    Marland Kitchen gabapentin (NEURONTIN) 300 MG capsule Take 1 capsule (300 mg total) by mouth at bedtime. 90 capsule 3  . Goserelin Acetate (ZOLADEX [place]) Inject 1 Dose into the skin every 30 (thirty) days.     Marland Kitchen LORazepam (ATIVAN) 1 MG tablet Take 1 tablet (1 mg total) by mouth 3 (three) times daily as needed (agitation/restlessness). 15 tablet 0  . Magnesium 500 MG CAPS Take 500 mg by mouth 2 (two) times daily.    . Multiple Vitamin (MULTIVITAMIN WITH MINERALS) TABS tablet Take 1 tablet by mouth daily. Women's One A Day Multivitamin    . Multiple Vitamins-Minerals (HAIR SKIN AND NAILS FORMULA PO) Take 1 tablet by mouth daily.     . pantoprazole (PROTONIX) 40 MG tablet Take 1 tablet (40 mg total) by mouth 2 (two) times daily. 60 tablet 0  . Soft Lens Products (REWETTING DROPS) SOLN Place 1 drop into both eyes 3 (three) times daily as needed (contact  lenses.).    Marland Kitchen ondansetron (ZOFRAN) 8 MG tablet Take 8 mg by mouth 2 (two) times daily as needed for nausea or vomiting.     . prochlorperazine (COMPAZINE) 10 MG tablet Take 10 mg by mouth every 6 (six) hours as needed for nausea or vomiting.     No current facility-administered medications for this encounter.    Facility-Administered Medications Ordered in Other Encounters  Medication Dose Route Frequency Provider Last Rate Last Dose  . sodium chloride flush (NS) 0.9 % injection 10 mL  10 mL Intravenous PRN Nicholas Lose, MD   10 mL at 04/13/18 1529    Physical Findings:  height is 5' 5"  (1.651 m) and  weight is 139 lb 2 oz (63.1 kg). Her oral temperature is 97.8 F (36.6 C). Her blood pressure is 136/82 and her pulse is 93. Her respiration is 18 and oxygen saturation is 100%. .     General: Alert and oriented, in no acute distress Extremities: No cyanosis or edema. MSK: decent shoulder ROM Psychiatric: Judgment and insight are intact. Affect is appropriate. Breast exam reveals s/p tissue expansion of chest wall bilaterally.  No sign of recurrence in left chest wall/axilla. Scars have healed nicely.  Lab Findings: Lab Results  Component Value Date   WBC 11.8 (H) 07/31/2018   HGB 13.6 07/31/2018   HCT 41.3 07/31/2018   MCV 88.8 07/31/2018   PLT 457 (H) 07/31/2018    @LASTCHEMISTRY @  Radiographic Findings: US Abdomen Limited Ruq  Result Date: 08/17/2018 CLINICAL DATA:  Upper abdominal pain EXAM: ULTRASOUND ABDOMEN LIMITED RIGHT UPPER QUADRANT COMPARISON:  CT abdomen and pelvis January 12, 2018 FINDINGS: Gallbladder: No gallstones or wall thickening visualized. There is no pericholecystic fluid. No sonographic Murphy sign noted by sonographer. Common bile duct: Diameter: 3 mm.  No intrahepatic biliary duct dilatation. Liver: There is a mildly complex cyst in the right lobe of the liver measuring 1.4 x 1.5 x 1.6 cm. There is a septated cyst in the left lobe of the liver measuring 2.7 x 2.6 x 3.0 cm. A second mildly complex cyst in this area measures 0.8 x 1.0 x 0.8 cm. A more simple appearing cyst in the left lobe measures 1.5 x 0.9 x 1.4 cm. These cysts were present on prior CT. Within normal limits in parenchymal echogenicity. Portal vein is patent on color Doppler imaging with normal direction of blood flow towards the liver. IMPRESSION: Several liver cysts, some of which contain mild septations. These cystic areas appear benign. Study otherwise unremarkable. Electronically Signed   By: Lowella Grip III M.D.   On: 08/17/2018 13:58    Impression/Plan: We discussed adjuvant radiotherapy  today.  I recommend radiotherapy to the left chest wall and regional nodes in order to reduce risk of locoregional recurrence by 2/3 .  The risks, benefits and side effects of this treatment were discussed in detail.  She understands that radiotherapy is associated with skin irritation and fatigue in the acute setting. Late effects can include cosmetic changes and rare injury to internal organs.   She is enthusiastic about proceeding with treatment. A consent form has been signed and placed in her chart.  A total of 5 medically necessary complex treatment devices will be fabricated and supervised by me: 4 fields with MLCs for custom blocks to protect heart, and lungs;  and, a Vac-lok. MORE COMPLEX DEVICES MAY BE MADE IN DOSIMETRY FOR FIELD IN FIELD BEAMS FOR DOSE HOMOGENEITY.  I have requested : 3D Simulation which  is medically necessary to give adequate dose to at risk tissues while sparing lungs and heart.  I have requested a DVH of the following structures: lungs, heart, esophagus, cord.    The patient will receive 50.4 Gy in 28 fractions to the left chest wall and regional nodes with 4 fields.  This will be followed by a boost. Simulation today, start RT next week.  I spent 25 minutes face to face with the patient and more than 50% of that time was spent in counseling and/or coordination of care. _____________________________________   Eppie Gibson, MD

## 2018-08-30 ENCOUNTER — Ambulatory Visit: Payer: Commercial Managed Care - PPO | Admitting: Physical Therapy

## 2018-08-30 ENCOUNTER — Other Ambulatory Visit: Payer: Self-pay

## 2018-08-30 ENCOUNTER — Ambulatory Visit (HOSPITAL_COMMUNITY)
Admission: RE | Admit: 2018-08-30 | Discharge: 2018-08-30 | Disposition: A | Discharge status: Home / Self Care | Payer: Commercial Managed Care - PPO | Source: Ambulatory Visit | Attending: General Surgery | Admitting: General Surgery

## 2018-08-30 ENCOUNTER — Encounter: Payer: Self-pay | Admitting: Physical Therapy

## 2018-08-30 DIAGNOSIS — Z5111 Encounter for antineoplastic chemotherapy: Secondary | ICD-10-CM | POA: Diagnosis not present

## 2018-08-30 DIAGNOSIS — M25612 Stiffness of left shoulder, not elsewhere classified: Secondary | ICD-10-CM

## 2018-08-30 DIAGNOSIS — M25611 Stiffness of right shoulder, not elsewhere classified: Secondary | ICD-10-CM

## 2018-08-30 DIAGNOSIS — M6281 Muscle weakness (generalized): Secondary | ICD-10-CM

## 2018-08-30 DIAGNOSIS — M25512 Pain in left shoulder: Secondary | ICD-10-CM

## 2018-08-30 DIAGNOSIS — R1011 Right upper quadrant pain: Secondary | ICD-10-CM | POA: Diagnosis not present

## 2018-08-30 MED ORDER — TECHNETIUM TC 99M MEBROFENIN IV KIT
5.1000 | PACK | Freq: Once | INTRAVENOUS | Status: AC
  Administered 2018-08-30: 5.1 via INTRAVENOUS

## 2018-08-30 NOTE — Therapy (Signed)
[place], [place], [address] Phone: [phone]   Fax:  [phone]  Physical Therapy Treatment  Patient Details  Name: Kristi [name] MRN: [id number] Date of Birth: 07/08/1976 Referring Provider (PT): Dr. Donne Hazel   Encounter Date: 08/30/2018  PT End of Session - 08/30/18 1632    Visit Number  7    Number of Visits  9    Date for PT Re-Evaluation  09/06/18    Authorization Type  [facility]- no auth    PT Start Time  1543    PT Stop Time  1625    PT Time Calculation (min)  42 min    Activity Tolerance  Patient tolerated treatment well    Behavior During Therapy  [facility] for tasks assessed/performed       Past Medical History:  Diagnosis Date  . Breast cancer, left ([place]) 12/2017  . Endometriosis   . Family history of breast cancer   . Family history of ovarian cancer   . Family history of pancreatic cancer   . Family history of prostate cancer   . History of chemotherapy    finished chemo 06/18/2018  . Migraines   . PONV (postoperative nausea and vomiting)   . Wears contact lenses     Past Surgical History:  Procedure Laterality Date  . AXILLARY LYMPH NODE DISSECTION Left 07/24/2018  . AXILLARY LYMPH NODE DISSECTION Left 07/24/2018   Procedure: LEFT AXILLARY LYMPH NODE DISSECTION;  Surgeon: Rolm Bookbinder, MD;  Location: [place];  Service: General;  Laterality: Left;  GENERAL WITH PEC BLOCK  . BREAST RECONSTRUCTION WITH PLACEMENT OF TISSUE EXPANDER AND ALLODERM Bilateral 07/02/2018   Procedure: BILATERAL BREAST RECONSTRUCTION WITH PLACEMENT OF TISSUE EXPANDER AND ACELLULAR DERMIS;  Surgeon: Irene Limbo, MD;  Location: [place];  Service: Plastics;  Laterality: Bilateral;  . COLONOSCOPY    . MASTECTOMY WITH RADIOACTIVE SEED GUIDED EXCISION AND AXILLARY SENTINEL LYMPH NODE BIOPSY Bilateral 07/02/2018   Procedure: BILATERAL NIPPLE SPARING MASTECTOMIES WITH LEFT AXILLARY  SENTINEL LYMPH NODE BIOPSY AND LEFT SEED GUIDED NODE EXCISION;  Surgeon: Rolm Bookbinder, MD;  Location: [place];  Service: General;  Laterality: Bilateral;  . NASAL POLYP EXCISION    . OVARIAN CYST REMOVAL    . PORTA CATH REMOVAL Right 07/02/2018   Procedure: PORTA CATH REMOVAL;  Surgeon: Rolm Bookbinder, MD;  Location: [place];  Service: General;  Laterality: Right;  . PORTACATH PLACEMENT Right 01/15/2018   Procedure: INSERTION PORT-A-CATH WITH [place];  Surgeon: Rolm Bookbinder, MD;  Location: [place];  Service: General;  Laterality: Right;  . WISDOM TOOTH EXTRACTION      There were no vitals filed for this visit.  Subjective Assessment - 08/30/18 1629    Subjective  I think my shoulder is doing better.    Pertinent History  bilateral mastectomy on 07/02/18 for left breast cancer, SLNB then ALND on 07/24/18, pt has completed chemotherapy 16 treatments over 5 months, pt will begin radiation soon    Patient Stated Goals  to get arm in position for radiation, strengthening L UE    Currently in Pain?  No/denies    Pain Score  0-No pain         [facility] PT Assessment - 08/30/18 0001      AROM   Left Shoulder Flexion  150 Degrees    Left Shoulder ABduction  139 Degrees  [place] Adult PT Treatment/Exercise - 08/30/18 0001      Shoulder Exercises: Standing   Other Standing Exercises  3 way shoulder against wall with 1 lb handweights x 10 reps each       Shoulder Exercises: Pulleys   Flexion  2 minutes   pt returned therapist demo   ABduction  2 minutes   pt returned therapist demo, cues to keep shoulder relaxed     Shoulder Exercises: Therapy Ball   Flexion  10 reps   with stretch at top, pt returned therapist demo   ABduction  Both;10 reps   pt returned therapist demo     Manual Therapy   Manual Therapy  Passive ROM    Passive ROM  to left shoulder in direction of flexion, abduction, horizontal  abduction and ER   also performed with prolonged holds in direction of flex/abd                 PT Long Term Goals - 08/09/18 1157      PT LONG TERM GOAL #1   Title  Pt to demonstrate 160 degrees of L shoulder flexion to allow her to reach overhead and to allow pt to get in position for radiation    Baseline  95    Time  4    Period  Weeks    Status  New    Target Date  09/06/18      PT LONG TERM GOAL #2   Title  Pt to demonstraste 160 degrees of L shoulder abduction to allow her to reach out to the side and to allow pt to get in position for radiation    Baseline  62    Time  4    Period  Weeks    Status  New    Target Date  09/06/18      PT LONG TERM GOAL #3   Title  Pt to be independent in a home exercise program for continued strengthening and stretching    Time  4    Period  Weeks    Status  New    Target Date  09/06/18      PT LONG TERM GOAL #4   Title  Pt to obtain sleeve and glove for prophylactic use when flying to decrease risk of lymphedema    Time  4    Period  Weeks    Status  New    Target Date  09/06/18      PT LONG TERM GOAL #5   Title  Pt to report a 50% improvement in pain when moving left shoulder to allow improved comfort    Time  4    Period  Weeks    Status  New    Target Date  09/06/18            Plan - 08/30/18 1632    Clinical Impression Statement  Remeasured pt's ROM today since pt has started holding her stretches longer and after continued manual therapy and she demonstrates great increases in shoulder flexion and abduction. She will be beginning radiation soon. Educated pt to continue to hold streches longer to continue to improve ROM. Added strengthening exercise today.     Rehab Potential  Good    Clinical Impairments Affecting Rehab Potential  pt to begin radiation soon    PT Frequency  2x / week    PT Duration  4 weeks    PT Treatment/Interventions  ADLs/Self Care Home  Management;Therapeutic activities;Therapeutic  exercise;Manual techniques;Patient/family education;Scar mobilization;Passive range of motion;Joint Manipulations    PT Next Visit Plan  assess goals, assess indep with supine scap, PROM to left shoulder, myofascial to cording in L armpit    PT Home Exercise Plan  post op breast exercises, supine cane, supine scap    Consulted and Agree with Plan of Care  Patient       Patient will benefit from skilled therapeutic intervention in order to improve the following deficits and impairments:  Pain, Increased fascial restricitons, Decreased scar mobility, Decreased range of motion, Decreased strength, Impaired UE functional use, Decreased knowledge of precautions  Visit Diagnosis: Stiffness of left shoulder, not elsewhere classified  Acute pain of left shoulder  Muscle weakness (generalized)  Stiffness of right shoulder, not elsewhere classified     Problem List Patient Active Problem List   Diagnosis Date Noted  . Breast cancer, left ([place]) 07/02/2018  . Chemotherapy induced neutropenia ([place]) 06/06/2018  . Port-A-Cath in place 04/20/2018  . Genetic testing 01/29/2018  . Family history of breast cancer   . Family history of prostate cancer   . Family history of ovarian cancer   . Family history of pancreatic cancer   . Malignant neoplasm of upper-outer quadrant of left breast in female, estrogen receptor positive ([place]) 01/04/2018    Allyson Sabal [facility] 08/30/2018, 4:35 PM  [place], [place], [address] Phone: [phone]   Fax:  [phone]  Name: Kristi [name] MRN: [id number] Date of Birth: [birth year]  Manus Gunning, PT 08/30/18 4:35 PM

## 2018-08-31 ENCOUNTER — Ambulatory Visit: Payer: Commercial Managed Care - PPO | Admitting: Gastroenterology

## 2018-08-31 ENCOUNTER — Encounter: Payer: Self-pay | Admitting: Gastroenterology

## 2018-08-31 VITALS — BP 104/72 | HR 68 | Ht 65.0 in | Wt 138.2 lb

## 2018-08-31 DIAGNOSIS — R1013 Epigastric pain: Secondary | ICD-10-CM

## 2018-08-31 DIAGNOSIS — R634 Abnormal weight loss: Secondary | ICD-10-CM

## 2018-08-31 DIAGNOSIS — R110 Nausea: Secondary | ICD-10-CM

## 2018-08-31 NOTE — Patient Instructions (Addendum)
Decrease your protonix to once daily. EGD on Monday for epigastric pain, weight loss, nausea.  You have been scheduled for an endoscopy. Please follow written instructions given to you at your visit today. If you use inhalers (even only as needed), please bring them with you on the day of your procedure. Your physician has requested that you go to www.startemmi.com and enter the access code given to you at your visit today. This web site gives a general overview about your procedure. However, you should still follow specific instructions given to you by our office regarding your preparation for the procedure.  Thank you for entrusting me with your care and choosing [facility].  Dr Ardis Hughs

## 2018-08-31 NOTE — Progress Notes (Signed)
HPI: This is a very pleasant 43 year old woman who was referred by Dr. Serita Grammes for epigastric pain  Chief complaint is epigastric, right upper quadrant pain  She was diagnosed with breast cancer this past summer.  She underwent chemotherapy for several months and then double mastectomy December 2019.  She had positive margins and required a second surgery also in December 2019.  About a month ago, after her surgeries she began to have significant abdominal pain.:  These are postprandial, epigastric, stabbing can last an hour or several hours.  They were noticeable only with fatty foods at first but now it is with any foods.  For the last 2 weeks or so she has been on a liquid diet.  She is losing weight.  She thinks she has lost about 14 pounds in the past month.  She has some mild associated nausea with the pains  Never real diarrhea or constipated.  She never takes NSAIDs.  She started proton pump inhibitor twice daily about 3 weeks ago and that has not helped at all.  Grandmother had pancreatic troubles, eventually whipple and she is still alive.   Old Data Reviewed: Blood work January 2020 shows normal CBC except for slightly elevated white blood cell count, normal complete metabolic profile, lipase 57 which is just slightly above upper limit of normal  January 2020 abdominal ultrasound for upper abdominal pain shows several liver cysts which "appear benign" to the radiologist gallbladder was normal.  No gallstones. HIDA scan February 2020 was "within normal limits"    Review of systems: Pertinent positive and negative review of systems were noted in the above HPI section. All other review negative.   Past Medical History:  Diagnosis Date  . Breast cancer, left ([place]) 12/2017  . Endometriosis   . Family history of breast cancer   . Family history of ovarian cancer   . Family history of pancreatic cancer   . Family history of prostate cancer   . History of chemotherapy     finished chemo 06/18/2018  . Migraines   . PONV (postoperative nausea and vomiting)   . Wears contact lenses     Past Surgical History:  Procedure Laterality Date  . AXILLARY LYMPH NODE DISSECTION Left 07/24/2018  . AXILLARY LYMPH NODE DISSECTION Left 07/24/2018   Procedure: LEFT AXILLARY LYMPH NODE DISSECTION;  Surgeon: Rolm Bookbinder, MD;  Location: [place];  Service: General;  Laterality: Left;  GENERAL WITH PEC BLOCK  . BREAST RECONSTRUCTION WITH PLACEMENT OF TISSUE EXPANDER AND ALLODERM Bilateral 07/02/2018   Procedure: BILATERAL BREAST RECONSTRUCTION WITH PLACEMENT OF TISSUE EXPANDER AND ACELLULAR DERMIS;  Surgeon: Irene Limbo, MD;  Location: [place];  Service: Plastics;  Laterality: Bilateral;  . COLONOSCOPY    . MASTECTOMY WITH RADIOACTIVE SEED GUIDED EXCISION AND AXILLARY SENTINEL LYMPH NODE BIOPSY Bilateral 07/02/2018   Procedure: BILATERAL NIPPLE SPARING MASTECTOMIES WITH LEFT AXILLARY SENTINEL LYMPH NODE BIOPSY AND LEFT SEED GUIDED NODE EXCISION;  Surgeon: Rolm Bookbinder, MD;  Location: [place];  Service: General;  Laterality: Bilateral;  . NASAL POLYP EXCISION    . OVARIAN CYST REMOVAL    . PORTA CATH REMOVAL Right 07/02/2018   Procedure: PORTA CATH REMOVAL;  Surgeon: Rolm Bookbinder, MD;  Location: [place];  Service: General;  Laterality: Right;  . PORTACATH PLACEMENT Right 01/15/2018   Procedure: INSERTION PORT-A-CATH WITH [place];  Surgeon: Rolm Bookbinder, MD;  Location: [place];  Service: General;  Laterality: Right;  . WISDOM  TOOTH EXTRACTION      Current Outpatient Medications  Medication Sig Dispense Refill  . baclofen (LIORESAL) 10 MG tablet Take by mouth.    . eletriptan (RELPAX) 40 MG tablet Take 40 mg by mouth every 2 (two) hours as needed for migraine (max 2 doses per 24 hrs.).   3  . EMGALITY 120 MG/ML SOAJ Inject 120 mg into the skin every 30 (thirty) days.     . fexofenadine  (ALLEGRA) 180 MG tablet Take 180 mg by mouth daily.    Marland Kitchen gabapentin (NEURONTIN) 300 MG capsule Take 1 capsule (300 mg total) by mouth at bedtime. 90 capsule 3  . Goserelin Acetate (ZOLADEX [place]) Inject 1 Dose into the skin every 30 (thirty) days.     Marland Kitchen LORazepam (ATIVAN) 1 MG tablet Take 1 tablet (1 mg total) by mouth 3 (three) times daily as needed (agitation/restlessness). 15 tablet 0  . pantoprazole (PROTONIX) 40 MG tablet Take 1 tablet (40 mg total) by mouth 2 (two) times daily. 60 tablet 0  . Soft Lens Products (REWETTING DROPS) SOLN Place 1 drop into both eyes 3 (three) times daily as needed (contact lenses.).    Marland Kitchen buPROPion (WELLBUTRIN) 75 MG tablet Take 75 mg by mouth 2 (two) times daily.     No current facility-administered medications for this visit.    Facility-Administered Medications Ordered in Other Visits  Medication Dose Route Frequency Provider Last Rate Last Dose  . sodium chloride flush (NS) 0.9 % injection 10 mL  10 mL Intravenous PRN Nicholas Lose, MD   10 mL at 04/13/18 1529    Allergies as of 08/31/2018 - Review Complete 08/31/2018  Allergen Reaction Noted  . Sulfa antibiotics Rash and Other (See Comments) 01/08/2018  . Chlorhexidine gluconate Other (See Comments) 07/24/2018  . Other Itching and Rash 07/23/2018    Family History  Problem Relation Age of Onset  . Prostate cancer Father 71       Stage 1  . Ovarian cancer Maternal Aunt 19       ? Germ cell?  . Prostate cancer Maternal Uncle 65       prostectomy  . Pancreatic cancer Maternal Grandmother 23  . Colon cancer Maternal Grandfather 75  . Heart attack Paternal Grandmother   . Prostate cancer Paternal Grandfather        dx in his 75s, d. 70s-90s  . Breast cancer Other        MGMs sister dx over 86    Social History   Socioeconomic History  . Marital status: Single    Spouse name: Not on file  . Number of children: Not on file  . Years of education: Not on file  . Highest education level: Not on  file  Occupational History  . Not on file  Social Needs  . Financial resource strain: Not on file  . Food insecurity:    Worry: Not on file    Inability: Not on file  . Transportation needs:    Medical: No    Non-medical: No  Tobacco Use  . Smoking status: Never Smoker  . Smokeless tobacco: Never Used  Substance and Sexual Activity  . Alcohol use: Yes    Comment: rare  . Drug use: Never  . Sexual activity: Not on file  Lifestyle  . Physical activity:    Days per week: Not on file    Minutes per session: Not on file  . Stress: Not on file  Relationships  . Social  connections:    Talks on phone: Not on file    Gets together: Not on file    Attends religious service: Not on file    Active member of club or organization: Not on file    Attends meetings of clubs or organizations: Not on file    Relationship status: Not on file  . Intimate partner violence:    Fear of current or ex partner: No    Emotionally abused: No    Physically abused: No    Forced sexual activity: No  Other Topics Concern  . Not on file  Social History Narrative  . Not on file     Physical Exam: BP 104/72   Pulse 68   Ht 5\' 5"  (1.651 m)   Wt 138 lb 4 oz (62.7 kg)   BMI 23.01 kg/m  Constitutional: generally well-appearing Psychiatric: alert and oriented x3 Eyes: extraocular movements intact Mouth: oral pharynx moist, no lesions Neck: supple no lymphadenopathy Cardiovascular: heart regular rate and rhythm Lungs: clear to auscultation bilaterally Abdomen: soft, mildly tender epigastrium and right upper quadrant,  nondistended, no obvious ascites, no peritoneal signs, normal bowel sounds Extremities: no lower extremity edema bilaterally Skin: no lesions on visible extremities   Assessment and plan: 43 y.o. female with 1 month of epigastric, right upper quadrant postprandial pains  Normal CBC, normal complete metabolic profile, gallbladder looks normal by ultrasound and HIDA scan.  She  does have soup several cysts in her liver but these have not changed in many months and I doubt are causing her pain.  Unclear etiology, certainly deserves further testing.  I recommended starting with an EGD and I would like to do that for her on Monday.  Possibly this is significant gastritis, H. pylori, gastric ulcer.  I recommended she cut back her proton pump inhibitor from twice daily to once daily since starting it has not really made any difference at all.  Was a good idea however.  If EGD does not show an obvious explanation for her significant abdominal pains and weight loss and I would likely pursue further imaging, probably cross-sectional with CT scan.    Please see the "Patient Instructions" section for addition details about the plan.   Owens Loffler, MD [facility] 08/31/2018, 2:41 PM  Cc: Serita Grammes, MD

## 2018-09-03 ENCOUNTER — Ambulatory Visit: Payer: Commercial Managed Care - PPO | Admitting: Physical Therapy

## 2018-09-03 ENCOUNTER — Ambulatory Visit (AMBULATORY_SURGERY_CENTER): Payer: Commercial Managed Care - PPO | Admitting: Gastroenterology

## 2018-09-03 ENCOUNTER — Encounter: Payer: Self-pay | Admitting: Gastroenterology

## 2018-09-03 VITALS — BP 127/78 | HR 92 | Temp 97.8°F | Resp 17 | Ht 65.0 in | Wt 138.0 lb

## 2018-09-03 DIAGNOSIS — K2970 Gastritis, unspecified, without bleeding: Secondary | ICD-10-CM | POA: Diagnosis not present

## 2018-09-03 DIAGNOSIS — R110 Nausea: Secondary | ICD-10-CM

## 2018-09-03 DIAGNOSIS — R1013 Epigastric pain: Secondary | ICD-10-CM

## 2018-09-03 DIAGNOSIS — K2990 Gastroduodenitis, unspecified, without bleeding: Secondary | ICD-10-CM

## 2018-09-03 DIAGNOSIS — L239 Allergic contact dermatitis, unspecified cause: Secondary | ICD-10-CM

## 2018-09-03 DIAGNOSIS — K2950 Unspecified chronic gastritis without bleeding: Secondary | ICD-10-CM | POA: Diagnosis not present

## 2018-09-03 MED ORDER — SODIUM CHLORIDE 0.9 % IV SOLN: 500.0000 mL | Freq: Once | INTRAVENOUS | Status: DC

## 2018-09-03 MED ORDER — DIPHENHYDRAMINE HCL 50 MG/ML IJ SOLN
25.0000 mg | Freq: Once | INTRAMUSCULAR | Status: AC
  Administered 2018-09-03: 25 mg via INTRAMUSCULAR

## 2018-09-03 NOTE — Patient Instructions (Signed)
Handout given for Gastritis.  YOU HAD AN ENDOSCOPIC PROCEDURE TODAY AT [facility]:   Refer to the procedure report that was given to you for any specific questions about what was found during the examination.  If the procedure report does not answer your questions, please call your gastroenterologist to clarify.  If you requested that your care partner not be given the details of your procedure findings, then the procedure report has been included in a sealed envelope for you to review at your convenience later.  YOU SHOULD EXPECT: Some feelings of bloating in the abdomen. Passage of more gas than usual.  Walking can help get rid of the air that was put into your GI tract during the procedure and reduce the bloating. If you had a lower endoscopy (such as a colonoscopy or flexible sigmoidoscopy) you may notice spotting of blood in your stool or on the toilet paper. If you underwent a bowel prep for your procedure, you may not have a normal bowel movement for a few days.  Please Note:  You might notice some irritation and congestion in your nose or some drainage.  This is from the oxygen used during your procedure.  There is no need for concern and it should clear up in a day or so.  SYMPTOMS TO REPORT IMMEDIATELY:    Following upper endoscopy (EGD)  Vomiting of blood or coffee ground material  New chest pain or pain under the shoulder blades  Painful or persistently difficult swallowing  New shortness of breath  Fever of 100F or higher  Black, tarry-looking stools  For urgent or emergent issues, a gastroenterologist can be reached at any hour by calling [phone].   DIET:  We do recommend a small meal at first, but then you may proceed to your regular diet.  Drink plenty of fluids but you should avoid alcoholic beverages for 24 hours.  ACTIVITY:  You should plan to take it easy for the rest of today and you should NOT DRIVE or use heavy machinery until tomorrow  (because of the sedation medicines used during the test).    FOLLOW UP: Our staff will call the number listed on your records the next business day following your procedure to check on you and address any questions or concerns that you may have regarding the information given to you following your procedure. If we do not reach you, we will leave a message.  However, if you are feeling well and you are not experiencing any problems, there is no need to return our call.  We will assume that you have returned to your regular daily activities without incident.  If any biopsies were taken you will be contacted by phone or by letter within the next 1-3 weeks.  Please call us at [phone] if you have not heard about the biopsies in 3 weeks.    SIGNATURES/CONFIDENTIALITY: You and/or your care partner have signed paperwork which will be entered into your electronic medical record.  These signatures attest to the fact that that the information above on your After Visit Summary has been reviewed and is understood.  Full responsibility of the confidentiality of this discharge information lies with you and/or your care-partner.

## 2018-09-03 NOTE — Progress Notes (Signed)
Called to room to assist during endoscopic procedure.  Patient ID and intended procedure confirmed with present staff. Received instructions for my participation in the procedure from the performing physician.  

## 2018-09-03 NOTE — Progress Notes (Signed)
Upon removing EKG leads from pt's chest, reddened circles noted where leads were on chest. Reddened "rash" type reaction noted extending away from circular areas as well.  Advised Dr. Ardis Hughs and benadryl IM 25 mg ordered.  Medication given and pt advised to keep an eye on areas, apply cool compresses and be aware of possible sleepiness from benadryl.

## 2018-09-03 NOTE — Addendum Note (Signed)
Addended by: Darron Doom on: 09/03/2018 01:05 PM   Modules accepted: Orders

## 2018-09-03 NOTE — Progress Notes (Signed)
Report given to PACU, vss 

## 2018-09-03 NOTE — Op Note (Signed)
[facility] Patient Name: Kristi [name] Procedure Date: 09/03/2018 9:40 AM MRN: [id number] Endoscopist: Milus Banister , MD Age: 43 Referring MD:  Date of Birth: 1975-12-27 Gender: Female Account #: [id number] Procedure:                Upper GI endoscopy Indications:              Epigastric abdominal pain, Abdominal pain in the                            right upper quadrant, Nausea Medicines:                Monitored Anesthesia Care Procedure:                Pre-Anesthesia Assessment:                           - Prior to the procedure, a History and Physical                            was performed, and patient medications and                            allergies were reviewed. The patient's tolerance of                            previous anesthesia was also reviewed. The risks                            and benefits of the procedure and the sedation                            options and risks were discussed with the patient.                            All questions were answered, and informed consent                            was obtained. Prior Anticoagulants: The patient has                            taken no previous anticoagulant or antiplatelet                            agents. ASA Grade Assessment: II - A patient with                            mild systemic disease. After reviewing the risks                            and benefits, the patient was deemed in                            satisfactory condition to undergo the procedure.  After obtaining informed consent, the endoscope was                            passed under direct vision. Throughout the                            procedure, the patient's blood pressure, pulse, and                            oxygen saturations were monitored continuously. The                            Endoscope was introduced through the mouth, and                            advanced to the second  part of duodenum. The upper                            GI endoscopy was accomplished without difficulty.                            The patient tolerated the procedure well. Scope In: Scope Out: Findings:                 Mild inflammation characterized by erythema,                            friability and granularity was found in the gastric                            antrum. Biopsies were taken with a cold forceps for                            histology.                           The exam was otherwise without abnormality.                           Biopsies for histology were taken with a cold                            forceps in the duodenum for evaluation of celiac                            disease. Complications:            No immediate complications. Estimated blood loss:                            None. Estimated Blood Loss:     Estimated blood loss: none. Impression:               - Non-specific distal gastritis, biopsied to check  for H. pylori.                           - The examination was otherwise normal.                           - Biopsies from the normal appearing duodenum were                            taken with a cold forceps for evaluation of celiac                            disease. Recommendation:           - Patient has a contact number available for                            emergencies. The signs and symptoms of potential                            delayed complications were discussed with the                            patient. Return to normal activities tomorrow.                            Written discharge instructions were provided to the                            patient.                           - Resume previous diet.                           - Continue present medications.                           - Await pathology results. Milus Banister, MD 09/03/2018 9:54:12 AM This report has been signed electronically.

## 2018-09-04 ENCOUNTER — Telehealth: Payer: Self-pay

## 2018-09-04 ENCOUNTER — Telehealth: Payer: Self-pay | Admitting: Hematology and Oncology

## 2018-09-04 DIAGNOSIS — Z5111 Encounter for antineoplastic chemotherapy: Secondary | ICD-10-CM | POA: Diagnosis not present

## 2018-09-04 NOTE — Telephone Encounter (Signed)
  Follow up Call-  Call back number 09/03/2018  Post procedure Call Back phone  # [phone]  Permission to leave phone message Yes     Patient questions:  Do you have a fever, pain , or abdominal swelling? No. Pain Score  0 *  Have you tolerated food without any problems? Yes.    Have you been able to return to your normal activities? Yes.    Do you have any questions about your discharge instructions: Diet   No. Medications  No. Follow up visit  No.  Do you have questions or concerns about your Care? No.  Actions: * If pain score is 4 or above: No action needed, pain <4.

## 2018-09-04 NOTE — Telephone Encounter (Signed)
Scheduled appt per 2/7 sch message - unable to reach patient - left message with appt date and time and sent reminder letter in the mail .

## 2018-09-05 ENCOUNTER — Ambulatory Visit: Payer: Commercial Managed Care - PPO | Admitting: Radiation Oncology

## 2018-09-06 ENCOUNTER — Ambulatory Visit: Payer: Commercial Managed Care - PPO | Admitting: Physical Therapy

## 2018-09-06 ENCOUNTER — Ambulatory Visit
Admission: RE | Admit: 2018-09-06 | Discharge: 2018-09-06 | Disposition: A | Discharge status: Home / Self Care | Payer: Commercial Managed Care - PPO | Source: Ambulatory Visit | Attending: Radiation Oncology | Admitting: Radiation Oncology

## 2018-09-06 ENCOUNTER — Encounter: Payer: Self-pay | Admitting: Physical Therapy

## 2018-09-06 DIAGNOSIS — M25512 Pain in left shoulder: Secondary | ICD-10-CM

## 2018-09-06 DIAGNOSIS — M6281 Muscle weakness (generalized): Secondary | ICD-10-CM

## 2018-09-06 DIAGNOSIS — M25612 Stiffness of left shoulder, not elsewhere classified: Secondary | ICD-10-CM

## 2018-09-06 DIAGNOSIS — M25611 Stiffness of right shoulder, not elsewhere classified: Secondary | ICD-10-CM

## 2018-09-06 DIAGNOSIS — Z483 Aftercare following surgery for neoplasm: Secondary | ICD-10-CM

## 2018-09-06 DIAGNOSIS — Z5111 Encounter for antineoplastic chemotherapy: Secondary | ICD-10-CM | POA: Diagnosis not present

## 2018-09-06 NOTE — Patient Instructions (Signed)
Access Code: [email or web address]  URL: [email or web address]  Date: 09/06/2018  Prepared by: Manus Gunning   Exercises  Prone Scapular Retraction Y - 10 reps - 1 sets - 5 sec hold - 1x daily - 7x weekly  Prone W Scapular Retraction - 10 reps - 1 sets - 5 sec hold - 1x daily - 7x weekly  Superman on Table - 10 reps - 1 sets - 5 sec hold - 1x daily - 7x weekly  Prone Scapular Slide with Shoulder Extension - 10 reps - 1 sets - 5 sec hold - 1x daily - 7x weekly

## 2018-09-06 NOTE — Therapy (Signed)
[place], [place], [address] Phone: [phone]   Fax:  [phone]  Physical Therapy Treatment  Patient Details  Name: Kristi [name] MRN: [id number] Date of Birth: 27-Dec-1975 Referring Provider (PT): Dr. Donne Hazel   Encounter Date: 09/06/2018  PT End of Session - 09/06/18 1706    Visit Number  8    Number of Visits  13    Date for PT Re-Evaluation  10/04/18    Authorization Type  [facility]- no auth    PT Start Time  1515    PT Stop Time  1602    PT Time Calculation (min)  47 min    Activity Tolerance  Patient tolerated treatment well    Behavior During Therapy  [facility] for tasks assessed/performed       Past Medical History:  Diagnosis Date  . Breast cancer, left ([place]) 12/2017  . Endometriosis   . Family history of breast cancer   . Family history of ovarian cancer   . Family history of pancreatic cancer   . Family history of prostate cancer   . History of chemotherapy    finished chemo 06/18/2018  . Migraines   . PONV (postoperative nausea and vomiting)   . Wears contact lenses     Past Surgical History:  Procedure Laterality Date  . AXILLARY LYMPH NODE DISSECTION Left 07/24/2018  . AXILLARY LYMPH NODE DISSECTION Left 07/24/2018   Procedure: LEFT AXILLARY LYMPH NODE DISSECTION;  Surgeon: Rolm Bookbinder, MD;  Location: [facility];  Service: General;  Laterality: Left;  GENERAL WITH PEC BLOCK  . BREAST RECONSTRUCTION WITH PLACEMENT OF TISSUE EXPANDER AND ALLODERM Bilateral 07/02/2018   Procedure: BILATERAL BREAST RECONSTRUCTION WITH PLACEMENT OF TISSUE EXPANDER AND ACELLULAR DERMIS;  Surgeon: Irene Limbo, MD;  Location: [facility];  Service: Plastics;  Laterality: Bilateral;  . COLONOSCOPY    . MASTECTOMY WITH RADIOACTIVE SEED GUIDED EXCISION AND AXILLARY SENTINEL LYMPH NODE BIOPSY Bilateral 07/02/2018   Procedure: BILATERAL NIPPLE SPARING MASTECTOMIES WITH LEFT  AXILLARY SENTINEL LYMPH NODE BIOPSY AND LEFT SEED GUIDED NODE EXCISION;  Surgeon: Rolm Bookbinder, MD;  Location: [facility];  Service: General;  Laterality: Bilateral;  . NASAL POLYP EXCISION    . OVARIAN CYST REMOVAL    . PORTA CATH REMOVAL Right 07/02/2018   Procedure: PORTA CATH REMOVAL;  Surgeon: Rolm Bookbinder, MD;  Location: [facility];  Service: General;  Laterality: Right;  . PORTACATH PLACEMENT Right 01/15/2018   Procedure: INSERTION PORT-A-CATH WITH [place];  Surgeon: Rolm Bookbinder, MD;  Location: [facility];  Service: General;  Laterality: Right;  . WISDOM TOOTH EXTRACTION      There were no vitals filed for this visit.  Subjective Assessment - 09/06/18 1517    Subjective  Last night my shoulder started to hurt really bad. I could not sleep on that side.     Pertinent History  bilateral mastectomy on 07/02/18 for left breast cancer, SLNB then ALND on 07/24/18, pt has completed chemotherapy 16 treatments over 5 months, pt will begin radiation soon    Patient Stated Goals  to get arm in position for radiation, strengthening L UE    Currently in Pain?  No/denies    Pain Score  0-No pain         [facility] PT Assessment - 09/06/18 0001      AROM   Left Shoulder Flexion  160 Degrees    Left Shoulder ABduction  140 Degrees  [facility] Adult PT Treatment/Exercise - 09/06/18 0001      Shoulder Exercises: Prone   Other Prone Exercises  Instructed pt in the following and issued an HEP with the following: prone scapular retraction "Y" with 3 sec holds x 10, prone scapular retraction "W" with 3 sec holds x 10 reps, superman with neck retraction with 5 sec holds x 3 reps, scapular slide with shoulder extension x 10 reps      Manual Therapy   Passive ROM  to left shoulder with focus on abduction with prolonged holds while lying over purple ball between shoulder blades for pec stretch                   PT Long Term Goals - 09/06/18 1519      PT LONG TERM GOAL #1   Title  Pt to demonstrate 160 degrees of L shoulder flexion to allow her to reach overhead and to allow pt to get in position for radiation    Baseline  95, 09/06/18- 160    Time  4    Period  Weeks    Status  Achieved      PT LONG TERM GOAL #2   Title  Pt to demonstraste 160 degrees of L shoulder abduction to allow her to reach out to the side and to allow pt to get in position for radiation    Baseline  62, 09/06/18- 140    Time  4    Period  Weeks    Status  On-going      PT LONG TERM GOAL #3   Title  Pt to be independent in a home exercise program for continued strengthening and stretching    Time  4    Period  Weeks    Status  On-going      PT LONG TERM GOAL #4   Title  Pt to obtain sleeve and glove for prophylactic use when flying to decrease risk of lymphedema    Baseline  09/06/18- it has been ordered    Time  4    Period  Weeks    Status  On-going      PT LONG TERM GOAL #5   Title  Pt to report a 50% improvement in pain when moving left shoulder to allow improved comfort    Baseline  09/06/18- 75%     Time  4    Period  Weeks    Status  Achieved            Plan - 09/06/18 1706    Clinical Impression Statement  Pt has been having deep pain in left shoulder especially at night which is making it difficult for her to lie on her side. Focused today on posture exercises and PROM to help decrease this pain and decrease anterior placement of shoulder. Issued new exercises for pt to do at home. She would benefit from additional skilled PT services to decrease pain in left shoulder and increase left shoulder ROM.     Rehab Potential  Good    Clinical Impairments Affecting Rehab Potential  pt to begin radiation soon    PT Frequency  1x / week    PT Duration  4 weeks    PT Treatment/Interventions  ADLs/Self Care Home Management;Therapeutic activities;Therapeutic exercise;Manual  techniques;Patient/family education;Scar mobilization;Passive range of motion;Joint Manipulations    PT Next Visit Plan  assess goals, assess indep with new exercises given this session, PROM to left shoulder, myofascial to cording  in L armpit    PT Home Exercise Plan  post op breast exercises, supine cane, supine scap, Access Code: [email or web address]     Consulted and Agree with Plan of Care  Patient       Patient will benefit from skilled therapeutic intervention in order to improve the following deficits and impairments:  Pain, Increased fascial restricitons, Decreased scar mobility, Decreased range of motion, Decreased strength, Impaired UE functional use, Decreased knowledge of precautions  Visit Diagnosis: Stiffness of left shoulder, not elsewhere classified  Acute pain of left shoulder  Muscle weakness (generalized)  Stiffness of right shoulder, not elsewhere classified  Aftercare following surgery for neoplasm     Problem List Patient Active Problem List   Diagnosis Date Noted  . Breast cancer, left ([place]) 07/02/2018  . Chemotherapy induced neutropenia ([place]) 06/06/2018  . Port-A-Cath in place 04/20/2018  . Genetic testing 01/29/2018  . Family history of breast cancer   . Family history of prostate cancer   . Family history of ovarian cancer   . Family history of pancreatic cancer   . Malignant neoplasm of upper-outer quadrant of left breast in female, estrogen receptor positive ([place]) 01/04/2018    Allyson Sabal [facility] 09/06/2018, 5:09 PM  [place], [place], [address] Phone: [phone]   Fax:  [phone]  Name: Deniss Wormley MRN: [id number] Date of Birth: [birth year]  Manus Gunning, PT 09/06/18 5:09 PM

## 2018-09-07 ENCOUNTER — Inpatient Hospital Stay: Payer: Commercial Managed Care - PPO | Attending: Hematology and Oncology

## 2018-09-07 ENCOUNTER — Ambulatory Visit
Admission: RE | Admit: 2018-09-07 | Discharge: 2018-09-07 | Disposition: A | Discharge status: Home / Self Care | Payer: Commercial Managed Care - PPO | Source: Ambulatory Visit | Attending: Radiation Oncology | Admitting: Radiation Oncology

## 2018-09-07 ENCOUNTER — Other Ambulatory Visit: Payer: Self-pay

## 2018-09-07 DIAGNOSIS — Z5111 Encounter for antineoplastic chemotherapy: Secondary | ICD-10-CM | POA: Diagnosis not present

## 2018-09-07 DIAGNOSIS — R1013 Epigastric pain: Secondary | ICD-10-CM

## 2018-09-07 DIAGNOSIS — C773 Secondary and unspecified malignant neoplasm of axilla and upper limb lymph nodes: Secondary | ICD-10-CM | POA: Insufficient documentation

## 2018-09-07 DIAGNOSIS — R110 Nausea: Secondary | ICD-10-CM

## 2018-09-07 DIAGNOSIS — C50412 Malignant neoplasm of upper-outer quadrant of left female breast: Secondary | ICD-10-CM | POA: Insufficient documentation

## 2018-09-07 DIAGNOSIS — R634 Abnormal weight loss: Secondary | ICD-10-CM

## 2018-09-07 DIAGNOSIS — Z170 Estrogen receptor positive status [ER+]: Secondary | ICD-10-CM

## 2018-09-07 MED ORDER — GOSERELIN ACETATE 3.6 MG ~~LOC~~ IMPL
3.6000 mg | DRUG_IMPLANT | Freq: Once | SUBCUTANEOUS | Status: AC
  Administered 2018-09-07: 3.6 mg via SUBCUTANEOUS

## 2018-09-07 MED ORDER — GOSERELIN ACETATE 3.6 MG ~~LOC~~ IMPL
DRUG_IMPLANT | SUBCUTANEOUS | Status: AC
  Filled 2018-09-07: qty 3.6

## 2018-09-07 NOTE — Patient Instructions (Signed)
Goserelin injection What is this medicine? GOSERELIN (GOE se rel in) is similar to a hormone found in the body. It lowers the amount of sex hormones that the body makes. Men will have lower testosterone levels and women will have lower estrogen levels while taking this medicine. In men, this medicine is used to treat prostate cancer; the injection is either given once per month or once every 12 weeks. A once per month injection (only) is used to treat women with endometriosis, dysfunctional uterine bleeding, or advanced breast cancer. This medicine may be used for other purposes; ask your health care provider or pharmacist if you have questions. COMMON BRAND NAME(S): Zoladex What should I tell my health care provider before I take this medicine? They need to know if you have any of these conditions (some only apply to women): -diabetes -heart disease or previous heart attack -high blood pressure -high cholesterol -kidney disease -osteoporosis or low bone density -problems passing urine -spinal cord injury -stroke -tobacco smoker -an unusual or allergic reaction to goserelin, hormone therapy, other medicines, foods, dyes, or preservatives -pregnant or trying to get pregnant -breast-feeding How should I use this medicine? This medicine is for injection under the skin. It is given by a health care professional in a hospital or clinic setting. Men receive this injection once every 4 weeks or once every 12 weeks. Women will only receive the once every 4 weeks injection. Talk to your pediatrician regarding the use of this medicine in children. Special care may be needed. Overdosage: If you think you have taken too much of this medicine contact a poison control center or emergency room at once. NOTE: This medicine is only for you. Do not share this medicine with others. What if I miss a dose? It is important not to miss your dose. Call your doctor or health care professional if you are unable to  keep an appointment. What may interact with this medicine? -female hormones like estrogen -herbal or dietary supplements like black cohosh, chasteberry, or DHEA -female hormones like testosterone -prasterone This list may not describe all possible interactions. Give your health care provider a list of all the medicines, herbs, non-prescription drugs, or dietary supplements you use. Also tell them if you smoke, drink alcohol, or use illegal drugs. Some items may interact with your medicine. What should I watch for while using this medicine? Visit your doctor or health care professional for regular checks on your progress. Your symptoms may appear to get worse during the first weeks of this therapy. Tell your doctor or healthcare professional if your symptoms do not start to get better or if they get worse after this time. Your bones may get weaker if you take this medicine for a long time. If you smoke or frequently drink alcohol you may increase your risk of bone loss. A family history of osteoporosis, chronic use of drugs for seizures (convulsions), or corticosteroids can also increase your risk of bone loss. Talk to your doctor about how to keep your bones strong. This medicine should stop regular monthly menstration in women. Tell your doctor if you continue to menstrate. Women should not become pregnant while taking this medicine or for 12 weeks after stopping this medicine. Women should inform their doctor if they wish to become pregnant or think they might be pregnant. There is a potential for serious side effects to an unborn child. Talk to your health care professional or pharmacist for more information. Do not breast-feed an infant while taking   this medicine. Men should inform their doctors if they wish to father a child. This medicine may lower sperm counts. Talk to your health care professional or pharmacist for more information. What side effects may I notice from receiving this  medicine? Side effects that you should report to your doctor or health care professional as soon as possible: -allergic reactions like skin rash, itching or hives, swelling of the face, lips, or tongue -bone pain -breathing problems -changes in vision -chest pain -feeling faint or lightheaded, falls -fever, chills -pain, swelling, warmth in the leg -pain, tingling, numbness in the hands or feet -signs and symptoms of low blood pressure like dizziness; feeling faint or lightheaded, falls; unusually weak or tired -stomach pain -swelling of the ankles, feet, hands -trouble passing urine or change in the amount of urine -unusually high or low blood pressure -unusually weak or tired Side effects that usually do not require medical attention (report to your doctor or health care professional if they continue or are bothersome): -change in sex drive or performance -changes in breast size in both males and females -changes in emotions or moods -headache -hot flashes -irritation at site where injected -loss of appetite -skin problems like acne, dry skin -vaginal dryness This list may not describe all possible side effects. Call your doctor for medical advice about side effects. You may report side effects to FDA at 1-800-FDA-1088. Where should I keep my medicine? This drug is given in a hospital or clinic and will not be stored at home. NOTE: This sheet is a summary. It may not cover all possible information. If you have questions about this medicine, talk to your doctor, pharmacist, or health care provider.  2019 Elsevier/Gold Standard (2013-09-17 11:10:35)  

## 2018-09-07 NOTE — Progress Notes (Signed)
Please pick up your contrast at least a few days prior to the appointment  You have been scheduled for a CT scan of the abdomen and pelvis at [facility] ([address]---this is in the same building as [facility]).   You are scheduled on 3/2  at 1 pm. You should arrive 15 minutes prior to your appointment time for registration. Please follow the written instructions below on the day of your exam:  WARNING: IF YOU ARE ALLERGIC TO IODINE/X-RAY DYE, PLEASE NOTIFY RADIOLOGY IMMEDIATELY AT [phone]! YOU WILL BE GIVEN A 13 HOUR PREMEDICATION PREP.  1) Do not eat or drink anything after 9 am (4 hours prior to your test) 2) You have been given 2 bottles of oral contrast to drink. The solution may taste better if refrigerated, but do NOT add ice or any other liquid to this solution. Shake well before drinking.    Drink 1 bottle of contrast @ 11 am (2 hours prior to your exam)  Drink 1 bottle of contrast @ 12 pm  (1 hour prior to your exam)  You may take any medications as prescribed with a small amount of water, if necessary. If you take any of the following medications: METFORMIN, GLUCOPHAGE, GLUCOVANCE, AVANDAMET, RIOMET, FORTAMET, [place] MET, JANUMET, GLUMETZA or METAGLIP, you MAY be asked to HOLD this medication 48 hours AFTER the exam.  The purpose of you drinking the oral contrast is to aid in the visualization of your intestinal tract. The contrast solution may cause some diarrhea. Depending on your individual set of symptoms, you may also receive an intravenous injection of x-ray contrast/dye. Plan on being at [facility] for 30 minutes or longer, depending on the type of exam you are having performed.  This test typically takes 30-45 minutes to complete.  If you have any questions regarding your exam or if you need to reschedule, you may call the CT department at [phone] between the hours of 8:00 am and 5:00 pm,  Monday-Friday.  ________________________________________________________________________

## 2018-09-10 ENCOUNTER — Ambulatory Visit
Admission: RE | Admit: 2018-09-10 | Discharge: 2018-09-10 | Disposition: A | Discharge status: Home / Self Care | Payer: Commercial Managed Care - PPO | Source: Ambulatory Visit | Attending: Radiation Oncology | Admitting: Radiation Oncology

## 2018-09-10 DIAGNOSIS — C50412 Malignant neoplasm of upper-outer quadrant of left female breast: Secondary | ICD-10-CM

## 2018-09-10 DIAGNOSIS — Z5111 Encounter for antineoplastic chemotherapy: Secondary | ICD-10-CM | POA: Diagnosis not present

## 2018-09-10 DIAGNOSIS — Z170 Estrogen receptor positive status [ER+]: Secondary | ICD-10-CM

## 2018-09-10 MED ORDER — RADIAPLEXRX EX GEL
Freq: Once | CUTANEOUS | Status: AC
  Administered 2018-09-10: 19:00:00 via TOPICAL

## 2018-09-10 MED ORDER — ALRA NON-METALLIC DEODORANT (RAD-ONC)
1.0000 "application " | Freq: Once | TOPICAL | Status: AC
  Administered 2018-09-10: 1 via TOPICAL

## 2018-09-10 NOTE — Progress Notes (Signed)

## 2018-09-11 ENCOUNTER — Ambulatory Visit: Payer: Commercial Managed Care - PPO

## 2018-09-11 ENCOUNTER — Ambulatory Visit
Admission: RE | Admit: 2018-09-11 | Discharge: 2018-09-11 | Disposition: A | Discharge status: Home / Self Care | Payer: Commercial Managed Care - PPO | Source: Ambulatory Visit | Attending: Radiation Oncology | Admitting: Radiation Oncology

## 2018-09-11 ENCOUNTER — Ambulatory Visit: Payer: Commercial Managed Care - PPO | Admitting: Gastroenterology

## 2018-09-11 DIAGNOSIS — Z5111 Encounter for antineoplastic chemotherapy: Secondary | ICD-10-CM | POA: Diagnosis not present

## 2018-09-11 DIAGNOSIS — M25611 Stiffness of right shoulder, not elsewhere classified: Secondary | ICD-10-CM

## 2018-09-11 DIAGNOSIS — M25612 Stiffness of left shoulder, not elsewhere classified: Secondary | ICD-10-CM

## 2018-09-11 DIAGNOSIS — M6281 Muscle weakness (generalized): Secondary | ICD-10-CM

## 2018-09-11 DIAGNOSIS — M25512 Pain in left shoulder: Secondary | ICD-10-CM

## 2018-09-11 DIAGNOSIS — Z483 Aftercare following surgery for neoplasm: Secondary | ICD-10-CM

## 2018-09-11 NOTE — Therapy (Signed)
[place], [place], [address] Phone: [phone]   Fax:  [phone]  Physical Therapy Treatment  Patient Details  Name: Kristi [name] MRN: [id number] Date of Birth: 1975-08-15 Referring Provider (PT): Dr. Donne Hazel   Encounter Date: 09/11/2018  PT End of Session - 09/11/18 0846    Visit Number  9    Number of Visits  13    Date for PT Re-Evaluation  10/04/18    PT Start Time  0801    PT Stop Time  0845    PT Time Calculation (min)  44 min    Activity Tolerance  Patient tolerated treatment well    Behavior During Therapy  [facility] for tasks assessed/performed       Past Medical History:  Diagnosis Date  . Breast cancer, left ([place]) 12/2017  . Endometriosis   . Family history of breast cancer   . Family history of ovarian cancer   . Family history of pancreatic cancer   . Family history of prostate cancer   . History of chemotherapy    finished chemo 06/18/2018  . Migraines   . PONV (postoperative nausea and vomiting)   . Wears contact lenses     Past Surgical History:  Procedure Laterality Date  . AXILLARY LYMPH NODE DISSECTION Left 07/24/2018  . AXILLARY LYMPH NODE DISSECTION Left 07/24/2018   Procedure: LEFT AXILLARY LYMPH NODE DISSECTION;  Surgeon: Rolm Bookbinder, MD;  Location: [facility];  Service: General;  Laterality: Left;  GENERAL WITH PEC BLOCK  . BREAST RECONSTRUCTION WITH PLACEMENT OF TISSUE EXPANDER AND ALLODERM Bilateral 07/02/2018   Procedure: BILATERAL BREAST RECONSTRUCTION WITH PLACEMENT OF TISSUE EXPANDER AND ACELLULAR DERMIS;  Surgeon: Irene Limbo, MD;  Location: [facility];  Service: Plastics;  Laterality: Bilateral;  . COLONOSCOPY    . MASTECTOMY WITH RADIOACTIVE SEED GUIDED EXCISION AND AXILLARY SENTINEL LYMPH NODE BIOPSY Bilateral 07/02/2018   Procedure: BILATERAL NIPPLE SPARING MASTECTOMIES WITH LEFT AXILLARY SENTINEL LYMPH NODE BIOPSY AND  LEFT SEED GUIDED NODE EXCISION;  Surgeon: Rolm Bookbinder, MD;  Location: [facility];  Service: General;  Laterality: Bilateral;  . NASAL POLYP EXCISION    . OVARIAN CYST REMOVAL    . PORTA CATH REMOVAL Right 07/02/2018   Procedure: PORTA CATH REMOVAL;  Surgeon: Rolm Bookbinder, MD;  Location: [facility];  Service: General;  Laterality: Right;  . PORTACATH PLACEMENT Right 01/15/2018   Procedure: INSERTION PORT-A-CATH WITH [place];  Surgeon: Rolm Bookbinder, MD;  Location: [facility];  Service: General;  Laterality: Right;  . WISDOM TOOTH EXTRACTION      There were no vitals filed for this visit.  Subjective Assessment - 09/11/18 0803    Subjective  The exercises she gave me last time are helping my nighttime Lt shoulder pain. I still have it but it's intermittent now and not every night (3-4x/wk). I just feel really tight in my axilla this morning.     Pertinent History  bilateral mastectomy on 07/02/18 for left breast cancer, SLNB then ALND on 07/24/18, pt has completed chemotherapy 16 treatments over 5 months, pt will begin radiation soon    Patient Stated Goals  to get arm in position for radiation, strengthening L UE    Currently in Pain?  No/denies                       [facility] Adult PT Treatment/Exercise - 09/11/18 0001  Manual Therapy   Myofascial Release  to left axilla and also UE pulling all during P/ROM    Passive ROM  to left shoulder in supine into flexion, abduction, and D2 with prolonged holds                  PT Long Term Goals - 09/06/18 1519      PT LONG TERM GOAL #1   Title  Pt to demonstrate 160 degrees of L shoulder flexion to allow her to reach overhead and to allow pt to get in position for radiation    Baseline  95, 09/06/18- 160    Time  4    Period  Weeks    Status  Achieved      PT LONG TERM GOAL #2   Title  Pt to demonstraste 160 degrees of L shoulder abduction to allow her to  reach out to the side and to allow pt to get in position for radiation    Baseline  62, 09/06/18- 140    Time  4    Period  Weeks    Status  On-going      PT LONG TERM GOAL #3   Title  Pt to be independent in a home exercise program for continued strengthening and stretching    Time  4    Period  Weeks    Status  On-going      PT LONG TERM GOAL #4   Title  Pt to obtain sleeve and glove for prophylactic use when flying to decrease risk of lymphedema    Baseline  09/06/18- it has been ordered    Time  4    Period  Weeks    Status  On-going      PT LONG TERM GOAL #5   Title  Pt to report a 50% improvement in pain when moving left shoulder to allow improved comfort    Baseline  09/06/18- 75%     Time  4    Period  Weeks    Status  Achieved            Plan - 09/11/18 0847    Clinical Impression Statement  Pt reports doing well with new HEP and did not need review of this so focused on manual therapy this morning to decrease her Lt axillary tightness and to decrease cording. At end of session pt reported feeling much looser. Also encouraged her to work stretches into her ADLs throughout the day and she verbalized understanding.    Rehab Potential  Good    Clinical Impairments Affecting Rehab Potential  pt to begin radiation soon    PT Frequency  1x / week    PT Duration  4 weeks    PT Treatment/Interventions  ADLs/Self Care Home Management;Therapeutic activities;Therapeutic exercise;Manual techniques;Patient/family education;Scar mobilization;Passive range of motion;Joint Manipulations    PT Next Visit Plan  assess goals, PROM to left shoulder, myofascial to cording in L armpit    Consulted and Agree with Plan of Care  Patient       Patient will benefit from skilled therapeutic intervention in order to improve the following deficits and impairments:  Pain, Increased fascial restricitons, Decreased scar mobility, Decreased range of motion, Decreased strength, Impaired UE functional  use, Decreased knowledge of precautions  Visit Diagnosis: Stiffness of left shoulder, not elsewhere classified  Acute pain of left shoulder  Muscle weakness (generalized)  Stiffness of right shoulder, not elsewhere classified  Aftercare following surgery for neoplasm  Problem List Patient Active Problem List   Diagnosis Date Noted  . Breast cancer, left ([place]) 07/02/2018  . Chemotherapy induced neutropenia ([place]) 06/06/2018  . Port-A-Cath in place 04/20/2018  . Genetic testing 01/29/2018  . Family history of breast cancer   . Family history of prostate cancer   . Family history of ovarian cancer   . Family history of pancreatic cancer   . Malignant neoplasm of upper-outer quadrant of left breast in female, estrogen receptor positive ([place]) 01/04/2018    Otelia Limes, PTA 09/11/2018, 8:49 AM  [place], [place], [address] Phone: [phone]   Fax:  [phone]  Name: Kristi [name] MRN: [id number] Date of Birth: [birth year]

## 2018-09-12 ENCOUNTER — Ambulatory Visit
Admission: RE | Admit: 2018-09-12 | Discharge: 2018-09-12 | Disposition: A | Discharge status: Home / Self Care | Payer: Commercial Managed Care - PPO | Source: Ambulatory Visit | Attending: Radiation Oncology | Admitting: Radiation Oncology

## 2018-09-12 DIAGNOSIS — Z5111 Encounter for antineoplastic chemotherapy: Secondary | ICD-10-CM | POA: Diagnosis not present

## 2018-09-13 ENCOUNTER — Ambulatory Visit
Admission: RE | Admit: 2018-09-13 | Discharge: 2018-09-13 | Disposition: A | Discharge status: Home / Self Care | Payer: Commercial Managed Care - PPO | Source: Ambulatory Visit | Attending: Radiation Oncology | Admitting: Radiation Oncology

## 2018-09-13 ENCOUNTER — Encounter: Payer: Commercial Managed Care - PPO | Admitting: Physical Therapy

## 2018-09-13 DIAGNOSIS — Z5111 Encounter for antineoplastic chemotherapy: Secondary | ICD-10-CM | POA: Diagnosis not present

## 2018-09-14 ENCOUNTER — Ambulatory Visit
Admission: RE | Admit: 2018-09-14 | Discharge: 2018-09-14 | Disposition: A | Discharge status: Home / Self Care | Payer: Commercial Managed Care - PPO | Source: Ambulatory Visit | Attending: Radiation Oncology | Admitting: Radiation Oncology

## 2018-09-14 DIAGNOSIS — Z5111 Encounter for antineoplastic chemotherapy: Secondary | ICD-10-CM | POA: Diagnosis not present

## 2018-09-17 ENCOUNTER — Encounter: Payer: Commercial Managed Care - PPO | Admitting: Physical Therapy

## 2018-09-17 ENCOUNTER — Ambulatory Visit
Admission: RE | Admit: 2018-09-17 | Discharge: 2018-09-17 | Disposition: A | Discharge status: Home / Self Care | Payer: Commercial Managed Care - PPO | Source: Ambulatory Visit | Attending: Radiation Oncology | Admitting: Radiation Oncology

## 2018-09-17 DIAGNOSIS — Z5111 Encounter for antineoplastic chemotherapy: Secondary | ICD-10-CM | POA: Diagnosis not present

## 2018-09-18 ENCOUNTER — Telehealth: Payer: Self-pay | Admitting: Gastroenterology

## 2018-09-18 ENCOUNTER — Ambulatory Visit
Admission: RE | Admit: 2018-09-18 | Discharge: 2018-09-18 | Disposition: A | Discharge status: Home / Self Care | Payer: Commercial Managed Care - PPO | Source: Ambulatory Visit | Attending: Radiation Oncology | Admitting: Radiation Oncology

## 2018-09-18 DIAGNOSIS — Z5111 Encounter for antineoplastic chemotherapy: Secondary | ICD-10-CM | POA: Diagnosis not present

## 2018-09-18 NOTE — Telephone Encounter (Signed)
The pt states she did not receive the instructions via My Chart.  I have resent and she did confirm receipt and all questions answered

## 2018-09-18 NOTE — Telephone Encounter (Signed)
Pt is sched for CT Friday.  Pt requested instruction letter to be sent to her house.

## 2018-09-19 ENCOUNTER — Ambulatory Visit
Admission: RE | Admit: 2018-09-19 | Discharge: 2018-09-19 | Disposition: A | Discharge status: Home / Self Care | Payer: Commercial Managed Care - PPO | Source: Ambulatory Visit | Attending: Radiation Oncology | Admitting: Radiation Oncology

## 2018-09-19 DIAGNOSIS — Z5111 Encounter for antineoplastic chemotherapy: Secondary | ICD-10-CM | POA: Diagnosis not present

## 2018-09-20 ENCOUNTER — Ambulatory Visit: Payer: Commercial Managed Care - PPO | Admitting: Physical Therapy

## 2018-09-20 ENCOUNTER — Ambulatory Visit
Admission: RE | Admit: 2018-09-20 | Discharge: 2018-09-20 | Disposition: A | Discharge status: Home / Self Care | Payer: Commercial Managed Care - PPO | Source: Ambulatory Visit | Attending: Radiation Oncology | Admitting: Radiation Oncology

## 2018-09-20 ENCOUNTER — Encounter: Payer: Self-pay | Admitting: Physical Therapy

## 2018-09-20 ENCOUNTER — Other Ambulatory Visit: Payer: Self-pay

## 2018-09-20 DIAGNOSIS — M25512 Pain in left shoulder: Secondary | ICD-10-CM

## 2018-09-20 DIAGNOSIS — Z5111 Encounter for antineoplastic chemotherapy: Secondary | ICD-10-CM | POA: Diagnosis not present

## 2018-09-20 DIAGNOSIS — M25612 Stiffness of left shoulder, not elsewhere classified: Secondary | ICD-10-CM

## 2018-09-20 DIAGNOSIS — M6281 Muscle weakness (generalized): Secondary | ICD-10-CM

## 2018-09-20 NOTE — Therapy (Signed)
[place], [place], [address] Phone: [phone]   Fax:  [phone]  Physical Therapy Treatment  Patient Details  Name: Kristi [name] MRN: [id number] Date of Birth: 11-Sep-1975 Referring Provider (PT): Dr. Donne Hazel   Encounter Date: 09/20/2018  PT End of Session - 09/20/18 1647    Visit Number  10    Number of Visits  13    Date for PT Re-Evaluation  10/04/18    Authorization Type  UMR- no auth    PT Start Time  1604    PT Stop Time  1645    PT Time Calculation (min)  41 min    Activity Tolerance  Patient tolerated treatment well    Behavior During Therapy  [facility] for tasks assessed/performed       Past Medical History:  Diagnosis Date  . Breast cancer, left ([place]) 12/2017  . Endometriosis   . Family history of breast cancer   . Family history of ovarian cancer   . Family history of pancreatic cancer   . Family history of prostate cancer   . History of chemotherapy    finished chemo 06/18/2018  . Migraines   . PONV (postoperative nausea and vomiting)   . Wears contact lenses     Past Surgical History:  Procedure Laterality Date  . AXILLARY LYMPH NODE DISSECTION Left 07/24/2018  . AXILLARY LYMPH NODE DISSECTION Left 07/24/2018   Procedure: LEFT AXILLARY LYMPH NODE DISSECTION;  Surgeon: Rolm Bookbinder, MD;  Location: [place];  Service: General;  Laterality: Left;  GENERAL WITH PEC BLOCK  . BREAST RECONSTRUCTION WITH PLACEMENT OF TISSUE EXPANDER AND ALLODERM Bilateral 07/02/2018   Procedure: BILATERAL BREAST RECONSTRUCTION WITH PLACEMENT OF TISSUE EXPANDER AND ACELLULAR DERMIS;  Surgeon: Irene Limbo, MD;  Location: [place];  Service: Plastics;  Laterality: Bilateral;  . COLONOSCOPY    . MASTECTOMY WITH RADIOACTIVE SEED GUIDED EXCISION AND AXILLARY SENTINEL LYMPH NODE BIOPSY Bilateral 07/02/2018   Procedure: BILATERAL NIPPLE SPARING MASTECTOMIES WITH LEFT  AXILLARY SENTINEL LYMPH NODE BIOPSY AND LEFT SEED GUIDED NODE EXCISION;  Surgeon: Rolm Bookbinder, MD;  Location: [place];  Service: General;  Laterality: Bilateral;  . NASAL POLYP EXCISION    . OVARIAN CYST REMOVAL    . PORTA CATH REMOVAL Right 07/02/2018   Procedure: PORTA CATH REMOVAL;  Surgeon: Rolm Bookbinder, MD;  Location: [place];  Service: General;  Laterality: Right;  . PORTACATH PLACEMENT Right 01/15/2018   Procedure: INSERTION PORT-A-CATH WITH [place];  Surgeon: Rolm Bookbinder, MD;  Location: [place];  Service: General;  Laterality: Right;  . WISDOM TOOTH EXTRACTION      There were no vitals filed for this visit.  Subjective Assessment - 09/20/18 1606    Subjective  My shoulder is not hurting but it is still tight.     Pertinent History  bilateral mastectomy on 07/02/18 for left breast cancer, SLNB then ALND on 07/24/18, pt has completed chemotherapy 16 treatments over 5 months, pt will begin radiation soon    Patient Stated Goals  to get arm in position for radiation, strengthening L UE    Currently in Pain?  No/denies    Pain Score  0-No pain         [facility] PT Assessment - 09/20/18 0001      AROM   Left Shoulder Flexion  158 Degrees   162 after PROM   Left Shoulder ABduction  147 Degrees  159 after PROM                  [facility] Adult PT Treatment/Exercise - 09/20/18 0001      Shoulder Exercises: Standing   Other Standing Exercises  instructed pt in pec minor stretch on L using corner of wall to block anterior shoulder and raising L arm up against wall with 30 sec hold      Shoulder Exercises: Pulleys   Flexion  2 minutes    ABduction  2 minutes      Shoulder Exercises: Therapy Ball   Flexion  10 reps   with stretch at end range   ABduction  Left;10 reps   with stretch at end range     Manual Therapy   Myofascial Release  to left axilla and also UE pulling all during P/ROM    Passive ROM  to  left shoulder in supine into flexion, abduction, and D2 with prolonged holds                  PT Long Term Goals - 09/06/18 1519      PT LONG TERM GOAL #1   Title  Pt to demonstrate 160 degrees of L shoulder flexion to allow her to reach overhead and to allow pt to get in position for radiation    Baseline  95, 09/06/18- 160    Time  4    Period  Weeks    Status  Achieved      PT LONG TERM GOAL #2   Title  Pt to demonstraste 160 degrees of L shoulder abduction to allow her to reach out to the side and to allow pt to get in position for radiation    Baseline  62, 09/06/18- 140    Time  4    Period  Weeks    Status  On-going      PT LONG TERM GOAL #3   Title  Pt to be independent in a home exercise program for continued strengthening and stretching    Time  4    Period  Weeks    Status  On-going      PT LONG TERM GOAL #4   Title  Pt to obtain sleeve and glove for prophylactic use when flying to decrease risk of lymphedema    Baseline  09/06/18- it has been ordered    Time  4    Period  Weeks    Status  On-going      PT LONG TERM GOAL #5   Title  Pt to report a 50% improvement in pain when moving left shoulder to allow improved comfort    Baseline  09/06/18- 75%     Time  4    Period  Weeks    Status  Achieved            Plan - 09/20/18 1647    Clinical Impression Statement  Pt continues to have increased tightness of left pec which limits left shoulder abduction. Focused on manual therapy and improving left shoulder abduction. Remeasured AROM following PROM and it improved greatly. Then continued with AAROM exercises as pt's shoulder was getting sore.     Rehab Potential  Good    Clinical Impairments Affecting Rehab Potential  pt to begin radiation soon    PT Frequency  1x / week    PT Duration  4 weeks    PT Treatment/Interventions  ADLs/Self Care Home Management;Therapeutic activities;Therapeutic exercise;Manual techniques;Patient/family education;Scar  mobilization;Passive range of motion;Joint Manipulations    PT Next Visit Plan  assess goals, PROM to left shoulder, myofascial to cording in L armpit       Patient will benefit from skilled therapeutic intervention in order to improve the following deficits and impairments:  Pain, Increased fascial restricitons, Decreased scar mobility, Decreased range of motion, Decreased strength, Impaired UE functional use, Decreased knowledge of precautions  Visit Diagnosis: Stiffness of left shoulder, not elsewhere classified  Acute pain of left shoulder  Muscle weakness (generalized)     Problem List Patient Active Problem List   Diagnosis Date Noted  . Breast cancer, left ([place]) 07/02/2018  . Chemotherapy induced neutropenia ([place]) 06/06/2018  . Port-A-Cath in place 04/20/2018  . Genetic testing 01/29/2018  . Family history of breast cancer   . Family history of prostate cancer   . Family history of ovarian cancer   . Family history of pancreatic cancer   . Malignant neoplasm of upper-outer quadrant of left breast in female, estrogen receptor positive ([place]) 01/04/2018    Allyson Sabal [facility] 09/20/2018, 4:50 PM  [place], [place], [address] Phone: [phone]   Fax:  [phone]  Name: Kristi [name] MRN: [id number] Date of Birth: 03-05-1976  Manus Gunning, PT 09/20/18 4:50 PM

## 2018-09-21 ENCOUNTER — Ambulatory Visit
Admission: RE | Admit: 2018-09-21 | Discharge: 2018-09-21 | Disposition: A | Discharge status: Home / Self Care | Payer: Commercial Managed Care - PPO | Source: Ambulatory Visit | Attending: Radiation Oncology | Admitting: Radiation Oncology

## 2018-09-21 DIAGNOSIS — Z5111 Encounter for antineoplastic chemotherapy: Secondary | ICD-10-CM | POA: Diagnosis not present

## 2018-09-23 DIAGNOSIS — C50412 Malignant neoplasm of upper-outer quadrant of left female breast: Secondary | ICD-10-CM | POA: Insufficient documentation

## 2018-09-23 DIAGNOSIS — Z170 Estrogen receptor positive status [ER+]: Secondary | ICD-10-CM | POA: Insufficient documentation

## 2018-09-23 DIAGNOSIS — Z5111 Encounter for antineoplastic chemotherapy: Secondary | ICD-10-CM | POA: Insufficient documentation

## 2018-09-23 DIAGNOSIS — Z510 Encounter for antineoplastic radiation therapy: Secondary | ICD-10-CM | POA: Insufficient documentation

## 2018-09-24 ENCOUNTER — Ambulatory Visit (INDEPENDENT_AMBULATORY_CARE_PROVIDER_SITE_OTHER)
Admission: RE | Admit: 2018-09-24 | Discharge: 2018-09-24 | Disposition: A | Discharge status: Home / Self Care | Payer: Commercial Managed Care - PPO | Source: Ambulatory Visit | Attending: Gastroenterology | Admitting: Gastroenterology

## 2018-09-24 ENCOUNTER — Ambulatory Visit
Admission: RE | Admit: 2018-09-24 | Discharge: 2018-09-24 | Disposition: A | Discharge status: Home / Self Care | Payer: Commercial Managed Care - PPO | Source: Ambulatory Visit | Attending: Radiation Oncology | Admitting: Radiation Oncology

## 2018-09-24 DIAGNOSIS — Z170 Estrogen receptor positive status [ER+]: Secondary | ICD-10-CM

## 2018-09-24 DIAGNOSIS — R110 Nausea: Secondary | ICD-10-CM

## 2018-09-24 DIAGNOSIS — C50412 Malignant neoplasm of upper-outer quadrant of left female breast: Secondary | ICD-10-CM | POA: Diagnosis not present

## 2018-09-24 DIAGNOSIS — Z510 Encounter for antineoplastic radiation therapy: Secondary | ICD-10-CM | POA: Diagnosis not present

## 2018-09-24 DIAGNOSIS — Z5111 Encounter for antineoplastic chemotherapy: Secondary | ICD-10-CM | POA: Diagnosis present

## 2018-09-24 DIAGNOSIS — R1013 Epigastric pain: Secondary | ICD-10-CM

## 2018-09-24 DIAGNOSIS — R634 Abnormal weight loss: Secondary | ICD-10-CM | POA: Diagnosis not present

## 2018-09-24 MED ORDER — IOPAMIDOL (ISOVUE-300) INJECTION 61%
100.0000 mL | Freq: Once | INTRAVENOUS | Status: AC | PRN
  Administered 2018-09-24: 100 mL via INTRAVENOUS

## 2018-09-25 ENCOUNTER — Ambulatory Visit
Admission: RE | Admit: 2018-09-25 | Discharge: 2018-09-25 | Disposition: A | Discharge status: Home / Self Care | Payer: Commercial Managed Care - PPO | Source: Ambulatory Visit | Attending: Radiation Oncology | Admitting: Radiation Oncology

## 2018-09-25 ENCOUNTER — Encounter: Payer: Commercial Managed Care - PPO | Admitting: Physical Therapy

## 2018-09-25 DIAGNOSIS — Z5111 Encounter for antineoplastic chemotherapy: Secondary | ICD-10-CM | POA: Diagnosis not present

## 2018-09-26 ENCOUNTER — Ambulatory Visit
Admission: RE | Admit: 2018-09-26 | Discharge: 2018-09-26 | Disposition: A | Discharge status: Home / Self Care | Payer: Commercial Managed Care - PPO | Source: Ambulatory Visit | Attending: Radiation Oncology | Admitting: Radiation Oncology

## 2018-09-26 DIAGNOSIS — Z5111 Encounter for antineoplastic chemotherapy: Secondary | ICD-10-CM | POA: Diagnosis not present

## 2018-09-27 ENCOUNTER — Encounter: Payer: Self-pay | Admitting: Physical Therapy

## 2018-09-27 ENCOUNTER — Other Ambulatory Visit: Payer: Self-pay

## 2018-09-27 ENCOUNTER — Ambulatory Visit: Payer: Commercial Managed Care - PPO | Admitting: Physical Therapy

## 2018-09-27 ENCOUNTER — Ambulatory Visit
Admission: RE | Admit: 2018-09-27 | Discharge: 2018-09-27 | Disposition: A | Discharge status: Home / Self Care | Payer: Commercial Managed Care - PPO | Source: Ambulatory Visit | Attending: Radiation Oncology | Admitting: Radiation Oncology

## 2018-09-27 DIAGNOSIS — M6281 Muscle weakness (generalized): Secondary | ICD-10-CM | POA: Insufficient documentation

## 2018-09-27 DIAGNOSIS — Z5111 Encounter for antineoplastic chemotherapy: Secondary | ICD-10-CM | POA: Diagnosis not present

## 2018-09-27 DIAGNOSIS — Z483 Aftercare following surgery for neoplasm: Secondary | ICD-10-CM | POA: Insufficient documentation

## 2018-09-27 DIAGNOSIS — M25612 Stiffness of left shoulder, not elsewhere classified: Secondary | ICD-10-CM | POA: Insufficient documentation

## 2018-09-27 DIAGNOSIS — M25512 Pain in left shoulder: Secondary | ICD-10-CM | POA: Insufficient documentation

## 2018-09-27 DIAGNOSIS — M25611 Stiffness of right shoulder, not elsewhere classified: Secondary | ICD-10-CM | POA: Insufficient documentation

## 2018-09-27 NOTE — Therapy (Addendum)
[place], [place], [address] Phone: [phone]   Fax:  [phone]  Physical Therapy Treatment  Patient Details  Name: Kristi [name] MRN: [id number] Date of Birth: [birth year] Referring Provider (PT): Dr. Donne Hazel   Encounter Date: 09/27/2018  PT End of Session - 09/27/18 1652    Visit Number  11    Number of Visits  13    Date for PT Re-Evaluation  10/04/18    Authorization Type  UMR- no auth    PT Start Time  1348    PT Stop Time  1430    PT Time Calculation (min)  42 min    Activity Tolerance  Patient tolerated treatment well    Behavior During Therapy  [facility] for tasks assessed/performed       Past Medical History:  Diagnosis Date  . Breast cancer, left ([place]) 12/2017  . Endometriosis   . Family history of breast cancer   . Family history of ovarian cancer   . Family history of pancreatic cancer   . Family history of prostate cancer   . History of chemotherapy    finished chemo 06/18/2018  . Migraines   . PONV (postoperative nausea and vomiting)   . Wears contact lenses     Past Surgical History:  Procedure Laterality Date  . AXILLARY LYMPH NODE DISSECTION Left 07/24/2018  . AXILLARY LYMPH NODE DISSECTION Left 07/24/2018   Procedure: LEFT AXILLARY LYMPH NODE DISSECTION;  Surgeon: Rolm Bookbinder, MD;  Location: [place];  Service: General;  Laterality: Left;  GENERAL WITH PEC BLOCK  . BREAST RECONSTRUCTION WITH PLACEMENT OF TISSUE EXPANDER AND ALLODERM Bilateral 07/02/2018   Procedure: BILATERAL BREAST RECONSTRUCTION WITH PLACEMENT OF TISSUE EXPANDER AND ACELLULAR DERMIS;  Surgeon: Irene Limbo, MD;  Location: [place];  Service: Plastics;  Laterality: Bilateral;  . COLONOSCOPY    . MASTECTOMY WITH RADIOACTIVE SEED GUIDED EXCISION AND AXILLARY SENTINEL LYMPH NODE BIOPSY Bilateral 07/02/2018   Procedure: BILATERAL NIPPLE SPARING MASTECTOMIES WITH LEFT  AXILLARY SENTINEL LYMPH NODE BIOPSY AND LEFT SEED GUIDED NODE EXCISION;  Surgeon: Rolm Bookbinder, MD;  Location: [place];  Service: General;  Laterality: Bilateral;  . NASAL POLYP EXCISION    . OVARIAN CYST REMOVAL    . PORTA CATH REMOVAL Right 07/02/2018   Procedure: PORTA CATH REMOVAL;  Surgeon: Rolm Bookbinder, MD;  Location: [place];  Service: General;  Laterality: Right;  . PORTACATH PLACEMENT Right 01/15/2018   Procedure: INSERTION PORT-A-CATH WITH [place];  Surgeon: Rolm Bookbinder, MD;  Location: [place];  Service: General;  Laterality: Right;  . WISDOM TOOTH EXTRACTION      There were no vitals filed for this visit.  Subjective Assessment - 09/27/18 1350    Subjective  I had trouble with the 2 new stretches. I had trouble getting my arm up for radiation. I feel like my progress is stuck.     Pertinent History  bilateral mastectomy on 07/02/18 for left breast cancer, SLNB then ALND on 07/24/18, pt has completed chemotherapy 16 treatments over 5 months, pt will begin radiation soon    Patient Stated Goals  to get arm in position for radiation, strengthening L UE    Currently in Pain?  No/denies    Pain Score  0-No pain         [facility] PT Assessment - 09/27/18 0001      AROM   Left Shoulder Flexion  158 Degrees  Left Shoulder ABduction  151 Degrees                   [facility] Adult PT Treatment/Exercise - 09/27/18 0001      Manual Therapy   Manual Therapy  Myofascial release;Passive ROM;Joint mobilization    Myofascial Release  to left axilla and also UE pulling all during P/ROM, gentle posterior and inferior glides with pt in supine    Passive ROM  to left shoulder in supine into flexion, abduction, and D2 with prolonged holds                  PT Long Term Goals - 09/06/18 1519      PT LONG TERM GOAL #1   Title  Pt to demonstrate 160 degrees of L shoulder flexion to allow her to reach overhead  and to allow pt to get in position for radiation    Baseline  95, 09/06/18- 160    Time  4    Period  Weeks    Status  Achieved      PT LONG TERM GOAL #2   Title  Pt to demonstraste 160 degrees of L shoulder abduction to allow her to reach out to the side and to allow pt to get in position for radiation    Baseline  62, 09/06/18- 140    Time  4    Period  Weeks    Status  On-going      PT LONG TERM GOAL #3   Title  Pt to be independent in a home exercise program for continued strengthening and stretching    Time  4    Period  Weeks    Status  On-going      PT LONG TERM GOAL #4   Title  Pt to obtain sleeve and glove for prophylactic use when flying to decrease risk of lymphedema    Baseline  09/06/18- it has been ordered    Time  4    Period  Weeks    Status  On-going      PT LONG TERM GOAL #5   Title  Pt to report a 50% improvement in pain when moving left shoulder to allow improved comfort    Baseline  09/06/18- 75%     Time  4    Period  Weeks    Status  Achieved            Plan - 09/27/18 1653    Clinical Impression Statement  Pt is still having increased tightness and pain in left shoulder. She is currently undergoing radiation and is only able to come to PT once a week. She has gained a few degrees of AROM following PROM and joint mobilizations today. She reported that her shoulder was more uncomfortable during radiation last time. Educated pt that it may be becuase she is only able to come to therapy once a week and radiation can cause increased tightness. Added joint moblizations today to help improve ROM.     Rehab Potential  Good    Clinical Impairments Affecting Rehab Potential  pt to begin radiation soon    PT Frequency  1x / week    PT Duration  4 weeks    PT Treatment/Interventions  ADLs/Self Care Home Management;Therapeutic activities;Therapeutic exercise;Manual techniques;Patient/family education;Scar mobilization;Passive range of motion;Joint Manipulations     PT Next Visit Plan  assess goals, PROM to left shoulder, myofascial to cording in L armpit    PT Home  Exercise Plan  post op breast exercises, supine cane, supine scap, Access Code: [email or web address]     Consulted and Agree with Plan of Care  Patient       Patient will benefit from skilled therapeutic intervention in order to improve the following deficits and impairments:  Pain, Increased fascial restricitons, Decreased scar mobility, Decreased range of motion, Decreased strength, Impaired UE functional use, Decreased knowledge of precautions  Visit Diagnosis: Stiffness of left shoulder, not elsewhere classified  Acute pain of left shoulder     Problem List Patient Active Problem List   Diagnosis Date Noted  . Breast cancer, left ([place]) 07/02/2018  . Chemotherapy induced neutropenia ([place]) 06/06/2018  . Port-A-Cath in place 04/20/2018  . Genetic testing 01/29/2018  . Family history of breast cancer   . Family history of prostate cancer   . Family history of ovarian cancer   . Family history of pancreatic cancer   . Malignant neoplasm of upper-outer quadrant of left breast in female, estrogen receptor positive ([place]) 01/04/2018    Allyson Sabal [facility] 09/27/2018, 4:55 PM  [place], [place], [address] Phone: [phone]   Fax:  [phone]  Name: Kristi [name] MRN: [id number] Date of Birth: Sep 23, 1975  Manus Gunning, PT 09/27/18 4:55 PM

## 2018-09-28 ENCOUNTER — Ambulatory Visit
Admission: RE | Admit: 2018-09-28 | Discharge: 2018-09-28 | Disposition: A | Discharge status: Home / Self Care | Payer: Commercial Managed Care - PPO | Source: Ambulatory Visit | Attending: Radiation Oncology | Admitting: Radiation Oncology

## 2018-09-28 DIAGNOSIS — Z5111 Encounter for antineoplastic chemotherapy: Secondary | ICD-10-CM | POA: Diagnosis not present

## 2018-10-01 ENCOUNTER — Ambulatory Visit
Admission: RE | Admit: 2018-10-01 | Discharge: 2018-10-01 | Disposition: A | Discharge status: Home / Self Care | Payer: Commercial Managed Care - PPO | Source: Ambulatory Visit | Attending: Radiation Oncology | Admitting: Radiation Oncology

## 2018-10-01 ENCOUNTER — Ambulatory Visit: Payer: Commercial Managed Care - PPO | Admitting: Physical Therapy

## 2018-10-01 ENCOUNTER — Encounter: Payer: Self-pay | Admitting: Physical Therapy

## 2018-10-01 ENCOUNTER — Other Ambulatory Visit: Payer: Self-pay

## 2018-10-01 DIAGNOSIS — M25612 Stiffness of left shoulder, not elsewhere classified: Secondary | ICD-10-CM

## 2018-10-01 DIAGNOSIS — Z170 Estrogen receptor positive status [ER+]: Secondary | ICD-10-CM

## 2018-10-01 DIAGNOSIS — Z5111 Encounter for antineoplastic chemotherapy: Secondary | ICD-10-CM | POA: Diagnosis not present

## 2018-10-01 DIAGNOSIS — M25512 Pain in left shoulder: Secondary | ICD-10-CM

## 2018-10-01 DIAGNOSIS — C50412 Malignant neoplasm of upper-outer quadrant of left female breast: Secondary | ICD-10-CM

## 2018-10-01 MED ORDER — RADIAPLEXRX EX GEL
Freq: Once | CUTANEOUS | Status: AC
  Administered 2018-10-01: 17:00:00 via TOPICAL

## 2018-10-01 NOTE — Therapy (Signed)
[place], [place], [address] Phone: [phone]   Fax:  [phone]  Physical Therapy Treatment  Patient Details  Name: Kristi [name] MRN: [id number] Date of Birth: 1976/01/16 Referring Provider (PT): Dr. Donne Hazel   Encounter Date: 10/01/2018  PT End of Session - 10/01/18 1432    Visit Number  12    Number of Visits  13    Date for PT Re-Evaluation  10/04/18    Authorization Type  UMR- no auth    PT Start Time  1350    PT Stop Time  1430    PT Time Calculation (min)  40 min    Activity Tolerance  Patient tolerated treatment well    Behavior During Therapy  [facility] for tasks assessed/performed       Past Medical History:  Diagnosis Date  . Breast cancer, left ([place]) 12/2017  . Endometriosis   . Family history of breast cancer   . Family history of ovarian cancer   . Family history of pancreatic cancer   . Family history of prostate cancer   . History of chemotherapy    finished chemo 06/18/2018  . Migraines   . PONV (postoperative nausea and vomiting)   . Wears contact lenses     Past Surgical History:  Procedure Laterality Date  . AXILLARY LYMPH NODE DISSECTION Left 07/24/2018  . AXILLARY LYMPH NODE DISSECTION Left 07/24/2018   Procedure: LEFT AXILLARY LYMPH NODE DISSECTION;  Surgeon: Rolm Bookbinder, MD;  Location: [place];  Service: General;  Laterality: Left;  GENERAL WITH PEC BLOCK  . BREAST RECONSTRUCTION WITH PLACEMENT OF TISSUE EXPANDER AND ALLODERM Bilateral 07/02/2018   Procedure: BILATERAL BREAST RECONSTRUCTION WITH PLACEMENT OF TISSUE EXPANDER AND ACELLULAR DERMIS;  Surgeon: Irene Limbo, MD;  Location: [place];  Service: Plastics;  Laterality: Bilateral;  . COLONOSCOPY    . MASTECTOMY WITH RADIOACTIVE SEED GUIDED EXCISION AND AXILLARY SENTINEL LYMPH NODE BIOPSY Bilateral 07/02/2018   Procedure: BILATERAL NIPPLE SPARING MASTECTOMIES WITH LEFT  AXILLARY SENTINEL LYMPH NODE BIOPSY AND LEFT SEED GUIDED NODE EXCISION;  Surgeon: Rolm Bookbinder, MD;  Location: [place];  Service: General;  Laterality: Bilateral;  . NASAL POLYP EXCISION    . OVARIAN CYST REMOVAL    . PORTA CATH REMOVAL Right 07/02/2018   Procedure: PORTA CATH REMOVAL;  Surgeon: Rolm Bookbinder, MD;  Location: [place];  Service: General;  Laterality: Right;  . PORTACATH PLACEMENT Right 01/15/2018   Procedure: INSERTION PORT-A-CATH WITH [place];  Surgeon: Rolm Bookbinder, MD;  Location: [place];  Service: General;  Laterality: Right;  . WISDOM TOOTH EXTRACTION      There were no vitals filed for this visit.  Subjective Assessment - 10/01/18 1354    Subjective  I think my shoulder was a little bit better over the weekend. The stretches were easier.     Pertinent History  bilateral mastectomy on 07/02/18 for left breast cancer, SLNB then ALND on 07/24/18, pt has completed chemotherapy 16 treatments over 5 months, pt will begin radiation soon    Patient Stated Goals  to get arm in position for radiation, strengthening L UE    Currently in Pain?  No/denies    Pain Score  0-No pain         [facility] PT Assessment - 10/01/18 0001      AROM   Left Shoulder Flexion  165 Degrees    Left Shoulder ABduction  166 Degrees  [place] Adult PT Treatment/Exercise - 10/01/18 0001      Manual Therapy   Soft tissue mobilization  in R s/l to L shoulder external rotators along lateral border of scapula in area of tightness and pain    Myofascial Release  gentle inferior and posterior ossilations to left shoulder with shoulder abducted to 90 and elbow flexed to 90 to improve abduction ROM    Passive ROM  to left shoulder in supine into flexion, abduction, and D2 with prolonged holds                  PT Long Term Goals - 09/06/18 1519      PT LONG TERM GOAL #1   Title  Pt to demonstrate 160  degrees of L shoulder flexion to allow her to reach overhead and to allow pt to get in position for radiation    Baseline  95, 09/06/18- 160    Time  4    Period  Weeks    Status  Achieved      PT LONG TERM GOAL #2   Title  Pt to demonstraste 160 degrees of L shoulder abduction to allow her to reach out to the side and to allow pt to get in position for radiation    Baseline  62, 09/06/18- 140    Time  4    Period  Weeks    Status  On-going      PT LONG TERM GOAL #3   Title  Pt to be independent in a home exercise program for continued strengthening and stretching    Time  4    Period  Weeks    Status  On-going      PT LONG TERM GOAL #4   Title  Pt to obtain sleeve and glove for prophylactic use when flying to decrease risk of lymphedema    Baseline  09/06/18- it has been ordered    Time  4    Period  Weeks    Status  On-going      PT LONG TERM GOAL #5   Title  Pt to report a 50% improvement in pain when moving left shoulder to allow improved comfort    Baseline  09/06/18- 75%     Time  4    Period  Weeks    Status  Achieved            Plan - 10/01/18 1433    Clinical Impression Statement  Pt had less tightness after joint mobs last session. ROM in left shoulder has improved since last session and pt is progressing towards her goals. She was having pain in her upper arm and tenderness to touch along lateral border of scapula in area of external rotators. Performed soft tissue mobilization today to these areas with improvements noted.     Rehab Potential  Good    Clinical Impairments Affecting Rehab Potential  pt to begin radiation soon    PT Frequency  1x / week    PT Duration  4 weeks    PT Treatment/Interventions  ADLs/Self Care Home Management;Therapeutic activities;Therapeutic exercise;Manual techniques;Patient/family education;Scar mobilization;Passive range of motion;Joint Manipulations    PT Next Visit Plan  assess goals, PROM to left shoulder, myofascial to cording  in L armpit    PT Home Exercise Plan  post op breast exercises, supine cane, supine scap, Access Code: [email or web address]     Consulted and Agree with Plan of Care  Patient  Patient will benefit from skilled therapeutic intervention in order to improve the following deficits and impairments:  Pain, Increased fascial restricitons, Decreased scar mobility, Decreased range of motion, Decreased strength, Impaired UE functional use, Decreased knowledge of precautions  Visit Diagnosis: Stiffness of left shoulder, not elsewhere classified  Acute pain of left shoulder     Problem List Patient Active Problem List   Diagnosis Date Noted  . Breast cancer, left ([place]) 07/02/2018  . Chemotherapy induced neutropenia ([place]) 06/06/2018  . Port-A-Cath in place 04/20/2018  . Genetic testing 01/29/2018  . Family history of breast cancer   . Family history of prostate cancer   . Family history of ovarian cancer   . Family history of pancreatic cancer   . Malignant neoplasm of upper-outer quadrant of left breast in female, estrogen receptor positive ([place]) 01/04/2018    Allyson Sabal [facility] 10/01/2018, 2:35 PM  [place], [place], [address] Phone: [phone]   Fax:  [phone]  Name: Kristi [name] MRN: [id number] Date of Birth: 26-Jun-1976  Manus Gunning, PT 10/01/18 2:36 PM

## 2018-10-02 ENCOUNTER — Ambulatory Visit (HOSPITAL_BASED_OUTPATIENT_CLINIC_OR_DEPARTMENT_OTHER)
Admission: RE | Admit: 2018-10-02 | Discharge: 2018-10-02 | Disposition: A | Discharge status: Home / Self Care | Payer: Commercial Managed Care - PPO | Source: Ambulatory Visit | Attending: Cardiology | Admitting: Cardiology

## 2018-10-02 ENCOUNTER — Ambulatory Visit (HOSPITAL_COMMUNITY)
Admission: RE | Admit: 2018-10-02 | Discharge: 2018-10-02 | Disposition: A | Discharge status: Home / Self Care | Payer: Commercial Managed Care - PPO | Source: Ambulatory Visit | Attending: Cardiology | Admitting: Cardiology

## 2018-10-02 ENCOUNTER — Ambulatory Visit
Admission: RE | Admit: 2018-10-02 | Discharge: 2018-10-02 | Disposition: A | Discharge status: Home / Self Care | Payer: Commercial Managed Care - PPO | Source: Ambulatory Visit | Attending: Radiation Oncology | Admitting: Radiation Oncology

## 2018-10-02 ENCOUNTER — Other Ambulatory Visit: Payer: Self-pay

## 2018-10-02 ENCOUNTER — Encounter (HOSPITAL_COMMUNITY): Payer: Self-pay | Admitting: Cardiology

## 2018-10-02 VITALS — BP 118/78 | HR 100 | Wt 132.5 lb

## 2018-10-02 DIAGNOSIS — N809 Endometriosis, unspecified: Secondary | ICD-10-CM | POA: Insufficient documentation

## 2018-10-02 DIAGNOSIS — Z9013 Acquired absence of bilateral breasts and nipples: Secondary | ICD-10-CM | POA: Diagnosis not present

## 2018-10-02 DIAGNOSIS — I313 Pericardial effusion (noninflammatory): Secondary | ICD-10-CM | POA: Insufficient documentation

## 2018-10-02 DIAGNOSIS — Z170 Estrogen receptor positive status [ER+]: Secondary | ICD-10-CM

## 2018-10-02 DIAGNOSIS — C50412 Malignant neoplasm of upper-outer quadrant of left female breast: Secondary | ICD-10-CM

## 2018-10-02 DIAGNOSIS — Z79899 Other long term (current) drug therapy: Secondary | ICD-10-CM | POA: Diagnosis not present

## 2018-10-02 DIAGNOSIS — Z5111 Encounter for antineoplastic chemotherapy: Secondary | ICD-10-CM | POA: Diagnosis not present

## 2018-10-02 NOTE — Progress Notes (Signed)
  Echocardiogram 2D Echocardiogram has been performed.  Kristi [name] 10/02/2018, 2:46 PM

## 2018-10-02 NOTE — Progress Notes (Signed)
Oncology: Dr. Lindi Adie  43 y.o. with history of breast cancer presents was referred by Dr. Lindi Adie for evaluation of dyspnea/chest pain/tachycardia.  She was diagnosed with left breast cancer in 6/19, ER+/PR+/HER2 equivocal.  In 6/19, she started Adriamycin/Cytoxan and has completed 4 cycles.  This was followed by abraxane. She had bilateral mastectomies with axillary node dissection in 12/19.  She is now getting radiation.    She has mild fatigue but otherwise has been doing fine.  No chest pain or dyspnea.      PMH: 1. Endometriosis.  2. Breast cancer: She was diagnosed with left breast cancer in 6/19, ER+/PR+/HER2 equivocal.  In 6/19, she started Adriamycin/Cytoxan and has completed 4 cycles.  She is now getting paclitaxel for 12 cycles.  She will need surgery eventually.  - Echo (6/19): EF 60-65%.  - Echo (9/19): EF 45-36%, normal diastolic function, normal RV (no change from 6/19).  - Echo (3/20): EF 55-60%, normal RV size and systolic function, GLS -46.8%.  3. Palpitations: Zio patch (9/19) with average HR 94, no significant arrhythmias.   FH: Grandfather with DVT.  No premature CAD or CHF.   Social History   Socioeconomic History  . Marital status: Single    Spouse name: Not on file  . Number of children: Not on file  . Years of education: Not on file  . Highest education level: Not on file  Occupational History  . Not on file  Social Needs  . Financial resource strain: Not on file  . Food insecurity:    Worry: Not on file    Inability: Not on file  . Transportation needs:    Medical: No    Non-medical: No  Tobacco Use  . Smoking status: Never Smoker  . Smokeless tobacco: Never Used  Substance and Sexual Activity  . Alcohol use: Yes    Comment: rare  . Drug use: Never  . Sexual activity: Not on file  Lifestyle  . Physical activity:    Days per week: Not on file    Minutes per session: Not on file  . Stress: Not on file  Relationships  . Social connections:   Talks on phone: Not on file    Gets together: Not on file    Attends religious service: Not on file    Active member of club or organization: Not on file    Attends meetings of clubs or organizations: Not on file    Relationship status: Not on file  . Intimate partner violence:    Fear of current or ex partner: No    Emotionally abused: No    Physically abused: No    Forced sexual activity: No  Other Topics Concern  . Not on file  Social History Narrative  . Not on file   ROS: All systems reviewed and negative except as per HPI.   Current Outpatient Medications  Medication Sig Dispense Refill  . baclofen (LIORESAL) 10 MG tablet Take by mouth.    Marland Kitchen buPROPion (WELLBUTRIN) 75 MG tablet Take 75 mg by mouth 2 (two) times daily.    Marland Kitchen eletriptan (RELPAX) 40 MG tablet Take 40 mg by mouth every 2 (two) hours as needed for migraine (max 2 doses per 24 hrs.).   3  . EMGALITY 120 MG/ML SOAJ Inject 120 mg into the skin every 30 (thirty) days.     . fexofenadine (ALLEGRA) 180 MG tablet Take 180 mg by mouth daily.    Marland Kitchen gabapentin (NEURONTIN) 300 MG capsule  Take 1 capsule (300 mg total) by mouth at bedtime. 90 capsule 3  . Goserelin Acetate (ZOLADEX [place]) Inject 1 Dose into the skin every 30 (thirty) days.     Marland Kitchen LORazepam (ATIVAN) 1 MG tablet Take 1 tablet (1 mg total) by mouth 3 (three) times daily as needed (agitation/restlessness). 15 tablet 0  . Melatonin 5 MG TABS Take by mouth at bedtime.    . pantoprazole (PROTONIX) 40 MG tablet Take 1 tablet (40 mg total) by mouth 2 (two) times daily. 60 tablet 0  . Soft Lens Products (REWETTING DROPS) SOLN Place 1 drop into both eyes 3 (three) times daily as needed (contact lenses.).     Current Facility-Administered Medications  Medication Dose Route Frequency Provider Last Rate Last Dose  . 0.9 %  sodium chloride infusion  500 mL Intravenous Once Milus Banister, MD       Facility-Administered Medications Ordered in Other Encounters  Medication Dose  Route Frequency Provider Last Rate Last Dose  . sodium chloride flush (NS) 0.9 % injection 10 mL  10 mL Intravenous PRN Nicholas Lose, MD   10 mL at 04/13/18 1529   BP 118/78   Pulse 100   Wt 60.1 kg (132 lb 8 oz)   LMP  (LMP Unknown) Comment: doesnt have periods on this injection  SpO2 97%   BMI 22.05 kg/m  General: NAD Neck: No JVD, no thyromegaly or thyroid nodule.  Lungs: Clear to auscultation bilaterally with normal respiratory effort. CV: Nondisplaced PMI.  Heart regular S1/S2, no S3/S4, no murmur.  No peripheral edema.  No carotid bruit.  Normal pedal pulses.  Abdomen: Soft, nontender, no hepatosplenomegaly, no distention.  Skin: Intact without lesions or rashes.  Neurologic: Alert and oriented x 3.  Psych: Normal affect. Extremities: No clubbing or cyanosis.  HEENT: Normal.   Assessment/Plan: 1. Palpitations: Resolved.  Zio patch unremarkable.  Her resting HR is generally mildly elevated in 90s and rises > 100 with stress.  She may have mild POTS.   2. Breast cancer: She has completed Adriamycin-based treatment.  Echo in 9/19 was reviewed and appeared normal, no change from pre-op echo. She had an echo today to look for any delayed signs of toxicity, appears normal.   Followup prn.   Loralie Champagne 10/02/2018

## 2018-10-02 NOTE — Patient Instructions (Signed)
Congratulations you have graduated from our clinic!!! Please call our office if you ever need [place] again

## 2018-10-03 ENCOUNTER — Other Ambulatory Visit: Payer: Self-pay

## 2018-10-03 ENCOUNTER — Ambulatory Visit
Admission: RE | Admit: 2018-10-03 | Discharge: 2018-10-03 | Disposition: A | Discharge status: Home / Self Care | Payer: Commercial Managed Care - PPO | Source: Ambulatory Visit | Attending: Radiation Oncology | Admitting: Radiation Oncology

## 2018-10-03 DIAGNOSIS — Z5111 Encounter for antineoplastic chemotherapy: Secondary | ICD-10-CM | POA: Diagnosis not present

## 2018-10-04 ENCOUNTER — Other Ambulatory Visit: Payer: Self-pay

## 2018-10-04 ENCOUNTER — Ambulatory Visit
Admission: RE | Admit: 2018-10-04 | Discharge: 2018-10-04 | Disposition: A | Discharge status: Home / Self Care | Payer: Commercial Managed Care - PPO | Source: Ambulatory Visit | Attending: Radiation Oncology | Admitting: Radiation Oncology

## 2018-10-04 DIAGNOSIS — Z5111 Encounter for antineoplastic chemotherapy: Secondary | ICD-10-CM | POA: Diagnosis not present

## 2018-10-05 ENCOUNTER — Ambulatory Visit
Admission: RE | Admit: 2018-10-05 | Discharge: 2018-10-05 | Disposition: A | Discharge status: Home / Self Care | Payer: Commercial Managed Care - PPO | Source: Ambulatory Visit | Attending: Radiation Oncology | Admitting: Radiation Oncology

## 2018-10-05 ENCOUNTER — Other Ambulatory Visit: Payer: Self-pay

## 2018-10-05 ENCOUNTER — Inpatient Hospital Stay: Payer: Commercial Managed Care - PPO | Attending: Hematology and Oncology

## 2018-10-05 DIAGNOSIS — Z5111 Encounter for antineoplastic chemotherapy: Secondary | ICD-10-CM | POA: Diagnosis not present

## 2018-10-05 MED ORDER — GOSERELIN ACETATE 3.6 MG ~~LOC~~ IMPL
DRUG_IMPLANT | SUBCUTANEOUS | Status: AC
  Filled 2018-10-05: qty 3.6

## 2018-10-05 MED ORDER — GOSERELIN ACETATE 3.6 MG ~~LOC~~ IMPL
3.6000 mg | DRUG_IMPLANT | Freq: Once | SUBCUTANEOUS | Status: AC
  Administered 2018-10-05: 3.6 mg via SUBCUTANEOUS

## 2018-10-05 NOTE — Patient Instructions (Signed)
Goserelin injection What is this medicine? GOSERELIN (GOE se rel in) is similar to a hormone found in the body. It lowers the amount of sex hormones that the body makes. Men will have lower testosterone levels and women will have lower estrogen levels while taking this medicine. In men, this medicine is used to treat prostate cancer; the injection is either given once per month or once every 12 weeks. A once per month injection (only) is used to treat women with endometriosis, dysfunctional uterine bleeding, or advanced breast cancer. This medicine may be used for other purposes; ask your health care provider or pharmacist if you have questions. COMMON BRAND NAME(S): Zoladex What should I tell my health care provider before I take this medicine? They need to know if you have any of these conditions (some only apply to women): -diabetes -heart disease or previous heart attack -high blood pressure -high cholesterol -kidney disease -osteoporosis or low bone density -problems passing urine -spinal cord injury -stroke -tobacco smoker -an unusual or allergic reaction to goserelin, hormone therapy, other medicines, foods, dyes, or preservatives -pregnant or trying to get pregnant -breast-feeding How should I use this medicine? This medicine is for injection under the skin. It is given by a health care professional in a hospital or clinic setting. Men receive this injection once every 4 weeks or once every 12 weeks. Women will only receive the once every 4 weeks injection. Talk to your pediatrician regarding the use of this medicine in children. Special care may be needed. Overdosage: If you think you have taken too much of this medicine contact a poison control center or emergency room at once. NOTE: This medicine is only for you. Do not share this medicine with others. What if I miss a dose? It is important not to miss your dose. Call your doctor or health care professional if you are unable to  keep an appointment. What may interact with this medicine? -female hormones like estrogen -herbal or dietary supplements like black cohosh, chasteberry, or DHEA -female hormones like testosterone -prasterone This list may not describe all possible interactions. Give your health care provider a list of all the medicines, herbs, non-prescription drugs, or dietary supplements you use. Also tell them if you smoke, drink alcohol, or use illegal drugs. Some items may interact with your medicine. What should I watch for while using this medicine? Visit your doctor or health care professional for regular checks on your progress. Your symptoms may appear to get worse during the first weeks of this therapy. Tell your doctor or healthcare professional if your symptoms do not start to get better or if they get worse after this time. Your bones may get weaker if you take this medicine for a long time. If you smoke or frequently drink alcohol you may increase your risk of bone loss. A family history of osteoporosis, chronic use of drugs for seizures (convulsions), or corticosteroids can also increase your risk of bone loss. Talk to your doctor about how to keep your bones strong. This medicine should stop regular monthly menstration in women. Tell your doctor if you continue to menstrate. Women should not become pregnant while taking this medicine or for 12 weeks after stopping this medicine. Women should inform their doctor if they wish to become pregnant or think they might be pregnant. There is a potential for serious side effects to an unborn child. Talk to your health care professional or pharmacist for more information. Do not breast-feed an infant while taking   this medicine. Men should inform their doctors if they wish to father a child. This medicine may lower sperm counts. Talk to your health care professional or pharmacist for more information. What side effects may I notice from receiving this  medicine? Side effects that you should report to your doctor or health care professional as soon as possible: -allergic reactions like skin rash, itching or hives, swelling of the face, lips, or tongue -bone pain -breathing problems -changes in vision -chest pain -feeling faint or lightheaded, falls -fever, chills -pain, swelling, warmth in the leg -pain, tingling, numbness in the hands or feet -signs and symptoms of low blood pressure like dizziness; feeling faint or lightheaded, falls; unusually weak or tired -stomach pain -swelling of the ankles, feet, hands -trouble passing urine or change in the amount of urine -unusually high or low blood pressure -unusually weak or tired Side effects that usually do not require medical attention (report to your doctor or health care professional if they continue or are bothersome): -change in sex drive or performance -changes in breast size in both males and females -changes in emotions or moods -headache -hot flashes -irritation at site where injected -loss of appetite -skin problems like acne, dry skin -vaginal dryness This list may not describe all possible side effects. Call your doctor for medical advice about side effects. You may report side effects to FDA at 1-800-FDA-1088. Where should I keep my medicine? This drug is given in a hospital or clinic and will not be stored at home. NOTE: This sheet is a summary. It may not cover all possible information. If you have questions about this medicine, talk to your doctor, pharmacist, or health care provider.  2019 Elsevier/Gold Standard (2013-09-17 11:10:35)  

## 2018-10-08 ENCOUNTER — Ambulatory Visit
Admission: RE | Admit: 2018-10-08 | Discharge: 2018-10-08 | Disposition: A | Discharge status: Home / Self Care | Payer: Commercial Managed Care - PPO | Source: Ambulatory Visit | Attending: Radiation Oncology | Admitting: Radiation Oncology

## 2018-10-08 ENCOUNTER — Other Ambulatory Visit: Payer: Self-pay

## 2018-10-08 DIAGNOSIS — Z5111 Encounter for antineoplastic chemotherapy: Secondary | ICD-10-CM | POA: Diagnosis not present

## 2018-10-09 ENCOUNTER — Other Ambulatory Visit: Payer: Self-pay

## 2018-10-09 ENCOUNTER — Ambulatory Visit
Admission: RE | Admit: 2018-10-09 | Discharge: 2018-10-09 | Disposition: A | Discharge status: Home / Self Care | Payer: Commercial Managed Care - PPO | Source: Ambulatory Visit | Attending: Radiation Oncology | Admitting: Radiation Oncology

## 2018-10-09 DIAGNOSIS — Z5111 Encounter for antineoplastic chemotherapy: Secondary | ICD-10-CM | POA: Diagnosis not present

## 2018-10-10 ENCOUNTER — Other Ambulatory Visit: Payer: Self-pay

## 2018-10-10 ENCOUNTER — Ambulatory Visit: Payer: Commercial Managed Care - PPO

## 2018-10-10 ENCOUNTER — Ambulatory Visit
Admission: RE | Admit: 2018-10-10 | Discharge: 2018-10-10 | Disposition: A | Discharge status: Home / Self Care | Payer: Commercial Managed Care - PPO | Source: Ambulatory Visit | Attending: Radiation Oncology | Admitting: Radiation Oncology

## 2018-10-10 DIAGNOSIS — M25612 Stiffness of left shoulder, not elsewhere classified: Secondary | ICD-10-CM

## 2018-10-10 DIAGNOSIS — Z483 Aftercare following surgery for neoplasm: Secondary | ICD-10-CM

## 2018-10-10 DIAGNOSIS — M25611 Stiffness of right shoulder, not elsewhere classified: Secondary | ICD-10-CM

## 2018-10-10 DIAGNOSIS — M6281 Muscle weakness (generalized): Secondary | ICD-10-CM

## 2018-10-10 DIAGNOSIS — M25512 Pain in left shoulder: Secondary | ICD-10-CM

## 2018-10-10 DIAGNOSIS — Z5111 Encounter for antineoplastic chemotherapy: Secondary | ICD-10-CM | POA: Diagnosis not present

## 2018-10-10 NOTE — Therapy (Addendum)
[place], [place], [address] Phone: [phone]   Fax:  [phone]  Physical Therapy Treatment  Patient Details  Name: Kristi [name] MRN: [id number] Date of Birth: 31-Aug-1975 Referring Provider (PT): Dr. Donne Hazel   Encounter Date: 10/10/2018  PT End of Session - 10/10/18 1519    Visit Number  13    Number of Visits  21    Date for PT Re-Evaluation  11/21/18   clinic will be closed first 2 weeks due to COVID   PT Start Time  1434    PT Stop Time  1519    PT Time Calculation (min)  45 min    Activity Tolerance  Patient tolerated treatment well    Behavior During Therapy  [facility] for tasks assessed/performed       Past Medical History:  Diagnosis Date  . Breast cancer, left ([place]) 12/2017  . Endometriosis   . Family history of breast cancer   . Family history of ovarian cancer   . Family history of pancreatic cancer   . Family history of prostate cancer   . History of chemotherapy    finished chemo 06/18/2018  . Migraines   . PONV (postoperative nausea and vomiting)   . Wears contact lenses     Past Surgical History:  Procedure Laterality Date  . AXILLARY LYMPH NODE DISSECTION Left 07/24/2018  . AXILLARY LYMPH NODE DISSECTION Left 07/24/2018   Procedure: LEFT AXILLARY LYMPH NODE DISSECTION;  Surgeon: Rolm Bookbinder, MD;  Location: [facility];  Service: General;  Laterality: Left;  GENERAL WITH PEC BLOCK  . BREAST RECONSTRUCTION WITH PLACEMENT OF TISSUE EXPANDER AND ALLODERM Bilateral 07/02/2018   Procedure: BILATERAL BREAST RECONSTRUCTION WITH PLACEMENT OF TISSUE EXPANDER AND ACELLULAR DERMIS;  Surgeon: Irene Limbo, MD;  Location: [facility];  Service: Plastics;  Laterality: Bilateral;  . COLONOSCOPY    . MASTECTOMY WITH RADIOACTIVE SEED GUIDED EXCISION AND AXILLARY SENTINEL LYMPH NODE BIOPSY Bilateral 07/02/2018   Procedure: BILATERAL NIPPLE SPARING MASTECTOMIES  WITH LEFT AXILLARY SENTINEL LYMPH NODE BIOPSY AND LEFT SEED GUIDED NODE EXCISION;  Surgeon: Rolm Bookbinder, MD;  Location: [facility];  Service: General;  Laterality: Bilateral;  . NASAL POLYP EXCISION    . OVARIAN CYST REMOVAL    . PORTA CATH REMOVAL Right 07/02/2018   Procedure: PORTA CATH REMOVAL;  Surgeon: Rolm Bookbinder, MD;  Location: [facility];  Service: General;  Laterality: Right;  . PORTACATH PLACEMENT Right 01/15/2018   Procedure: INSERTION PORT-A-CATH WITH [place];  Surgeon: Rolm Bookbinder, MD;  Location: [facility];  Service: General;  Laterality: Right;  . WISDOM TOOTH EXTRACTION      There were no vitals filed for this visit.                                 PT Long Term Goals - 10/10/18 1440      PT LONG TERM GOAL #1   Title  Pt to demonstrate 160 degrees of L shoulder flexion to allow her to reach overhead and to allow pt to get in position for radiation    Baseline  95, 09/06/18- 160; 154 degrees-10/10/18    Status  On-going      PT LONG TERM GOAL #2   Title  Pt to demonstraste 160 degrees of L shoulder abduction to allow her to reach out to the side and to allow  pt to get in position for radiation    Baseline  62, 09/06/18- 140; 171 degrees - 10/10/18    Status  Achieved      PT LONG TERM GOAL #3   Title  Pt to be independent in a home exercise program for continued strengthening and stretching    Baseline  Pt independent in HEP thus far and was progressed with this today-10/10/18    Status  Partially Met      PT LONG TERM GOAL #4   Title  Pt to obtain sleeve and glove for prophylactic use when flying to decrease risk of lymphedema    Baseline  09/06/18- it has been ordered; pt has now received this- 10/10/18    Status  Achieved      PT LONG TERM GOAL #5   Title  Pt to report a 50% improvement in pain when moving left shoulder to allow improved comfort    Baseline  09/06/18- 75% ;  80%-10/10/18    Status  Achieved            Plan - 10/10/18 1651    Clinical Impression Statement  Pt is doing well and has met 2/5 goals. Advanced her HEP today to include 3 way raises and verbally reviewed other HEP and importance of contuing to stretch often throughout day as she is currently undergoing radiation. Pts skin is beginning to increase in redness due to radiation. Pt on hold over next 2 weeks as clinic will be closed due to [place]. Answered all questions regarding self progression during this time and reassessed goals. Pt verbalized understanding.     Rehab Potential  Good    Clinical Impairments Affecting Rehab Potential  pt to begin radiation soon    PT Frequency  1x / week    PT Duration  4 weeks    PT Treatment/Interventions  ADLs/Self Care Home Management;Therapeutic activities;Therapeutic exercise;Manual techniques;Patient/family education;Scar mobilization;Passive range of motion;Joint Manipulations    PT Next Visit Plan  Reassess pt next as she will have finished radiation.     Consulted and Agree with Plan of Care  Patient       Patient will benefit from skilled therapeutic intervention in order to improve the following deficits and impairments:  Pain, Increased fascial restricitons, Decreased scar mobility, Decreased range of motion, Decreased strength, Impaired UE functional use, Decreased knowledge of precautions  Visit Diagnosis: Stiffness of left shoulder, not elsewhere classified  Acute pain of left shoulder  Muscle weakness (generalized)  Stiffness of right shoulder, not elsewhere classified  Aftercare following surgery for neoplasm     Problem List Patient Active Problem List   Diagnosis Date Noted  . Breast cancer, left ([place]) 07/02/2018  . Chemotherapy induced neutropenia ([place]) 06/06/2018  . Port-A-Cath in place 04/20/2018  . Genetic testing 01/29/2018  . Family history of breast cancer   . Family history of prostate cancer   . Family  history of ovarian cancer   . Family history of pancreatic cancer   . Malignant neoplasm of upper-outer quadrant of left breast in female, estrogen receptor positive ([place]) 01/04/2018    Otelia Limes, PTA 10/10/2018, 5:08 PM  [address] [place], [place], [address] Phone: [phone]   Fax:  [phone]  Name: Kristi [name] MRN: [id number] Date of Birth: [birth year]  PHYSICAL THERAPY DISCHARGE SUMMARY  Visits from Start of Care: 13  Current functional level related to goals / functional outcomes: See above  Remaining deficits: Still limited shoulder ROM   Education / Equipment: HEP  Plan: Patient agrees to discharge.  Patient goals were partially met. Patient is being discharged due to being pleased with the current functional level.  ?????    Allyson Sabal Desha, [place] 12/27/18 9:21 AM

## 2018-10-10 NOTE — Patient Instructions (Signed)

## 2018-10-11 ENCOUNTER — Ambulatory Visit
Admission: RE | Admit: 2018-10-11 | Discharge: 2018-10-11 | Disposition: A | Discharge status: Home / Self Care | Payer: Commercial Managed Care - PPO | Source: Ambulatory Visit | Attending: Radiation Oncology | Admitting: Radiation Oncology

## 2018-10-11 DIAGNOSIS — Z5111 Encounter for antineoplastic chemotherapy: Secondary | ICD-10-CM | POA: Diagnosis not present

## 2018-10-12 ENCOUNTER — Ambulatory Visit
Admission: RE | Admit: 2018-10-12 | Discharge: 2018-10-12 | Disposition: A | Discharge status: Home / Self Care | Payer: Commercial Managed Care - PPO | Source: Ambulatory Visit | Attending: Radiation Oncology | Admitting: Radiation Oncology

## 2018-10-12 ENCOUNTER — Other Ambulatory Visit: Payer: Self-pay

## 2018-10-12 DIAGNOSIS — Z5111 Encounter for antineoplastic chemotherapy: Secondary | ICD-10-CM | POA: Diagnosis not present

## 2018-10-15 ENCOUNTER — Ambulatory Visit
Admission: RE | Admit: 2018-10-15 | Discharge: 2018-10-15 | Disposition: A | Discharge status: Home / Self Care | Payer: Commercial Managed Care - PPO | Source: Ambulatory Visit | Attending: Radiation Oncology | Admitting: Radiation Oncology

## 2018-10-15 ENCOUNTER — Other Ambulatory Visit: Payer: Self-pay

## 2018-10-15 DIAGNOSIS — Z5111 Encounter for antineoplastic chemotherapy: Secondary | ICD-10-CM | POA: Diagnosis not present

## 2018-10-16 ENCOUNTER — Other Ambulatory Visit: Payer: Self-pay

## 2018-10-16 ENCOUNTER — Ambulatory Visit
Admission: RE | Admit: 2018-10-16 | Discharge: 2018-10-16 | Disposition: A | Discharge status: Home / Self Care | Payer: Commercial Managed Care - PPO | Source: Ambulatory Visit | Attending: Radiation Oncology | Admitting: Radiation Oncology

## 2018-10-16 DIAGNOSIS — Z5111 Encounter for antineoplastic chemotherapy: Secondary | ICD-10-CM | POA: Diagnosis not present

## 2018-10-17 ENCOUNTER — Other Ambulatory Visit: Payer: Self-pay

## 2018-10-17 ENCOUNTER — Encounter: Payer: Commercial Managed Care - PPO | Admitting: Physical Therapy

## 2018-10-17 ENCOUNTER — Ambulatory Visit
Admission: RE | Admit: 2018-10-17 | Discharge: 2018-10-17 | Disposition: A | Discharge status: Home / Self Care | Payer: Commercial Managed Care - PPO | Source: Ambulatory Visit | Attending: Radiation Oncology | Admitting: Radiation Oncology

## 2018-10-17 DIAGNOSIS — Z5111 Encounter for antineoplastic chemotherapy: Secondary | ICD-10-CM | POA: Diagnosis not present

## 2018-10-18 ENCOUNTER — Ambulatory Visit
Admission: RE | Admit: 2018-10-18 | Discharge: 2018-10-18 | Disposition: A | Discharge status: Home / Self Care | Payer: Commercial Managed Care - PPO | Source: Ambulatory Visit | Attending: Radiation Oncology | Admitting: Radiation Oncology

## 2018-10-18 ENCOUNTER — Other Ambulatory Visit: Payer: Self-pay

## 2018-10-18 DIAGNOSIS — Z5111 Encounter for antineoplastic chemotherapy: Secondary | ICD-10-CM | POA: Diagnosis not present

## 2018-10-19 ENCOUNTER — Other Ambulatory Visit: Payer: Self-pay

## 2018-10-19 ENCOUNTER — Ambulatory Visit
Admission: RE | Admit: 2018-10-19 | Discharge: 2018-10-19 | Disposition: A | Discharge status: Home / Self Care | Payer: Commercial Managed Care - PPO | Source: Ambulatory Visit | Attending: Radiation Oncology | Admitting: Radiation Oncology

## 2018-10-19 DIAGNOSIS — Z5111 Encounter for antineoplastic chemotherapy: Secondary | ICD-10-CM | POA: Diagnosis not present

## 2018-10-22 ENCOUNTER — Encounter: Payer: Self-pay | Admitting: Radiation Oncology

## 2018-10-22 ENCOUNTER — Ambulatory Visit
Admission: RE | Admit: 2018-10-22 | Discharge: 2018-10-22 | Disposition: A | Discharge status: Home / Self Care | Payer: Commercial Managed Care - PPO | Source: Ambulatory Visit | Attending: Radiation Oncology | Admitting: Radiation Oncology

## 2018-10-22 ENCOUNTER — Other Ambulatory Visit: Payer: Self-pay

## 2018-10-22 DIAGNOSIS — Z5111 Encounter for antineoplastic chemotherapy: Secondary | ICD-10-CM | POA: Diagnosis not present

## 2018-10-23 ENCOUNTER — Inpatient Hospital Stay (HOSPITAL_BASED_OUTPATIENT_CLINIC_OR_DEPARTMENT_OTHER): Payer: Commercial Managed Care - PPO | Admitting: Hematology and Oncology

## 2018-10-23 DIAGNOSIS — C50412 Malignant neoplasm of upper-outer quadrant of left female breast: Secondary | ICD-10-CM

## 2018-10-23 DIAGNOSIS — Z170 Estrogen receptor positive status [ER+]: Secondary | ICD-10-CM

## 2018-10-23 IMAGING — CT CT ANGIO CHEST
2 of 6 series · 18 of 36 positions shown · IV contrast (iopamidol)
Comparison: Plain films 01/15/2018.  CT 01/12/2018.

CLINICAL DATA: Shortness of breath and chest pressure. Evaluate for
pulmonary embolism. History of breast cancer, on chemotherapy.

EXAM:
CT ANGIOGRAPHY CHEST WITH CONTRAST
TECHNIQUE: Multidetector CT imaging of the chest was performed using the
standard protocol during bolus administration of intravenous
contrast. Multiplanar CT image reconstructions and MIPs were
obtained to evaluate the vascular anatomy.
CONTRAST:  100mL R1B9A4-DYZ IOPAMIDOL (R1B9A4-DYZ) INJECTION 76%

[Series 7: pe thins · axial · 0.59mm/px · z∈[+1151,+1377]mm · 17 of 359 slices shown]
[im 18/359  lung]
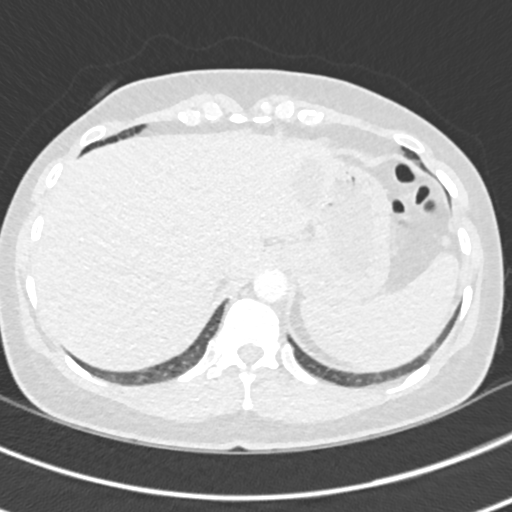
[im 36/359  mediastinal]
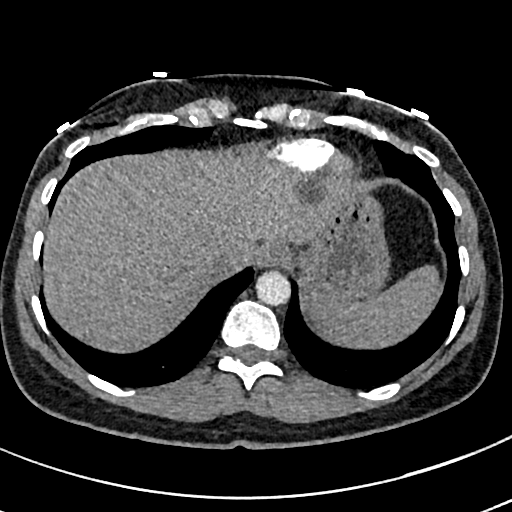
[im 54/359  lung]
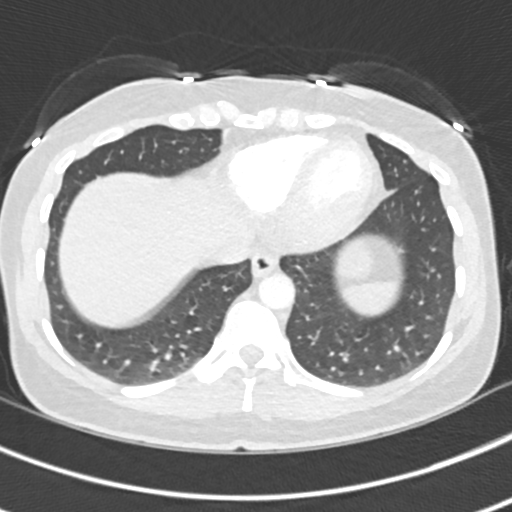
[im 72/359  mediastinal]
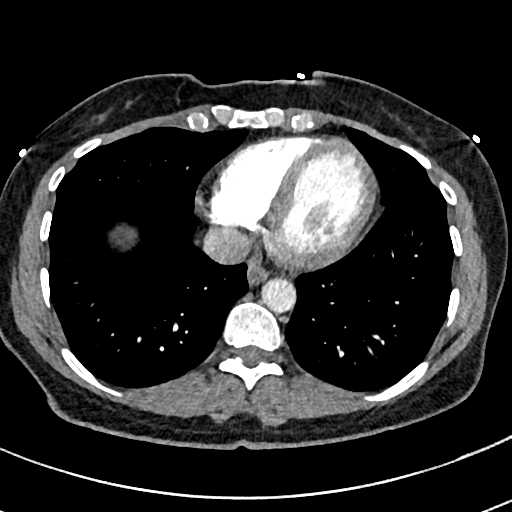
[im 108/359  lung]
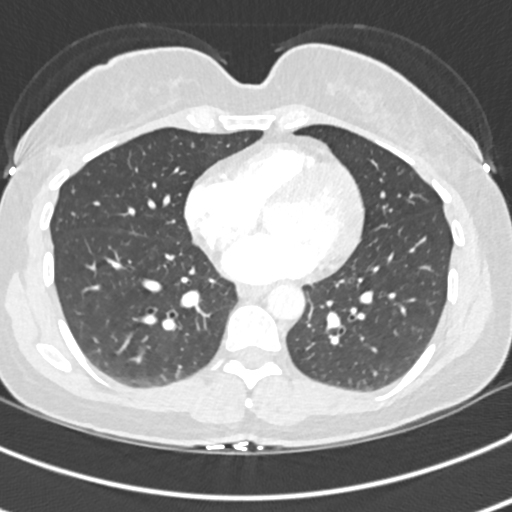
[im 126/359  mediastinal]
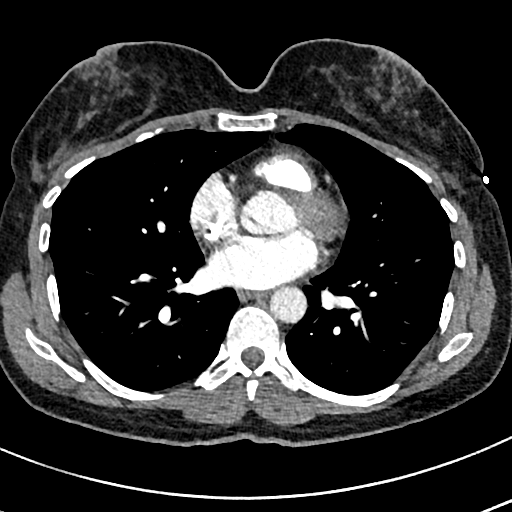
[im 144/359  lung]
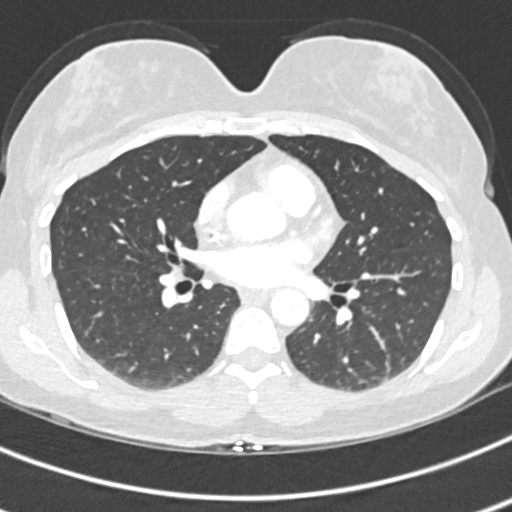
[im 162/359  mediastinal]
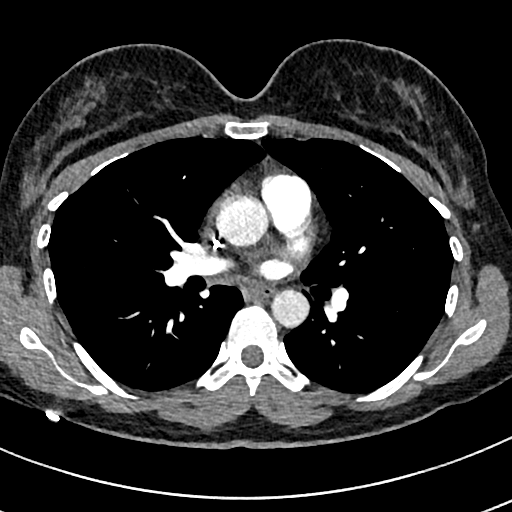
[im 180/359  lung]
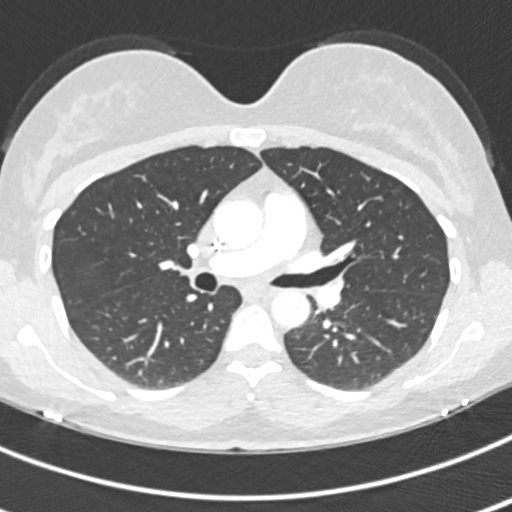
[im 197/359  mediastinal]
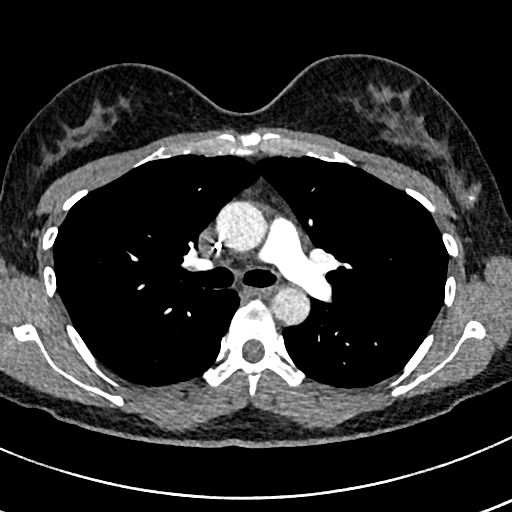
[im 215/359  lung]
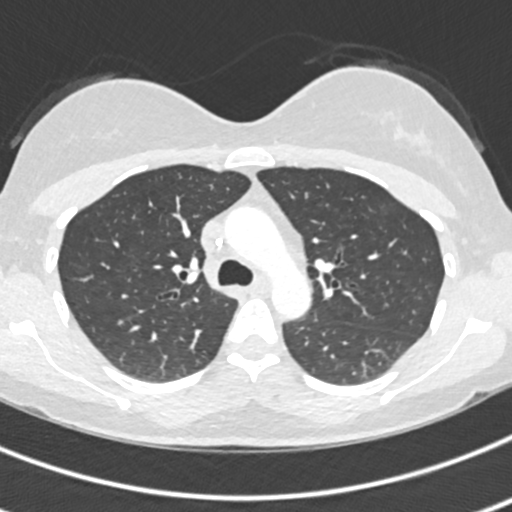
[im 233/359  mediastinal]
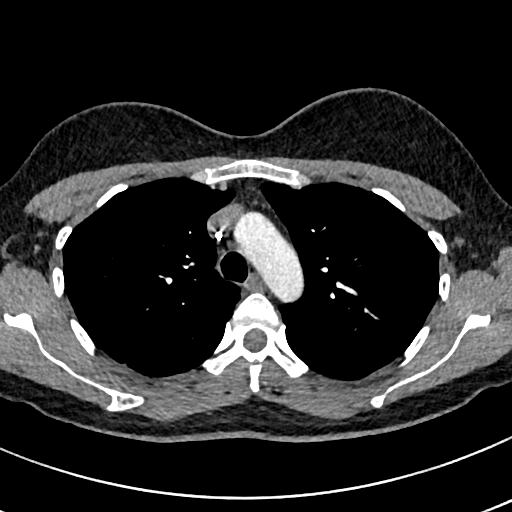
[im 251/359  lung]
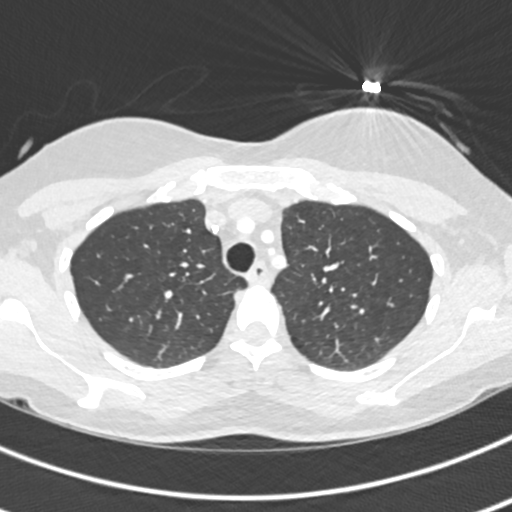
[im 287/359  mediastinal]
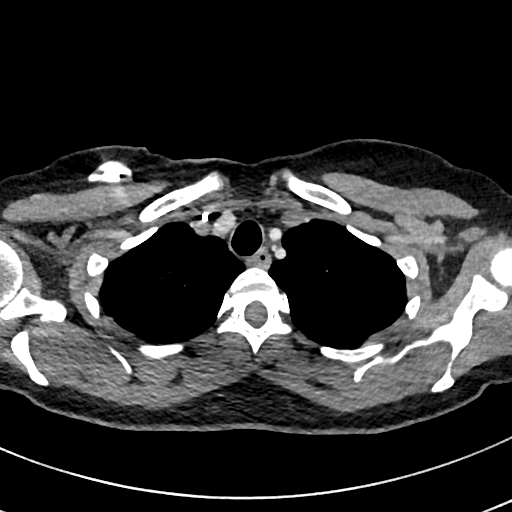
[im 305/359  lung]
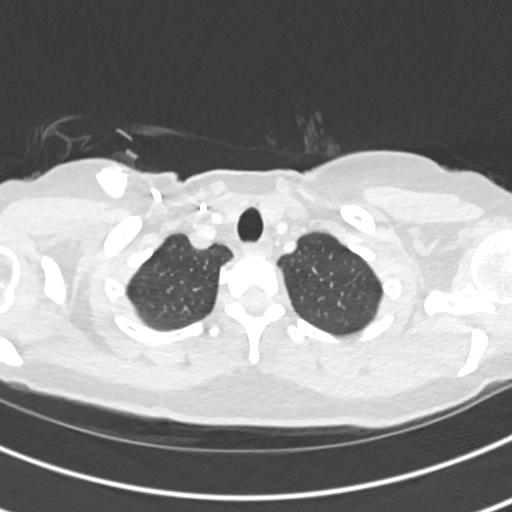
[im 323/359  mediastinal]
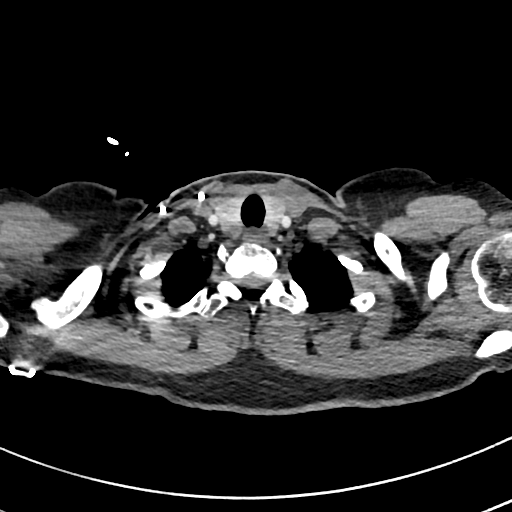
[im 341/359  lung]
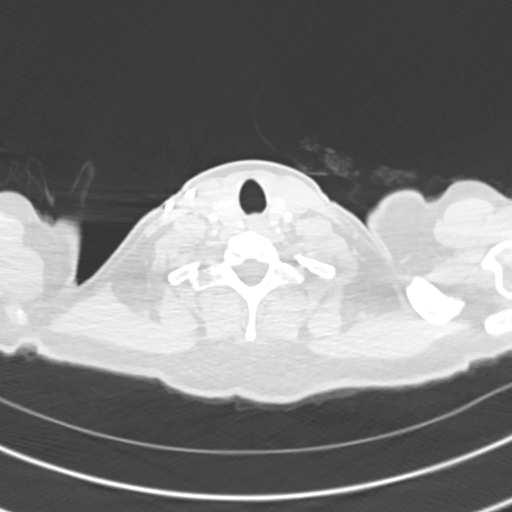

[Series 9: pe 2mm cor · coronal · 0.52mm/px · 1 of 119 slices shown]
[im 60/119  mediastinal]
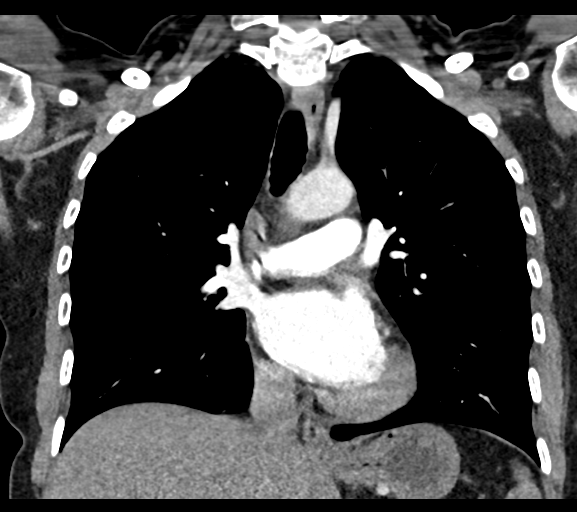

[18 of 36 positions shown; findings below may reference images not displayed]

FINDINGS: Cardiovascular: The quality of this exam for evaluation of pulmonary
embolism is good. No evidence of pulmonary embolism.

Right Port-A-Cath which terminates at the high right atrium. Normal
aortic caliber. Normal heart size, without pericardial effusion.

Mediastinum/Nodes: No supraclavicular adenopathy. Resolution of
previously described left axillary 1.0 cm node. No mediastinal or
hilar adenopathy. No internal mammary adenopathy.

Lungs/Pleura: No pleural fluid.  Clear lungs.

Upper Abdomen: Left hepatic lobe cysts. The indeterminate high left
hepatic lobe lesion is not readily apparent, possibly due to
differences in bolus timing. Normal imaged portions of the spleen,
stomach.

Musculoskeletal: Left breast primary is less well-defined today.
Residual soft tissue fullness measures 1.6 cm on image 57/6 versus
2.2 cm on the prior. No acute osseous abnormality.

Review of the MIP images confirms the above findings.
IMPRESSION: 1. No evidence of pulmonary embolism. No explanation for patient's
symptoms.
2. Response to therapy of left breast primary and left axillary
adenopathy. No new or progressive disease.
3. Previously described indeterminate high left hepatic lobe lesion
is not well evaluated or visualized today.

## 2018-10-23 MED ORDER — ANASTROZOLE 1 MG PO TABS: 1.0000 mg | ORAL_TABLET | Freq: Every day | ORAL | 3 refills | Status: DC

## 2018-10-23 NOTE — Progress Notes (Signed)
HEMATOLOGY-ONCOLOGY TELEPHONE VISIT PROGRESS NOTE  I connected with Kristi [name] on 10/23/2018 at  3:30 PM EDT by telephone and verified that I am speaking with the correct person using two identifiers.  I discussed the limitations, risks, security and privacy concerns of performing an evaluation and management service by telephone and the availability of in person appointments.  I also discussed with the patient that there may be a patient responsible charge related to this service. The patient expressed understanding and agreed to proceed.   History of Present Illness: Kristi [name] is a 43 y.o. female with above-mentioned history of left breast cancer treated with neoadjuvant chemotherapy followed by lumpectomy and radiation, which was completed yesterday. She presents today to discuss further treatment.     Malignant neoplasm of upper-outer quadrant of left breast in female, estrogen receptor positive ([place])   01/02/2018 Initial Diagnosis    Palpable lump left breast, mammogram: Left breast UOQ 2.3 cm mass, axilla has several enlarged lymph nodes; 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    01/04/2018 Cancer Staging    Staging form: Breast, AJCC 8th Edition - Clinical: Stage IIB (cT2, cN1, cM0, G3, ER+, PR+, HER2: Equivocal) - Signed by Nicholas Lose, MD on 01/04/2018    01/16/2018 - 06/18/2018 Neo-Adjuvant Chemotherapy    Dose dense Adriamycin and Cytoxan x4 followed by Taxol, switched to Abraxane with cycle 2 after Taxol reaction with palpitations weekly x12    01/19/2018 Genetic Testing    STK11 c.608C>T VUS identified on the Multicancer panel.  The Multi-Gene Panel offered by [facility] includes sequencing and/or deletion duplication testing of the following 83 genes: ALK, APC, ATM, AXIN2,BAP1,  BARD1, BLM, BMPR1A, BRCA1, BRCA2, BRIP1, CASR, CDC73, CDH1, CDK4,  CDKN1B, CDKN1C, CDKN2A (p14ARF), CDKN2A (p16INK4a), CEBPA, CHEK2, CTNNA1, DICER1, DIS3L2, EGFR (c.2369C>T, p.Thr790Met variant only), EPCAM (Deletion/duplication testing only), FH, FLCN, GATA2, GPC3, GREM1 (Promoter region deletion/duplication testing only), HOXB13 (c.251G>A, p.Gly84Glu), HRAS, KIT, MAX, MEN1, MET, MITF (c.952G>A, p.Glu318Lys variant only), MLH1, MSH2, MSH3, MSH6, MUTYH, NBN, NF1, NF2, NTHL1, PALB2, PDGFRA, PHOX2B, PMS2, POLD1, POLE, POT1, PRKAR1A, PTCH1, PTEN, RAD50, RAD51C, RAD51D, RB1, RECQL4, RET, RUNX1, SDHAF2, SDHA (sequence changes only), SDHB, SDHC, SDHD, SMAD4, SMARCA4, SMARCB1, SMARCE1, STK11, SUFU, TERT, TERT, TMEM127, TP53, TSC1, TSC2, VHL, WRN and WT1.  The report date is January 19, 2018.    06/13/2018 Breast MRI    Significant improvement of left breast cancer previously 2.4 x 3.2 x 4 cm, currently 1.4 x 1.3 x 0.8 cm, no lymph nodes    07/02/2018 Surgery    Bilateral mastectomies: Left mastectomy: IDC grade 2, 0.9 cm, margins negative, 1/4 lymph nodes positive with extracapsular extension, margins negative ypT1b ypN1 ER 75% strong, PR 70% strong, HER-2 negative, Ki-67 60%    07/24/2018 Surgery    Left axillary lymph node dissection: 0/4 lymph nodes    09/07/2018 - 10/22/2018 Radiation Therapy    Adjuvant radiation     Observations/Objective:  Patient is recovering and doing very well from the effects of radiation. Energy levels are improving. Abdominal pain is finally subsided.  It was quite exceptional in the sense that there was no identifiable cause.   Assessment Plan:  Malignant neoplasm of upper-outer quadrant of left breast in female, estrogen receptor positive ([place]) 01/02/2018: Palpable lump left breastsince December 2018, mammogram: Left breast UOQ 2.3 cm mass, axilla has several enlarged lymph nodes; biopsy revealed grade  3 IDC with perineural invasion, lymph node also positive for IDC, ER 75%, PR 70%, Ki-67 60%, HER-2 IHC equivocal FISH: Neg.  Status post  neoadjuvant chemotherapy with dose dense Adriamycin Cytoxan x4 followed by Abraxane weekly x12  07/02/2018:Bilateral mastectomies: Left mastectomy: IDC grade 2, 0.9 cm, margins negative, 1/4 lymph nodes positive with extracapsular extension, margins negative ypT1b ypN1 ER 75% strong, PR 10% strong, HER-2 negative, Ki-67 60% 07/23/2018: Left axillary lymph node dissection: 0/4 LN Adjuvant radiation therapy 09/07/2018-10/23/2018  Recommendation: Adjuvant antiestrogen therapy with Zoladex and anastrozole We discussed the risks and benefits of anti-estrogen therapy with aromatase inhibitors. These include but not limited to insomnia, hot flashes, mood changes, vaginal dryness, bone density loss, and weight gain. We strongly believe that the benefits far outweigh the risks. Patient understands these risks and consented to starting treatment. Planned treatment duration is 7 years.  Consideration for clinical trial Clarene Reamer will not be given because of hold put on research --------------------------------------------------------------------------------------------------------------- Recurrent abdominal pain: Much improved  Treatment plan: Complete ovarian suppression with Zoladex and anastrozole. Patient was interested to see if she may undergo oophorectomy and hysterectomy.  I instructed her that she could discuss this with her gynecologist.  We are essentially treating her with Zoladex which is medical oophorectomy.  If she does undergo oophorectomy then we do not have to give her Zoladex treatments.  We counseled her about participating in a clinical study looking to see and mobile based application to improve compliance with antiestrogen therapy She appears to be interested.  We will send her the consent form and follow-up with Mendel Ryder by telephone.  Return to clinic monthly for injections in 3 months for survivorship care plan visit starting April 15.   I discussed the assessment and treatment plan  with the patient. The patient was provided an opportunity to ask questions and all were answered. The patient agreed with the plan and demonstrated an understanding of the instructions. The patient was advised to call back or seek an in-person evaluation if the symptoms worsen or if the condition fails to improve as anticipated.   I provided 15 minutes of non-face-to-face time during this encounter.   Rulon Eisenmenger, MD 10/23/2018     I, Molly Dorshimer, am acting as scribe for Nicholas Lose, MD.  I have reviewed the above documentation for accuracy and completeness, and I agree with the above.

## 2018-10-23 NOTE — Assessment & Plan Note (Signed)
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  Status post neoadjuvant chemotherapy with dose dense Adriamycin Cytoxan x4 followed by Abraxane weekly x12  07/02/2018:Bilateral mastectomies: Left mastectomy: IDC grade 2, 0.9 cm, margins negative, 1/4 lymph nodes positive with extracapsular extension, margins negative ypT1b ypN1 ER 75% strong, PR 10% strong, HER-2 negative, Ki-67 60% 07/23/2018: Left axillary lymph node dissection: 0/4 LN Adjuvant radiation therapy 09/07/2018-10/23/2018  Recommendation: Adjuvant antiestrogen therapy with tamoxifen 20 mg daily x10 years.  We discussed the benefit of complete ovarian suppression with Zoladex and aromatase inhibitor as being superior to tamoxifen.  However because of coronavirus, we would prefer her not to have to come to the hospital for her monthly injections.  We may switch her to complete ovarian suppression once the current crisis has improved. Consideration for clinical trial Clarene Reamer will not be given because of hold put on research --------------------------------------------------------------------------------------------------------------- Recurrent abdominal pain:   Tamoxifen counseling:We discussed the risks and benefits of tamoxifen. These include but not limited to insomnia, hot flashes, mood changes, vaginal dryness, and weight gain. Although rare, serious side effects including endometrial cancer, risk of blood clots were also discussed. We strongly believe that the benefits far outweigh the risks. Patient understands these risks and consented to starting treatment. Planned treatment duration is 10 years.  Return to clinic in 6 months for survivorship care plan visit

## 2018-10-23 NOTE — Progress Notes (Signed)
  Patient Name: Kristi [name] MRN: [id number] DOB: [birth year] Referring Physician: Nicholas Lose (Profile Not Attached) Date of Service: 10/22/2018  [facility] [place]                                                        End Of Treatment Note  Diagnoses: C50.412-Malignant neoplasm of upper-outer quadrant of left female breast  Cancer Staging: Malignant neoplasm of upper-outer quadrant of left breast in female, estrogen receptor positive ([place]) Staging form: Breast, AJCC 8th Edition - Clinical: Stage IIB (cT2, cN1, cM0, G3, ER+, PR+, HER2: Equivocal) - Signed by Nicholas Lose, MD on 01/04/2018  Intent: Curative  Radiation Treatment Dates: 09/06/2018 through 10/22/2018 Site Technique Total Dose Dose per Fx Completed Fx Beam Energies  Breast: CW_Lt 3D 50.4/50.4 1.8 28/28 6X, 10X  Breast: CW_Lt_SCV_PAB 3D 50.4/50.4 1.8 28/28 10X  Breast: CW_Lt_Bst Electron 10/10 2 5/5 6E   Narrative: The patient tolerated radiation therapy relatively well. She experienced increased fatigue and some expected skin irritation as she progressed through treatment. Erythema and hyperpigmentation developed over the reconstructed left breast with mild peeling at the left axilla. She is applying Radiaplex gel twice daily.   Plan: The patient will follow-up with radiation oncology in one month.  ________________________________________________  Eppie Gibson, MD  This document serves as a record of services personally performed by Eppie Gibson, MD. It was created on her behalf by Rae Lips, a trained medical scribe. The creation of this record is based on the scribe's personal observations and the provider's statements to them. This document has been checked and approved by the attending provider.

## 2018-10-24 ENCOUNTER — Telehealth: Payer: Self-pay | Admitting: Hematology and Oncology

## 2018-10-24 NOTE — Telephone Encounter (Signed)
No 3/31 los °

## 2018-10-25 ENCOUNTER — Encounter: Payer: Commercial Managed Care - PPO | Admitting: Physical Therapy

## 2018-10-25 ENCOUNTER — Encounter: Payer: Self-pay | Admitting: *Deleted

## 2018-10-26 ENCOUNTER — Telehealth: Payer: Self-pay | Admitting: Hematology and Oncology

## 2018-10-26 NOTE — Telephone Encounter (Signed)
Scheduled per 3/31 sch msg. Called patient. No answer.left msg. Will mail printout.

## 2018-11-01 ENCOUNTER — Encounter: Payer: Commercial Managed Care - PPO | Admitting: Physical Therapy

## 2018-11-02 ENCOUNTER — Encounter: Payer: Self-pay | Admitting: Adult Health

## 2018-11-05 ENCOUNTER — Encounter: Payer: Self-pay | Admitting: Hematology and Oncology

## 2018-11-05 NOTE — Addendum Note (Signed)
Addended by: Nicholas Lose on: 11/05/2018 06:59 AM   Modules accepted: Orders

## 2018-11-07 ENCOUNTER — Other Ambulatory Visit: Payer: Self-pay

## 2018-11-07 ENCOUNTER — Inpatient Hospital Stay: Payer: Commercial Managed Care - PPO | Attending: Hematology and Oncology

## 2018-11-07 VITALS — BP 121/85 | HR 92 | Temp 98.8°F | Resp 18

## 2018-11-07 DIAGNOSIS — Z5111 Encounter for antineoplastic chemotherapy: Secondary | ICD-10-CM | POA: Diagnosis not present

## 2018-11-07 DIAGNOSIS — Z170 Estrogen receptor positive status [ER+]: Secondary | ICD-10-CM

## 2018-11-07 DIAGNOSIS — C50412 Malignant neoplasm of upper-outer quadrant of left female breast: Secondary | ICD-10-CM | POA: Insufficient documentation

## 2018-11-07 MED ORDER — GOSERELIN ACETATE 3.6 MG ~~LOC~~ IMPL
3.6000 mg | DRUG_IMPLANT | Freq: Once | SUBCUTANEOUS | Status: AC
  Administered 2018-11-07: 3.6 mg via SUBCUTANEOUS

## 2018-11-07 MED ORDER — GOSERELIN ACETATE 3.6 MG ~~LOC~~ IMPL
DRUG_IMPLANT | SUBCUTANEOUS | Status: AC
  Filled 2018-11-07: qty 3.6

## 2018-11-07 NOTE — Patient Instructions (Signed)

## 2018-11-08 ENCOUNTER — Encounter: Payer: Self-pay | Admitting: Adult Health

## 2018-11-08 ENCOUNTER — Encounter (INDEPENDENT_AMBULATORY_CARE_PROVIDER_SITE_OTHER): Payer: Self-pay

## 2018-11-08 ENCOUNTER — Other Ambulatory Visit: Payer: Self-pay | Admitting: Adult Health

## 2018-11-09 ENCOUNTER — Ambulatory Visit: Payer: Commercial Managed Care - PPO | Admitting: Gastroenterology

## 2018-11-21 NOTE — Progress Notes (Signed)
I called the patient today about her upcoming follow-up appointment in radiation oncology.   Given concerns about the COVID-19 pandemic, I offered a phone assessment with the patient to determine if coming to the clinic was necessary. She accepted.  I let the patient know that I had spoken with Dr. Isidore Moos, and she wanted them to know the importance of washing their hands for at least 20 seconds at a time, especially after going out in public, and before they eat.  Limit going out in public whenever possible. Do not touch your face, unless your hands are clean, such as when bathing. Get plenty of rest, eat well, and stay hydrated.  The patient denies any symptomatic concerns.  Specifically, they report good healing of their skin in the radiation fields.  Skin is intact.    I recommended that she continue skin care by applying oil or lotion with vitamin E to the skin in the radiation fields, BID, for 2 more months.  Continue follow-up with medical oncology - follow-up is scheduled on 01/07/19 with Wilber Bihari with survivorship.  I explained that yearly mammograms are important for patients with intact breast tissue, and physical exams are important after mastectomy for patients that cannot undergo mammography.  I encouraged her to call if she had further questions or concerns about her healing. Otherwise, she will follow-up PRN in radiation oncology. Patient is pleased with this plan, and we will cancel her upcoming follow-up to reduce the risk of COVID-19 transmission.

## 2018-11-23 ENCOUNTER — Ambulatory Visit
Admission: RE | Admit: 2018-11-23 | Discharge: 2018-11-23 | Disposition: A | Discharge status: Home / Self Care | Payer: Commercial Managed Care - PPO | Source: Ambulatory Visit | Attending: Radiation Oncology | Admitting: Radiation Oncology

## 2018-12-03 ENCOUNTER — Telehealth: Payer: Self-pay | Admitting: Hematology and Oncology

## 2018-12-03 NOTE — Telephone Encounter (Signed)
Returned call re patient wanting to move 5/15 appointment to 5/13. Confirmed new appointment for 5/13 with patient.

## 2018-12-05 ENCOUNTER — Other Ambulatory Visit: Payer: Self-pay

## 2018-12-05 ENCOUNTER — Inpatient Hospital Stay: Payer: Commercial Managed Care - PPO | Attending: Hematology and Oncology

## 2018-12-05 DIAGNOSIS — Z9013 Acquired absence of bilateral breasts and nipples: Secondary | ICD-10-CM | POA: Diagnosis not present

## 2018-12-05 DIAGNOSIS — Z170 Estrogen receptor positive status [ER+]: Secondary | ICD-10-CM | POA: Diagnosis not present

## 2018-12-05 DIAGNOSIS — Z5111 Encounter for antineoplastic chemotherapy: Secondary | ICD-10-CM | POA: Diagnosis not present

## 2018-12-05 DIAGNOSIS — Z923 Personal history of irradiation: Secondary | ICD-10-CM | POA: Diagnosis not present

## 2018-12-05 DIAGNOSIS — C50412 Malignant neoplasm of upper-outer quadrant of left female breast: Secondary | ICD-10-CM

## 2018-12-05 MED ORDER — GOSERELIN ACETATE 3.6 MG ~~LOC~~ IMPL
3.6000 mg | DRUG_IMPLANT | Freq: Once | SUBCUTANEOUS | Status: AC
  Administered 2018-12-05: 3.6 mg via SUBCUTANEOUS

## 2018-12-05 MED ORDER — GOSERELIN ACETATE 3.6 MG ~~LOC~~ IMPL
DRUG_IMPLANT | SUBCUTANEOUS | Status: AC
  Filled 2018-12-05: qty 3.6

## 2018-12-05 NOTE — Patient Instructions (Signed)
Goserelin injection What is this medicine? GOSERELIN (GOE se rel in) is similar to a hormone found in the body. It lowers the amount of sex hormones that the body makes. Men will have lower testosterone levels and women will have lower estrogen levels while taking this medicine. In men, this medicine is used to treat prostate cancer; the injection is either given once per month or once every 12 weeks. A once per month injection (only) is used to treat women with endometriosis, dysfunctional uterine bleeding, or advanced breast cancer. This medicine may be used for other purposes; ask your health care provider or pharmacist if you have questions. COMMON BRAND NAME(S): Zoladex What should I tell my health care provider before I take this medicine? They need to know if you have any of these conditions (some only apply to women): -diabetes -heart disease or previous heart attack -high blood pressure -high cholesterol -kidney disease -osteoporosis or low bone density -problems passing urine -spinal cord injury -stroke -tobacco smoker -an unusual or allergic reaction to goserelin, hormone therapy, other medicines, foods, dyes, or preservatives -pregnant or trying to get pregnant -breast-feeding How should I use this medicine? This medicine is for injection under the skin. It is given by a health care professional in a hospital or clinic setting. Men receive this injection once every 4 weeks or once every 12 weeks. Women will only receive the once every 4 weeks injection. Talk to your pediatrician regarding the use of this medicine in children. Special care may be needed. Overdosage: If you think you have taken too much of this medicine contact a poison control center or emergency room at once. NOTE: This medicine is only for you. Do not share this medicine with others. What if I miss a dose? It is important not to miss your dose. Call your doctor or health care professional if you are unable to  keep an appointment. What may interact with this medicine? -female hormones like estrogen -herbal or dietary supplements like black cohosh, chasteberry, or DHEA -female hormones like testosterone -prasterone This list may not describe all possible interactions. Give your health care provider a list of all the medicines, herbs, non-prescription drugs, or dietary supplements you use. Also tell them if you smoke, drink alcohol, or use illegal drugs. Some items may interact with your medicine. What should I watch for while using this medicine? Visit your doctor or health care professional for regular checks on your progress. Your symptoms may appear to get worse during the first weeks of this therapy. Tell your doctor or healthcare professional if your symptoms do not start to get better or if they get worse after this time. Your bones may get weaker if you take this medicine for a long time. If you smoke or frequently drink alcohol you may increase your risk of bone loss. A family history of osteoporosis, chronic use of drugs for seizures (convulsions), or corticosteroids can also increase your risk of bone loss. Talk to your doctor about how to keep your bones strong. This medicine should stop regular monthly menstration in women. Tell your doctor if you continue to menstrate. Women should not become pregnant while taking this medicine or for 12 weeks after stopping this medicine. Women should inform their doctor if they wish to become pregnant or think they might be pregnant. There is a potential for serious side effects to an unborn child. Talk to your health care professional or pharmacist for more information. Do not breast-feed an infant while taking   this medicine. Men should inform their doctors if they wish to father a child. This medicine may lower sperm counts. Talk to your health care professional or pharmacist for more information. What side effects may I notice from receiving this  medicine? Side effects that you should report to your doctor or health care professional as soon as possible: -allergic reactions like skin rash, itching or hives, swelling of the face, lips, or tongue -bone pain -breathing problems -changes in vision -chest pain -feeling faint or lightheaded, falls -fever, chills -pain, swelling, warmth in the leg -pain, tingling, numbness in the hands or feet -signs and symptoms of low blood pressure like dizziness; feeling faint or lightheaded, falls; unusually weak or tired -stomach pain -swelling of the ankles, feet, hands -trouble passing urine or change in the amount of urine -unusually high or low blood pressure -unusually weak or tired Side effects that usually do not require medical attention (report to your doctor or health care professional if they continue or are bothersome): -change in sex drive or performance -changes in breast size in both males and females -changes in emotions or moods -headache -hot flashes -irritation at site where injected -loss of appetite -skin problems like acne, dry skin -vaginal dryness This list may not describe all possible side effects. Call your doctor for medical advice about side effects. You may report side effects to FDA at 1-800-FDA-1088. Where should I keep my medicine? This drug is given in a hospital or clinic and will not be stored at home. NOTE: This sheet is a summary. It may not cover all possible information. If you have questions about this medicine, talk to your doctor, pharmacist, or health care provider.  2019 Elsevier/Gold Standard (2013-09-17 11:10:35)  

## 2018-12-07 ENCOUNTER — Ambulatory Visit: Payer: Commercial Managed Care - PPO

## 2018-12-24 ENCOUNTER — Telehealth: Payer: Self-pay | Admitting: Adult Health

## 2018-12-24 NOTE — Telephone Encounter (Signed)
Called and spoke to patient about mychart care companion AET adherence study.  Reviewed that we had not received any recent surveys.  Patient notes that she has not been getting any notifications on her phone, and has not been able to access the surveys through her to do list.  I let her know that we are mailing her an improved "how to" guide, to assist her in accessing the information.    Wilber Bihari, NP

## 2018-12-27 ENCOUNTER — Encounter (INDEPENDENT_AMBULATORY_CARE_PROVIDER_SITE_OTHER): Payer: Self-pay

## 2018-12-31 ENCOUNTER — Telehealth: Payer: Self-pay | Admitting: *Deleted

## 2018-12-31 NOTE — Telephone Encounter (Signed)
Called pt to ensure she was taking anastrozole daily d/t questionnaire flag. Pt informed she has been taking it daily as prescribed. She took the questionnaire in the middle of the week.  Mendel Ryder, NP notified of pt response and cause of questionnaire flag.

## 2019-01-03 ENCOUNTER — Telehealth: Payer: Self-pay | Admitting: Adult Health

## 2019-01-03 ENCOUNTER — Encounter (INDEPENDENT_AMBULATORY_CARE_PROVIDER_SITE_OTHER): Payer: Self-pay

## 2019-01-03 NOTE — Telephone Encounter (Signed)
Called patient regarding upcoming Webex appointment, test run complete and an e-mail has been sent.

## 2019-01-04 ENCOUNTER — Telehealth: Payer: Self-pay | Admitting: Hematology and Oncology

## 2019-01-04 NOTE — Telephone Encounter (Signed)
Confirmed December f/u with patient. Other appointments remain the same.

## 2019-01-06 ENCOUNTER — Encounter (INDEPENDENT_AMBULATORY_CARE_PROVIDER_SITE_OTHER): Payer: Self-pay

## 2019-01-07 ENCOUNTER — Inpatient Hospital Stay: Payer: Commercial Managed Care - PPO | Attending: Hematology and Oncology | Admitting: Adult Health

## 2019-01-07 ENCOUNTER — Encounter: Payer: Self-pay | Admitting: Adult Health

## 2019-01-07 ENCOUNTER — Inpatient Hospital Stay: Payer: Commercial Managed Care - PPO

## 2019-01-07 ENCOUNTER — Other Ambulatory Visit: Payer: Self-pay

## 2019-01-07 ENCOUNTER — Telehealth: Payer: Self-pay | Admitting: Adult Health

## 2019-01-07 VITALS — BP 121/59 | HR 87 | Temp 97.4°F | Resp 18 | Ht 65.0 in | Wt 140.2 lb

## 2019-01-07 DIAGNOSIS — C50412 Malignant neoplasm of upper-outer quadrant of left female breast: Secondary | ICD-10-CM | POA: Diagnosis present

## 2019-01-07 DIAGNOSIS — E2839 Other primary ovarian failure: Secondary | ICD-10-CM

## 2019-01-07 DIAGNOSIS — N951 Menopausal and female climacteric states: Secondary | ICD-10-CM | POA: Diagnosis not present

## 2019-01-07 DIAGNOSIS — Z5111 Encounter for antineoplastic chemotherapy: Secondary | ICD-10-CM | POA: Diagnosis not present

## 2019-01-07 DIAGNOSIS — Z9013 Acquired absence of bilateral breasts and nipples: Secondary | ICD-10-CM | POA: Diagnosis not present

## 2019-01-07 DIAGNOSIS — Z923 Personal history of irradiation: Secondary | ICD-10-CM

## 2019-01-07 DIAGNOSIS — Z170 Estrogen receptor positive status [ER+]: Secondary | ICD-10-CM | POA: Diagnosis not present

## 2019-01-07 DIAGNOSIS — Z79811 Long term (current) use of aromatase inhibitors: Secondary | ICD-10-CM

## 2019-01-07 DIAGNOSIS — Z803 Family history of malignant neoplasm of breast: Secondary | ICD-10-CM

## 2019-01-07 DIAGNOSIS — Z800 Family history of malignant neoplasm of digestive organs: Secondary | ICD-10-CM

## 2019-01-07 MED ORDER — GOSERELIN ACETATE 3.6 MG ~~LOC~~ IMPL
DRUG_IMPLANT | SUBCUTANEOUS | Status: AC
  Filled 2019-01-07: qty 3.6

## 2019-01-07 MED ORDER — VENLAFAXINE HCL ER 37.5 MG PO CP24: 37.5000 mg | ORAL_CAPSULE | Freq: Every day | ORAL | 0 refills | Status: DC

## 2019-01-07 MED ORDER — GOSERELIN ACETATE 3.6 MG ~~LOC~~ IMPL
3.6000 mg | DRUG_IMPLANT | Freq: Once | SUBCUTANEOUS | Status: AC
  Administered 2019-01-07: 3.6 mg via SUBCUTANEOUS

## 2019-01-07 NOTE — Progress Notes (Signed)
CLINIC:  Survivorship   REASON FOR VISIT:  Routine follow-up post-treatment for a recent history of breast cancer.  BRIEF ONCOLOGIC HISTORY:  Oncology History  Malignant neoplasm of upper-outer quadrant of left breast in female, estrogen receptor positive ([place])  01/02/2018 Initial Diagnosis   Palpable lump left breast, mammogram: Left breast UOQ 2.3 cm mass, axilla has several enlarged lymph nodes; 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   01/04/2018 Cancer Staging   Staging form: Breast, AJCC 8th Edition - Clinical: Stage IIB (cT2, cN1, cM0, G3, ER+, PR+, HER2: Equivocal) - Signed by Nicholas Lose, MD on 01/04/2018   01/16/2018 - 06/18/2018 Neo-Adjuvant Chemotherapy   Dose dense Adriamycin and Cytoxan x4 followed by Taxol, switched to Abraxane with cycle 2 after Taxol reaction with palpitations weekly x12   01/19/2018 Genetic Testing   STK11 c.608C>T VUS identified on the Multicancer panel.  The Multi-Gene Panel offered by [facility] includes sequencing and/or deletion duplication testing of the following 83 genes: ALK, APC, ATM, AXIN2,BAP1,  BARD1, BLM, BMPR1A, BRCA1, BRCA2, BRIP1, CASR, CDC73, CDH1, CDK4, CDKN1B, CDKN1C, CDKN2A (p14ARF), CDKN2A (p16INK4a), CEBPA, CHEK2, CTNNA1, DICER1, DIS3L2, EGFR (c.2369C>T, p.Thr790Met variant only), EPCAM (Deletion/duplication testing only), FH, FLCN, GATA2, GPC3, GREM1 (Promoter region deletion/duplication testing only), HOXB13 (c.251G>A, p.Gly84Glu), HRAS, KIT, MAX, MEN1, MET, MITF (c.952G>A, p.Glu318Lys variant only), MLH1, MSH2, MSH3, MSH6, MUTYH, NBN, NF1, NF2, NTHL1, PALB2, PDGFRA, PHOX2B, PMS2, POLD1, POLE, POT1, PRKAR1A, PTCH1, PTEN, RAD50, RAD51C, RAD51D, RB1, RECQL4, RET, RUNX1, SDHAF2, SDHA (sequence changes only), SDHB, SDHC, SDHD, SMAD4, SMARCA4, SMARCB1, SMARCE1, STK11, SUFU, TERT, TERT, TMEM127, TP53, TSC1, TSC2, VHL, WRN and WT1.  The  report date is January 19, 2018.   06/13/2018 Breast MRI   Significant improvement of left breast cancer previously 2.4 x 3.2 x 4 cm, currently 1.4 x 1.3 x 0.8 cm, no lymph nodes   07/02/2018 Surgery   Bilateral mastectomies: Left mastectomy: IDC grade 2, 0.9 cm, margins negative, 1/4 lymph nodes positive with extracapsular extension, margins negative ypT1b ypN1 ER 75% strong, PR 70% strong, HER-2 negative, Ki-67 60%   07/24/2018 Surgery   Left axillary lymph node dissection: 0/4 lymph nodes   09/07/2018 - 10/22/2018 Radiation Therapy   Adjuvant radiation   10/2018 -  Anti-estrogen oral therapy   Zoladex and Anastrozole     INTERVAL HISTORY:  Kristi [name] presents to the [facility] today for our initial meeting to review her survivorship care plan detailing her treatment course for breast cancer, as well as monitoring long-term side effects of that treatment, education regarding health maintenance, screening, and overall wellness and health promotion.     Overall, Kristi [name] reports feeling quite well.  She is receiving Zoladex every 4 weeks and is taking Anastrozole daily.   She has had some mood changes associated with anxiety.  She denies depression, increased tearfulness, or suicidal ideation.  She notes that she has hot flashes that seem to be worsening.  She is taking Gabapentin at night which helps some, but not completely.  She does continue to wake up at night with hot flashes.    She is exercising regularly.  She notes she is doing this about 6 days per week. She met with her gynecologist and is discussing BSO for surgical ovarian suppression as opposed to chemical with the Zoladex.     REVIEW OF SYSTEMS:  Review of Systems  Constitutional: Negative for appetite  change, chills, fatigue, fever and unexpected weight change.  HENT:   Negative for hearing loss, lump/mass, mouth sores, sore throat and trouble swallowing.   Eyes: Negative for eye problems and icterus.    Respiratory: Negative for chest tightness, cough and shortness of breath.   Cardiovascular: Negative for chest pain, leg swelling and palpitations.  Gastrointestinal: Negative for abdominal distention, abdominal pain, constipation, diarrhea, nausea and vomiting.  Endocrine: Positive for hot flashes.  Musculoskeletal: Negative for arthralgias.  Skin: Negative for itching and rash.  Neurological: Negative for dizziness, extremity weakness, headaches and numbness.  Psychiatric/Behavioral: Positive for sleep disturbance. Negative for depression and suicidal ideas. The patient is nervous/anxious.   Breast: Denies any new nodularity, masses, tenderness, nipple changes, or nipple discharge.      ONCOLOGY TREATMENT TEAM:  1. Surgeon:  Dr. Donne Hazel at [facility] 2. Medical Oncologist: Dr. Lindi Adie  3. Radiation Oncologist: Dr. Isidore Moos    PAST MEDICAL/SURGICAL HISTORY:  Past Medical History:  Diagnosis Date   Breast cancer, left ([place]) 12/2017   Endometriosis    Family history of breast cancer    Family history of ovarian cancer    Family history of pancreatic cancer    Family history of prostate cancer    History of chemotherapy    finished chemo 06/18/2018   Migraines    PONV (postoperative nausea and vomiting)    Wears contact lenses    Past Surgical History:  Procedure Laterality Date   AXILLARY LYMPH NODE DISSECTION Left 07/24/2018   AXILLARY LYMPH NODE DISSECTION Left 07/24/2018   Procedure: LEFT AXILLARY LYMPH NODE DISSECTION;  Surgeon: Rolm Bookbinder, MD;  Location: [place];  Service: General;  Laterality: Left;  GENERAL WITH PEC BLOCK   BREAST RECONSTRUCTION WITH PLACEMENT OF TISSUE EXPANDER AND ALLODERM Bilateral 07/02/2018   Procedure: BILATERAL BREAST RECONSTRUCTION WITH PLACEMENT OF TISSUE EXPANDER AND ACELLULAR DERMIS;  Surgeon: Irene Limbo, MD;  Location[place];  Service: Plastics;  Laterality: Bilateral;    COLONOSCOPY     MASTECTOMY WITH RADIOACTIVE SEED GUIDED EXCISION AND AXILLARY SENTINEL LYMPH NODE BIOPSY Bilateral 07/02/2018   Procedure: BILATERAL NIPPLE SPARING MASTECTOMIES WITH LEFT AXILLARY SENTINEL LYMPH NODE BIOPSY AND LEFT SEED GUIDED NODE EXCISION;  Surgeon: Rolm Bookbinder, MD;  Location: [place];  Service: General;  Laterality: Bilateral;   NASAL POLYP EXCISION     OVARIAN CYST REMOVAL     PORTA CATH REMOVAL Right 07/02/2018   Procedure: PORTA CATH REMOVAL;  Surgeon: Rolm Bookbinder, MD;  Location: [place];  Service: General;  Laterality: Right;   PORTACATH PLACEMENT Right 01/15/2018   Procedure: INSERTION PORT-A-CATH WITH [place];  Surgeon: Rolm Bookbinder, MD;  Location: [place];  Service: General;  Laterality: Right;   WISDOM TOOTH EXTRACTION       ALLERGIES:  Allergies  Allergen Reactions   Sulfa Antibiotics Rash and Other (See Comments)    SEVERE HEADACHE   Chlorhexidine Gluconate Other (See Comments)    redness   Other Itching and Rash    Doesn't seem to tolerate surgical glue Surgical glue Dermabond     CURRENT MEDICATIONS:  Outpatient Encounter Medications as of 01/07/2019  Medication Sig   anastrozole (ARIMIDEX) 1 MG tablet Take 1 tablet (1 mg total) by mouth daily.   baclofen (LIORESAL) 10 MG tablet Take by mouth.   buPROPion (WELLBUTRIN) 75 MG tablet Take 75 mg by mouth 2 (two) times daily.   eletriptan (RELPAX) 40 MG tablet Take 40 mg  by mouth every 2 (two) hours as needed for migraine (max 2 doses per 24 hrs.).    EMGALITY 120 MG/ML SOAJ Inject 120 mg into the skin every 30 (thirty) days.    fexofenadine (ALLEGRA) 180 MG tablet Take 180 mg by mouth daily.   gabapentin (NEURONTIN) 300 MG capsule Take 1 capsule (300 mg total) by mouth at bedtime.   Goserelin Acetate (ZOLADEX ) Inject 1 Dose into the skin every 30 (thirty) days.    LORazepam (ATIVAN) 1 MG tablet Take 1 tablet (1 mg  total) by mouth 3 (three) times daily as needed (agitation/restlessness).   Melatonin 5 MG TABS Take by mouth at bedtime.   pantoprazole (PROTONIX) 40 MG tablet Take 1 tablet (40 mg total) by mouth 2 (two) times daily.   Soft Lens Products (REWETTING DROPS) SOLN Place 1 drop into both eyes 3 (three) times daily as needed (contact lenses.).   venlafaxine XR (EFFEXOR-XR) 37.5 MG 24 hr capsule Take 1 capsule (37.5 mg total) by mouth daily with breakfast.   Facility-Administered Encounter Medications as of 01/07/2019  Medication   0.9 %  sodium chloride infusion   goserelin (ZOLADEX) injection 3.6 mg   sodium chloride flush (NS) 0.9 % injection 10 mL     ONCOLOGIC FAMILY HISTORY:  Family History  Problem Relation Age of Onset   Prostate cancer Father 55       Stage 1   Ovarian cancer Maternal Aunt 91       ? Germ cell?   Prostate cancer Maternal Uncle 38       prostectomy   Pancreatic cancer Maternal Grandmother 82   Colon cancer Maternal Grandfather 61   Heart attack Paternal Grandmother    Prostate cancer Paternal Grandfather        dx in his 50s, d. 26s-90s   Breast cancer Other        MGMs sister dx over 25   Stomach cancer Neg Hx    Esophageal cancer Neg Hx      GENETIC COUNSELING/TESTING: See above  SOCIAL HISTORY:  Social History   Socioeconomic History   Marital status: Single    Spouse name: Not on file   Number of children: Not on file   Years of education: Not on file   Highest education level: Not on file  Occupational History   Not on file  Social Needs   Financial resource strain: Not on file   Food insecurity    Worry: Not on file    Inability: Not on file   Transportation needs    Medical: No    Non-medical: No  Tobacco Use   Smoking status: Never Smoker   Smokeless tobacco: Never Used  Substance and Sexual Activity   Alcohol use: Yes    Comment: rare   Drug use: Never   Sexual activity: Not on file  Lifestyle    Physical activity    Days per week: Not on file    Minutes per session: Not on file   Stress: Not on file  Relationships   Social connections    Talks on phone: Not on file    Gets together: Not on file    Attends religious service: Not on file    Active member of club or organization: Not on file    Attends meetings of clubs or organizations: Not on file    Relationship status: Not on file   Intimate partner violence    Fear of current or ex  partner: No    Emotionally abused: No    Physically abused: No    Forced sexual activity: No  Other Topics Concern   Not on file  Social History Narrative   Not on file     PHYSICAL EXAMINATION:  Vital Signs:   Vitals:   01/07/19 1024  BP: (!) 121/59  Pulse: 87  Resp: 18  Temp: (!) 97.4 F (36.3 C)  SpO2: 100%   Filed Weights   01/07/19 1024  Weight: 140 lb 3.2 oz (63.6 kg)   General: Well-nourished, well-appearing female in no acute distress.  She is unaccompanied today.   HEENT: Head is normocephalic.  Pupils equal and reactive to light. Conjunctivae clear without exudate.  Sclerae anicteric. Oral mucosa is pink, moist.  Oropharynx is pink without lesions or erythema.  Lymph: No cervical, supraclavicular, or infraclavicular lymphadenopathy noted on palpation.  Cardiovascular: Regular rate and rhythm.Marland Kitchen Respiratory: Clear to auscultation bilaterally. Chest expansion symmetric; breathing non-labored.  Breasts: s/p bilateral mastectomies with expander placement.   GI: Abdomen soft and round; non-tender, non-distended. Bowel sounds normoactive.  GU: Deferred.  Neuro: No focal deficits. Steady gait.  Psych: Mood and affect normal and appropriate for situation.  Extremities: No edema. MSK: No focal spinal tenderness to palpation.  Full range of motion in bilateral upper extremities Skin: Warm and dry.  LABORATORY DATA:  None for this visit.  DIAGNOSTIC IMAGING:  None for this visit.     ASSESSMENT AND PLAN:  Ms..  [name] is a pleasant 43 y.o. female with Stage IIB left breast invasive ductal carcinoma, ER+/PR+/HER2-, diagnosed in 12/2017, treated with neoadjuvant chemotherapy, bilateral mastectomies, adjuvant radiation therapy, and anti-estrogen therapy with Anastrozole beginning in 10/2018 (Zoladex started during chemotherapy).  She presents to the Survivorship Clinic for our initial meeting and routine follow-up post-completion of treatment for breast cancer.    1. Stage IIB left breast cancer:  Kristi [name] is continuing to recover from definitive treatment for breast cancer. She will follow-up with her medical oncologist, Dr. Lindi Adie in 6 months with history and physical exam per surveillance protocol.  She will continue her anti-estrogen therapy with Anastrozole. Thus far, she is tolerating the Anastrozole well, with minimal side effects. She was instructed to make Dr. Lindi Adie or myself aware if she begins to experience any worsening side effects of the medication and I could see her back in clinic to help manage those side effects, as needed. T Today, a comprehensive survivorship care plan and treatment summary was reviewed with the patient today detailing her breast cancer diagnosis, treatment course, potential late/long-term effects of treatment, appropriate follow-up care with recommendations for the future, and patient education resources.  A copy of this summary, along with a letter will be sent to the patients primary care provider via mail/fax/In Basket message after todays visit.    2. Hot flashes/mood changes: We reviewed non pharmacologic interventions in detail.  I also reviewed Effexor that can help both her hot flashes and anxiety.  She would like to try taking this.  I wrote for 37.20m daily and we reviewed risks/benefits of the medication.  She knows to call if she has issues with taking it, or if she needs a dosage increase in a few weeks.  Detailed information was listed in her AVS about the  medication.    3. Bone health:  Given Kristi [name]'s age/history of breast cancer and her current treatment regimen including anti-estrogen therapy with Anastrozole, she is at risk for bone demineralization.  She  has not undergone bone density testing, and I ordered baseline testing for her to undergo today.  In the meantime, she was encouraged to increase her consumption of foods rich in calcium, as well as increase her weight-bearing activities.  She was given education on specific activities to promote bone health.  4. Cancer screening:  Due to Kristi [name]'s history and her age, she should receive screening for skin cancers, colon cancer, and gynecologic cancers.  The information and recommendations are listed on the patient's comprehensive care plan/treatment summary and were reviewed in detail with the patient.    5. Health maintenance and wellness promotion: Kristi [name] was encouraged to consume 5-7 servings of fruits and vegetables per day. We reviewed the "Nutrition Rainbow" handout, as well as the handout "Take Control of Your Health and Reduce Your Cancer Risk" from the [place].  She was also encouraged to engage in moderate to vigorous exercise for 30 minutes per day most days of the week. We discussed the [employer] fitness program, which is designed for cancer survivors to help them become more physically fit after cancer treatments.  She was instructed to limit her alcohol consumption and continue to abstain from tobacco use.     6. Support services/counseling: It is not uncommon for this period of the patient's cancer care trajectory to be one of many emotions and stressors.  We discussed an opportunity for her to participate in the next session of [facility] ("Finding Your New Normal") support group series designed for patients after they have completed treatment.   Kristi [name] was encouraged to take advantage of our many other support services programs, support groups,  and/or counseling in coping with her new life as a cancer survivor after completing anti-cancer treatment.  She was offered support today through active listening and expressive supportive counseling.  She was given information regarding our available services and encouraged to contact me with any questions or for help enrolling in any of our support group/programs.    Dispo:   -Return to cancer center in 6 months for f/u with Dr. Lindi Adie  -Bone Density is due -Follow up with surgery per Dr. Donne Hazel -She is welcome to return back to the Survivorship Clinic at any time; no additional follow-up needed at this time.  -Consider referral back to survivorship as a long-term survivor for continued surveillance  A total of (30) minutes of face-to-face time was spent with this patient with greater than 50% of that time in counseling and care-coordination.   Gardenia Phlegm, NP Survivorship Program [facility] [phone]   Note: PRIMARY CARE PROVIDER Blair Heys, [place] [phone]

## 2019-01-07 NOTE — Patient Instructions (Addendum)
Goserelin injection What is this medicine? GOSERELIN (GOE se rel in) is similar to a hormone found in the body. It lowers the amount of sex hormones that the body makes. Men will have lower testosterone levels and women will have lower estrogen levels while taking this medicine. In men, this medicine is used to treat prostate cancer; the injection is either given once per month or once every 12 weeks. A once per month injection (only) is used to treat women with endometriosis, dysfunctional uterine bleeding, or advanced breast cancer. This medicine may be used for other purposes; ask your health care provider or pharmacist if you have questions. COMMON BRAND NAME(S): Zoladex What should I tell my health care provider before I take this medicine? They need to know if you have any of these conditions (some only apply to women): -diabetes -heart disease or previous heart attack -high blood pressure -high cholesterol -kidney disease -osteoporosis or low bone density -problems passing urine -spinal cord injury -stroke -tobacco smoker -an unusual or allergic reaction to goserelin, hormone therapy, other medicines, foods, dyes, or preservatives -pregnant or trying to get pregnant -breast-feeding How should I use this medicine? This medicine is for injection under the skin. It is given by a health care professional in a hospital or clinic setting. Men receive this injection once every 4 weeks or once every 12 weeks. Women will only receive the once every 4 weeks injection. Talk to your pediatrician regarding the use of this medicine in children. Special care may be needed. Overdosage: If you think you have taken too much of this medicine contact a poison control center or emergency room at once. NOTE: This medicine is only for you. Do not share this medicine with others. What if I miss a dose? It is important not to miss your dose. Call your doctor or health care professional if you are unable to  keep an appointment. What may interact with this medicine? -female hormones like estrogen -herbal or dietary supplements like black cohosh, chasteberry, or DHEA -female hormones like testosterone -prasterone This list may not describe all possible interactions. Give your health care provider a list of all the medicines, herbs, non-prescription drugs, or dietary supplements you use. Also tell them if you smoke, drink alcohol, or use illegal drugs. Some items may interact with your medicine. What should I watch for while using this medicine? Visit your doctor or health care professional for regular checks on your progress. Your symptoms may appear to get worse during the first weeks of this therapy. Tell your doctor or healthcare professional if your symptoms do not start to get better or if they get worse after this time. Your bones may get weaker if you take this medicine for a long time. If you smoke or frequently drink alcohol you may increase your risk of bone loss. A family history of osteoporosis, chronic use of drugs for seizures (convulsions), or corticosteroids can also increase your risk of bone loss. Talk to your doctor about how to keep your bones strong. This medicine should stop regular monthly menstration in women. Tell your doctor if you continue to menstrate. Women should not become pregnant while taking this medicine or for 12 weeks after stopping this medicine. Women should inform their doctor if they wish to become pregnant or think they might be pregnant. There is a potential for serious side effects to an unborn child. Talk to your health care professional or pharmacist for more information. Do not breast-feed an infant while taking   this medicine. Men should inform their doctors if they wish to father a child. This medicine may lower sperm counts. Talk to your health care professional or pharmacist for more information. What side effects may I notice from receiving this  medicine? Side effects that you should report to your doctor or health care professional as soon as possible: -allergic reactions like skin rash, itching or hives, swelling of the face, lips, or tongue -bone pain -breathing problems -changes in vision -chest pain -feeling faint or lightheaded, falls -fever, chills -pain, swelling, warmth in the leg -pain, tingling, numbness in the hands or feet -signs and symptoms of low blood pressure like dizziness; feeling faint or lightheaded, falls; unusually weak or tired -stomach pain -swelling of the ankles, feet, hands -trouble passing urine or change in the amount of urine -unusually high or low blood pressure -unusually weak or tired Side effects that usually do not require medical attention (report to your doctor or health care professional if they continue or are bothersome): -change in sex drive or performance -changes in breast size in both males and females -changes in emotions or moods -headache -hot flashes -irritation at site where injected -loss of appetite -skin problems like acne, dry skin -vaginal dryness This list may not describe all possible side effects. Call your doctor for medical advice about side effects. You may report side effects to FDA at 1-800-FDA-1088. Where should I keep my medicine? This drug is given in a hospital or clinic and will not be stored at home. NOTE: This sheet is a summary. It may not cover all possible information. If you have questions about this medicine, talk to your doctor, pharmacist, or health care provider.  2019 Elsevier/Gold Standard (2013-09-17 11:10:35) Venlafaxine tablets What is this medicine? VENLAFAXINE (VEN la fax een) is used to treat depression, anxiety and panic disorder. This medicine may be used for other purposes; ask your health care provider or pharmacist if you have questions. COMMON BRAND NAME(S): Effexor What should I tell my health care provider before I take this  medicine? They need to know if you have any of these conditions: -bleeding disorders -glaucoma -heart disease -high blood pressure -high cholesterol -kidney disease -liver disease -low levels of sodium in the blood -mania or bipolar disorder -seizures -suicidal thoughts, plans, or attempt; a previous suicide attempt by you or a family -take medicines that treat or prevent blood clots -thyroid disease -an unusual or allergic reaction to venlafaxine, desvenlafaxine, other medicines, foods, dyes, or preservatives -pregnant or trying to get pregnant -breast-feeding How should I use this medicine? Take this medicine by mouth with a glass of water. Follow the directions on the prescription label. Take it with food. Take your medicine at regular intervals. Do not take your medicine more often than directed. Do not stop taking this medicine suddenly except upon the advice of your doctor. Stopping this medicine too quickly may cause serious side effects or your condition may worsen. A special MedGuide will be given to you by the pharmacist with each prescription and refill. Be sure to read this information carefully each time. Talk to your pediatrician regarding the use of this medicine in children. Special care may be needed. Overdosage: If you think you have taken too much of this medicine contact a poison control center or emergency room at once. NOTE: This medicine is only for you. Do not share this medicine with others. What if I miss a dose? If you miss a dose, take it as soon as  you can. If it is almost time for your next dose, take only that dose. Do not take double or extra doses. What may interact with this medicine? Do not take this medicine with any of the following medications: -certain medicines for fungal infections like fluconazole, itraconazole, ketoconazole, posaconazole,  voriconazole -cisapride -desvenlafaxine -dofetilide -dronedarone -duloxetine -levomilnacipran -linezolid -MAOIs like Carbex, Eldepryl, Marplan, Nardil, and Parnate -methylene blue (injected into a vein) -milnacipran -pimozide -thioridazine -ziprasidone This medicine may also interact with the following medications: -amphetamines -aspirin and aspirin-like medicines -certain medicines for depression, anxiety, or psychotic disturbances -certain medicines for migraine headaches like almotriptan, eletriptan, frovatriptan, naratriptan, rizatriptan, sumatriptan, zolmitriptan -certain medicines for sleep -certain medicines that treat or prevent blood clots like dalteparin, enoxaparin, warfarin -cimetidine -clozapine -diuretics -fentanyl -furazolidone -indinavir -isoniazid -lithium -metoprolol -NSAIDS, medicines for pain and inflammation, like ibuprofen or naproxen -other medicines that prolong the QT interval (cause an abnormal heart rhythm) -procarbazine -rasagiline -supplements like St. John's wort, kava kava, valerian -tramadol -tryptophan This list may not describe all possible interactions. Give your health care provider a list of all the medicines, herbs, non-prescription drugs, or dietary supplements you use. Also tell them if you smoke, drink alcohol, or use illegal drugs. Some items may interact with your medicine. What should I watch for while using this medicine? Tell your doctor if your symptoms do not get better or if they get worse. Visit your doctor or health care professional for regular checks on your progress. Because it may take several weeks to see the full effects of this medicine, it is important to continue your treatment as prescribed by your doctor. Patients and their families should watch out for new or worsening thoughts of suicide or depression. Also watch out for sudden changes in feelings such as feeling anxious, agitated, panicky, irritable, hostile,  aggressive, impulsive, severely restless, overly excited and hyperactive, or not being able to sleep. If this happens, especially at the beginning of treatment or after a change in dose, call your health care professional. This medicine can cause an increase in blood pressure. Check with your doctor for instructions on monitoring your blood pressure while taking this medicine. You may get drowsy or dizzy. Do not drive, use machinery, or do anything that needs mental alertness until you know how this medicine affects you. Do not stand or sit up quickly, especially if you are an older patient. This reduces the risk of dizzy or fainting spells. Alcohol may interfere with the effect of this medicine. Avoid alcoholic drinks. Your mouth may get dry. Chewing sugarless gum, sucking hard candy and drinking plenty of water will help. Contact your doctor if the problem does not go away or is severe. What side effects may I notice from receiving this medicine? Side effects that you should report to your doctor or health care professional as soon as possible: -allergic reactions like skin rash, itching or hives, swelling of the face, lips, or tongue -anxious -breathing problems -confusion -changes in vision -chest pain -confusion -elevated mood, decreased need for sleep, racing thoughts, impulsive behavior -eye pain -fast, irregular heartbeat -feeling faint or lightheaded, falls -feeling agitated, angry, or irritable -hallucination, loss of contact with reality -high blood pressure -loss of balance or coordination -palpitations -redness, blistering, peeling or loosening of the skin, including inside the mouth -restlessness, pacing, inability to keep still -seizures -stiff muscles -suicidal thoughts or other mood changes -trouble passing urine or change in the amount of urine -trouble sleeping -unusual bleeding or bruising -unusually weak or  tired -vomiting Side effects that usually do not require  medical attention (report to your doctor or health care professional if they continue or are bothersome): -change in sex drive or performance -change in appetite or weight -constipation -dizziness -dry mouth -headache -increased sweating -nausea -tired This list may not describe all possible side effects. Call your doctor for medical advice about side effects. You may report side effects to FDA at 1-800-FDA-1088. Where should I keep my medicine? Keep out of the reach of children. Store at a controlled temperature between 20 and 25 degrees C (68 and 77 degrees F), in a dry place. Throw away any unused medicine after the expiration date. NOTE: This sheet is a summary. It may not cover all possible information. If you have questions about this medicine, talk to your doctor, pharmacist, or health care provider.  2019 Elsevier/Gold Standard (2015-12-10 18:42:26)

## 2019-01-07 NOTE — Telephone Encounter (Signed)
Patient with hot flashes and mood changes on anti estrogen therapy.  Will review with her at her appointment today.

## 2019-01-08 ENCOUNTER — Telehealth: Payer: Self-pay | Admitting: Hematology and Oncology

## 2019-01-08 NOTE — Telephone Encounter (Signed)
I talk with patient regarding schedule  

## 2019-01-21 ENCOUNTER — Telehealth: Payer: Self-pay | Admitting: Adult Health

## 2019-01-21 NOTE — Telephone Encounter (Signed)
Called and LMOM about AET study.  Wilber Bihari, NP

## 2019-01-25 NOTE — Progress Notes (Signed)
HEMATOLOGY-ONCOLOGY DOXIMITY VISIT PROGRESS NOTE  I connected with Sanaiyah Kirchhoff Kaupp on 01/28/2019 at  8:45 AM EDT by Doximity video conference and verified that I am speaking with the correct person using two identifiers.  I discussed the limitations, risks, security and privacy concerns of performing an evaluation and management service by Doximity and the availability of in person appointments.  I also discussed with the patient that there may be a patient responsible charge related to this service. The patient expressed understanding and agreed to proceed.  Patient's Location: Home Physician Location: Clinic  CHIEF COMPLIANT: Follow-up to discuss [name] clinical trial  INTERVAL HISTORY: Marijose Curington is a 43 y.o. female with above-mentioned history of left breast cancer treated with neoadjuvant chemotherapy followed by lumpectomy and radiation who is currently on Zoladex with anastrozole. She presents over Doximity today to discuss the [facility] clinical trial.   Oncology History  Malignant neoplasm of upper-outer quadrant of left breast in female, estrogen receptor positive ([place])  01/02/2018 Initial Diagnosis   Palpable lump left breast, mammogram: Left breast UOQ 2.3 cm mass, axilla has several enlarged lymph nodes; 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   01/04/2018 Cancer Staging   Staging form: Breast, AJCC 8th Edition - Clinical: Stage IIB (cT2, cN1, cM0, G3, ER+, PR+, HER2: Equivocal) - Signed by Nicholas Lose, MD on 01/04/2018   01/16/2018 - 06/18/2018 Neo-Adjuvant Chemotherapy   Dose dense Adriamycin and Cytoxan x4 followed by Taxol, switched to Abraxane with cycle 2 after Taxol reaction with palpitations weekly x12   01/19/2018 Genetic Testing   STK11 c.608C>T VUS identified on the Multicancer panel.  The Multi-Gene Panel offered by [facility] includes  sequencing and/or deletion duplication testing of the following 83 genes: ALK, APC, ATM, AXIN2,BAP1,  BARD1, BLM, BMPR1A, BRCA1, BRCA2, BRIP1, CASR, CDC73, CDH1, CDK4, CDKN1B, CDKN1C, CDKN2A (p14ARF), CDKN2A (p16INK4a), CEBPA, CHEK2, CTNNA1, DICER1, DIS3L2, EGFR (c.2369C>T, p.Thr790Met variant only), EPCAM (Deletion/duplication testing only), FH, FLCN, GATA2, GPC3, GREM1 (Promoter region deletion/duplication testing only), HOXB13 (c.251G>A, p.Gly84Glu), HRAS, KIT, MAX, MEN1, MET, MITF (c.952G>A, p.Glu318Lys variant only), MLH1, MSH2, MSH3, MSH6, MUTYH, NBN, NF1, NF2, NTHL1, PALB2, PDGFRA, PHOX2B, PMS2, POLD1, POLE, POT1, PRKAR1A, PTCH1, PTEN, RAD50, RAD51C, RAD51D, RB1, RECQL4, RET, RUNX1, SDHAF2, SDHA (sequence changes only), SDHB, SDHC, SDHD, SMAD4, SMARCA4, SMARCB1, SMARCE1, STK11, SUFU, TERT, TERT, TMEM127, TP53, TSC1, TSC2, VHL, WRN and WT1.  The report date is January 19, 2018.   06/13/2018 Breast MRI   Significant improvement of left breast cancer previously 2.4 x 3.2 x 4 cm, currently 1.4 x 1.3 x 0.8 cm, no lymph nodes   07/02/2018 Surgery   Bilateral mastectomies: Left mastectomy: IDC grade 2, 0.9 cm, margins negative, 1/4 lymph nodes positive with extracapsular extension, margins negative ypT1b ypN1 ER 75% strong, PR 70% strong, HER-2 negative, Ki-67 60%   07/24/2018 Surgery   Left axillary lymph node dissection: 0/4 lymph nodes   09/07/2018 - 10/22/2018 Radiation Therapy   Adjuvant radiation   10/2018 -  Anti-estrogen oral therapy   Zoladex and Anastrozole     REVIEW OF SYSTEMS:   Constitutional: Denies fevers, chills or abnormal weight loss Eyes: Denies blurriness of vision Ears, nose, mouth, throat, and face: Denies mucositis or sore throat Respiratory: Denies cough, dyspnea or wheezes Cardiovascular: Denies palpitation, chest discomfort Gastrointestinal:  Denies nausea, heartburn or change in bowel habits Skin: Denies abnormal skin rashes Lymphatics: Denies new lymphadenopathy  or  easy bruising Neurological:Denies numbness, tingling or new weaknesses Behavioral/Psych: Mood is stable, no new changes  Extremities: No lower extremity edema Breast: denies any pain or lumps or nodules in either breasts All other systems were reviewed with the patient and are negative.  Observations/Objective:  There were no vitals filed for this visit. There is no height or weight on file to calculate BMI.  I have reviewed the data as listed CMP Latest Ref Rng & Units 07/31/2018 07/24/2018 06/18/2018  Glucose 70 - 99 mg/dL 107(H) 72 103(H)  BUN 6 - 20 mg/dL 11 8 11   Creatinine 0.44 - 1.00 mg/dL 0.79 0.74 0.76  Sodium 135 - 145 mmol/L 141 142 142  Potassium 3.5 - 5.1 mmol/L 3.5 3.5 3.7  Chloride 98 - 111 mmol/L 104 105 106  CO2 22 - 32 mmol/L 23 26 27   Calcium 8.9 - 10.3 mg/dL 10.3 9.5 9.6  Total Protein 6.5 - 8.1 g/dL 7.6 - 7.1  Total Bilirubin 0.3 - 1.2 mg/dL 1.0 - 0.3  Alkaline Phos 38 - 126 U/L 76 - 79  AST 15 - 41 U/L 25 - 33  ALT 0 - 44 U/L 28 - 93(H)    Lab Results  Component Value Date   WBC 11.8 (H) 07/31/2018   HGB 13.6 07/31/2018   HCT 41.3 07/31/2018   MCV 88.8 07/31/2018   PLT 457 (H) 07/31/2018   NEUTROABS 2.9 06/18/2018      Assessment Plan:  Malignant neoplasm of upper-outer quadrant of left breast in female, estrogen receptor positive ([place]) 01/02/2018: Palpable lump left breastsince December 2018, mammogram: Left breast UOQ 2.3 cm mass, axilla has several enlarged lymph nodes; 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  Status post neoadjuvant chemotherapy with dose dense Adriamycin Cytoxan x4 followed by Abraxane weekly x12  07/02/2018:Bilateral mastectomies: Left mastectomy: IDC grade 2, 0.9 cm, margins negative, 1/4 lymph nodes positive with extracapsular extension, margins negative ypT1b ypN1 ER 75% strong, PR 10% strong, HER-2 negative, Ki-67 60%  07/23/2018: Left axillary lymph node dissection: 0/4 LN Adjuvant radiation therapy 09/07/2018-10/23/2018 ----------------------------------------------------------------------------------------------------------------------------------- [name] clinical trial: Phase 3 clinical trial in patients or estrogen receptor positive randomized between standard antiestrogen therapy versus antiestrogen therapy with ribociclib 400 mg for 36 months.   Ribociclib: I discussed the risks and benefits of Ribociclib including myelosuppression especially neutropenia and with that risk of infection, there is risk of pulmonary embolism and mild peripheral neuropathy as well. Fatigue, nausea, diarrhea, decreased appetite as well as alopecia and thrombocytopenia are also potential side effects of Ribociclib.  Patient is very interested in the trial and I informed research department to contact her for consenting.  Follow-up visits based upon the trial requirements.    I discussed the assessment and treatment plan with the patient. The patient was provided an opportunity to ask questions and all were answered. The patient agreed with the plan and demonstrated an understanding of the instructions. The patient was advised to call back or seek an in-person evaluation if the symptoms worsen or if the condition fails to improve as anticipated.   I provided 15 minutes of face-to-face Doximity time during this encounter.    Rulon Eisenmenger, MD 01/28/2019   I, Molly Dorshimer, am acting as scribe for Nicholas Lose, MD.  I have reviewed the above documentation for accuracy and completeness, and I agree with the above.

## 2019-01-28 ENCOUNTER — Inpatient Hospital Stay: Payer: Commercial Managed Care - PPO | Attending: Hematology and Oncology | Admitting: Hematology and Oncology

## 2019-01-28 DIAGNOSIS — C773 Secondary and unspecified malignant neoplasm of axilla and upper limb lymph nodes: Secondary | ICD-10-CM | POA: Insufficient documentation

## 2019-01-28 DIAGNOSIS — Z9013 Acquired absence of bilateral breasts and nipples: Secondary | ICD-10-CM | POA: Diagnosis not present

## 2019-01-28 DIAGNOSIS — Z170 Estrogen receptor positive status [ER+]: Secondary | ICD-10-CM | POA: Insufficient documentation

## 2019-01-28 DIAGNOSIS — Z79899 Other long term (current) drug therapy: Secondary | ICD-10-CM

## 2019-01-28 DIAGNOSIS — C50412 Malignant neoplasm of upper-outer quadrant of left female breast: Secondary | ICD-10-CM | POA: Diagnosis not present

## 2019-01-28 DIAGNOSIS — Z923 Personal history of irradiation: Secondary | ICD-10-CM

## 2019-01-28 DIAGNOSIS — Z79811 Long term (current) use of aromatase inhibitors: Secondary | ICD-10-CM

## 2019-01-28 DIAGNOSIS — Z006 Encounter for examination for normal comparison and control in clinical research program: Secondary | ICD-10-CM | POA: Insufficient documentation

## 2019-01-28 DIAGNOSIS — Z9221 Personal history of antineoplastic chemotherapy: Secondary | ICD-10-CM | POA: Insufficient documentation

## 2019-01-28 NOTE — Assessment & Plan Note (Signed)
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  Status post neoadjuvant chemotherapy with dose dense Adriamycin Cytoxan x4 followed by Abraxane weekly x12  07/02/2018:Bilateral mastectomies: Left mastectomy: IDC grade 2, 0.9 cm, margins negative, 1/4 lymph nodes positive with extracapsular extension, margins negative ypT1b ypN1 ER 75% strong, PR 10% strong, HER-2 negative, Ki-67 60% 07/23/2018: Left axillary lymph node dissection: 0/4 LN Adjuvant radiation therapy 09/07/2018-10/23/2018 ----------------------------------------------------------------------------------------------------------------------------------- [name] clinical trial: Phase 3 clinical trial in patients or estrogen receptor positive randomized between standard antiestrogen therapy versus antiestrogen therapy with ribociclib 400 mg for 36 months.   Ribociclib: I discussed the risks and benefits of Ribociclib including myelosuppression especially neutropenia and with that risk of infection, there is risk of pulmonary embolism and mild peripheral neuropathy as well. Fatigue, nausea, diarrhea, decreased appetite as well as alopecia and thrombocytopenia are also potential side effects of Ribociclib.  Patient is very interested in the trial and I informed research department to contact her for consenting.  Follow-up visits based upon the trial requirements.

## 2019-01-29 ENCOUNTER — Telehealth: Payer: Self-pay

## 2019-01-29 ENCOUNTER — Other Ambulatory Visit: Payer: Self-pay

## 2019-01-29 ENCOUNTER — Inpatient Hospital Stay: Payer: Commercial Managed Care - PPO

## 2019-01-29 DIAGNOSIS — C50412 Malignant neoplasm of upper-outer quadrant of left female breast: Secondary | ICD-10-CM

## 2019-01-29 DIAGNOSIS — Z170 Estrogen receptor positive status [ER+]: Secondary | ICD-10-CM

## 2019-01-29 NOTE — Telephone Encounter (Signed)
Called patient to confirm that she could make that the appointments scheduled for her worked with her schedule. Patient said they did. Confirmed with patient that she would fast 8 hours prior to lab appt.  Johney Maine RN, BSN Clinical Research  01/29/19 5:39 PM

## 2019-01-29 NOTE — Telephone Encounter (Signed)
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 Pt had spoken with Dr. Lindi Adie earlier this morning regarding the [name] trial. Patient is interested in learning more. She agreed to come in on 01/29/19 to review the trial with this research RN. Thanked patient for her time and interest. Betsy Coder, BSN Clinical Research

## 2019-01-29 NOTE — Progress Notes (Addendum)
Novartis/TRIO033 [name] Clinical Trial  Patient arrived unaccompanied to clinic to discuss participation in the [place] clinical trial. The Informed Consent Form, version 3.0, was reviewed with patient in its entirety. This RN explained the purpose of the study and the  possible benefits and risks. Discussed the study procedures and tests and the study timeline. Patient is aware that she is expected to comply with the scheduled visits, tests and other trial procedures. Patient is aware and agrees to avoid consumption of grapefruit, grapefruit hybrids, pummelos, star fruit, [place] oranges, and products containing the juice of each for 7 days prior to date of randomization and during treatment if randomized to the Ribociclib arm of the study. Pt is aware and understands that she must be willing to use highly effective contraception such as abstinence during the trial treatment. Patient is scheduled for a bilateral oophorectomy in August and understands she will have to use another highly effective contraception until her menopausal status is confirmed after her surgery. This RN, research RN Otilio Miu, and the patient spent 45 minutes reviewing the informed consent. All the patient's questions were answered and the patient agreed to proceed with participation in the trial. She understands that signing the ICF allows [place] to proceed with the screening process and confirm that she is eligible for the study. The patient signed and dated the consent as well as consent for the optional pharmacogenetics studies and the optional consent for additional research using personal data and biologic samples. Copy of the consent will be given to her when she comes in for her screening visit. The patient then completed the questionnaires. This RN reviewed the questionnaires for completeness. This RN and the patient then went to an exam room and completed the screening ECG required for the study. Patient rested for 10 minutes  prior to ECG. Reviewed patient's medications and past medical history. Per pathology report on 09/03/2018, patient has chronic inactive gastritis. This required no further follow-up or treatment and does not meet the criteria for exclusion criteria #15 in [name] protocol. Patient was also referred to cardiology on 04/02/2018 for dyspnea, chest pain and tachycardia. Please see encounters from 04/02/18 and 05/07/18. This was determined to be a reaction to Taxol. Patient was switched from Taxol to Abraxane. Symptoms resolved and there were no further interventions by cardiology. This RN confirmed that none of her medications are prohibited by the study. Patient will return to the clinic for a physical exam and screening labs. This RN will call patient once she gets the appointments scheduled. Thanked patient for her time and interest. Told her to call this RN or Dr. Lindi Adie with any questions.  Johney Maine RN, BSN Clinical Research  01/29/19 3:25 PM

## 2019-01-30 ENCOUNTER — Encounter: Payer: Self-pay | Admitting: *Deleted

## 2019-01-30 ENCOUNTER — Other Ambulatory Visit: Payer: Self-pay | Admitting: Medical Oncology

## 2019-01-30 ENCOUNTER — Other Ambulatory Visit: Payer: Self-pay

## 2019-01-30 ENCOUNTER — Inpatient Hospital Stay: Payer: Commercial Managed Care - PPO

## 2019-01-30 ENCOUNTER — Inpatient Hospital Stay (HOSPITAL_BASED_OUTPATIENT_CLINIC_OR_DEPARTMENT_OTHER): Payer: Commercial Managed Care - PPO | Admitting: Hematology and Oncology

## 2019-01-30 DIAGNOSIS — C50412 Malignant neoplasm of upper-outer quadrant of left female breast: Secondary | ICD-10-CM

## 2019-01-30 DIAGNOSIS — C773 Secondary and unspecified malignant neoplasm of axilla and upper limb lymph nodes: Secondary | ICD-10-CM | POA: Diagnosis not present

## 2019-01-30 DIAGNOSIS — Z9221 Personal history of antineoplastic chemotherapy: Secondary | ICD-10-CM | POA: Diagnosis not present

## 2019-01-30 DIAGNOSIS — Z170 Estrogen receptor positive status [ER+]: Secondary | ICD-10-CM

## 2019-01-30 DIAGNOSIS — Z79811 Long term (current) use of aromatase inhibitors: Secondary | ICD-10-CM | POA: Diagnosis not present

## 2019-01-30 DIAGNOSIS — Z9013 Acquired absence of bilateral breasts and nipples: Secondary | ICD-10-CM | POA: Diagnosis not present

## 2019-01-30 DIAGNOSIS — Z006 Encounter for examination for normal comparison and control in clinical research program: Secondary | ICD-10-CM

## 2019-01-30 DIAGNOSIS — Z923 Personal history of irradiation: Secondary | ICD-10-CM | POA: Diagnosis not present

## 2019-01-30 LAB — CBC WITH DIFFERENTIAL (CANCER CENTER ONLY)
Abs Immature Granulocytes: 0.01 10*3/uL (ref 0.00–0.07)
Basophils Absolute: 0 10*3/uL (ref 0.0–0.1)
Basophils Relative: 1 %
Eosinophils Absolute: 0.2 10*3/uL (ref 0.0–0.5)
Eosinophils Relative: 4 %
HCT: 45.1 % (ref 36.0–46.0)
Hemoglobin: 14.8 g/dL (ref 12.0–15.0)
Immature Granulocytes: 0 %
Lymphocytes Relative: 30 %
Lymphs Abs: 1.3 10*3/uL (ref 0.7–4.0)
MCH: 29.6 pg (ref 26.0–34.0)
MCHC: 32.8 g/dL (ref 30.0–36.0)
MCV: 90.2 fL (ref 80.0–100.0)
Monocytes Absolute: 0.3 10*3/uL (ref 0.1–1.0)
Monocytes Relative: 6 %
Neutro Abs: 2.6 10*3/uL (ref 1.7–7.7)
Neutrophils Relative %: 59 %
Platelet Count: 278 10*3/uL (ref 150–400)
RBC: 5 MIL/uL (ref 3.87–5.11)
RDW: 12 % (ref 11.5–15.5)
WBC Count: 4.4 10*3/uL (ref 4.0–10.5)
nRBC: 0 % (ref 0.0–0.2)

## 2019-01-30 LAB — RESEARCH LABS

## 2019-01-30 LAB — CMP (CANCER CENTER ONLY)
ALT: 27 U/L (ref 0–44)
AST: 22 U/L (ref 15–41)
Albumin: 4.4 g/dL (ref 3.5–5.0)
Alkaline Phosphatase: 97 U/L (ref 38–126)
Anion gap: 10 (ref 5–15)
BUN: 9 mg/dL (ref 6–20)
CO2: 24 mmol/L (ref 22–32)
Calcium: 9.3 mg/dL (ref 8.9–10.3)
Chloride: 103 mmol/L (ref 98–111)
Creatinine: 0.75 mg/dL (ref 0.44–1.00)
GFR, Est AFR Am: >60 mL/min (ref >60)
GFR, Estimated: >60 mL/min (ref >60)
Glucose, Bld: 80 mg/dL (ref 70–99)
Potassium: 3.7 mmol/L (ref 3.5–5.1)
Sodium: 137 mmol/L (ref 135–145)
Total Bilirubin: 0.5 mg/dL (ref 0.3–1.2)
Total Protein: 7.6 g/dL (ref 6.5–8.1)

## 2019-01-30 LAB — URIC ACID: Uric Acid, Serum: 4.3 mg/dL (ref 2.5–7.1)

## 2019-01-30 LAB — GAMMA GT: GGT: 18 U/L (ref 7–50)

## 2019-01-30 LAB — BILIRUBIN, DIRECT: Bilirubin, Direct: 0.1 mg/dL (ref 0.0–0.2)

## 2019-01-30 LAB — PROTIME-INR
INR: 1 (ref 0.8–1.2)
Prothrombin Time: 13.3 seconds (ref 11.4–15.2)

## 2019-01-30 LAB — LACTATE DEHYDROGENASE: LDH: 159 U/L (ref 98–192)

## 2019-01-30 LAB — AMYLASE: Amylase: 82 U/L (ref 28–100)

## 2019-01-30 LAB — APTT: aPTT: 28 seconds (ref 24–36)

## 2019-01-30 LAB — HCG, SERUM, QUALITATIVE: Preg, Serum: NEGATIVE

## 2019-01-30 LAB — PHOSPHORUS: Phosphorus: 3.9 mg/dL (ref 2.5–4.6)

## 2019-01-30 LAB — MAGNESIUM: Magnesium: 2.1 mg/dL (ref 1.7–2.4)

## 2019-01-30 LAB — LIPASE, BLOOD: Lipase: 51 U/L (ref 11–51)

## 2019-01-30 MED ORDER — GOSERELIN ACETATE 3.6 MG ~~LOC~~ IMPL
DRUG_IMPLANT | SUBCUTANEOUS | Status: AC
  Filled 2019-01-30: qty 3.6

## 2019-01-30 MED ORDER — GOSERELIN ACETATE 3.6 MG ~~LOC~~ IMPL
3.6000 mg | DRUG_IMPLANT | Freq: Once | SUBCUTANEOUS | Status: AC
  Administered 2019-01-30: 3.6 mg via SUBCUTANEOUS

## 2019-01-30 NOTE — Assessment & Plan Note (Signed)
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  Status post neoadjuvant chemotherapy with dose dense Adriamycin Cytoxan x4 followed by Abraxane weekly x12  07/02/2018:Bilateral mastectomies: Left mastectomy: IDC grade 2, 0.9 cm, margins negative, 1/4 lymph nodes positive with extracapsular extension, margins negative ypT1b ypN1 ER 75% strong, PR 10% strong, HER-2 negative, Ki-67 60% 07/23/2018: Left axillary lymph node dissection: 0/4 LN Adjuvant radiation therapy 09/07/2018-10/23/2018 ----------------------------------------------------------------------------------------------------------------------------------- [name] clinical trial: Phase 3 clinical trial in patients or estrogen receptor positive randomized between standard antiestrogen therapy versus antiestrogen therapy with ribociclib 400 mg for 36 months.   Ribociclib: I discussed the risks and benefits of Ribociclib including myelosuppression especially neutropenia and with that risk of infection, there is risk of pulmonary embolism and mild peripheral neuropathy as well. Fatigue, nausea, diarrhea, decreased appetite as well as alopecia and thrombocytopenia are also potential side effects of Ribociclib.  Patient signed the consent form today. There is no clinical evidence of disease recurrence. Patient does not have any further signs or symptoms of prior chemo toxicities. Labs have been drawn today.  Patient plans to undergo oophorectomy in August.  After the oophorectomy we can discontinue Zoladex injections. She plans to undergo breast reconstruction in September.  Return to clinic based upon the clinical trial enrollment requirements.

## 2019-01-30 NOTE — Progress Notes (Signed)
Per Dr. Lindi Adie okay for Injection, Zoladex on 01/30/2019.

## 2019-01-30 NOTE — Progress Notes (Signed)
Copy of signed Consent Form for the [facility] study given to Dr. Lindi Adie to give to patient as he was going into exam room to see patient this afternoon. [name], BSN, RN Clinical Research Nurse 01/30/2019 2:22 PM

## 2019-01-30 NOTE — Patient Instructions (Signed)
Goserelin injection What is this medicine? GOSERELIN (GOE se rel in) is similar to a hormone found in the body. It lowers the amount of sex hormones that the body makes. Men will have lower testosterone levels and women will have lower estrogen levels while taking this medicine. In men, this medicine is used to treat prostate cancer; the injection is either given once per month or once every 12 weeks. A once per month injection (only) is used to treat women with endometriosis, dysfunctional uterine bleeding, or advanced breast cancer. This medicine may be used for other purposes; ask your health care provider or pharmacist if you have questions. COMMON BRAND NAME(S): Zoladex What should I tell my health care provider before I take this medicine? They need to know if you have any of these conditions (some only apply to women): -diabetes -heart disease or previous heart attack -high blood pressure -high cholesterol -kidney disease -osteoporosis or low bone density -problems passing urine -spinal cord injury -stroke -tobacco smoker -an unusual or allergic reaction to goserelin, hormone therapy, other medicines, foods, dyes, or preservatives -pregnant or trying to get pregnant -breast-feeding How should I use this medicine? This medicine is for injection under the skin. It is given by a health care professional in a hospital or clinic setting. Men receive this injection once every 4 weeks or once every 12 weeks. Women will only receive the once every 4 weeks injection. Talk to your pediatrician regarding the use of this medicine in children. Special care may be needed. Overdosage: If you think you have taken too much of this medicine contact a poison control center or emergency room at once. NOTE: This medicine is only for you. Do not share this medicine with others. What if I miss a dose? It is important not to miss your dose. Call your doctor or health care professional if you are unable to  keep an appointment. What may interact with this medicine? -female hormones like estrogen -herbal or dietary supplements like black cohosh, chasteberry, or DHEA -female hormones like testosterone -prasterone This list may not describe all possible interactions. Give your health care provider a list of all the medicines, herbs, non-prescription drugs, or dietary supplements you use. Also tell them if you smoke, drink alcohol, or use illegal drugs. Some items may interact with your medicine. What should I watch for while using this medicine? Visit your doctor or health care professional for regular checks on your progress. Your symptoms may appear to get worse during the first weeks of this therapy. Tell your doctor or healthcare professional if your symptoms do not start to get better or if they get worse after this time. Your bones may get weaker if you take this medicine for a long time. If you smoke or frequently drink alcohol you may increase your risk of bone loss. A family history of osteoporosis, chronic use of drugs for seizures (convulsions), or corticosteroids can also increase your risk of bone loss. Talk to your doctor about how to keep your bones strong. This medicine should stop regular monthly menstration in women. Tell your doctor if you continue to menstrate. Women should not become pregnant while taking this medicine or for 12 weeks after stopping this medicine. Women should inform their doctor if they wish to become pregnant or think they might be pregnant. There is a potential for serious side effects to an unborn child. Talk to your health care professional or pharmacist for more information. Do not breast-feed an infant while taking   this medicine. Men should inform their doctors if they wish to father a child. This medicine may lower sperm counts. Talk to your health care professional or pharmacist for more information. What side effects may I notice from receiving this  medicine? Side effects that you should report to your doctor or health care professional as soon as possible: -allergic reactions like skin rash, itching or hives, swelling of the face, lips, or tongue -bone pain -breathing problems -changes in vision -chest pain -feeling faint or lightheaded, falls -fever, chills -pain, swelling, warmth in the leg -pain, tingling, numbness in the hands or feet -signs and symptoms of low blood pressure like dizziness; feeling faint or lightheaded, falls; unusually weak or tired -stomach pain -swelling of the ankles, feet, hands -trouble passing urine or change in the amount of urine -unusually high or low blood pressure -unusually weak or tired Side effects that usually do not require medical attention (report to your doctor or health care professional if they continue or are bothersome): -change in sex drive or performance -changes in breast size in both males and females -changes in emotions or moods -headache -hot flashes -irritation at site where injected -loss of appetite -skin problems like acne, dry skin -vaginal dryness This list may not describe all possible side effects. Call your doctor for medical advice about side effects. You may report side effects to FDA at 1-800-FDA-1088. Where should I keep my medicine? This drug is given in a hospital or clinic and will not be stored at home. NOTE: This sheet is a summary. It may not cover all possible information. If you have questions about this medicine, talk to your doctor, pharmacist, or health care provider.  2019 Elsevier/Gold Standard (2013-09-17 11:10:35)  

## 2019-01-30 NOTE — Progress Notes (Signed)
Patient Care Team: Kristi Heys, PA-C as PCP - General (Physician Assistant) Nicholas Lose, MD as Consulting Physician (Hematology and Oncology) Rolm Bookbinder, MD as Consulting Physician (General Surgery) Kristi Bison [name], NP as Nurse Practitioner (Hematology and Oncology) Eppie Gibson, MD as Attending Physician (Radiation Oncology)  DIAGNOSIS:  Encounter Diagnosis  Name Primary?  . Malignant neoplasm of upper-outer quadrant of left breast in female, estrogen receptor positive ([place])     SUMMARY OF ONCOLOGIC HISTORY: Oncology History  Malignant neoplasm of upper-outer quadrant of left breast in female, estrogen receptor positive ([place])  01/02/2018 Initial Diagnosis   Palpable lump left breast, mammogram: Left breast UOQ 2.3 cm mass, axilla has several enlarged lymph nodes; 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   01/04/2018 Cancer Staging   Staging form: Breast, AJCC 8th Edition - Clinical: Stage IIB (cT2, cN1, cM0, G3, ER+, PR+, HER2: Equivocal) - Signed by Nicholas Lose, MD on 01/04/2018   01/16/2018 - 06/18/2018 Neo-Adjuvant Chemotherapy   Dose dense Adriamycin and Cytoxan x4 followed by Taxol, switched to Abraxane with cycle 2 after Taxol reaction with palpitations weekly x12   01/19/2018 Genetic Testing   STK11 c.608C>T VUS identified on the Multicancer panel.  The Multi-Gene Panel offered by [facility] includes sequencing and/or deletion duplication testing of the following 83 genes: ALK, APC, ATM, AXIN2,BAP1,  BARD1, BLM, BMPR1A, BRCA1, BRCA2, BRIP1, CASR, CDC73, CDH1, CDK4, CDKN1B, CDKN1C, CDKN2A (p14ARF), CDKN2A (p16INK4a), CEBPA, CHEK2, CTNNA1, DICER1, DIS3L2, EGFR (c.2369C>T, p.Thr790Met variant only), EPCAM (Deletion/duplication testing only), FH, FLCN, GATA2, GPC3, GREM1 (Promoter region deletion/duplication testing only), HOXB13 (c.251G>A, p.Gly84Glu), HRAS,  KIT, MAX, MEN1, MET, MITF (c.952G>A, p.Glu318Lys variant only), MLH1, MSH2, MSH3, MSH6, MUTYH, NBN, NF1, NF2, NTHL1, PALB2, PDGFRA, PHOX2B, PMS2, POLD1, POLE, POT1, PRKAR1A, PTCH1, PTEN, RAD50, RAD51C, RAD51D, RB1, RECQL4, RET, RUNX1, SDHAF2, SDHA (sequence changes only), SDHB, SDHC, SDHD, SMAD4, SMARCA4, SMARCB1, SMARCE1, STK11, SUFU, TERT, TERT, TMEM127, TP53, TSC1, TSC2, VHL, WRN and WT1.  The report date is January 19, 2018.   06/13/2018 Breast MRI   Significant improvement of left breast cancer previously 2.4 x 3.2 x 4 cm, currently 1.4 x 1.3 x 0.8 cm, no lymph nodes   07/02/2018 Surgery   Bilateral mastectomies: Left mastectomy: IDC grade 2, 0.9 cm, margins negative, 1/4 lymph nodes positive with extracapsular extension, margins negative ypT1b ypN1 ER 75% strong, PR 70% strong, HER-2 negative, Ki-67 60%   07/24/2018 Surgery   Left axillary lymph node dissection: 0/4 lymph nodes   09/07/2018 - 10/22/2018 Radiation Therapy   Adjuvant radiation   10/2018 -  Anti-estrogen oral therapy   Zoladex and Anastrozole     CHIEF COMPLIANT: Follow-up evaluation to discuss participation in [place] clinical trial, patient signed consent  INTERVAL HISTORY: Kristi [name] is a 43 year old with above-mentioned history of bilateral mastectomies for a left breast cancer who is currently on adjuvant antiestrogen therapy with Zoladex and anastrozole.  She is tolerating the combination of these drugs extremely well.  She did have hot flashes for which we started her on Effexor and it appears to be working.  She does not have any significant amount of hot flashes.  She is tolerating it fairly well.  She does not have any myalgias or arthralgias.  She does not have any residual side effects from prior chemotherapy.  Denies any neuropathy.  Denies any nausea vomiting.  She has mild constipation since taking Effexor  and she will take a stool softeners for that.  REVIEW OF SYSTEMS:   Constitutional: Denies  fevers, chills or abnormal weight loss Eyes: Denies blurriness of vision Ears, nose, mouth, throat, and face: Denies mucositis or sore throat Respiratory: Denies cough, dyspnea or wheezes Cardiovascular: Denies palpitation, chest discomfort Gastrointestinal: Occasional constipation Skin: Denies abnormal skin rashes Lymphatics: Denies new lymphadenopathy or easy bruising Neurological:Denies numbness, tingling or new weaknesses Behavioral/Psych: Mood is stable, no new changes  Extremities: No lower extremity edema Breast: Bilateral mastectomies with reconstruction All other systems were reviewed with the patient and are negative.  I have reviewed the past medical history, past surgical history, social history and family history with the patient and they are unchanged from previous note.  ALLERGIES:  is allergic to sulfa antibiotics; chlorhexidine gluconate; and other.  MEDICATIONS:  Current Outpatient Medications  Medication Sig Dispense Refill  . anastrozole (ARIMIDEX) 1 MG tablet Take 1 tablet (1 mg total) by mouth daily. 90 tablet 3  . baclofen (LIORESAL) 10 MG tablet Take by mouth.     Marland Kitchen buPROPion (WELLBUTRIN) 75 MG tablet Take 75 mg by mouth 2 (two) times daily.    Marland Kitchen eletriptan (RELPAX) 40 MG tablet Take 40 mg by mouth every 2 (two) hours as needed for migraine (max 2 doses per 24 hrs.).   3  . EMGALITY 120 MG/ML SOAJ Inject 120 mg into the skin every 30 (thirty) days.     . fexofenadine (ALLEGRA) 180 MG tablet Take 180 mg by mouth daily.    Marland Kitchen gabapentin (NEURONTIN) 300 MG capsule Take 1 capsule (300 mg total) by mouth at bedtime. 90 capsule 3  . Goserelin Acetate (ZOLADEX [place]) Inject 1 Dose into the skin every 30 (thirty) days.     Marland Kitchen LORazepam (ATIVAN) 1 MG tablet Take 1 tablet (1 mg total) by mouth 3 (three) times daily as needed (agitation/restlessness). (Patient not taking: Reported on 01/29/2019) 15 tablet 0  . Melatonin 5 MG TABS Take by mouth at bedtime.    . pantoprazole  (PROTONIX) 40 MG tablet Take 1 tablet (40 mg total) by mouth 2 (two) times daily. 60 tablet 0  . Soft Lens Products (REWETTING DROPS) SOLN Place 1 drop into both eyes 3 (three) times daily as needed (contact lenses.).    Marland Kitchen venlafaxine XR (EFFEXOR-XR) 37.5 MG 24 hr capsule Take 1 capsule (37.5 mg total) by mouth daily with breakfast. 30 capsule 0   Current Facility-Administered Medications  Medication Dose Route Frequency Provider Last Rate Last Dose  . 0.9 %  sodium chloride infusion  500 mL Intravenous Once Milus Banister, MD       Facility-Administered Medications Ordered in Other Visits  Medication Dose Route Frequency Provider Last Rate Last Dose  . sodium chloride flush (NS) 0.9 % injection 10 mL  10 mL Intravenous PRN Nicholas Lose, MD   10 mL at 04/13/18 1529    PHYSICAL EXAMINATION: ECOG PERFORMANCE STATUS: 1 - Symptomatic but completely ambulatory  Vitals:   01/30/19 1404  BP: 97/76  Pulse: 89  Resp: 17  Temp: 98.2 F (36.8 C)  SpO2: 100%   Filed Weights   01/30/19 1404  Weight: 140 lb 3.2 oz (63.6 kg)    GENERAL:alert, no distress and comfortable SKIN: skin color, texture, turgor are normal, no rashes or significant lesions EYES: normal, Conjunctiva are pink and non-injected, sclera clear OROPHARYNX:no exudate, no erythema and lips, buccal mucosa, and tongue normal  NECK: supple, thyroid normal size, non-tender, without nodularity  LYMPH:  no palpable lymphadenopathy in the cervical, axillary or inguinal LUNGS: clear to auscultation and percussion with normal breathing effort HEART: regular rate & rhythm and no murmurs and no lower extremity edema ABDOMEN:abdomen soft, non-tender and normal bowel sounds MUSCULOSKELETAL:no cyanosis of digits and no clubbing  NEURO: alert & oriented x 3 with fluent speech, no focal motor/sensory deficits EXTREMITIES: No lower extremity edema   LABORATORY DATA:  I have reviewed the data as listed CMP Latest Ref Rng & Units  07/31/2018 07/24/2018 06/18/2018  Glucose 70 - 99 mg/dL 107(H) 72 103(H)  BUN 6 - 20 mg/dL 11 8 11   Creatinine 0.44 - 1.00 mg/dL 0.79 0.74 0.76  Sodium 135 - 145 mmol/L 141 142 142  Potassium 3.5 - 5.1 mmol/L 3.5 3.5 3.7  Chloride 98 - 111 mmol/L 104 105 106  CO2 22 - 32 mmol/L 23 26 27   Calcium 8.9 - 10.3 mg/dL 10.3 9.5 9.6  Total Protein 6.5 - 8.1 g/dL 7.6 - 7.1  Total Bilirubin 0.3 - 1.2 mg/dL 1.0 - 0.3  Alkaline Phos 38 - 126 U/L 76 - 79  AST 15 - 41 U/L 25 - 33  ALT 0 - 44 U/L 28 - 93(H)    Lab Results  Component Value Date   WBC 4.4 01/30/2019   HGB 14.8 01/30/2019   HCT 45.1 01/30/2019   MCV 90.2 01/30/2019   PLT 278 01/30/2019   NEUTROABS 2.6 01/30/2019    ASSESSMENT & PLAN:  Malignant neoplasm of upper-outer quadrant of left breast in female, estrogen receptor positive ([place]) 01/02/2018: Palpable lump left breastsince December 2018, mammogram: Left breast UOQ 2.3 cm mass, axilla has several enlarged lymph nodes; 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  Status post neoadjuvant chemotherapy with dose dense Adriamycin Cytoxan x4 followed by Abraxane weekly x12  07/02/2018:Bilateral mastectomies: Left mastectomy: IDC grade 2, 0.9 cm, margins negative, 1/4 lymph nodes positive with extracapsular extension, margins negative ypT1b ypN1 ER 75% strong, PR 10% strong, HER-2 negative, Ki-67 60% 07/23/2018: Left axillary lymph node dissection: 0/4 LN Adjuvant radiation therapy 09/07/2018-10/23/2018 ----------------------------------------------------------------------------------------------------------------------------------- [name] clinical trial: Phase 3 clinical trial in patients or estrogen receptor positive randomized between standard antiestrogen therapy versus antiestrogen therapy with ribociclib 400 mg for 36 months.   Ribociclib: I discussed the risks and benefits of  Ribociclib including myelosuppression especially neutropenia and with that risk of infection, there is risk of pulmonary embolism and mild peripheral neuropathy as well. Fatigue, nausea, diarrhea, decreased appetite as well as alopecia and thrombocytopenia are also potential side effects of Ribociclib.  Patient signed the consent form today. There is no clinical evidence of disease recurrence. Patient does not have any further signs or symptoms of prior chemo toxicities. Labs have been drawn today.  Patient plans to undergo oophorectomy in August.  After the oophorectomy we can discontinue Zoladex injections. She plans to undergo breast reconstruction in September.  Return to clinic based upon the clinical trial enrollment requirements.    No orders of the defined types were placed in this encounter.  The patient has a good understanding of the overall plan. she agrees with it. she will call with any problems that may develop before the next visit here.   Harriette Ohara, MD 01/30/19

## 2019-01-31 ENCOUNTER — Telehealth: Payer: Self-pay

## 2019-01-31 ENCOUNTER — Telehealth: Payer: Self-pay | Admitting: Hematology and Oncology

## 2019-01-31 LAB — ESTRADIOL: Estradiol: <5 pg/mL

## 2019-01-31 LAB — FOLLICLE STIMULATING HORMONE: FSH: 6.4 m[IU]/mL

## 2019-01-31 NOTE — Telephone Encounter (Signed)
I talk with patient regarding removal of appointments

## 2019-01-31 NOTE — Telephone Encounter (Signed)
Called patient to update her. As soon as her eligibility for the [name] clinical trial is confirmed by a second party, she can be randomized. Told her that once she is randomized, this RN will call her. Johney Maine RN, BSN Clinical Research  01/31/19 8:50 AM

## 2019-02-01 ENCOUNTER — Other Ambulatory Visit: Payer: Self-pay | Admitting: *Deleted

## 2019-02-01 ENCOUNTER — Telehealth: Payer: Self-pay

## 2019-02-01 DIAGNOSIS — C50412 Malignant neoplasm of upper-outer quadrant of left female breast: Secondary | ICD-10-CM

## 2019-02-01 DIAGNOSIS — Z170 Estrogen receptor positive status [ER+]: Secondary | ICD-10-CM

## 2019-02-01 MED ORDER — VENLAFAXINE HCL ER 37.5 MG PO CP24: 37.5000 mg | ORAL_CAPSULE | Freq: Every day | ORAL | 3 refills | Status: DC

## 2019-02-01 NOTE — Progress Notes (Deleted)
[employer]artis/TRIO033 [name] Clinical Trial- Eligibility Criteria  Confirmed with [facility] and [facility] Pathology department that patient has sufficient tissue from her biopsy and surgical samples to submit. Prior to neo-adjuvant chemotherapy, patient was T2N1 and anatomic Stage IIB. Post neo-adjuvant treatment, patient was T1N1 and anatomic Stage IIA. Patient is scheduled for a bilateral oophorectomy in August. Patient was educated on trial accepted contraception methods and elected abstinence until her surgery and follow-up confirmation of her menopausal status.

## 2019-02-01 NOTE — Telephone Encounter (Signed)
Spoke with patient regarding [name] clinical trial. Asked patient if there was a day next that works best next week to come in for her [place] visit. This RN and patient agreed for patient to come in on 02/06/19 at 1000. Thanked patient for her time and participation. Johney Maine RN, BSN Clinical Research  02/01/19 1:01 PM

## 2019-02-04 ENCOUNTER — Ambulatory Visit: Payer: Commercial Managed Care - PPO

## 2019-02-04 ENCOUNTER — Encounter: Payer: Self-pay | Admitting: Adult Health

## 2019-02-04 ENCOUNTER — Encounter (INDEPENDENT_AMBULATORY_CARE_PROVIDER_SITE_OTHER): Payer: Self-pay

## 2019-02-04 ENCOUNTER — Telehealth: Payer: Self-pay | Admitting: Adult Health

## 2019-02-04 NOTE — Telephone Encounter (Signed)
Called patient about the AET study.  Patient had surveys discontinued for unknown reason.  I re instated the weekly adherence and symptom management surveys.  Will reach out to IT as to cause.  Patient will alert me if she continues to not receive notifications.  Wilber Bihari, NP

## 2019-02-06 ENCOUNTER — Other Ambulatory Visit: Payer: Self-pay

## 2019-02-06 ENCOUNTER — Inpatient Hospital Stay: Payer: Commercial Managed Care - PPO

## 2019-02-06 ENCOUNTER — Encounter: Payer: Self-pay | Admitting: Hematology and Oncology

## 2019-02-06 VITALS — BP 119/81 | HR 89

## 2019-02-06 DIAGNOSIS — Z170 Estrogen receptor positive status [ER+]: Secondary | ICD-10-CM

## 2019-02-06 DIAGNOSIS — C50412 Malignant neoplasm of upper-outer quadrant of left female breast: Secondary | ICD-10-CM

## 2019-02-06 MED ORDER — INV-RIBOCICLIB 200 MG TAB NATALEE TRIO003 STUDY: 400.0000 mg | ORAL_TABLET | Freq: Every day | ORAL | 0 refills | Status: DC

## 2019-02-06 NOTE — Progress Notes (Signed)
[name]/TRIO033 C1D1(Randomization) Visit  Randomization: Eligibility was double checked and confirmed by research RN, Tyrell Antonio. Dr. Lindi Adie agrees that patient meets all of the eligibility criteria and none of the exclusion criteria. Dr. Lindi Adie also confirmed that patient was a N1 prior to neoadjuvant chemotherapy, making her an anatomic Stage IIB prior to neoadjuvant treatment. Patient randomized in IRT and assigned to the investigational arm of ribociclib + ET. Dr. Lindi Adie and patient were notified.   Con Meds: Reviewed with patient concomitant medications. Explained to patient to notify research nurse prior to starting any new medications. Patient verbalized understanding.  Pre-dose ECG: ECG completed after patient rested comfortably for 10 minutes. Dr. Lindi Adie reviewed and signed ECG.  Study Drug: IRT assigned Kit # [id number]. Bottle dispensed by pharmacist. Double checked patient's name, study ID # and expiration date. Ribociclib and medication diary was then given to patient. Patient took ribociclib and anastrozole in clinic at 10:54 am. Instructed patient on how to complete diary for today and the rest of this cycle. Patient verbalized understanding.  Baseline AEs:  Event Grade Onset Date End Date Status  Allergic Rhinitis 2 2005  ongoing  Headache 1 2000  ongoing  Hot Flashes 2 12/2017  ongoing   Provided patient with this RN's business card. Told patient to call if she had any questions or concerns. Thanked her for her time and participation. Her C1D15 visit is scheduled for 02/20/19 at 0900. Her labs for this visit will be done on 07/27 or 07/28. A message has been sent to scheduling. Patient understands that her C1D15 visit requires an ECG 2 hours and 4 hours post-dose. Thanked patient for her time. Johney Maine RN, BSN Clinical Research  02/06/19 11:34 AM

## 2019-02-07 ENCOUNTER — Telehealth: Payer: Self-pay | Admitting: Hematology and Oncology

## 2019-02-07 NOTE — Telephone Encounter (Signed)
Scheduled appt per 7/15 sch message - unable to reach pt . Left message with appt date and time

## 2019-02-18 ENCOUNTER — Other Ambulatory Visit: Payer: Self-pay

## 2019-02-18 ENCOUNTER — Inpatient Hospital Stay: Payer: Commercial Managed Care - PPO

## 2019-02-18 ENCOUNTER — Telehealth: Payer: Self-pay

## 2019-02-18 ENCOUNTER — Other Ambulatory Visit: Payer: Commercial Managed Care - PPO

## 2019-02-18 DIAGNOSIS — C50412 Malignant neoplasm of upper-outer quadrant of left female breast: Secondary | ICD-10-CM

## 2019-02-18 DIAGNOSIS — Z170 Estrogen receptor positive status [ER+]: Secondary | ICD-10-CM

## 2019-02-18 LAB — CBC WITH DIFFERENTIAL (CANCER CENTER ONLY)
Abs Immature Granulocytes: 0.01 10*3/uL (ref 0.00–0.07)
Basophils Absolute: 0 10*3/uL (ref 0.0–0.1)
Basophils Relative: 1 %
Eosinophils Absolute: 0.2 10*3/uL (ref 0.0–0.5)
Eosinophils Relative: 6 %
HCT: 45.6 % (ref 36.0–46.0)
Hemoglobin: 14.8 g/dL (ref 12.0–15.0)
Immature Granulocytes: 0 %
Lymphocytes Relative: 31 %
Lymphs Abs: 1.1 10*3/uL (ref 0.7–4.0)
MCH: 29.5 pg (ref 26.0–34.0)
MCHC: 32.5 g/dL (ref 30.0–36.0)
MCV: 90.8 fL (ref 80.0–100.0)
Monocytes Absolute: 0.2 10*3/uL (ref 0.1–1.0)
Monocytes Relative: 6 %
Neutro Abs: 2.1 10*3/uL (ref 1.7–7.7)
Neutrophils Relative %: 56 %
Platelet Count: 297 10*3/uL (ref 150–400)
RBC: 5.02 MIL/uL (ref 3.87–5.11)
RDW: 12.4 % (ref 11.5–15.5)
WBC Count: 3.6 10*3/uL — ABNORMAL LOW (ref 4.0–10.5)
nRBC: 0 % (ref 0.0–0.2)

## 2019-02-18 LAB — CMP (CANCER CENTER ONLY)
ALT: 23 U/L (ref 0–44)
AST: 19 U/L (ref 15–41)
Albumin: 4.9 g/dL (ref 3.5–5.0)
Alkaline Phosphatase: 107 U/L (ref 38–126)
Anion gap: 10 (ref 5–15)
BUN: 10 mg/dL (ref 6–20)
CO2: 27 mmol/L (ref 22–32)
Calcium: 10 mg/dL (ref 8.9–10.3)
Chloride: 103 mmol/L (ref 98–111)
Creatinine: 0.99 mg/dL (ref 0.44–1.00)
GFR, Est AFR Am: >60 mL/min (ref >60)
GFR, Estimated: >60 mL/min (ref >60)
Glucose, Bld: 80 mg/dL (ref 70–99)
Potassium: 3.9 mmol/L (ref 3.5–5.1)
Sodium: 140 mmol/L (ref 135–145)
Total Bilirubin: 0.6 mg/dL (ref 0.3–1.2)
Total Protein: 8.4 g/dL — ABNORMAL HIGH (ref 6.5–8.1)

## 2019-02-18 LAB — GAMMA GT: GGT: 19 U/L (ref 7–50)

## 2019-02-18 LAB — LACTATE DEHYDROGENASE: LDH: 143 U/L (ref 98–192)

## 2019-02-18 LAB — URIC ACID: Uric Acid, Serum: 4.5 mg/dL (ref 2.5–7.1)

## 2019-02-18 LAB — BILIRUBIN, DIRECT: Bilirubin, Direct: 0.1 mg/dL (ref 0.0–0.2)

## 2019-02-18 LAB — AMYLASE: Amylase: 79 U/L (ref 28–100)

## 2019-02-18 LAB — MAGNESIUM: Magnesium: 2.4 mg/dL (ref 1.7–2.4)

## 2019-02-18 LAB — PHOSPHORUS: Phosphorus: 3.8 mg/dL (ref 2.5–4.6)

## 2019-02-18 LAB — RESEARCH LABS

## 2019-02-18 LAB — LIPASE, BLOOD: Lipase: 45 U/L (ref 11–51)

## 2019-02-18 NOTE — Telephone Encounter (Signed)
Patient returned call and confirmed she had fasted for 8 hours prior to her labs being drawn. Johney Maine RN, BSN Clinical Research  02/18/19 10:16 AM

## 2019-02-18 NOTE — Telephone Encounter (Signed)
Left VM with patient. Wanted to confirm with her that she was fasting when her labs were drawn. Asked patient to return call. Johney Maine RN, BSN Clinical Research  02/18/19 10:01 AM

## 2019-02-20 ENCOUNTER — Inpatient Hospital Stay: Payer: Commercial Managed Care - PPO

## 2019-02-20 ENCOUNTER — Other Ambulatory Visit: Payer: Self-pay

## 2019-02-20 VITALS — BP 114/73 | HR 90 | Temp 98.7°F | Wt 136.8 lb

## 2019-02-20 DIAGNOSIS — Z170 Estrogen receptor positive status [ER+]: Secondary | ICD-10-CM

## 2019-02-20 DIAGNOSIS — C50412 Malignant neoplasm of upper-outer quadrant of left female breast: Secondary | ICD-10-CM

## 2019-02-20 NOTE — Progress Notes (Signed)
[name]/TRIO033 C1D15 Visit  VS: Weight was taken with no shoes and no heavy clothing. Vital signs were obtained after sitting for 5 minutes.  Con Meds/AEs: Patient reports no medication changes. However, she is changing allergy medication from [place] to [place]. This RN confirmed that Xyzal is not a prohibited medication. Patient states that she is "doing good" and has had no side effects.   Pre-Dose ECG: ECG completed after patient rested in the supine position for 10 minutes. QTcF=404. Dr. Lindi Adie reviewed confirms ECG is not clinically significant and patient is ok to treat.  2 hours Post- Dose ECG: Patient rested in the supine position for 10 minutes prior to ECG. ECG was completed at 11:21am. QTcF= 427. Dr. Lindi Adie reviewed and signed ECG.  4 hours Post-Dose ECG: Patient rested in the supine position for 10 minutes prior to ECG. ECG was completed at 1313. QTcF= 407. Dr. Lindi Adie reviewed and signed ECG.   Study Drug: Checked patient's medication diary. Patient has not missed any doses and has 100% compliance. Patient took 400 mg ribociclib and 1mg  anastrozole in clinic at 9:11am.  AEs: Event Grade Onset Date End Date Attribution to Ribociclib Status  Allergic Rhinitis 2 2005  No Ongoing; baseline  Headache 1 2000  No Ongoing; basline  Hot Flashes 2 12/2017  No Ongoing; baseline  Decreased White Blood Cell Count 1 02/18/2019  Yes  New   Patient's next cycle begins on 03/06/19. However, patient is scheduled for surgery that day. Patient will complete her Cycle 2 Day 1 visit the week of 08/10 as planned, but per Dr. Geralyn Flash instructions will wait to start ribociclib until 03/13/19. Pt confirms understanding with this plan. Patient's next appt for C2D1 is scheduled for 08/10. Thanked patient for her time and participation. Told her to call with any questions. Johney Maine RN, BSN Clinical Research  02/20/19 1:44 PM

## 2019-02-25 ENCOUNTER — Encounter (INDEPENDENT_AMBULATORY_CARE_PROVIDER_SITE_OTHER): Payer: Self-pay

## 2019-02-25 NOTE — Assessment & Plan Note (Signed)
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  Status post neoadjuvant chemotherapy with dose dense Adriamycin Cytoxan x4 followed by Abraxane weekly x12  07/02/2018:Bilateral mastectomies: Left mastectomy: IDC grade 2, 0.9 cm, margins negative, 1/4 lymph nodes positive with extracapsular extension, margins negative ypT1b ypN1 ER 75% strong, PR 10% strong, HER-2 negative, Ki-67 60% 07/23/2018: Left axillary lymph node dissection: 0/4 LN Adjuvant radiation therapy 09/07/2018-10/23/2018 ----------------------------------------------------------------------------------------------------------------------------------- [name] clinical trial: Phase 3 clinical trial in patients or estrogen receptor positive randomized between standard antiestrogen therapy versus antiestrogen therapy with ribociclib 400 mg for 36 months.   Ribociclib Toxicities:  Patient plans to undergo oophorectomy in August.  After the oophorectomy we can discontinue Zoladex injections. She plans to undergo breast reconstruction in September.

## 2019-03-02 ENCOUNTER — Other Ambulatory Visit (HOSPITAL_COMMUNITY)
Admission: RE | Admit: 2019-03-02 | Discharge: 2019-03-02 | Disposition: A | Discharge status: Home / Self Care | Payer: Commercial Managed Care - PPO | Source: Ambulatory Visit | Attending: Hematology and Oncology | Admitting: Hematology and Oncology

## 2019-03-02 DIAGNOSIS — Z01812 Encounter for preprocedural laboratory examination: Secondary | ICD-10-CM | POA: Insufficient documentation

## 2019-03-02 DIAGNOSIS — Z20828 Contact with and (suspected) exposure to other viral communicable diseases: Secondary | ICD-10-CM | POA: Diagnosis not present

## 2019-03-03 LAB — SARS CORONAVIRUS 2 (TAT 6-24 HRS): SARS Coronavirus 2: NEGATIVE

## 2019-03-03 NOTE — Progress Notes (Signed)
Patient Care Team: Blair Heys, PA-C as PCP - General (Physician Assistant) Nicholas Lose, MD as Consulting Physician (Hematology and Oncology) Rolm Bookbinder, MD as Consulting Physician (General Surgery) Delice Bison, [name], NP as Nurse Practitioner (Hematology and Oncology) Eppie Gibson, MD as Attending Physician (Radiation Oncology)  DIAGNOSIS:    ICD-10-CM   1. Malignant neoplasm of upper-outer quadrant of left breast in female, estrogen receptor positive ([place])  C50.412    Z17.0     SUMMARY OF ONCOLOGIC HISTORY: Oncology History  Malignant neoplasm of upper-outer quadrant of left breast in female, estrogen receptor positive ([place])  01/02/2018 Initial Diagnosis   Palpable lump left breast, mammogram: Left breast UOQ 2.3 cm mass, axilla has several enlarged lymph nodes; 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   01/04/2018 Cancer Staging   Staging form: Breast, AJCC 8th Edition - Clinical: Stage IIB (cT2, cN1, cM0, G3, ER+, PR+, HER2: Equivocal) - Signed by Nicholas Lose, MD on 01/04/2018   01/16/2018 - 06/18/2018 Neo-Adjuvant Chemotherapy   Dose dense Adriamycin and Cytoxan x4 followed by Taxol, switched to Abraxane with cycle 2 after Taxol reaction with palpitations weekly x12   01/19/2018 Genetic Testing   STK11 c.608C>T VUS identified on the Multicancer panel.  The Multi-Gene Panel offered by [facility] includes sequencing and/or deletion duplication testing of the following 83 genes: ALK, APC, ATM, AXIN2,BAP1,  BARD1, BLM, BMPR1A, BRCA1, BRCA2, BRIP1, CASR, CDC73, CDH1, CDK4, CDKN1B, CDKN1C, CDKN2A (p14ARF), CDKN2A (p16INK4a), CEBPA, CHEK2, CTNNA1, DICER1, DIS3L2, EGFR (c.2369C>T, p.Thr790Met variant only), EPCAM (Deletion/duplication testing only), FH, FLCN, GATA2, GPC3, GREM1 (Promoter region deletion/duplication testing only), HOXB13 (c.251G>A, p.Gly84Glu), HRAS, KIT,  MAX, MEN1, MET, MITF (c.952G>A, p.Glu318Lys variant only), MLH1, MSH2, MSH3, MSH6, MUTYH, NBN, NF1, NF2, NTHL1, PALB2, PDGFRA, PHOX2B, PMS2, POLD1, POLE, POT1, PRKAR1A, PTCH1, PTEN, RAD50, RAD51C, RAD51D, RB1, RECQL4, RET, RUNX1, SDHAF2, SDHA (sequence changes only), SDHB, SDHC, SDHD, SMAD4, SMARCA4, SMARCB1, SMARCE1, STK11, SUFU, TERT, TERT, TMEM127, TP53, TSC1, TSC2, VHL, WRN and WT1.  The report date is January 19, 2018.   06/13/2018 Breast MRI   Significant improvement of left breast cancer previously 2.4 x 3.2 x 4 cm, currently 1.4 x 1.3 x 0.8 cm, no lymph nodes   07/02/2018 Surgery   Bilateral mastectomies: Left mastectomy: IDC grade 2, 0.9 cm, margins negative, 1/4 lymph nodes positive with extracapsular extension, margins negative ypT1b ypN1 ER 75% strong, PR 70% strong, HER-2 negative, Ki-67 60%   07/24/2018 Surgery   Left axillary lymph node dissection: 0/4 lymph nodes   09/07/2018 - 10/22/2018 Radiation Therapy   Adjuvant radiation   10/2018 -  Anti-estrogen oral therapy   Zoladex and Anastrozole   02/06/2019 -  Chemotherapy   The patient had [No matching medication found in this treatment plan]  for chemotherapy treatment.      CHIEF COMPLIANT: follow-up of left breast cancer on [name] clinical trial  INTERVAL HISTORY: Kristi [name] is a 43 y.o. with above-mentioned history of left breast cancer treated with neoadjuvant chemotherapy, bilateral mastectomies, radiation, and who is currently on adjuvant antiestrogen therapy with Zoladex and anastrozole. She is a participant in the [place] clinical trial and was randomized to the ribociclib arm. She presents to the clinic today for follow-up.   REVIEW OF SYSTEMS:   Constitutional: Denies fevers, chills or abnormal weight loss Eyes: Denies blurriness of vision Ears, nose, mouth, throat, and face: Denies mucositis or sore throat  Respiratory: Denies cough, dyspnea or wheezes Cardiovascular: Denies palpitation, chest  discomfort Gastrointestinal: Denies nausea, heartburn or change in bowel habits Skin: Denies abnormal skin rashes Lymphatics: Denies new lymphadenopathy or easy bruising Neurological: Denies numbness, tingling or new weaknesses Behavioral/Psych: Mood is stable, no new changes  Extremities: No lower extremity edema Breast: denies any pain or lumps or nodules in either breasts All other systems were reviewed with the patient and are negative.  I have reviewed the past medical history, past surgical history, social history and family history with the patient and they are unchanged from previous note.  ALLERGIES:  is allergic to sulfa antibiotics; chlorhexidine gluconate; and other.  MEDICATIONS:  Current Outpatient Medications  Medication Sig Dispense Refill   anastrozole (ARIMIDEX) 1 MG tablet Take 1 tablet (1 mg total) by mouth daily. 90 tablet 3   baclofen (LIORESAL) 10 MG tablet Take by mouth.      buPROPion (WELLBUTRIN) 75 MG tablet Take 75 mg by mouth 2 (two) times daily.     eletriptan (RELPAX) 40 MG tablet Take 40 mg by mouth every 2 (two) hours as needed for migraine (max 2 doses per 24 hrs.).   3   EMGALITY 120 MG/ML SOAJ Inject 120 mg into the skin every 30 (thirty) days.      fexofenadine (ALLEGRA) 180 MG tablet Take 180 mg by mouth daily.      gabapentin (NEURONTIN) 300 MG capsule Take 1 capsule (300 mg total) by mouth at bedtime. 90 capsule 3   Goserelin Acetate (ZOLADEX [place]) Inject 1 Dose into the skin every 30 (thirty) days.      Investigational ribociclib (KISQALI) 200 MG tablet [name] Take 2 tablets (400 mg total) by mouth daily. Take 2 tablets ('400mg'$  total) by mouth daily on days 1-21.  Repeat every 28 days. 75 tablet 0   levocetirizine (XYZAL) 5 MG tablet Take 5 mg by mouth every evening.     LORazepam (ATIVAN) 1 MG tablet Take 1 tablet (1 mg total) by mouth 3 (three) times daily as needed (agitation/restlessness). (Patient not taking: Reported on  01/29/2019) 15 tablet 0   Melatonin 5 MG TABS Take by mouth at bedtime.     pantoprazole (PROTONIX) 40 MG tablet Take 1 tablet (40 mg total) by mouth 2 (two) times daily. 60 tablet 0   Soft Lens Products (REWETTING DROPS) SOLN Place 1 drop into both eyes 3 (three) times daily as needed (contact lenses.).     venlafaxine XR (EFFEXOR-XR) 37.5 MG 24 hr capsule Take 1 capsule (37.5 mg total) by mouth daily with breakfast. 30 capsule 3   Current Facility-Administered Medications  Medication Dose Route Frequency Provider Last Rate Last Dose   0.9 %  sodium chloride infusion  500 mL Intravenous Once Milus Banister, MD       Facility-Administered Medications Ordered in Other Visits  Medication Dose Route Frequency Provider Last Rate Last Dose   sodium chloride flush (NS) 0.9 % injection 10 mL  10 mL Intravenous PRN Nicholas Lose, MD   10 mL at 04/13/18 1529    PHYSICAL EXAMINATION: ECOG PERFORMANCE STATUS: 1 - Symptomatic but completely ambulatory  There were no vitals filed for this visit. There were no vitals filed for this visit.  GENERAL: alert, no distress and comfortable SKIN: skin color, texture, turgor are normal, no rashes or significant lesions EYES: normal, Conjunctiva are pink and non-injected, sclera clear OROPHARYNX: no exudate, no erythema and lips, buccal mucosa, and tongue normal  NECK: supple,  thyroid normal size, non-tender, without nodularity LYMPH: no palpable lymphadenopathy in the cervical, axillary or inguinal LUNGS: clear to auscultation and percussion with normal breathing effort HEART: regular rate & rhythm and no murmurs and no lower extremity edema ABDOMEN: abdomen soft, non-tender and normal bowel sounds MUSCULOSKELETAL: no cyanosis of digits and no clubbing  NEURO: alert & oriented x 3 with fluent speech, no focal motor/sensory deficits EXTREMITIES: No lower extremity edema  LABORATORY DATA:  I have reviewed the data as listed CMP Latest Ref Rng &  Units 02/18/2019 01/30/2019 07/31/2018  Glucose 70 - 99 mg/dL 80 80 107(H)  BUN 6 - 20 mg/dL '10 9 11  '$ Creatinine 0.44 - 1.00 mg/dL 0.99 0.75 0.79  Sodium 135 - 145 mmol/L 140 137 141  Potassium 3.5 - 5.1 mmol/L 3.9 3.7 3.5  Chloride 98 - 111 mmol/L 103 103 104  CO2 22 - 32 mmol/L '27 24 23  '$ Calcium 8.9 - 10.3 mg/dL 10.0 9.3 10.3  Total Protein 6.5 - 8.1 g/dL 8.4(H) 7.6 7.6  Total Bilirubin 0.3 - 1.2 mg/dL 0.6 0.5 1.0  Alkaline Phos 38 - 126 U/L 107 97 76  AST 15 - 41 U/L '19 22 25  '$ ALT 0 - 44 U/L '23 27 28    '$ Lab Results  Component Value Date   WBC 2.7 (L) 03/04/2019   HGB 13.6 03/04/2019   HCT 40.8 03/04/2019   MCV 89.1 03/04/2019   PLT 252 03/04/2019   NEUTROABS 1.2 (L) 03/04/2019    ASSESSMENT & PLAN:  Malignant neoplasm of upper-outer quadrant of left breast in female, estrogen receptor positive ([place]) 01/02/2018: Palpable lump left breastsince December 2018, mammogram: Left breast UOQ 2.3 cm mass, axilla has several enlarged lymph nodes; 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  Status post neoadjuvant chemotherapy with dose dense Adriamycin Cytoxan x4 followed by Abraxane weekly x12  07/02/2018:Bilateral mastectomies: Left mastectomy: IDC grade 2, 0.9 cm, margins negative, 1/4 lymph nodes positive with extracapsular extension, margins negative ypT1b ypN1 ER 75% strong, PR 10% strong, HER-2 negative, Ki-67 60% 07/23/2018: Left axillary lymph node dissection: 0/4 LN Adjuvant radiation therapy 09/07/2018-10/23/2018 ----------------------------------------------------------------------------------------------------------------------------------- [name] clinical trial: Phase 3 clinical trial in patients or estrogen receptor positive randomized between standard antiestrogen therapy versus antiestrogen therapy with ribociclib 400 mg for 36 months.   Ribociclib Toxicities: 1.   Decreased neutrophil count: Today's ANC is 1.2.  By the clinical trial protocol we will continue with the same dosage. She is having bilateral salpingo-oophorectomy on 03/06/2019. She will resume her new treatment on 03/13/2019.  2.  Hot flashes: Related to anastrozole therapy.  She is currently on Effexor which is helping but is not completely make it better.  She rates his hot flashes is moderate she experiences 6-12 episodes per day.  They are short lasting.  She plans to undergo breast reconstruction surgery on 04/19/2019. We will have to hold ribociclib in anticipation of that surgery 1 week before and 1 week after.  Zoladex has been discontinued. Return to clinic 04/03/2019 for follow-up based on clinical trial requirement.  No orders of the defined types were placed in this encounter.  The patient has a good understanding of the overall plan. she agrees with it. she will call with any problems that may develop before the next visit here.  Nicholas Lose, MD 03/04/2019  Julious Oka Dorshimer am acting as scribe for Dr. Nicholas Lose.  I have reviewed the above documentation for accuracy and completeness, and I agree with the above.

## 2019-03-04 ENCOUNTER — Other Ambulatory Visit: Payer: Self-pay

## 2019-03-04 ENCOUNTER — Inpatient Hospital Stay: Payer: Commercial Managed Care - PPO

## 2019-03-04 ENCOUNTER — Inpatient Hospital Stay: Payer: Commercial Managed Care - PPO | Attending: Hematology and Oncology | Admitting: Hematology and Oncology

## 2019-03-04 ENCOUNTER — Encounter (HOSPITAL_BASED_OUTPATIENT_CLINIC_OR_DEPARTMENT_OTHER): Payer: Self-pay | Admitting: *Deleted

## 2019-03-04 ENCOUNTER — Telehealth: Payer: Self-pay | Admitting: Hematology and Oncology

## 2019-03-04 ENCOUNTER — Ambulatory Visit: Payer: Commercial Managed Care - PPO

## 2019-03-04 DIAGNOSIS — Z79811 Long term (current) use of aromatase inhibitors: Secondary | ICD-10-CM | POA: Insufficient documentation

## 2019-03-04 DIAGNOSIS — Z170 Estrogen receptor positive status [ER+]: Secondary | ICD-10-CM

## 2019-03-04 DIAGNOSIS — Z9013 Acquired absence of bilateral breasts and nipples: Secondary | ICD-10-CM | POA: Insufficient documentation

## 2019-03-04 DIAGNOSIS — N951 Menopausal and female climacteric states: Secondary | ICD-10-CM | POA: Insufficient documentation

## 2019-03-04 DIAGNOSIS — C50412 Malignant neoplasm of upper-outer quadrant of left female breast: Secondary | ICD-10-CM

## 2019-03-04 DIAGNOSIS — Z006 Encounter for examination for normal comparison and control in clinical research program: Secondary | ICD-10-CM

## 2019-03-04 DIAGNOSIS — Z923 Personal history of irradiation: Secondary | ICD-10-CM | POA: Insufficient documentation

## 2019-03-04 DIAGNOSIS — Z9221 Personal history of antineoplastic chemotherapy: Secondary | ICD-10-CM | POA: Insufficient documentation

## 2019-03-04 LAB — CMP (CANCER CENTER ONLY)
ALT: 21 U/L (ref 0–44)
AST: 20 U/L (ref 15–41)
Albumin: 4.5 g/dL (ref 3.5–5.0)
Alkaline Phosphatase: 95 U/L (ref 38–126)
Anion gap: 12 (ref 5–15)
BUN: 10 mg/dL (ref 6–20)
CO2: 23 mmol/L (ref 22–32)
Calcium: 9.4 mg/dL (ref 8.9–10.3)
Chloride: 101 mmol/L (ref 98–111)
Creatinine: 0.83 mg/dL (ref 0.44–1.00)
GFR, Est AFR Am: >60 mL/min (ref >60)
GFR, Estimated: >60 mL/min (ref >60)
Glucose, Bld: 88 mg/dL (ref 70–99)
Potassium: 3.9 mmol/L (ref 3.5–5.1)
Sodium: 136 mmol/L (ref 135–145)
Total Bilirubin: 0.4 mg/dL (ref 0.3–1.2)
Total Protein: 7.6 g/dL (ref 6.5–8.1)

## 2019-03-04 LAB — PHOSPHORUS: Phosphorus: 3.6 mg/dL (ref 2.5–4.6)

## 2019-03-04 LAB — CBC WITH DIFFERENTIAL (CANCER CENTER ONLY)
Abs Immature Granulocytes: 0.01 10*3/uL (ref 0.00–0.07)
Basophils Absolute: 0 10*3/uL (ref 0.0–0.1)
Basophils Relative: 2 %
Eosinophils Absolute: 0.1 10*3/uL (ref 0.0–0.5)
Eosinophils Relative: 4 %
HCT: 40.8 % (ref 36.0–46.0)
Hemoglobin: 13.6 g/dL (ref 12.0–15.0)
Immature Granulocytes: 0 %
Lymphocytes Relative: 40 %
Lymphs Abs: 1.1 10*3/uL (ref 0.7–4.0)
MCH: 29.7 pg (ref 26.0–34.0)
MCHC: 33.3 g/dL (ref 30.0–36.0)
MCV: 89.1 fL (ref 80.0–100.0)
Monocytes Absolute: 0.2 10*3/uL (ref 0.1–1.0)
Monocytes Relative: 8 %
Neutro Abs: 1.2 10*3/uL — ABNORMAL LOW (ref 1.7–7.7)
Neutrophils Relative %: 46 %
Platelet Count: 252 10*3/uL (ref 150–400)
RBC: 4.58 MIL/uL (ref 3.87–5.11)
RDW: 13.1 % (ref 11.5–15.5)
WBC Count: 2.7 10*3/uL — ABNORMAL LOW (ref 4.0–10.5)
nRBC: 0 % (ref 0.0–0.2)

## 2019-03-04 LAB — URIC ACID: Uric Acid, Serum: 4.3 mg/dL (ref 2.5–7.1)

## 2019-03-04 LAB — LIPASE, BLOOD: Lipase: 53 U/L — ABNORMAL HIGH (ref 11–51)

## 2019-03-04 LAB — GAMMA GT: GGT: 17 U/L (ref 7–50)

## 2019-03-04 LAB — BILIRUBIN, DIRECT: Bilirubin, Direct: 0.1 mg/dL (ref 0.0–0.2)

## 2019-03-04 LAB — AMYLASE: Amylase: 84 U/L (ref 28–100)

## 2019-03-04 LAB — MAGNESIUM: Magnesium: 2 mg/dL (ref 1.7–2.4)

## 2019-03-04 LAB — LACTATE DEHYDROGENASE: LDH: 112 U/L (ref 98–192)

## 2019-03-04 NOTE — Telephone Encounter (Signed)
I talk with patient regarding schedule  

## 2019-03-04 NOTE — Progress Notes (Addendum)
Spoke with patient via telephone for pre op interview. NPO after MN. Patient to take Effexor AM of surgery with a sip of water.patient will need UPT, CBC and BMP pre op DOS. RCC guidelines discussed with patient. Patient verbalized understanding of RCC guidelines and discharge time by 0900 the next morning. Arrival time 0930.

## 2019-03-04 NOTE — Progress Notes (Addendum)
[name]/TRIO033 C2D1 Visit  VS: Vital signs were obtained after patient sat for 5 minutes. Please see Dr. Lindi Adie encounter from this morning for documentation of the VS.  Con Meds/AEs: Patient reports that she is "doing well." She reports no side effects.  Patient has developed decreased WBC(grade 2) and decreased ANC (grade 2). Per the protocol, we do not have to hold the ribociclib or reduce the dosage.    Patient had stated that she planned to switch from fexofenadine to xyzal for her allergic rhinitis. However, the patient stated that the xyzal made her "sleepy." So instead of taking it daily, she takes it PRN at [facility] and continues to take the fexofenadine daily.   Labs: Patient confirmed she had been fasting for 8 hours prior to her labs being drawn.   Study Drug: This RN reviewed patient's medication diary. Patient has not missed any doses. Pharmacist Raul Del recorded 58 remaining pills in patient's bottle #[id number]. Patient had 100% compliance. Patient was assigned bottle # [id number] for Cycle 2. This was dispensed by pharmacist, Raul Del. Both the pharmacist and this RN double checked bottle for appropriate kit #, name, expiration date and number of pills. Bottle [id number] was dispensed to the patient along with her Cycle 2 medication diary. The patient is scheduled for surgery on 03/06/19. Per Dr. Geralyn Flash instruction, patient will start ribociclib on 03/13/19 instead of today. Patient understands this and confirms that she will start taking the ribociclib on 03/13/19.   AEs: Event Grade Onset Date End Date Attribution to Goserelin  Attribution to Anastrozole  Attribution to Ribociclib Status  Allergic Rhinitis 2 2005  No No No Ongoing; baseline  Headache 1 2000  No No No Ongoing; basline  Hot Flashes 2 12/2017  Yes Yes No Ongoing; baseline  Decreased White Blood Cell Count 1 02/18/19 03/04/19 No No Yes  Increased to grade 2  Decreased White Blood Cell Count 2 03/04/19  No No Yes  New  Decreased Absolute Neutrophil Count  2 03/04/19  No No Yes New   Hypertension 2 03/04/19  No No No New  Increased Lipase 1 03/04/19  No No Yes New   Plan: Patient will resume taking ribociclib on 03/13/19 provided she has no abnormal labs today. Patient is scheduled to come back on 08/24 for labs and on 08/26 for her Cycle 2 Day 15 visit. Thanked patient for her time and participation.  Johney Maine RN, BSN Clinical Research  03/04/19 11:00 AM

## 2019-03-06 ENCOUNTER — Encounter (HOSPITAL_BASED_OUTPATIENT_CLINIC_OR_DEPARTMENT_OTHER)
Admission: RE | Disposition: A | Discharge status: Home / Self Care | Payer: Self-pay | Source: Home / Self Care | Attending: Obstetrics

## 2019-03-06 ENCOUNTER — Ambulatory Visit (HOSPITAL_BASED_OUTPATIENT_CLINIC_OR_DEPARTMENT_OTHER): Payer: Commercial Managed Care - PPO | Admitting: Anesthesiology

## 2019-03-06 ENCOUNTER — Ambulatory Visit (HOSPITAL_BASED_OUTPATIENT_CLINIC_OR_DEPARTMENT_OTHER)
Admission: RE | Admit: 2019-03-06 | Discharge: 2019-03-06 | Disposition: A | Discharge status: Home / Self Care | Payer: Commercial Managed Care - PPO | Attending: Obstetrics | Admitting: Obstetrics

## 2019-03-06 ENCOUNTER — Encounter (HOSPITAL_BASED_OUTPATIENT_CLINIC_OR_DEPARTMENT_OTHER): Payer: Self-pay | Admitting: Anesthesiology

## 2019-03-06 DIAGNOSIS — F329 Major depressive disorder, single episode, unspecified: Secondary | ICD-10-CM | POA: Diagnosis not present

## 2019-03-06 DIAGNOSIS — C50412 Malignant neoplasm of upper-outer quadrant of left female breast: Secondary | ICD-10-CM

## 2019-03-06 DIAGNOSIS — Z170 Estrogen receptor positive status [ER+]: Secondary | ICD-10-CM

## 2019-03-06 DIAGNOSIS — Z803 Family history of malignant neoplasm of breast: Secondary | ICD-10-CM | POA: Diagnosis not present

## 2019-03-06 DIAGNOSIS — G43909 Migraine, unspecified, not intractable, without status migrainosus: Secondary | ICD-10-CM | POA: Diagnosis not present

## 2019-03-06 DIAGNOSIS — F419 Anxiety disorder, unspecified: Secondary | ICD-10-CM | POA: Insufficient documentation

## 2019-03-06 DIAGNOSIS — Z9221 Personal history of antineoplastic chemotherapy: Secondary | ICD-10-CM | POA: Diagnosis not present

## 2019-03-06 DIAGNOSIS — Z888 Allergy status to other drugs, medicaments and biological substances status: Secondary | ICD-10-CM | POA: Insufficient documentation

## 2019-03-06 DIAGNOSIS — Z853 Personal history of malignant neoplasm of breast: Secondary | ICD-10-CM | POA: Insufficient documentation

## 2019-03-06 DIAGNOSIS — Z4002 Encounter for prophylactic removal of ovary: Secondary | ICD-10-CM | POA: Diagnosis not present

## 2019-03-06 DIAGNOSIS — N83299 Other ovarian cyst, unspecified side: Secondary | ICD-10-CM | POA: Diagnosis not present

## 2019-03-06 DIAGNOSIS — Z8041 Family history of malignant neoplasm of ovary: Secondary | ICD-10-CM | POA: Diagnosis not present

## 2019-03-06 DIAGNOSIS — Z923 Personal history of irradiation: Secondary | ICD-10-CM | POA: Insufficient documentation

## 2019-03-06 DIAGNOSIS — Z79811 Long term (current) use of aromatase inhibitors: Secondary | ICD-10-CM | POA: Diagnosis not present

## 2019-03-06 DIAGNOSIS — Z882 Allergy status to sulfonamides status: Secondary | ICD-10-CM | POA: Diagnosis not present

## 2019-03-06 DIAGNOSIS — Z79899 Other long term (current) drug therapy: Secondary | ICD-10-CM | POA: Insufficient documentation

## 2019-03-06 DIAGNOSIS — N83209 Unspecified ovarian cyst, unspecified side: Secondary | ICD-10-CM | POA: Diagnosis not present

## 2019-03-06 DIAGNOSIS — Z9013 Acquired absence of bilateral breasts and nipples: Secondary | ICD-10-CM | POA: Insufficient documentation

## 2019-03-06 HISTORY — PX: LAPAROSCOPIC SALPINGO OOPHERECTOMY: SHX5927

## 2019-03-06 HISTORY — DX: Anxiety disorder, unspecified: F41.9

## 2019-03-06 HISTORY — DX: Depression, unspecified: F32.A

## 2019-03-06 LAB — ABO/RH: ABO/RH(D): A POS

## 2019-03-06 LAB — BASIC METABOLIC PANEL
Anion gap: 9 (ref 5–15)
BUN: 13 mg/dL (ref 6–20)
CO2: 24 mmol/L (ref 22–32)
Calcium: 9.3 mg/dL (ref 8.9–10.3)
Chloride: 107 mmol/L (ref 98–111)
Creatinine, Ser: 0.74 mg/dL (ref 0.44–1.00)
GFR calc Af Amer: >60 mL/min (ref >60)
GFR calc non Af Amer: >60 mL/min (ref >60)
Glucose, Bld: 90 mg/dL (ref 70–99)
Potassium: 3.9 mmol/L (ref 3.5–5.1)
Sodium: 140 mmol/L (ref 135–145)

## 2019-03-06 LAB — POCT PREGNANCY, URINE: Preg Test, Ur: NEGATIVE

## 2019-03-06 LAB — TYPE AND SCREEN
ABO/RH(D): A POS
Antibody Screen: NEGATIVE

## 2019-03-06 LAB — CBC
HCT: 44.4 % (ref 36.0–46.0)
Hemoglobin: 14.2 g/dL (ref 12.0–15.0)
MCH: 29.5 pg (ref 26.0–34.0)
MCHC: 32 g/dL (ref 30.0–36.0)
MCV: 92.1 fL (ref 80.0–100.0)
Platelets: 290 10*3/uL (ref 150–400)
RBC: 4.82 MIL/uL (ref 3.87–5.11)
RDW: 13.4 % (ref 11.5–15.5)
WBC: 2.6 10*3/uL — ABNORMAL LOW (ref 4.0–10.5)
nRBC: 0 % (ref 0.0–0.2)

## 2019-03-06 SURGERY — SALPINGO-OOPHORECTOMY, LAPAROSCOPIC
Anesthesia: General | Laterality: Bilateral

## 2019-03-06 MED ORDER — OXYCODONE HCL 5 MG/5ML PO SOLN
5.0000 mg | Freq: Once | ORAL | Status: DC | PRN
  Filled 2019-03-06: qty 5

## 2019-03-06 MED ORDER — KETOROLAC TROMETHAMINE 30 MG/ML IJ SOLN
30.0000 mg | Freq: Once | INTRAMUSCULAR | Status: DC | PRN
  Filled 2019-03-06: qty 1

## 2019-03-06 MED ORDER — FENTANYL CITRATE (PF) 100 MCG/2ML IJ SOLN
INTRAMUSCULAR | Status: DC | PRN
  Administered 2019-03-06 (×2): 50 ug via INTRAVENOUS
  Administered 2019-03-06: 100 ug via INTRAVENOUS

## 2019-03-06 MED ORDER — SUGAMMADEX SODIUM 200 MG/2ML IV SOLN
INTRAVENOUS | Status: DC | PRN
  Administered 2019-03-06: 200 mg via INTRAVENOUS

## 2019-03-06 MED ORDER — FENTANYL CITRATE (PF) 100 MCG/2ML IJ SOLN
25.0000 ug | INTRAMUSCULAR | Status: DC | PRN
  Administered 2019-03-06 (×2): 25 ug via INTRAVENOUS
  Filled 2019-03-06: qty 1

## 2019-03-06 MED ORDER — PROPOFOL 10 MG/ML IV BOLUS
INTRAVENOUS | Status: DC | PRN
  Administered 2019-03-06 (×2): 100 mg via INTRAVENOUS

## 2019-03-06 MED ORDER — EPHEDRINE SULFATE 50 MG/ML IJ SOLN
INTRAMUSCULAR | Status: DC | PRN
  Administered 2019-03-06: 10 mg via INTRAVENOUS

## 2019-03-06 MED ORDER — FENTANYL CITRATE (PF) 100 MCG/2ML IJ SOLN
INTRAMUSCULAR | Status: AC
  Filled 2019-03-06: qty 2

## 2019-03-06 MED ORDER — OXYCODONE HCL 5 MG PO TABS: 5.0000 mg | ORAL_TABLET | Freq: Four times a day (QID) | ORAL | 0 refills | Status: DC | PRN

## 2019-03-06 MED ORDER — IBUPROFEN 600 MG PO TABS: 600.0000 mg | ORAL_TABLET | Freq: Four times a day (QID) | ORAL | 0 refills | Status: DC | PRN

## 2019-03-06 MED ORDER — SCOPOLAMINE 1 MG/3DAYS TD PT72
MEDICATED_PATCH | TRANSDERMAL | Status: DC | PRN
  Administered 2019-03-06: 1 via TRANSDERMAL

## 2019-03-06 MED ORDER — ACETAMINOPHEN 500 MG PO TABS
1000.0000 mg | ORAL_TABLET | ORAL | Status: AC
  Administered 2019-03-06: 1000 mg via ORAL
  Filled 2019-03-06: qty 2

## 2019-03-06 MED ORDER — MEPERIDINE HCL 25 MG/ML IJ SOLN
6.2500 mg | INTRAMUSCULAR | Status: DC | PRN
  Filled 2019-03-06: qty 1

## 2019-03-06 MED ORDER — KETOROLAC TROMETHAMINE 30 MG/ML IJ SOLN
INTRAMUSCULAR | Status: AC
  Filled 2019-03-06: qty 1

## 2019-03-06 MED ORDER — KETOROLAC TROMETHAMINE 30 MG/ML IJ SOLN
INTRAMUSCULAR | Status: DC | PRN
  Administered 2019-03-06: 30 mg via INTRAVENOUS

## 2019-03-06 MED ORDER — MIDAZOLAM HCL 2 MG/2ML IJ SOLN
INTRAMUSCULAR | Status: AC
  Filled 2019-03-06: qty 2

## 2019-03-06 MED ORDER — GABAPENTIN 300 MG PO CAPS
ORAL_CAPSULE | ORAL | Status: AC
  Filled 2019-03-06: qty 1

## 2019-03-06 MED ORDER — OXYCODONE HCL 5 MG PO TABS
5.0000 mg | ORAL_TABLET | Freq: Once | ORAL | Status: DC | PRN
  Filled 2019-03-06: qty 1

## 2019-03-06 MED ORDER — ONDANSETRON HCL 4 MG/2ML IJ SOLN
INTRAMUSCULAR | Status: AC
  Filled 2019-03-06: qty 2

## 2019-03-06 MED ORDER — LIDOCAINE HCL (CARDIAC) PF 100 MG/5ML IV SOSY
PREFILLED_SYRINGE | INTRAVENOUS | Status: DC | PRN
  Administered 2019-03-06 (×2): 50 mg via INTRAVENOUS

## 2019-03-06 MED ORDER — LACTATED RINGERS IV SOLN
INTRAVENOUS | Status: DC
  Filled 2019-03-06: qty 1000

## 2019-03-06 MED ORDER — GABAPENTIN 300 MG PO CAPS
300.0000 mg | ORAL_CAPSULE | ORAL | Status: AC
  Administered 2019-03-06: 300 mg via ORAL
  Filled 2019-03-06: qty 1

## 2019-03-06 MED ORDER — DEXAMETHASONE SODIUM PHOSPHATE 10 MG/ML IJ SOLN
8.0000 mg | Freq: Once | INTRAMUSCULAR | Status: DC | PRN
  Filled 2019-03-06: qty 0.8

## 2019-03-06 MED ORDER — ACETAMINOPHEN 160 MG/5ML PO SOLN
325.0000 mg | ORAL | Status: DC | PRN
  Filled 2019-03-06: qty 20.3

## 2019-03-06 MED ORDER — MIDAZOLAM HCL 5 MG/5ML IJ SOLN
INTRAMUSCULAR | Status: DC | PRN
  Administered 2019-03-06: 2 mg via INTRAVENOUS

## 2019-03-06 MED ORDER — DEXAMETHASONE SODIUM PHOSPHATE 10 MG/ML IJ SOLN
INTRAMUSCULAR | Status: AC
  Filled 2019-03-06: qty 1

## 2019-03-06 MED ORDER — LIDOCAINE 2% (20 MG/ML) 5 ML SYRINGE
INTRAMUSCULAR | Status: AC
  Filled 2019-03-06: qty 5

## 2019-03-06 MED ORDER — DEXAMETHASONE SODIUM PHOSPHATE 4 MG/ML IJ SOLN
INTRAMUSCULAR | Status: DC | PRN
  Administered 2019-03-06: 10 mg via INTRAVENOUS

## 2019-03-06 MED ORDER — ONDANSETRON HCL 4 MG/2ML IJ SOLN
4.0000 mg | Freq: Once | INTRAMUSCULAR | Status: DC | PRN
  Filled 2019-03-06: qty 2

## 2019-03-06 MED ORDER — ACETAMINOPHEN 500 MG PO TABS
ORAL_TABLET | ORAL | Status: AC
  Filled 2019-03-06: qty 2

## 2019-03-06 MED ORDER — LACTATED RINGERS IV SOLN
INTRAVENOUS | Status: DC
  Administered 2019-03-06 (×2): via INTRAVENOUS
  Filled 2019-03-06: qty 1000

## 2019-03-06 MED ORDER — SODIUM CHLORIDE 0.9 % IR SOLN
Status: DC | PRN
  Administered 2019-03-06: 3000 mL

## 2019-03-06 MED ORDER — PROPOFOL 10 MG/ML IV BOLUS
INTRAVENOUS | Status: AC
  Filled 2019-03-06: qty 40

## 2019-03-06 MED ORDER — ACETAMINOPHEN 325 MG PO TABS
325.0000 mg | ORAL_TABLET | ORAL | Status: DC | PRN
  Filled 2019-03-06: qty 2

## 2019-03-06 MED ORDER — ROCURONIUM BROMIDE 100 MG/10ML IV SOLN
INTRAVENOUS | Status: DC | PRN
  Administered 2019-03-06: 10 mg via INTRAVENOUS
  Administered 2019-03-06: 50 mg via INTRAVENOUS

## 2019-03-06 SURGICAL SUPPLY — 31 items
COVER WAND RF STERILE (DRAPES) ×2 IMPLANT
DERMABOND ADVANCED (GAUZE/BANDAGES/DRESSINGS)
DERMABOND ADVANCED .7 DNX12 (GAUZE/BANDAGES/DRESSINGS) IMPLANT
DRSG COVADERM PLUS 2X2 (GAUZE/BANDAGES/DRESSINGS) ×2 IMPLANT
DRSG OPSITE POSTOP 3X4 (GAUZE/BANDAGES/DRESSINGS) ×2 IMPLANT
GAUZE 4X4 16PLY RFD (DISPOSABLE) ×2 IMPLANT
GLOVE BIOGEL PI IND STRL 6.5 (GLOVE) ×2 IMPLANT
GLOVE BIOGEL PI IND STRL 7.0 (GLOVE) ×4 IMPLANT
GLOVE BIOGEL PI INDICATOR 6.5 (GLOVE) ×2
GLOVE BIOGEL PI INDICATOR 7.0 (GLOVE) ×4
GLOVE ECLIPSE 6.0 STRL STRAW (GLOVE) ×2 IMPLANT
GOWN STRL REUS W/TWL LRG LVL3 (GOWN DISPOSABLE) ×8 IMPLANT
LIGASURE VESSEL 5MM BLUNT TIP (ELECTROSURGICAL) IMPLANT
NEEDLE INSUFFLATION 120MM (ENDOMECHANICALS) ×2 IMPLANT
NS IRRIG 1000ML POUR BTL (IV SOLUTION) ×2 IMPLANT
PACK LAPAROSCOPY BASIN (CUSTOM PROCEDURE TRAY) ×2 IMPLANT
PACK TRENDGUARD 450 HYBRID PRO (MISCELLANEOUS) IMPLANT
POUCH SPECIMEN RETRIEVAL 10MM (ENDOMECHANICALS) ×2 IMPLANT
PROTECTOR NERVE ULNAR (MISCELLANEOUS) ×6 IMPLANT
SET IRRIG TUBING LAPAROSCOPIC (IRRIGATION / IRRIGATOR) ×2 IMPLANT
SLEEVE XCEL OPT CAN 5 100 (ENDOMECHANICALS) ×2 IMPLANT
STRIP CLOSURE SKIN 1/4X4 (GAUZE/BANDAGES/DRESSINGS) ×2 IMPLANT
SUT MNCRL AB 3-0 PS2 27 (SUTURE) ×2 IMPLANT
SUT VICRYL 0 UR6 27IN ABS (SUTURE) ×2 IMPLANT
TOWEL OR 17X26 10 PK STRL BLUE (TOWEL DISPOSABLE) ×2 IMPLANT
TRAY FOLEY W/BAG SLVR 14FR (SET/KITS/TRAYS/PACK) ×2 IMPLANT
TRENDGUARD 450 HYBRID PRO PACK (MISCELLANEOUS)
TROCAR XCEL NON-BLD 11X100MML (ENDOMECHANICALS) ×2 IMPLANT
TROCAR XCEL NON-BLD 5MMX100MML (ENDOMECHANICALS) ×2 IMPLANT
TUBING EVAC SMOKE HEATED PNEUM (TUBING) ×2 IMPLANT
WARMER LAPAROSCOPE (MISCELLANEOUS) ×2 IMPLANT

## 2019-03-06 NOTE — Discharge Instructions (Signed)
You may wash incision with soap and water.  No swimming for two weeks.   Keep incision dry.  No lifting over 10 lbs for 2 weeks.   Please take motrin every 6 hours and tylenol every 6 hours.  You may take oxycodone every 4 to 6 hours as needed for severe pain.  Do not drive until you are not taking narcotic pain medication AND you can comfortably slam on the brakes.  You may experience constipation from oxycodone, so add a stool softener such as miralax if needed  DISCHARGE INSTRUCTIONS: Laparoscopy  The following instructions have been prepared to help you care for yourself upon your return home today.  Wound care:  Do not get the incision wet for the first 24 hours. The incision should be kept clean and dry.  The Band-Aids or dressings may be removed the day after surgery.  Should the incision become sore, red, and swollen after the first week, check with your doctor.  Personal hygiene:  Shower the day after your procedure.  Activity and limitations:  Do NOT drive or operate any equipment today.  Do NOT lift anything more than 15 pounds for 2-3 weeks after surgery.  Do NOT rest in bed all day.  Walking is encouraged. Walk each day, starting slowly with 5-minute walks 3 or 4 times a day. Slowly increase the length of your walks.  Walk up and down stairs slowly.  Do NOT do strenuous activities, such as golfing, playing tennis, bowling, running, biking, weight lifting, gardening, mowing, or vacuuming for 2-4 weeks. Ask your doctor when it is okay to start.  Diet: Eat a light meal as desired this evening. You may resume your usual diet tomorrow.  Return to work: This is dependent on the type of work you do. For the most part you can return to a desk job within a week of surgery. If you are more active at work, please discuss this with your doctor.  What to expect after your surgery: You may have a slight burning sensation when you urinate on the first day. You may have a very  small amount of blood in the urine. Expect to have a small amount of vaginal discharge/light bleeding for 1-2 weeks. It is not unusual to have abdominal soreness and bruising for up to 2 weeks. You may be tired and need more rest for about 1 week. You may experience shoulder pain for 24-72 hours. Lying flat in bed may relieve it.  Call your doctor for any of the following:  Develop a fever of 100.4 or greater  Inability to urinate 6 hours after discharge from hospital  Severe pain not relieved by pain medications  Persistent of heavy bleeding at incision site  Redness or swelling around incision site after a week  Increasing nausea or vomiting  Patient Signature________________________________________ Nurse Signature_________________________________________    Post Anesthesia Home Care Instructions  Activity: Get plenty of rest for the remainder of the day. A responsible adult should stay with you for 24 hours following the procedure.  For the next 24 hours, DO NOT: -Drive a car -Paediatric nurse -Drink alcoholic beverages -Take any medication unless instructed by your physician -Make any legal decisions or sign important papers.  Meals: Start with liquid foods such as gelatin or soup. Progress to regular foods as tolerated. Avoid greasy, spicy, heavy foods. If nausea and/or vomiting occur, drink only clear liquids until the nausea and/or vomiting subsides. Call your physician if vomiting continues.  Special Instructions/Symptoms: Your  throat may feel dry or sore from the anesthesia or the breathing tube placed in your throat during surgery. If this causes discomfort, gargle with warm salt water. The discomfort should disappear within 24 hours.  If you had a scopolamine patch placed behind your ear for the management of post- operative nausea and/or vomiting:  1. The medication in the patch is effective for 72 hours, after which it should be removed.  Wrap patch in a tissue  and discard in the trash. Wash hands thoroughly with soap and water. 2. You may remove the patch earlier than 72 hours if you experience unpleasant side effects which may include dry mouth, dizziness or visual disturbances. 3. Avoid touching the patch. Wash your hands with soap and water after contact with the patch.

## 2019-03-06 NOTE — Anesthesia Postprocedure Evaluation (Signed)
Anesthesia Post Note  Patient: Kristi [name]  Procedure(s) Performed: LAPAROSCOPIC SALPINGO OOPHORECTOMY (Bilateral )     Patient location during evaluation: PACU Anesthesia Type: General Level of consciousness: sedated Pain management: pain level controlled Vital Signs Assessment: post-procedure vital signs reviewed and stable Respiratory status: spontaneous breathing Cardiovascular status: stable Postop Assessment: no apparent nausea or vomiting Anesthetic complications: no    Last Vitals:  Vitals:   03/06/19 1424 03/06/19 1430  BP: 127/85 125/71  Pulse: 98 100  Resp: 15 13  Temp: (!) 36.2 C   SpO2: 100% 100%    Last Pain:  Vitals:   03/06/19 1424  TempSrc:   PainSc: 0-No pain   Pain Goal: Patients Stated Pain Goal: 5 (03/06/19 0901)                 Huston Foley

## 2019-03-06 NOTE — H&P (Signed)
43 y.o. G0 presents for BSO.  She has a history of ER+/PR+ Stage IIB breast cancer, s/p double mastectomies in 06/2018, chemotherapy and radiation, completed in 10/2018. Now on zoladex and anastrazole. Negative BRCA 1/2, but given long term plan for medical ovarian suppression and family history of ovarian cancer, patient desires prophylactic BSO. She has no plans for future pregnancy / fertility.   Currently on anastrazole /zoladex x 7 years while on anastrazole. Started anastrazole on march 15, zoladex at diagnosis. Last period 2009. She is also currently enrolled in [place] clinical trial: Phase 3 clinical trial in patients or estrogen receptor positive randomized between standard antiestrogen therapy versus antiestrogen therapy with ribociclib 400 mg for 36 months.    Past Medical History:  Diagnosis Date  . Allergy   . Anxiety   . Breast cancer, left ([place]) 12/2017  . Depression   . Endometriosis   . Family history of breast cancer   . Family history of ovarian cancer   . Family history of pancreatic cancer   . Family history of prostate cancer   . Gastritis determined by endoscopy    Inactive chronic gastritis per endoscopy  . History of chemotherapy    finished chemo 06/18/2018  . Hot flashes 01/16/2018   per patient since she started zoladex injections  . Migraines   . PONV (postoperative nausea and vomiting)   . Wears contact lenses     Past Surgical History:  Procedure Laterality Date  . AXILLARY LYMPH NODE DISSECTION Left 07/24/2018  . AXILLARY LYMPH NODE DISSECTION Left 07/24/2018   Procedure: LEFT AXILLARY LYMPH NODE DISSECTION;  Surgeon: Rolm Bookbinder, MD;  Location: [facility];  Service: General;  Laterality: Left;  GENERAL WITH PEC BLOCK  . BREAST RECONSTRUCTION WITH PLACEMENT OF TISSUE EXPANDER AND ALLODERM Bilateral 07/02/2018   Procedure: BILATERAL BREAST RECONSTRUCTION WITH PLACEMENT OF TISSUE EXPANDER AND ACELLULAR DERMIS;  Surgeon: Irene Limbo, MD;  Location:  [facility];  Service: Plastics;  Laterality: Bilateral;  . COLONOSCOPY    . MASTECTOMY WITH RADIOACTIVE SEED GUIDED EXCISION AND AXILLARY SENTINEL LYMPH NODE BIOPSY Bilateral 07/02/2018   Procedure: BILATERAL NIPPLE SPARING MASTECTOMIES WITH LEFT AXILLARY SENTINEL LYMPH NODE BIOPSY AND LEFT SEED GUIDED NODE EXCISION;  Surgeon: Rolm Bookbinder, MD;  Location: [facility];  Service: General;  Laterality: Bilateral;  . NASAL POLYP EXCISION    . OVARIAN CYST REMOVAL    . PORTA CATH REMOVAL Right 07/02/2018   Procedure: PORTA CATH REMOVAL;  Surgeon: Rolm Bookbinder, MD;  Location: [facility];  Service: General;  Laterality: Right;  . PORTACATH PLACEMENT Right 01/15/2018   Procedure: INSERTION PORT-A-CATH WITH [place];  Surgeon: Rolm Bookbinder, MD;  Location: [facility];  Service: General;  Laterality: Right;  . WISDOM TOOTH EXTRACTION      OB History  No obstetric history on file.    Social History   Socioeconomic History  . Marital status: Single    Spouse name: Not on file  . Number of children: Not on file  . Years of education: Not on file  . Highest education level: Not on file  Occupational History  . Not on file  Social Needs  . Financial resource strain: Not on file  . Food insecurity    Worry: Not on file    Inability: Not on file  . Transportation needs    Medical: No    Non-medical: No  Tobacco Use  . Smoking status: Never Smoker  . Smokeless  tobacco: Never Used  Substance and Sexual Activity  . Alcohol use: Yes    Comment: rare  . Drug use: Never  . Sexual activity: Not on file  Relationships  . Intimate partner violence    Fear of current or ex partner: No    Emotionally abused: No    Physically abused: No    Forced sexual activity: No  Other Topics Concern  . Not on file  Social History Narrative  . Not on file   Sulfa antibiotics, Chlorhexidine gluconate, and Other    Vitals:   03/06/19 0901   BP: 132/87  Pulse: 90  Resp: 16  Temp: 98.2 F (36.8 C)  SpO2: 100%     General:  NAD  UPT neg in holding  A/P   43 y.o. presents for bilateral salpingo-oophorectomy in the setting of ER+ breast cancer.  We have discussed in detail risk, benefits, alternatives to procedures.  We discussed risks to surgery, including infection, bleeding, damage to surrounding structures (including but not limited to bowel, bladder, ureters, nerves, vessels), the potential for conversion to laparotomy, the need for additional procedures, dvt/pe. We discussed the potential benefit of ovarian conservation, including cardiac and bone health. Although she is brca1/2 negative, this does not account for all possible etiologies of ovarian cancer. She strongly desires a surgical option for management as opposed to medical menopause with continued zoladex. In addition, she does have a family history of ovarian cancer. We discussed also impact of bso on bone health, vaginal health, surgical menopause. She has been "medically" menopausal for the last year and managing symptoms well. We also discussed the potential impact of her history of endometriosis on adhesions and increased complexity of surgery and risk of complications, such as conversion to laparotomy. All questions answered, she wishes to proceed.  Consent signed.  UPT negative in holding.   [place]

## 2019-03-06 NOTE — Anesthesia Procedure Notes (Signed)
Procedure Name: Intubation Date/Time: 03/06/2019 1:04 PM Performed by: Justice Rocher, CRNA Pre-anesthesia Checklist: Patient identified, Emergency Drugs available, Suction available and Patient being monitored Patient Re-evaluated:Patient Re-evaluated prior to induction Oxygen Delivery Method: Circle system utilized Preoxygenation: Pre-oxygenation with 100% oxygen Induction Type: IV induction Ventilation: Mask ventilation without difficulty Laryngoscope Size: Mac and 3 Grade View: Grade II Tube type: Oral Tube size: 7.5 mm Number of attempts: 1 Airway Equipment and Method: Stylet and Oral airway Placement Confirmation: ETT inserted through vocal cords under direct vision,  positive ETCO2 and breath sounds checked- equal and bilateral Secured at: 22 cm Tube secured with: Tape Dental Injury: Teeth and Oropharynx as per pre-operative assessment

## 2019-03-06 NOTE — Transfer of Care (Signed)
Immediate Anesthesia Transfer of Care Note  Patient: Kristi [name]  Procedure(s) Performed: Procedure(s) (LRB): LAPAROSCOPIC SALPINGO OOPHORECTOMY (Bilateral)  Patient Location: PACU  Anesthesia Type: General  Level of Consciousness: awake, sedated, patient cooperative and responds to stimulation  Airway & Oxygen Therapy: Patient Spontanous Breathing and Patient connected to Oasis 02 and soft FM  Post-op Assessment: Report given to PACU RN, Post -op Vital signs reviewed and stable and Patient moving all extremities  Post vital signs: Reviewed and stable  Complications: No apparent anesthesia complications

## 2019-03-06 NOTE — Anesthesia Preprocedure Evaluation (Addendum)
Anesthesia Evaluation  Patient identified by MRN, date of birth, ID band Patient awake    Reviewed: Allergy & Precautions, NPO status , Patient's Chart, lab work & pertinent test results  History of Anesthesia Complications (+) PONV and history of anesthetic complications  Airway Mallampati: I  TM Distance: >3 FB Neck ROM: Full    Dental no notable dental hx. (+) Dental Advisory Given, Teeth Intact   Pulmonary neg pulmonary ROS,    breath sounds clear to auscultation       Cardiovascular negative cardio ROS Normal cardiovascular exam Rhythm:Regular Rate:Normal     Neuro/Psych  Headaches, PSYCHIATRIC DISORDERS Anxiety Depression    GI/Hepatic negative GI ROS, Neg liver ROS,   Endo/Other  negative endocrine ROS  Renal/GU negative Renal ROS     Musculoskeletal   Abdominal Normal abdominal exam  (+)   Peds  Hematology negative hematology ROS (+)   Anesthesia Other Findings   Reproductive/Obstetrics negative OB ROS                             Lab Results  Component Value Date   WBC 2.6 (L) 03/06/2019   HGB 14.2 03/06/2019   HCT 44.4 03/06/2019   MCV 92.1 03/06/2019   PLT 290 03/06/2019   Lab Results  Component Value Date   CREATININE 0.74 03/06/2019   BUN 13 03/06/2019   NA 140 03/06/2019   K 3.9 03/06/2019   CL 107 03/06/2019   CO2 24 03/06/2019    Anesthesia Physical  Anesthesia Plan  ASA: II  Anesthesia Plan: General   Post-op Pain Management:    Induction: Intravenous  PONV Risk Score and Plan: 3 and Dexamethasone, Ondansetron, Treatment may vary due to age or medical condition, Midazolam and Scopolamine patch - Pre-op  Airway Management Planned: Oral ETT  Additional Equipment: None  Intra-op Plan:   Post-operative Plan: Extubation in OR  Informed Consent: I have reviewed the patients History and Physical, chart, labs and discussed the procedure including the  risks, benefits and alternatives for the proposed anesthesia with the patient or authorized representative who has indicated his/her understanding and acceptance.     Dental advisory given  Plan Discussed with: CRNA  Anesthesia Plan Comments:        Anesthesia Quick Evaluation

## 2019-03-06 NOTE — Op Note (Signed)
PRE-OPERATIVE DIAGNOSIS:  History of breast cancer  POST-OPERATIVE DIAGNOSIS:  History of breast cancer  PROCEDURE:  Procedure(s): LAPAROSCOPIC Salpingo OOPHORECTOMY (Bilateral)  SURGEON:  Surgeon(s) and Role:    * Jerelyn Charles, MD - Primary    * Bobbye Charleston, MD- Assisting  ANESTHESIA:   general  EBL:  5 mL  LOCAL MEDICATIONS USED:  MARCAINE   Amount: 5 ml  SPECIMEN:  bilateral fallopian tubes and ovaries  DISPOSITION OF SPECIMEN:  PATHOLOGY  Operative Findings: Small anteverted uterus, small ovaries and tubes bilaterally.  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  Small filmy adhesion of omentum to the anterior abdominal wall at umbilicus.   Description of the Procedure:  The patient was taken to the operating room, where general endotracheal anesthesia was obtained without difficulty. She was placed in the dorsal lithotomy position in [place] and exam under anesthesia was performed, and a small mobile uterus was appreciated. The patient was prepped and draped in the normal sterile fashion. A Foley catheter was inserted sterilely into the bladder. A bivalve speculum was placed into the vagina and acorn uterine manipulator was placed without difficulty after gentle cervical dilation with Hagar dilators. Attention was then turned to the abdomen. The scalpel was used to make a 10 mm vertical incision at the umbilicus. The Veress needle was inserted at a 45 degree angle to the pelvis and no bowel contents or blood were aspirated.  The abdomen was insufflated and the 10/11 mm trocar placed under direct visualization.  The operative scope was introduced and the above findings noted.   The LigaSure was then introduced into the abdomen. The small omental adhesion was taken down with blunt dissection.  The right ureter was identified.  The right infindibulopelvic ligament was identified well away from the level of the ureter.  The LigaSure was used to doubly clamp,  cut, and burn the IP followed by the mesosalpinx to the cornua.  The fallopian tube was then transected at the level of the cornua.  In a similar manner, the left fallopian tube and ovary were removed.  The pedicles were examined and were hemostatic. The ureter was examined and excellent peristalsis was noted.  The endocatch device was introduced into the abdomen and the right tube and ovary were placed into the bag.  The bag was removed from the abdomen at the umbilicus.  At this time, all trocars were removed from the abdomen.  The fascia was closed with 0-vicryl with a figure of eight stitch.  The skin incisions were closed with Monocryl in a subcuticular fashion.  All instruments were removed from the vagina.  The patient tolerated all portions of procedure well. Sponge, lap, and needle count were correct x2.

## 2019-03-07 ENCOUNTER — Encounter (HOSPITAL_BASED_OUTPATIENT_CLINIC_OR_DEPARTMENT_OTHER): Payer: Self-pay | Admitting: Obstetrics

## 2019-03-08 ENCOUNTER — Encounter: Payer: Self-pay | Admitting: Oncology

## 2019-03-08 NOTE — Progress Notes (Signed)
[name] Trial, Scanned Paper Trial Documents  I certify that all documents with the label Phillipa Morden dated 03/07/2019 scanned into the Electronic Medical Record have the same information, data, content, and structure as the original paper source documents.  A label noting the patient's identifying information was added to the scanned version in order to confirm correct patient for addition into the medical record.  I verify that this patient label in no way obscures information from the paper source document.  Remer Macho NELSON  03/08/19  2:54 PM

## 2019-03-13 ENCOUNTER — Telehealth: Payer: Self-pay

## 2019-03-13 NOTE — Telephone Encounter (Signed)
Called patient to confirm that on 01/30/19 she fasted for 8 hours prior to her labs being drawn. Pt confirmed that she fasted. Johney Maine RN, BSN Clinical Research  03/13/19 3:33 PM

## 2019-03-14 ENCOUNTER — Other Ambulatory Visit: Payer: Self-pay | Admitting: *Deleted

## 2019-03-15 ENCOUNTER — Other Ambulatory Visit: Payer: Self-pay

## 2019-03-15 ENCOUNTER — Telehealth: Payer: Self-pay | Admitting: *Deleted

## 2019-03-15 ENCOUNTER — Ambulatory Visit
Admission: RE | Admit: 2019-03-15 | Discharge: 2019-03-15 | Disposition: A | Discharge status: Home / Self Care | Payer: Commercial Managed Care - PPO | Source: Ambulatory Visit | Attending: Adult Health | Admitting: Adult Health

## 2019-03-15 ENCOUNTER — Other Ambulatory Visit: Payer: Self-pay | Admitting: *Deleted

## 2019-03-15 DIAGNOSIS — C50412 Malignant neoplasm of upper-outer quadrant of left female breast: Secondary | ICD-10-CM

## 2019-03-15 DIAGNOSIS — Z170 Estrogen receptor positive status [ER+]: Secondary | ICD-10-CM

## 2019-03-15 DIAGNOSIS — E2839 Other primary ovarian failure: Secondary | ICD-10-CM

## 2019-03-15 NOTE — Telephone Encounter (Signed)
LVM reminding patient of scheduled lab appointment for 03/18/19 and fasting 8 hours prior to labs.

## 2019-03-18 ENCOUNTER — Other Ambulatory Visit: Payer: Self-pay

## 2019-03-18 ENCOUNTER — Inpatient Hospital Stay: Payer: Commercial Managed Care - PPO

## 2019-03-18 DIAGNOSIS — Z170 Estrogen receptor positive status [ER+]: Secondary | ICD-10-CM

## 2019-03-18 DIAGNOSIS — C50412 Malignant neoplasm of upper-outer quadrant of left female breast: Secondary | ICD-10-CM

## 2019-03-18 LAB — CBC WITH DIFFERENTIAL (CANCER CENTER ONLY)
Abs Immature Granulocytes: 0 10*3/uL (ref 0.00–0.07)
Basophils Absolute: 0 10*3/uL (ref 0.0–0.1)
Basophils Relative: 1 %
Eosinophils Absolute: 0.1 10*3/uL (ref 0.0–0.5)
Eosinophils Relative: 3 %
HCT: 41.5 % (ref 36.0–46.0)
Hemoglobin: 13.9 g/dL (ref 12.0–15.0)
Immature Granulocytes: 0 %
Lymphocytes Relative: 23 %
Lymphs Abs: 0.9 10*3/uL (ref 0.7–4.0)
MCH: 29.8 pg (ref 26.0–34.0)
MCHC: 33.5 g/dL (ref 30.0–36.0)
MCV: 89.1 fL (ref 80.0–100.0)
Monocytes Absolute: 0.2 10*3/uL (ref 0.1–1.0)
Monocytes Relative: 5 %
Neutro Abs: 2.7 10*3/uL (ref 1.7–7.7)
Neutrophils Relative %: 68 %
Platelet Count: 345 10*3/uL (ref 150–400)
RBC: 4.66 MIL/uL (ref 3.87–5.11)
RDW: 13.7 % (ref 11.5–15.5)
WBC Count: 3.9 10*3/uL — ABNORMAL LOW (ref 4.0–10.5)
nRBC: 0 % (ref 0.0–0.2)

## 2019-03-18 LAB — CMP (CANCER CENTER ONLY)
ALT: 28 U/L (ref 0–44)
AST: 23 U/L (ref 15–41)
Albumin: 4.5 g/dL (ref 3.5–5.0)
Alkaline Phosphatase: 104 U/L (ref 38–126)
Anion gap: 10 (ref 5–15)
BUN: 12 mg/dL (ref 6–20)
CO2: 26 mmol/L (ref 22–32)
Calcium: 9.5 mg/dL (ref 8.9–10.3)
Chloride: 101 mmol/L (ref 98–111)
Creatinine: 0.88 mg/dL (ref 0.44–1.00)
GFR, Est AFR Am: >60 mL/min (ref >60)
GFR, Estimated: >60 mL/min (ref >60)
Glucose, Bld: 85 mg/dL (ref 70–99)
Potassium: 3.7 mmol/L (ref 3.5–5.1)
Sodium: 137 mmol/L (ref 135–145)
Total Bilirubin: 0.7 mg/dL (ref 0.3–1.2)
Total Protein: 7.8 g/dL (ref 6.5–8.1)

## 2019-03-18 LAB — PHOSPHORUS: Phosphorus: 4.1 mg/dL (ref 2.5–4.6)

## 2019-03-18 LAB — LIPASE, BLOOD: Lipase: 51 U/L (ref 11–51)

## 2019-03-18 LAB — GAMMA GT: GGT: 23 U/L (ref 7–50)

## 2019-03-18 LAB — AMYLASE: Amylase: 89 U/L (ref 28–100)

## 2019-03-18 LAB — MAGNESIUM: Magnesium: 2.2 mg/dL (ref 1.7–2.4)

## 2019-03-18 LAB — BILIRUBIN, DIRECT: Bilirubin, Direct: 0.1 mg/dL (ref 0.0–0.2)

## 2019-03-18 LAB — URIC ACID: Uric Acid, Serum: 4.7 mg/dL (ref 2.5–7.1)

## 2019-03-18 LAB — LACTATE DEHYDROGENASE: LDH: 117 U/L (ref 98–192)

## 2019-03-18 NOTE — Progress Notes (Signed)
Spoke with patient prior to her lab draw this morning. Patient confirmed she fasted for 8 hours prior. Confirmed with patient that she is coming in at 0900 on 08/26 for her Cycle 2 Day 15 [name] appt.  Johney Maine RN, BSN Clinical Research  03/18/19 11:20 AM

## 2019-03-19 ENCOUNTER — Telehealth: Payer: Self-pay

## 2019-03-19 NOTE — Telephone Encounter (Signed)
Spoke with patient to inform of BD results showing concern for osteopenia.  Per NP recommendation, take calcium and vitamin D and weight bearing exercises.  Repeat BD in 2 years.  Patient voiced understanding.

## 2019-03-19 NOTE — Telephone Encounter (Signed)
-----   Message from Gardenia Phlegm, NP sent at 03/19/2019  3:17 PM EDT ----- Please call patient.  Bone density is concerning for osteopenia.  Recommend calcium, vitamin d, weight bearing exercises.  She needs repeat in 2 years.   ----- Message ----- From: Interface, Rad Results In Sent: 03/15/2019   5:11 PM EDT To: Gardenia Phlegm, NP

## 2019-03-20 ENCOUNTER — Encounter: Payer: Self-pay | Admitting: *Deleted

## 2019-03-20 ENCOUNTER — Inpatient Hospital Stay: Payer: Commercial Managed Care - PPO

## 2019-03-20 ENCOUNTER — Other Ambulatory Visit: Payer: Self-pay

## 2019-03-20 DIAGNOSIS — Z170 Estrogen receptor positive status [ER+]: Secondary | ICD-10-CM

## 2019-03-20 DIAGNOSIS — C50412 Malignant neoplasm of upper-outer quadrant of left female breast: Secondary | ICD-10-CM

## 2019-03-20 NOTE — Research (Signed)
[name], Cycle 2 Day 15 Visit: Patient into clinic by herself this morning to complete study assessments for C2D15.  Patient reports she took her Anastrozole and Ribociclib this morning at home around 7:45 am.  She states she did not know she was supposed to take them in clinic today. Explained to patient that per study protocol we need to perform a pre-dose and post-dose ECG this visit so she will need to take her study meds in clinic after the first ECG.  Patient can return tomorrow morning at 9 am for this visit. Instructed patient to not take Ribociclib or Anastrozole until instructed in clinic tomorrow.  Instructed patient to bring her medication bottles and diary with her for tomorrow's visit. She verbalized understanding.  Concomitant Medications; Reviewed medication list with patient while she was here this morning. She reports not taking the oxycodone or ibuprofen any more. She also reports new Rx for Phenergan which she started taking on 03/11/19 for nausea and is still taking daily for ongoing nausea. Medication list updated.  Adverse Events;  Patient reports the following; Sore Throat, Grade 1- 02/27/19-ongoing; Intermittent sore throat which has not affected her swallowing or eating and she has not taken any medication for this.  Nausea, Grade 1- 03/11/19-ongoing; Nausea daily, without any vomiting, and is able to eat and drink without any changes. Patient has been taking phenergan at least once a day since 03/11/19.  Dysgeusia, Grade 1- [id number]; Metallic taste in her mouth and numbness feeling on her tongue for past week. This has not altered her eating habits.  Tachycardia, Grade 1 - 03/13/19-ongoing; Patient reports elevated HR of 115 last week 03/13/19 at GYN visit and today HR is 112 after resting more than 5 minutes. She denies any chest pains, chest tightness, palpitations or shortness of breath.  Reported patient's new AEs above to Dr. Lindi Adie.  He instructs for patient to return  tomorrow as planned for ECG and assessments prior to taking Ribociclib.  Research nurse will bring ECG to Dr. Lindi Adie for review and update on patient's condition tomorrow prior to next dose of study drug. Informed patient of plan above and she is in agreement.  Thanked patient for her time today and research nurse will meet her at 9 am tomorrow morning to complete this visit.  [name], BSN, RN Clinical Research Nurse 03/20/2019 10:44 AM

## 2019-03-21 ENCOUNTER — Inpatient Hospital Stay: Payer: Commercial Managed Care - PPO

## 2019-03-21 ENCOUNTER — Inpatient Hospital Stay: Payer: Commercial Managed Care - PPO | Admitting: *Deleted

## 2019-03-21 VITALS — BP 120/85 | HR 89 | Temp 98.2°F | Wt 140.8 lb

## 2019-03-21 DIAGNOSIS — C50412 Malignant neoplasm of upper-outer quadrant of left female breast: Secondary | ICD-10-CM

## 2019-03-21 DIAGNOSIS — Z170 Estrogen receptor positive status [ER+]: Secondary | ICD-10-CM

## 2019-03-21 NOTE — Research (Addendum)
C2D15Assessment forNATALEE [name]: Patient into clinic by herself this morning on Day 16 of Cycle 2 to complete the [name] activities for Day 15 of Cycle 2.  Labs: Fasting labs collected on 03/18/19 per protocol. Results reviewed by Dr. Lindi Adie and are within parameters per [name] protocol to continue on Ribociclib as planned.  Corrected Calcium Formula:  4 g/dL utilized for "normal albumin;" local normal range is 3.5-5 g/dL. Corrected Calcium = (0.8 x [normal albumin - patient's albumin]) + serum calcium Correct Calcium = (0.8 x[4- 4.5]) + 9.5 Corrected Calcium = 9.1 VS: Completed per protocol.  Con Meds: Reviewed with patient yesterday and she denies any changes today. Medication list is up to date.Reminded patient to notify research nurse of any new medications prior to taking them unless it is an emergency. She verbalized understanding.  AEs: Reviewed with patient. Patient reports a new mouth sore/ulcer this morning. It is not affecting her intake and she is not taking any medications for this mouth sore. No other changes since AE review yesterday.  See AE table below. Pre-dose [id number] at 9:38 am and QTcF = 431ms.Dr. Lindi Adie reviewed and states ok tocontinueRibociclib Cycle 2, D16 today on [name] as planned.   Post-dose ECG: Completed 2 hrs post dose at 11:58 am and QTcF = 451ms. Dr. Lindi Adie reviewed and states no interventions required and continue treatment as planned.  [name] Drug:Patient stated she started taking Ribociclib for this cycle on 03/13/19 as directed and has not missed any doses.  This is reflected on her medication diary.  Patient took dose of Ribociclib and Anastrozole in clinic at 10:00 am and documented in medication diary.  Patient did not bring medication bottle with her, just the pills in a baggie, therefore pill count was not done. Instructed patient to bring Ribociclib bottle with leftover pills on next [name] visit. She verbalized understanding.  Plan: Gave patient  calendar schedule for next [name] lab and visit on 9/8 and 9/9.  Wrote note to fast for 8 hrs prior to lab appointment and to bring diaries and medication bottle on 9/9.  Patient reported breast reconstruction surgery scheduled for 9/25. Informed patient Dr. Lindi Adie will provide instructions for when to hold Ribociclib prior to and after her surgery when she sees him on 9/9. Instructed patient to call if any questions or concerns prior to next visit. She verbalized understanding.  Thanked patient for her time today.   AEs: Event Grade Onset Date End Date Attribution to Goserelin  Attribution to Anastrozole  Attributionto Ribociclib Status Comments  Allergic Rhinitis 2 2005  No No No Ongoing; baseline   Headache 1 2000  No No No Ongoing; basline   Hot Flashes 2 12/2017  Yes Yes No Ongoing; baseline   Decreased White Blood Cell Count 2 03/04/19 03/18/19 No No Yes Decreased to Grade 1   Decreased Absolute Neutrophil Count  2 03/04/19 03/18/19 No No Yes Resolved    Hypertension 2 03/04/19 03/20/19 No No No Decreased to Grade 1   Increased Lipase 1 03/04/19 03/18/19 No No Yes Resolved   Sore Throat 1 02/27/19  No No No ongoing Intermittent  Nausea 1 03/11/19  No No No ongoing   Dysgeusia 1 03/13/19  No No No ongoing Metallic taste  Tachycardia 1 03/13/19  No No No ongoing   Decreased WBC 1 03/18/19  No No Yes ongoing   Hypertension 1 03/20/19  No No No ongoing   Mouth Sore 1 03/21/19  No No No ongoing  [name], BSN, RN Clinical Research Nurse 03/21/2019

## 2019-03-25 NOTE — Progress Notes (Deleted)
C2D15Assessment forNATALEE STUDY: Patient into clinic by herself this morning on Day 16 of Cycle 2 to complete the study activities for Day 15 of Cycle 2.  Labs: Fasting labs collected on 03/18/19 per protocol. Results reviewed by Dr. Lindi Adie and are within parameters per study protocol to continue on Ribociclib as planned.  Corrected Calcium Formula:  4 g/dL utilized for "normal albumin;" local normal range is 3.5-5 g/dL. Corrected Calcium = (0.8 x [normal albumin - patient's albumin]) + serum calcium Correct Calcium = (0.8 x[4- 4.5]) + 9.5 Corrected Calcium = 9.1 VS: Completed per protocol.  Con Meds: Reviewed with patient yesterday and she denies any changes today. Medication list is up to date.Reminded patient to notify research nurse of any new medications prior to taking them unless it is an emergency.She verbalized understanding.  AEs: Reviewed with patient.Patient reports a new mouth sore/ulcer this morning. It is not affecting her intake and she is not taking any medications for this mouth sore. No other changes since AE review yesterday.  See AE table below. Pre-dose [id number] at9:38am and QTcF = 439ms.Dr.Gudenareviewed and states ok tocontinueRibociclib Cycle 2, D16today on study as planned. Post-dose ECG: Completed 2 hrs post dose at 11:58 am and QTcF = 467ms. Dr. Lindi Adie reviewed and states no interventions required and continue treatment as planned.  Study Drug:Patient stated she started taking Ribociclib for this cycle on 03/13/19 as directed and has not missed any doses.  This is reflected on her medication diary.  Patient took dose of Ribociclib and Anastrozole in clinic at10:00 amand documented in medication diary. Patient did not bring medication bottle with her, just the pills in a baggie, therefore pill count was not done. Instructed patient to bring Ribociclib bottle with leftover pills on next study visit. She verbalized understanding.  Plan: Gave patient  calendar schedule for next study lab and visit on 9/8 and 9/9.  Wrote note to fast for 8 hrs prior to lab appointment and to bring diaries and medication bottle on 9/9.  Patient reported breast reconstruction surgery scheduled for 9/25. Informed patient Dr. Lindi Adie will provide instructions for when to hold Ribociclib prior to and after her surgery when she sees him on 9/9. Instructed patient to call if any questions or concerns prior to next visit. She verbalized understanding.  Thanked patient for her time today.   AEs: Event Grade Onset Date End Date Attribution to Goserelin  Attribution to Anastrozole Attributionto Ribociclib Status Comments  Allergic Rhinitis 2 2005  No No No Ongoing; baseline   Headache 1 2000  No No No Ongoing; basline   Hot Flashes 2 12/2017  Yes Yes No Ongoing; baseline   Decreased White Blood Cell Count 2 03/04/19 03/18/19 No No Yes Decreased to Grade 1   Decreased Absolute Neutrophil Count  2 03/04/19 03/18/19 No No Yes Resolved   Hypertension 2 03/04/19 03/20/19 No No No Decreased to Grade 1   Increased Lipase 1 03/04/19 03/18/19 No No Yes Resolved   Sore Throat 1 02/27/19  No No No ongoing Intermittent  Nausea 1 03/11/19  No No No ongoing   Dysgeusia 1 03/13/19  No No No ongoing Metallic taste  Tachycardia 1 03/13/19  No No No ongoing   Decreased WBC 1 03/18/19  No No Yes ongoing   Hypertension 1 03/20/19  No No No ongoing   Mouth Sore 1 03/21/19  No No No ongoing    [name], BSN, Therapist, sports Nurse 03/21/2019

## 2019-03-27 ENCOUNTER — Other Ambulatory Visit: Payer: Self-pay | Admitting: Obstetrics

## 2019-03-27 ENCOUNTER — Other Ambulatory Visit: Payer: Self-pay

## 2019-03-27 ENCOUNTER — Ambulatory Visit
Admission: RE | Admit: 2019-03-27 | Discharge: 2019-03-27 | Disposition: A | Discharge status: Home / Self Care | Payer: Commercial Managed Care - PPO | Source: Ambulatory Visit | Attending: Obstetrics | Admitting: Obstetrics

## 2019-03-27 DIAGNOSIS — R110 Nausea: Secondary | ICD-10-CM

## 2019-03-27 DIAGNOSIS — R109 Unspecified abdominal pain: Secondary | ICD-10-CM

## 2019-03-27 MED ORDER — IOPAMIDOL (ISOVUE-300) INJECTION 61%
100.0000 mL | Freq: Once | INTRAVENOUS | Status: AC | PRN
  Administered 2019-03-27: 100 mL via INTRAVENOUS

## 2019-04-02 ENCOUNTER — Encounter: Payer: Self-pay | Admitting: *Deleted

## 2019-04-02 ENCOUNTER — Telehealth: Payer: Self-pay | Admitting: *Deleted

## 2019-04-02 ENCOUNTER — Other Ambulatory Visit: Payer: Self-pay

## 2019-04-02 ENCOUNTER — Inpatient Hospital Stay: Payer: Commercial Managed Care - PPO | Attending: Hematology and Oncology

## 2019-04-02 ENCOUNTER — Ambulatory Visit: Payer: Commercial Managed Care - PPO

## 2019-04-02 DIAGNOSIS — C50412 Malignant neoplasm of upper-outer quadrant of left female breast: Secondary | ICD-10-CM

## 2019-04-02 DIAGNOSIS — Z90722 Acquired absence of ovaries, bilateral: Secondary | ICD-10-CM | POA: Insufficient documentation

## 2019-04-02 DIAGNOSIS — Z006 Encounter for examination for normal comparison and control in clinical research program: Secondary | ICD-10-CM | POA: Insufficient documentation

## 2019-04-02 DIAGNOSIS — Z9221 Personal history of antineoplastic chemotherapy: Secondary | ICD-10-CM | POA: Insufficient documentation

## 2019-04-02 DIAGNOSIS — Z923 Personal history of irradiation: Secondary | ICD-10-CM | POA: Insufficient documentation

## 2019-04-02 DIAGNOSIS — R439 Unspecified disturbances of smell and taste: Secondary | ICD-10-CM | POA: Insufficient documentation

## 2019-04-02 DIAGNOSIS — Z9013 Acquired absence of bilateral breasts and nipples: Secondary | ICD-10-CM | POA: Insufficient documentation

## 2019-04-02 DIAGNOSIS — Z780 Asymptomatic menopausal state: Secondary | ICD-10-CM | POA: Insufficient documentation

## 2019-04-02 DIAGNOSIS — Z170 Estrogen receptor positive status [ER+]: Secondary | ICD-10-CM | POA: Insufficient documentation

## 2019-04-02 DIAGNOSIS — R110 Nausea: Secondary | ICD-10-CM | POA: Insufficient documentation

## 2019-04-02 DIAGNOSIS — Z79811 Long term (current) use of aromatase inhibitors: Secondary | ICD-10-CM | POA: Insufficient documentation

## 2019-04-02 DIAGNOSIS — G629 Polyneuropathy, unspecified: Secondary | ICD-10-CM | POA: Insufficient documentation

## 2019-04-02 LAB — CMP (CANCER CENTER ONLY)
ALT: 17 U/L (ref 0–44)
AST: 21 U/L (ref 15–41)
Albumin: 4.6 g/dL (ref 3.5–5.0)
Alkaline Phosphatase: 95 U/L (ref 38–126)
Anion gap: 10 (ref 5–15)
BUN: 9 mg/dL (ref 6–20)
CO2: 24 mmol/L (ref 22–32)
Calcium: 9 mg/dL (ref 8.9–10.3)
Chloride: 101 mmol/L (ref 98–111)
Creatinine: 0.78 mg/dL (ref 0.44–1.00)
GFR, Est AFR Am: >60 mL/min (ref >60)
GFR, Estimated: >60 mL/min (ref >60)
Glucose, Bld: 88 mg/dL (ref 70–99)
Potassium: 3.5 mmol/L (ref 3.5–5.1)
Sodium: 135 mmol/L (ref 135–145)
Total Bilirubin: 0.5 mg/dL (ref 0.3–1.2)
Total Protein: 7.3 g/dL (ref 6.5–8.1)

## 2019-04-02 LAB — LACTATE DEHYDROGENASE: LDH: 190 U/L (ref 98–192)

## 2019-04-02 LAB — CBC WITH DIFFERENTIAL (CANCER CENTER ONLY)
Abs Immature Granulocytes: 0 10*3/uL (ref 0.00–0.07)
Basophils Absolute: 0 10*3/uL (ref 0.0–0.1)
Basophils Relative: 1 %
Eosinophils Absolute: 0.1 10*3/uL (ref 0.0–0.5)
Eosinophils Relative: 4 %
HCT: 38.2 % (ref 36.0–46.0)
Hemoglobin: 12.8 g/dL (ref 12.0–15.0)
Immature Granulocytes: 0 %
Lymphocytes Relative: 34 %
Lymphs Abs: 1 10*3/uL (ref 0.7–4.0)
MCH: 30.3 pg (ref 26.0–34.0)
MCHC: 33.5 g/dL (ref 30.0–36.0)
MCV: 90.3 fL (ref 80.0–100.0)
Monocytes Absolute: 0.2 10*3/uL (ref 0.1–1.0)
Monocytes Relative: 8 %
Neutro Abs: 1.5 10*3/uL — ABNORMAL LOW (ref 1.7–7.7)
Neutrophils Relative %: 53 %
Platelet Count: 232 10*3/uL (ref 150–400)
RBC: 4.23 MIL/uL (ref 3.87–5.11)
RDW: 14.6 % (ref 11.5–15.5)
WBC Count: 2.8 10*3/uL — ABNORMAL LOW (ref 4.0–10.5)
nRBC: 0 % (ref 0.0–0.2)

## 2019-04-02 LAB — URIC ACID: Uric Acid, Serum: 3.9 mg/dL (ref 2.5–7.1)

## 2019-04-02 LAB — PHOSPHORUS: Phosphorus: 3.9 mg/dL (ref 2.5–4.6)

## 2019-04-02 LAB — GAMMA GT: GGT: 19 U/L (ref 7–50)

## 2019-04-02 LAB — LIPASE, BLOOD: Lipase: 45 U/L (ref 11–51)

## 2019-04-02 LAB — MAGNESIUM: Magnesium: 2.1 mg/dL (ref 1.7–2.4)

## 2019-04-02 LAB — AMYLASE: Amylase: 69 U/L (ref 28–100)

## 2019-04-02 LAB — BILIRUBIN, DIRECT: Bilirubin, Direct: 0.1 mg/dL (ref 0.0–0.2)

## 2019-04-02 NOTE — Progress Notes (Unsigned)
Spoke with patient,patient confirmed that she did fast eight hours prior to labs drawn. [facility]  04/02/19

## 2019-04-02 NOTE — Progress Notes (Signed)
Patient Care Team: Patient, [name] as PCP - General (General Practice) Nicholas Lose, MD as Consulting Physician (Hematology and Oncology) Rolm Bookbinder, MD as Consulting Physician (General Surgery) Delice Bison, [name], NP as Nurse Practitioner (Hematology and Oncology) Eppie Gibson, MD as Attending Physician (Radiation Oncology)  DIAGNOSIS:    ICD-10-CM   1. Malignant neoplasm of upper-outer quadrant of left breast in female, estrogen receptor positive ([place])  C50.412    Z17.0     SUMMARY OF ONCOLOGIC HISTORY: Oncology History  Malignant neoplasm of upper-outer quadrant of left breast in female, estrogen receptor positive ([place])  01/02/2018 Initial Diagnosis   Palpable lump left breast, mammogram: Left breast UOQ 2.3 cm mass, axilla has several enlarged lymph nodes; 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   01/04/2018 Cancer Staging   Staging form: Breast, AJCC 8th Edition - Clinical: Stage IIB (cT2, cN1, cM0, G3, ER+, PR+, HER2: Equivocal) - Signed by Nicholas Lose, MD on 01/04/2018   01/16/2018 - 06/18/2018 Neo-Adjuvant Chemotherapy   Dose dense Adriamycin and Cytoxan x4 followed by Taxol, switched to Abraxane with cycle 2 after Taxol reaction with palpitations weekly x12   01/19/2018 Genetic Testing   STK11 c.608C>T VUS identified on the Multicancer panel.  The Multi-Gene Panel offered by [facility] includes sequencing and/or deletion duplication testing of the following 83 genes: ALK, APC, ATM, AXIN2,BAP1,  BARD1, BLM, BMPR1A, BRCA1, BRCA2, BRIP1, CASR, CDC73, CDH1, CDK4, CDKN1B, CDKN1C, CDKN2A (p14ARF), CDKN2A (p16INK4a), CEBPA, CHEK2, CTNNA1, DICER1, DIS3L2, EGFR (c.2369C>T, p.Thr790Met variant only), EPCAM (Deletion/duplication testing only), FH, FLCN, GATA2, GPC3, GREM1 (Promoter region deletion/duplication testing only), HOXB13 (c.251G>A, p.Gly84Glu), HRAS, KIT,  MAX, MEN1, MET, MITF (c.952G>A, p.Glu318Lys variant only), MLH1, MSH2, MSH3, MSH6, MUTYH, NBN, NF1, NF2, NTHL1, PALB2, PDGFRA, PHOX2B, PMS2, POLD1, POLE, POT1, PRKAR1A, PTCH1, PTEN, RAD50, RAD51C, RAD51D, RB1, RECQL4, RET, RUNX1, SDHAF2, SDHA (sequence changes only), SDHB, SDHC, SDHD, SMAD4, SMARCA4, SMARCB1, SMARCE1, STK11, SUFU, TERT, TERT, TMEM127, TP53, TSC1, TSC2, VHL, WRN and WT1.  The report date is January 19, 2018.   06/13/2018 Breast MRI   Significant improvement of left breast cancer previously 2.4 x 3.2 x 4 cm, currently 1.4 x 1.3 x 0.8 cm, no lymph nodes   07/02/2018 Surgery   Bilateral mastectomies: Left mastectomy: IDC grade 2, 0.9 cm, margins negative, 1/4 lymph nodes positive with extracapsular extension, margins negative ypT1b ypN1 ER 75% strong, PR 70% strong, HER-2 negative, Ki-67 60%   07/24/2018 Surgery   Left axillary lymph node dissection: 0/4 lymph nodes   09/07/2018 - 10/22/2018 Radiation Therapy   Adjuvant radiation   10/2018 -  Anti-estrogen oral therapy   Zoladex and Anastrozole   02/06/2019 -  Chemotherapy   The patient had [No matching medication found in this treatment plan]  for chemotherapy treatment.      CHIEF COMPLIANT: Follow-up of left breast cancer on [name] clinical trial  INTERVAL HISTORY: Kristi [name] is a 43 y.o. with above-mentioned history of left breast cancer treated with neoadjuvant chemotherapy, bilateral mastectomies, radiation, and who is currently on adjuvant antiestrogen therapy with Zoladex and anastrozole. She is a participant in the [place] clinical trial and was randomized to the ribociclib arm. She underwent a bilateral salpingo-oophorectomy on 03/06/19. Bone density scan on 03/15/19 showed a T-score of -1.9. She presents to the clinic today for follow-up.  She describes a numb feeling on the tongue and the metallic taste.  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 Heat  intolerance recently when she was working in the yard.  She felt short of breath and had to cool herself down.  This could be related to antiestrogen therapy. She also experienced nausea grade 2 with ribociclib.  REVIEW OF SYSTEMS:   Constitutional: Denies fevers, chills or abnormal weight loss Eyes: Denies blurriness of vision Ears, nose, mouth, throat, and face: Slight sore throat which improved.  Numbness of the tongue and metallic taste. Respiratory: Denies cough, dyspnea or wheezes Cardiovascular: Denies palpitation, chest discomfort Gastrointestinal: Nausea due to ribociclib Skin: Denies abnormal skin rashes Lymphatics: Denies new lymphadenopathy or easy bruising Neurological: Peripheral neuropathy in fingers and toes Behavioral/Psych: Mood is stable, no new changes  Extremities: No lower extremity edema Breast: denies any pain or lumps or nodules in either breasts All other systems were reviewed with the patient and are negative.  I have reviewed the past medical history, past surgical history, social history and family history with the patient and they are unchanged from previous note.  ALLERGIES:  is allergic to sulfa antibiotics; chlorhexidine gluconate; and other.  MEDICATIONS:  Current Outpatient Medications  Medication Sig Dispense Refill   anastrozole (ARIMIDEX) 1 MG tablet Take 1 tablet (1 mg total) by mouth daily. 90 tablet 3   baclofen (LIORESAL) 10 MG tablet Take by mouth.      eletriptan (RELPAX) 40 MG tablet Take 40 mg by mouth every 2 (two) hours as needed for migraine (max 2 doses per 24 hrs.).   3   EMGALITY 120 MG/ML SOAJ Inject 120 mg into the skin every 30 (thirty) days.      fexofenadine (ALLEGRA) 180 MG tablet Take 180 mg by mouth daily.      gabapentin (NEURONTIN) 300 MG capsule Take 1 capsule (300 mg total) by mouth at bedtime. 90 capsule 3   Investigational ribociclib (KISQALI) 200 MG tablet [name] Take 2 tablets (400 mg total) by mouth  daily. Take 2 tablets (479m total) by mouth daily on days 1-21.  Repeat every 28 days. 75 tablet 0   levocetirizine (XYZAL) 5 MG tablet Take 5 mg by mouth every evening.     promethazine (PHENERGAN) 25 MG tablet Take 25 mg by mouth every 6 (six) hours as needed for nausea.     venlafaxine XR (EFFEXOR-XR) 37.5 MG 24 hr capsule Take 1 capsule (37.5 mg total) by mouth daily with breakfast. 30 capsule 3   No current facility-administered medications for this visit.    Facility-Administered Medications Ordered in Other Visits  Medication Dose Route Frequency Provider Last Rate Last Dose   sodium chloride flush (NS) 0.9 % injection 10 mL  10 mL Intravenous PRN GNicholas Lose MD   10 mL at 04/13/18 1529    PHYSICAL EXAMINATION: ECOG PERFORMANCE STATUS: 1 - Symptomatic but completely ambulatory  Vitals:   04/03/19 1202  BP: 118/71  Pulse: 98  Resp: 18  Temp: 98.5 F (36.9 C)  SpO2: 98%   Filed Weights   04/03/19 1202  Weight: 141 lb 4.8 oz (64.1 kg)    GENERAL: alert, no distress and comfortable SKIN: skin color, texture, turgor are normal, no rashes or significant lesions EYES: normal, Conjunctiva are pink and non-injected, sclera clear OROPHARYNX: no exudate, no erythema and lips, buccal mucosa, and tongue normal  NECK: supple, thyroid normal size, non-tender, without nodularity LYMPH: no palpable lymphadenopathy in the cervical, axillary or inguinal LUNGS: clear to auscultation and  percussion with normal breathing effort HEART: regular rate & rhythm and no murmurs and no lower extremity edema ABDOMEN: abdomen soft, non-tender and normal bowel sounds MUSCULOSKELETAL: no cyanosis of digits and no clubbing  NEURO: alert & oriented x 3 with fluent speech, no focal motor/sensory deficits EXTREMITIES: No lower extremity edema  LABORATORY DATA:  I have reviewed the data as listed CMP Latest Ref Rng & Units 04/02/2019 03/18/2019 03/06/2019  Glucose 70 - 99 mg/dL 88 85 90  BUN 6 - 20  mg/dL _0 Creatinine 0.44 - 1.00 mg/dL 0.78 0.88 0.74  Sodium 135 - 145 mmol/L 135 137 140  Potassium 3.5 - 5.1 mmol/L 3.5 3.7 3.9  Chloride 98 - 111 mmol/L 101 101 107  CO2 22 - 32 mmol/L _1 Calcium 8.9 - 10.3 mg/dL 9.0 9.5 9.3  Total Protein 6.5 - 8.1 g/dL 7.3 7.8 -  Total Bilirubin 0.3 - 1.2 mg/dL 0.5 0.7 -  Alkaline Phos 38 - 126 U/L 95 104 -  AST 15 - 41 U/L 21 23 -  ALT 0 - 44 U/L 17 28 -    Lab Results  Component Value Date   WBC 2.8 (L) 04/02/2019   HGB 12.8 04/02/2019   HCT 38.2 04/02/2019   MCV 90.3 04/02/2019   PLT 232 04/02/2019   NEUTROABS 1.5 (L) 04/02/2019    ASSESSMENT & PLAN:  Malignant neoplasm of upper-outer quadrant of left breast in female, estrogen receptor positive ([place]) 01/02/2018: Palpable lump left breastsince December 2018, mammogram: Left breast UOQ 2.3 cm mass, axilla has several enlarged lymph nodes; 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  Status post neoadjuvant chemotherapy with dose dense Adriamycin Cytoxan x4 followed by Abraxane weekly x12  07/02/2018:Bilateral mastectomies: Left mastectomy: IDC grade 2, 0.9 cm, margins negative, 1/4 lymph nodes positive with extracapsular extension, margins negative ypT1b ypN1 ER 75% strong, PR 10% strong, HER-2 negative, Ki-67 60% 07/23/2018: Left axillary lymph node dissection: 0/4 LN Adjuvant radiation therapy 09/07/2018-10/23/2018 ----------------------------------------------------------------------------------------------------------------------------------- [name] clinical trial: Phase 3 clinical trial in patients or estrogen receptor positive randomized between standard antiestrogen therapy versus antiestrogen therapy with ribociclib 400 mg for 36 months.   Today is cycle 3 day 1 This cycle she will only receive short course of ribociclib.  Ribociclib Toxicities: 1.  Decreased  neutrophil count: Today's ANC is 1.5.  She is having bilateral salpingo-oophorectomy on 03/06/2019.  2.  Hot flashes: Related to anastrozole therapy.   3.  Nausea grade 2 due to ribociclib.  We will reduce her ribociclib dosage to 200 mg daily. 4.  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 She plans to undergo breast reconstruction surgery on 04/19/2019. We will have to hold ribociclib in anticipation of that surgery 1 week before and 1 week after.  Return to clinic 05/01/2019 for follow-up and to start cycle 4..    No orders of the defined types were placed in this encounter.  The patient has a good understanding of the overall plan. she agrees with it. she will call with any problems that may develop before the next visit here.  Nicholas Lose, MD 04/03/2019  Julious Oka Dorshimer am acting as scribe for Dr. Nicholas Lose.  I have reviewed the above documentation for accuracy and completeness, and I agree with the above.

## 2019-04-03 ENCOUNTER — Other Ambulatory Visit: Payer: Self-pay

## 2019-04-03 ENCOUNTER — Encounter: Payer: Self-pay | Admitting: *Deleted

## 2019-04-03 ENCOUNTER — Other Ambulatory Visit: Payer: Commercial Managed Care - PPO

## 2019-04-03 ENCOUNTER — Inpatient Hospital Stay (HOSPITAL_BASED_OUTPATIENT_CLINIC_OR_DEPARTMENT_OTHER): Payer: Commercial Managed Care - PPO | Admitting: Hematology and Oncology

## 2019-04-03 DIAGNOSIS — C50412 Malignant neoplasm of upper-outer quadrant of left female breast: Secondary | ICD-10-CM

## 2019-04-03 DIAGNOSIS — Z923 Personal history of irradiation: Secondary | ICD-10-CM

## 2019-04-03 DIAGNOSIS — G629 Polyneuropathy, unspecified: Secondary | ICD-10-CM

## 2019-04-03 DIAGNOSIS — Z170 Estrogen receptor positive status [ER+]: Secondary | ICD-10-CM

## 2019-04-03 DIAGNOSIS — N951 Menopausal and female climacteric states: Secondary | ICD-10-CM

## 2019-04-03 DIAGNOSIS — D701 Agranulocytosis secondary to cancer chemotherapy: Secondary | ICD-10-CM

## 2019-04-03 DIAGNOSIS — Z006 Encounter for examination for normal comparison and control in clinical research program: Secondary | ICD-10-CM

## 2019-04-03 DIAGNOSIS — Z9013 Acquired absence of bilateral breasts and nipples: Secondary | ICD-10-CM

## 2019-04-03 DIAGNOSIS — R110 Nausea: Secondary | ICD-10-CM

## 2019-04-03 MED ORDER — INV-RIBOCICLIB 200 MG TAB NATALEE TRIO003 STUDY: 400.0000 mg | ORAL_TABLET | Freq: Every day | ORAL | 0 refills | Status: DC

## 2019-04-03 NOTE — Research (Addendum)
Cycle 3, Day 1Assessment forNATALEE STUDY: Patient into clinic by herself this morning to complete the study activities prior to starting on cycle 3.  Labs: Fasting labs were collected yesterday on 04/02/19 per protocol. Results reviewed by Dr. Lindi Adie and he states WBC of 2.8 is related to Ribociclib but he does not consider this result clinically significant. Dr. Lindi Adie states no interventions or change in treatment necessary for this result.  Dr. Lindi Adie states ANC 1.5 is related to Ribociclib and per ok to continue treatment with Ribociclib per study parameters. All other lab results were wnl.  Corrected Calcium Formula:  4 g/dL utilized for "normal albumin;" local normal range is 3.5-5 g/dL. Corrected Calcium = (0.8 x [normal albumin - patient's albumin]) + serum calcium Correct Calcium = (0.8 x[4- 4.6]) + 9.0 Corrected Calcium = 8.52 VS: Completed per protocol.  MD: Patient seen by Lindi Adie today. See his encounter dated today for details.  Con Meds:  Patient reports Dr. Carlis Abbott (GYN) increased dose of Venlafaxine for hot flashes. No other changes since last visit. Medication list updated.Reminded patient to notify research nurse of any new medications prior to taking them unless it is an emergency. She verbalized understanding.  AEs:  Although patient reports hot flashes have not been controlled on lower dose of Venlafaxine, the grade has not increased. Patient reports persistent ongoing nausea for which she is taking phenergan at least once daily and although it has not affected her ability to eat or drink, patient says it is affecting her quality of life. See AE table below. Pre-dose HEN:IDPOEUMPN at 12:35 pm and QTcF = 454m.Dr. GLindi Adiereviewed and states ok tostartRibociclib Cycle 3  today.   Study Drug: Patient stated she has taken Ribociclib and Anastrozole as directed and has not missed any scheduled doses.  This is reflected on her medication diary and pill count of returned Ribociclib  bottle of 47 pills. (cycle 2 was taken for total of 14 days instead of 21 due to surgery).   Dr. GLindi Adiereduced dose of Ribociclib to 200 mg daily today due to ongoing grade 2 nausea. IRT assigned kit #[id number] Medication was dispensed by pharmacist, BRaul Del Medication, dose, kit#, expiration date, patient name and study ID all double checked with Brandy. Bottle kit #[id number] 47 pills returned to [facility] recording and storing in pharmacy.  Patient took dose of Ribociclib and Anastrozole in clinic at 1:30 pm and documented in medication diary.  Patient understands dose reduction and taking only one tablet (200 mg) of Ribociclib daily.  Plan: Breast reconstruction surgery scheduled for 04/19/19. Dr. GLindi Adieinstructs for patient to take Ribociclib Cycle 3 starting today 04/03/19 through 04/11/19, then hold x 7 days prior to surgery.  Patient will continue to hold Ribociclib post op and not resume until seen in clinic again for Cycle 4 on 05/01/19.  Instructed patient to call if any questions or concerns prior to next visit. She verbalized understanding.  Thanked patient for her time today.  AEs: Event Grade Onset Date End Date Attribution to Goserelin  Attribution to Anastrozole  Attributionto Ribociclib Status Comments  Allergic Rhinitis 2 2005  No No No Ongoing; baseline   Headache 1 2000  No No No Ongoing; basline   Neuropathy hands and feet 1 06/01/18  N/A No No Ongoing; baseline From prior chemo  Hot Flashes 2 12/2017  Yes Yes No Ongoing; baseline   Sore Throat 1 02/27/19 02/28/19 N/A No No Resolved   Nausea 1 03/11/19 03/26/19 N/A No  No Increased   Dysgeusia 1 03/13/19  N/A No No Ongoing Metallic taste  Tachycardia 1 03/06/19 04/03/19 N/A No No Resolved Start date updated per new information from GYN notes.  Decreased WBC 1 03/18/19 04/03/19 N/A No Yes Increased Not Clinically Significant  Hypertension 1 03/20/19 04/03/19 N/A No No Resolved   Mouth Sore 1 03/21/19  N/A No No Ongoing   Nausea 2 03/26/19   N/A No Yes Ongoing Decreased Ribociclib Dose  Decreased WBC 2 04/02/19  N/A No Yes Ongoing Not Clinically Significant  Heat Intolerance 1 03/27/19  N/A Yes No Ongoing   Decreased Neutrophils 1 04/02/19  N/A No Yes Ongoing    [name], BSN, RN Clinical Research Nurse 04/03/2019

## 2019-04-03 NOTE — H&P (Addendum)
Subjective:     Patient ID: Kristi [name] is a 43 y.o. female.  HPI  9 months post op bilateral mastectomies with immediate expander based reconstruction. Completed adjuvant radiation 3.30.20. Scheduled for second stage breast reconstruction this month.   Presented with palpable mass left breast. MMG/US with left breast UOQ 2.3 cm mass, axilla with several enlarged LN; biopsy IDC with perineural invasion, LN also positive for IDC, ER/PR+,  HER-2 IHC equivocal.  MRI necrotic mass measuring 2.4 x 3.2 x 4 cm in the posterior UOQ left breast with 4 abnormal LN noted.   Staging scans negative.  Completed neoadjuvant chemotherapy. Final MRIdemonstrated NMEmeasuring a total of 1.4 x 1.3 x 0.8cm, interval resolution of LEFT axillary adenopathy.  Final pathology left breast 0.9 cm IDC, left SLN with +1/4 with extracapsular extension. Underwent subsequent ALND with 0/4 LN.   Genetics with STK11 VUS.  On zoladex and anatrazole. Participating in [place] trial.   Prior B cup, desires C. Right mastectomy 313 g  Left 334 g   Wt down 30 lb over last year.  Lives with parents. 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     Objective:   Physical Exam CV: normal heart sounds PULM: clear to auscultation Chest: expanded bilateral, soft bilateral, left chest hyperpigmentation, soft tissue mobile over expander no visible rippling depressed contour left axilla SN to nipple R 25 L 25 cm Nipple to IMF R 7.5 L 7.5 cm    Assessment:     Left breast ca UOQ ER+ Neoadjuvant chemotherapy S/p bilateral NSM, left SLN, prepectoral TE/ADM (Alloderm) reconstruction S/p ALND Adjuvant radiation    Plan:     WBC 9.8.20 2.8, ANC 1.5- Dr. Lindi Adie has reduced dose and instructed to hold 1 week prior.  Plan removal bilateral chest tissue expanders, placement silicone implants, lipofilling to bilateral chest.   Reviewed radiation increase risks reconstruction  including wound healing problems, capsular contracture.Planwait approximately 6 months from end XRT for implant exchange. Counseled encouraging data with prepectoral position, use ADM with regards to radiation. Howeversome type of autologous tissue reconstruction post radiation can decrease risks back to baseline. Reviewed options of implant exchange alone,LD flap over implanton right, or referral to microsurgeon to discuss other autologous options.Patient has elected for implant exchange alone.  Reviewed saline vs silicone, shaped vs round.HCG or capacity filled silicone implants may offer reduced risk visible rippling. Reviewed MRI or USsurveillance for rupture with silicone implants. Reviewed examples for 4th generation, capacity filled 4th generation, and HCG implants vs saline implants. Reviewed risks AP flipping that may be more noticeable with 5th generation implants, may require surgery to correct. Plan at this time smooth round silicone implants, plan capacity filled.Reviewed size in part guided by width chest, cannot assure her cup size  Reviewed fat grafting. Reviewed purpose of this to thicken flaps limit visible rippling and some studies showing this may reduce radiation fibrosis. Reviewed donor site pain, need for compression, variable take graft, fat necrosis that presents as masses and may require work up. Plan fat grafting bilateral chest. Plan donor site supra and infraumbilical abdomen . Recommend she purchanse Spanx type garment for post op use.   Additional risks including but not limited to bleeding, seroma, hematoma, damage to adjacent structures, asymmetry, DVT/PE, unacceptable cosmetic result reviewed.  Discussed risk COVID infectionthrough this elective surgery. Patient will receive COVID testing prior to surgery. Discussed even if patient receivesa negative test result, the tests in some cases may fail to detect the virus  or patient maycontract COVID after the  test.COVID 19 infectionbefore/during/aftersurgery may result in lead to a higher chance of complication and death.  Rx for oxycodone and Cipro given  Plan OP surgery. No drains anticipated.  Irene Limbo, MD [facility] Plastic [facility] Reconstruct[facility] Surgery [phone], pin [phone]

## 2019-04-03 NOTE — Assessment & Plan Note (Signed)
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  Status post neoadjuvant chemotherapy with dose dense Adriamycin Cytoxan x4 followed by Abraxane weekly x12  07/02/2018:Bilateral mastectomies: Left mastectomy: IDC grade 2, 0.9 cm, margins negative, 1/4 lymph nodes positive with extracapsular extension, margins negative ypT1b ypN1 ER 75% strong, PR 10% strong, HER-2 negative, Ki-67 60% 07/23/2018: Left axillary lymph node dissection: 0/4 LN Adjuvant radiation therapy 09/07/2018-10/23/2018 ----------------------------------------------------------------------------------------------------------------------------------- [name] clinical trial: Phase 3 clinical trial in patients or estrogen receptor positive randomized between standard antiestrogen therapy versus antiestrogen therapy with ribociclib 400 mg for 36 months.   Today is cycle 3 day 1  Ribociclib Toxicities: 1.  Decreased neutrophil count: Today's ANC is 1.2.  By the clinical trial protocol we will continue with the same dosage. She is having bilateral salpingo-oophorectomy on 03/06/2019. She will resume her new treatment on 03/13/2019.  2.  Hot flashes: Related to anastrozole therapy.  She is currently on Effexor which is helping but is not completely make it better.  She rates his hot flashes is moderate she experiences 6-12 episodes per day.  They are short lasting.  She plans to undergo breast reconstruction surgery on 04/19/2019. We will have to hold ribociclib in anticipation of that surgery 1 week before and 1 week after.  Zoladex has been discontinued. Return to clinic 04/03/2019 for follow-up based on clinical trial requirement.

## 2019-04-04 ENCOUNTER — Telehealth: Payer: Self-pay | Admitting: Hematology and Oncology

## 2019-04-04 NOTE — Telephone Encounter (Signed)
I talk with patient regarding schedule  

## 2019-04-09 ENCOUNTER — Other Ambulatory Visit: Payer: Self-pay | Admitting: Hematology and Oncology

## 2019-04-09 ENCOUNTER — Encounter: Payer: Self-pay | Admitting: Hematology and Oncology

## 2019-04-09 MED ORDER — INV-RIBOCICLIB 200 MG TAB NATALEE TRIO003 STUDY: 200.0000 mg | ORAL_TABLET | Freq: Every day | ORAL | 0 refills | Status: DC

## 2019-04-12 ENCOUNTER — Other Ambulatory Visit: Payer: Self-pay

## 2019-04-12 ENCOUNTER — Encounter (HOSPITAL_BASED_OUTPATIENT_CLINIC_OR_DEPARTMENT_OTHER): Payer: Self-pay | Admitting: *Deleted

## 2019-04-16 ENCOUNTER — Other Ambulatory Visit (HOSPITAL_COMMUNITY)
Admission: RE | Admit: 2019-04-16 | Discharge: 2019-04-16 | Disposition: A | Discharge status: Home / Self Care | Payer: Commercial Managed Care - PPO | Source: Ambulatory Visit | Attending: Plastic Surgery | Admitting: Plastic Surgery

## 2019-04-16 ENCOUNTER — Encounter (HOSPITAL_BASED_OUTPATIENT_CLINIC_OR_DEPARTMENT_OTHER)
Admission: RE | Admit: 2019-04-16 | Discharge: 2019-04-16 | Disposition: A | Discharge status: Home / Self Care | Payer: Commercial Managed Care - PPO | Source: Ambulatory Visit | Attending: Plastic Surgery | Admitting: Plastic Surgery

## 2019-04-16 ENCOUNTER — Other Ambulatory Visit: Payer: Self-pay

## 2019-04-16 DIAGNOSIS — Z853 Personal history of malignant neoplasm of breast: Secondary | ICD-10-CM | POA: Diagnosis not present

## 2019-04-16 DIAGNOSIS — Z9010 Acquired absence of unspecified breast and nipple: Secondary | ICD-10-CM | POA: Insufficient documentation

## 2019-04-16 DIAGNOSIS — Z01812 Encounter for preprocedural laboratory examination: Secondary | ICD-10-CM | POA: Insufficient documentation

## 2019-04-16 DIAGNOSIS — Z20828 Contact with and (suspected) exposure to other viral communicable diseases: Secondary | ICD-10-CM | POA: Insufficient documentation

## 2019-04-16 DIAGNOSIS — Z923 Personal history of irradiation: Secondary | ICD-10-CM | POA: Insufficient documentation

## 2019-04-16 LAB — CBC WITH DIFFERENTIAL/PLATELET
Abs Immature Granulocytes: 0.01 10*3/uL (ref 0.00–0.07)
Basophils Absolute: 0.1 10*3/uL (ref 0.0–0.1)
Basophils Relative: 1 %
Eosinophils Absolute: 0.1 10*3/uL (ref 0.0–0.5)
Eosinophils Relative: 3 %
HCT: 40.3 % (ref 36.0–46.0)
Hemoglobin: 14 g/dL (ref 12.0–15.0)
Immature Granulocytes: 0 %
Lymphocytes Relative: 28 %
Lymphs Abs: 1 10*3/uL (ref 0.7–4.0)
MCH: 32 pg (ref 26.0–34.0)
MCHC: 34.7 g/dL (ref 30.0–36.0)
MCV: 92.2 fL (ref 80.0–100.0)
Monocytes Absolute: 0.3 10*3/uL (ref 0.1–1.0)
Monocytes Relative: 7 %
Neutro Abs: 2.2 10*3/uL (ref 1.7–7.7)
Neutrophils Relative %: 61 %
Platelets: 374 10*3/uL (ref 150–400)
RBC: 4.37 MIL/uL (ref 3.87–5.11)
RDW: 14 % (ref 11.5–15.5)
WBC: 3.7 10*3/uL — ABNORMAL LOW (ref 4.0–10.5)
nRBC: 0 % (ref 0.0–0.2)

## 2019-04-16 NOTE — Progress Notes (Signed)

## 2019-04-17 LAB — NOVEL CORONAVIRUS, NAA (HOSP ORDER, SEND-OUT TO REF LAB; TAT 18-24 HRS): SARS-CoV-2, NAA: NOT DETECTED

## 2019-04-19 ENCOUNTER — Encounter (HOSPITAL_BASED_OUTPATIENT_CLINIC_OR_DEPARTMENT_OTHER): Payer: Self-pay

## 2019-04-19 ENCOUNTER — Encounter (HOSPITAL_BASED_OUTPATIENT_CLINIC_OR_DEPARTMENT_OTHER)
Admission: RE | Disposition: A | Discharge status: Home / Self Care | Payer: Self-pay | Source: Home / Self Care | Attending: Plastic Surgery

## 2019-04-19 ENCOUNTER — Ambulatory Visit (HOSPITAL_BASED_OUTPATIENT_CLINIC_OR_DEPARTMENT_OTHER)
Admission: RE | Admit: 2019-04-19 | Discharge: 2019-04-19 | Disposition: A | Discharge status: Home / Self Care | Payer: Commercial Managed Care - PPO | Attending: Plastic Surgery | Admitting: Plastic Surgery

## 2019-04-19 ENCOUNTER — Ambulatory Visit (HOSPITAL_BASED_OUTPATIENT_CLINIC_OR_DEPARTMENT_OTHER): Payer: Commercial Managed Care - PPO | Admitting: Certified Registered"

## 2019-04-19 ENCOUNTER — Other Ambulatory Visit: Payer: Self-pay

## 2019-04-19 DIAGNOSIS — Z45812 Encounter for adjustment or removal of left breast implant: Secondary | ICD-10-CM | POA: Diagnosis present

## 2019-04-19 DIAGNOSIS — F419 Anxiety disorder, unspecified: Secondary | ICD-10-CM | POA: Diagnosis not present

## 2019-04-19 DIAGNOSIS — Z45811 Encounter for adjustment or removal of right breast implant: Secondary | ICD-10-CM | POA: Diagnosis not present

## 2019-04-19 DIAGNOSIS — Z9013 Acquired absence of bilateral breasts and nipples: Secondary | ICD-10-CM | POA: Insufficient documentation

## 2019-04-19 DIAGNOSIS — F329 Major depressive disorder, single episode, unspecified: Secondary | ICD-10-CM | POA: Diagnosis not present

## 2019-04-19 DIAGNOSIS — Z853 Personal history of malignant neoplasm of breast: Secondary | ICD-10-CM | POA: Diagnosis not present

## 2019-04-19 DIAGNOSIS — Z9221 Personal history of antineoplastic chemotherapy: Secondary | ICD-10-CM | POA: Insufficient documentation

## 2019-04-19 DIAGNOSIS — Z923 Personal history of irradiation: Secondary | ICD-10-CM | POA: Diagnosis not present

## 2019-04-19 HISTORY — PX: LIPOSUCTION WITH LIPOFILLING: SHX6436

## 2019-04-19 HISTORY — PX: REMOVAL OF BILATERAL TISSUE EXPANDERS WITH PLACEMENT OF BILATERAL BREAST IMPLANTS: SHX6431

## 2019-04-19 SURGERY — REMOVAL, TISSUE EXPANDER, BREAST, BILATERAL, WITH BILATERAL IMPLANT IMPLANT INSERTION
Anesthesia: General | Site: Chest | Laterality: Bilateral

## 2019-04-19 MED ORDER — ACETAMINOPHEN 500 MG PO TABS
1000.0000 mg | ORAL_TABLET | ORAL | Status: AC
  Administered 2019-04-19: 1000 mg via ORAL

## 2019-04-19 MED ORDER — SUGAMMADEX SODIUM 500 MG/5ML IV SOLN
INTRAVENOUS | Status: AC
  Filled 2019-04-19: qty 10

## 2019-04-19 MED ORDER — HYDROMORPHONE HCL 1 MG/ML IJ SOLN
0.2500 mg | INTRAMUSCULAR | Status: DC | PRN
  Administered 2019-04-19 (×2): 0.5 mg via INTRAVENOUS

## 2019-04-19 MED ORDER — SCOPOLAMINE 1 MG/3DAYS TD PT72
1.0000 | MEDICATED_PATCH | Freq: Once | TRANSDERMAL | Status: DC
  Administered 2019-04-19: 1.5 mg via TRANSDERMAL

## 2019-04-19 MED ORDER — GABAPENTIN 300 MG PO CAPS
ORAL_CAPSULE | ORAL | Status: AC
  Filled 2019-04-19: qty 1

## 2019-04-19 MED ORDER — LIDOCAINE HCL (PF) 1 % IJ SOLN
INTRAMUSCULAR | Status: AC
  Filled 2019-04-19: qty 60

## 2019-04-19 MED ORDER — PROPOFOL 500 MG/50ML IV EMUL
INTRAVENOUS | Status: DC | PRN
  Administered 2019-04-19: 150 ug/kg/min via INTRAVENOUS
  Administered 2019-04-19: 12:00:00 via INTRAVENOUS

## 2019-04-19 MED ORDER — GABAPENTIN 300 MG PO CAPS
300.0000 mg | ORAL_CAPSULE | ORAL | Status: AC
  Administered 2019-04-19: 300 mg via ORAL

## 2019-04-19 MED ORDER — MEPERIDINE HCL 25 MG/ML IJ SOLN: 6.2500 mg | INTRAMUSCULAR | Status: DC | PRN

## 2019-04-19 MED ORDER — CEFAZOLIN SODIUM-DEXTROSE 2-4 GM/100ML-% IV SOLN
INTRAVENOUS | Status: AC
  Filled 2019-04-19: qty 100

## 2019-04-19 MED ORDER — FENTANYL CITRATE (PF) 100 MCG/2ML IJ SOLN
50.0000 ug | INTRAMUSCULAR | Status: AC | PRN
  Administered 2019-04-19 (×3): 25 ug via INTRAVENOUS
  Administered 2019-04-19: 100 ug via INTRAVENOUS

## 2019-04-19 MED ORDER — PROMETHAZINE HCL 25 MG/ML IJ SOLN: 6.2500 mg | INTRAMUSCULAR | Status: DC | PRN

## 2019-04-19 MED ORDER — SODIUM BICARBONATE 4.2 % IV SOLN
INTRAVENOUS | Status: AC
  Filled 2019-04-19: qty 10

## 2019-04-19 MED ORDER — ONDANSETRON HCL 4 MG/2ML IJ SOLN
INTRAMUSCULAR | Status: DC | PRN
  Administered 2019-04-19: 4 mg via INTRAVENOUS

## 2019-04-19 MED ORDER — LACTATED RINGERS IV SOLN
INTRAVENOUS | Status: DC
  Administered 2019-04-19 (×2): via INTRAVENOUS

## 2019-04-19 MED ORDER — MIDAZOLAM HCL 2 MG/2ML IJ SOLN
1.0000 mg | INTRAMUSCULAR | Status: DC | PRN
  Administered 2019-04-19: 2 mg via INTRAVENOUS

## 2019-04-19 MED ORDER — FENTANYL CITRATE (PF) 100 MCG/2ML IJ SOLN
INTRAMUSCULAR | Status: AC
  Filled 2019-04-19: qty 2

## 2019-04-19 MED ORDER — LIDOCAINE HCL (CARDIAC) PF 100 MG/5ML IV SOSY
PREFILLED_SYRINGE | INTRAVENOUS | Status: DC | PRN
  Administered 2019-04-19: 60 mg via INTRAVENOUS

## 2019-04-19 MED ORDER — MIDAZOLAM HCL 2 MG/2ML IJ SOLN
INTRAMUSCULAR | Status: AC
  Filled 2019-04-19: qty 2

## 2019-04-19 MED ORDER — ACETAMINOPHEN 500 MG PO TABS
ORAL_TABLET | ORAL | Status: AC
  Filled 2019-04-19: qty 2

## 2019-04-19 MED ORDER — ROCURONIUM BROMIDE 100 MG/10ML IV SOLN
INTRAVENOUS | Status: DC | PRN
  Administered 2019-04-19: 20 mg via INTRAVENOUS
  Administered 2019-04-19: 60 mg via INTRAVENOUS

## 2019-04-19 MED ORDER — ACETAMINOPHEN 500 MG PO TABS: 1000.0000 mg | ORAL_TABLET | Freq: Once | ORAL | Status: DC

## 2019-04-19 MED ORDER — MIDAZOLAM HCL 2 MG/2ML IJ SOLN: 0.5000 mg | Freq: Once | INTRAMUSCULAR | Status: DC | PRN

## 2019-04-19 MED ORDER — DEXAMETHASONE SODIUM PHOSPHATE 4 MG/ML IJ SOLN
INTRAMUSCULAR | Status: DC | PRN
  Administered 2019-04-19: 10 mg via INTRAVENOUS

## 2019-04-19 MED ORDER — NEOSTIGMINE METHYLSULFATE 10 MG/10ML IV SOLN
INTRAVENOUS | Status: DC | PRN
  Administered 2019-04-19: 3.5 mg via INTRAVENOUS

## 2019-04-19 MED ORDER — HYDROMORPHONE HCL 1 MG/ML IJ SOLN
INTRAMUSCULAR | Status: AC
  Filled 2019-04-19: qty 0.5

## 2019-04-19 MED ORDER — PROPOFOL 10 MG/ML IV BOLUS
INTRAVENOUS | Status: DC | PRN
  Administered 2019-04-19: 40 mg via INTRAVENOUS
  Administered 2019-04-19: 200 mg via INTRAVENOUS

## 2019-04-19 MED ORDER — CELECOXIB 200 MG PO CAPS
200.0000 mg | ORAL_CAPSULE | ORAL | Status: AC
  Administered 2019-04-19: 200 mg via ORAL

## 2019-04-19 MED ORDER — CELECOXIB 200 MG PO CAPS
ORAL_CAPSULE | ORAL | Status: AC
  Filled 2019-04-19: qty 1

## 2019-04-19 MED ORDER — ARTIFICIAL TEARS OPHTHALMIC OINT
TOPICAL_OINTMENT | OPHTHALMIC | Status: AC
  Filled 2019-04-19: qty 17.5

## 2019-04-19 MED ORDER — GLYCOPYRROLATE 0.2 MG/ML IJ SOLN
INTRAMUSCULAR | Status: DC | PRN
  Administered 2019-04-19: .5 mg via INTRAVENOUS

## 2019-04-19 MED ORDER — SCOPOLAMINE 1 MG/3DAYS TD PT72
MEDICATED_PATCH | TRANSDERMAL | Status: AC
  Filled 2019-04-19: qty 1

## 2019-04-19 MED ORDER — SODIUM CHLORIDE 0.9 % IV SOLN
INTRAVENOUS | Status: DC | PRN
  Administered 2019-04-19: 1000 mL

## 2019-04-19 MED ORDER — SODIUM BICARBONATE 4 % IV SOLN
INTRAVENOUS | Status: DC | PRN
  Administered 2019-04-19: 400 mL via INTRAMUSCULAR

## 2019-04-19 MED ORDER — CEFAZOLIN SODIUM-DEXTROSE 2-4 GM/100ML-% IV SOLN
2.0000 g | INTRAVENOUS | Status: AC
  Administered 2019-04-19: 2 g via INTRAVENOUS

## 2019-04-19 SURGICAL SUPPLY — 90 items
BAG DECANTER FOR FLEXI CONT (MISCELLANEOUS) ×3 IMPLANT
BINDER ABDOMINAL 10 UNV 27-48 (MISCELLANEOUS) IMPLANT
BINDER ABDOMINAL 12 SM 30-45 (SOFTGOODS) ×1 IMPLANT
BINDER BREAST 3XL (GAUZE/BANDAGES/DRESSINGS) IMPLANT
BINDER BREAST LRG (GAUZE/BANDAGES/DRESSINGS) IMPLANT
BINDER BREAST MEDIUM (GAUZE/BANDAGES/DRESSINGS) IMPLANT
BINDER BREAST XLRG (GAUZE/BANDAGES/DRESSINGS) ×1 IMPLANT
BINDER BREAST XXLRG (GAUZE/BANDAGES/DRESSINGS) IMPLANT
BLADE SURG 10 STRL SS (BLADE) ×6 IMPLANT
BLADE SURG 11 STRL SS (BLADE) ×3 IMPLANT
BNDG GAUZE ELAST 4 BULKY (GAUZE/BANDAGES/DRESSINGS) ×6 IMPLANT
CANISTER LIPO FAT HARVEST (MISCELLANEOUS) ×1 IMPLANT
CANISTER SUCT 1200ML W/VALVE (MISCELLANEOUS) ×3 IMPLANT
CHLORAPREP W/TINT 26 (MISCELLANEOUS) ×4 IMPLANT
COVER BACK TABLE REUSABLE LG (DRAPES) ×3 IMPLANT
COVER MAYO STAND REUSABLE (DRAPES) ×6 IMPLANT
COVER WAND RF STERILE (DRAPES) IMPLANT
DECANTER SPIKE VIAL GLASS SM (MISCELLANEOUS) IMPLANT
DERMABOND ADVANCED (GAUZE/BANDAGES/DRESSINGS)
DERMABOND ADVANCED .7 DNX12 (GAUZE/BANDAGES/DRESSINGS) ×4 IMPLANT
DRAIN CHANNEL 15F RND FF W/TCR (WOUND CARE) IMPLANT
DRAPE HALF SHEET 70X43 (DRAPES) ×6 IMPLANT
DRAPE TOP ARMCOVERS (MISCELLANEOUS) ×3 IMPLANT
DRAPE U-SHAPE 76X120 STRL (DRAPES) ×3 IMPLANT
DRAPE UTILITY XL STRL (DRAPES) ×4 IMPLANT
DRSG PAD ABDOMINAL 8X10 ST (GAUZE/BANDAGES/DRESSINGS) ×6 IMPLANT
DURAPREP 26ML APPLICATOR (WOUND CARE) ×2 IMPLANT
ELECT BLADE 4.0 EZ CLEAN MEGAD (MISCELLANEOUS)
ELECT COATED BLADE 2.86 ST (ELECTRODE) ×3 IMPLANT
ELECT REM PT RETURN 9FT ADLT (ELECTROSURGICAL) ×3
ELECTRODE BLDE 4.0 EZ CLN MEGD (MISCELLANEOUS) ×2 IMPLANT
ELECTRODE REM PT RTRN 9FT ADLT (ELECTROSURGICAL) ×2 IMPLANT
EVACUATOR SILICONE 100CC (DRAIN) IMPLANT
EXTRACTOR CANIST REVOLVE STRL (CANNISTER) IMPLANT
GLOVE BIO SURGEON STRL SZ 6 (GLOVE) ×10 IMPLANT
GLOVE BIO SURGEON STRL SZ7 (GLOVE) ×3 IMPLANT
GLOVE BIOGEL PI IND STRL 7.5 (GLOVE) IMPLANT
GLOVE BIOGEL PI INDICATOR 7.5 (GLOVE) ×1
GOWN STRL REUS W/ TWL LRG LVL3 (GOWN DISPOSABLE) ×4 IMPLANT
GOWN STRL REUS W/ TWL XL LVL3 (GOWN DISPOSABLE) IMPLANT
GOWN STRL REUS W/TWL LRG LVL3 (GOWN DISPOSABLE) ×1
GOWN STRL REUS W/TWL XL LVL3 (GOWN DISPOSABLE) ×1
IMPL BREAST SILICONE 495CC (Breast) IMPLANT
IMPLANT BREAST SILICONE 495CC (Breast) ×6 IMPLANT
IV NS 500ML (IV SOLUTION)
IV NS 500ML BAXH (IV SOLUTION) IMPLANT
KIT FILL SYSTEM UNIVERSAL (SET/KITS/TRAYS/PACK) IMPLANT
LINER CANISTER 1000CC FLEX (MISCELLANEOUS) ×3 IMPLANT
MARKER SKIN DUAL TIP RULER LAB (MISCELLANEOUS) IMPLANT
NDL FILTER BLUNT 18X1 1/2 (NEEDLE) ×2 IMPLANT
NDL HYPO 25X1 1.5 SAFETY (NEEDLE) IMPLANT
NDL SAFETY ECLIPSE 18X1.5 (NEEDLE) ×2 IMPLANT
NEEDLE FILTER BLUNT 18X 1/2SAF (NEEDLE) ×1
NEEDLE FILTER BLUNT 18X1 1/2 (NEEDLE) ×2 IMPLANT
NEEDLE HYPO 18GX1.5 SHARP (NEEDLE) ×1
NEEDLE HYPO 25X1 1.5 SAFETY (NEEDLE) IMPLANT
NS IRRIG 1000ML POUR BTL (IV SOLUTION) IMPLANT
PACK BASIN DAY SURGERY FS (CUSTOM PROCEDURE TRAY) ×3 IMPLANT
PAD ALCOHOL SWAB (MISCELLANEOUS) ×3 IMPLANT
PENCIL BUTTON HOLSTER BLD 10FT (ELECTRODE) ×3 IMPLANT
PIN SAFETY STERILE (MISCELLANEOUS) IMPLANT
PUNCH BIOPSY DERMAL 4MM (MISCELLANEOUS) IMPLANT
SIZER BREAST REUSE 495CC (SIZER) ×3
SIZER BREAST REUSE GEL 525CC (SIZER) ×3
SIZER BRST REUSE 495CC (SIZER) IMPLANT
SIZER BRST REUSE GEL 525CC (SIZER) IMPLANT
SLEEVE SCD COMPRESS KNEE MED (MISCELLANEOUS) ×3 IMPLANT
SPONGE LAP 18X18 RF (DISPOSABLE) ×6 IMPLANT
STAPLER VISISTAT 35W (STAPLE) ×3 IMPLANT
SUT ETHILON 2 0 FS 18 (SUTURE) IMPLANT
SUT MNCRL AB 4-0 PS2 18 (SUTURE) ×6 IMPLANT
SUT PDS AB 2-0 CT2 27 (SUTURE) IMPLANT
SUT VIC AB 3-0 PS1 18 (SUTURE)
SUT VIC AB 3-0 PS1 18XBRD (SUTURE) IMPLANT
SUT VIC AB 3-0 SH 27 (SUTURE) ×2
SUT VIC AB 3-0 SH 27X BRD (SUTURE) ×4 IMPLANT
SUT VICRYL 4-0 PS2 18IN ABS (SUTURE) ×6 IMPLANT
SYR 10ML LL (SYRINGE) ×9 IMPLANT
SYR 20ML LL LF (SYRINGE) ×1 IMPLANT
SYR 50ML LL SCALE MARK (SYRINGE) ×4 IMPLANT
SYR BULB IRRIGATION 50ML (SYRINGE) ×6 IMPLANT
SYR CONTROL 10ML LL (SYRINGE) IMPLANT
SYR TB 1ML LL NO SAFETY (SYRINGE) ×3 IMPLANT
SYRINGE TOOMEY DISP (SYRINGE) IMPLANT
TOWEL GREEN STERILE FF (TOWEL DISPOSABLE) ×6 IMPLANT
TUBE CONNECTING 20X1/4 (TUBING) ×6 IMPLANT
TUBING INFILTRATION IT-10001 (TUBING) ×3 IMPLANT
TUBING SET GRADUATE ASPIR 12FT (MISCELLANEOUS) ×3 IMPLANT
UNDERPAD 30X36 HEAVY ABSORB (UNDERPADS AND DIAPERS) ×6 IMPLANT
YANKAUER SUCT BULB TIP NO VENT (SUCTIONS) ×3 IMPLANT

## 2019-04-19 NOTE — Interval H&P Note (Signed)
History and Physical Interval Note:  04/19/2019 10:11 AM  Kristi [name]  has presented today for surgery, with the diagnosis of history breast cancer, acquired absence breasts, history therapeutic radiation.  The various methods of treatment have been discussed with the patient and family. After consideration of risks, benefits and other options for treatment, the patient has consented to  Procedure(s): REMOVAL OF BILATERAL TISSUE EXPANDERS WITH PLACEMENT OF BILATERAL SILICONE BREAST IMPLANTS (Bilateral) LIPOFILLING TO BILATERAL CHEST (Bilateral) as a surgical intervention.  The patient's history has been reviewed, patient examined, no change in status, stable for surgery.  I have reviewed the patient's chart and labs.  Questions were answered to the patient's satisfaction.     Arnoldo Hooker Dontario Evetts

## 2019-04-19 NOTE — Transfer of Care (Signed)
Immediate Anesthesia Transfer of Care Note  Patient: Kristi [name]  Procedure(s) Performed: REMOVAL OF BILATERAL TISSUE EXPANDERS WITH PLACEMENT OF BILATERAL SILICONE BREAST IMPLANTS (Bilateral Breast) LIPOFILLING FROM ABDOMEN TO BILATERAL CHEST (Bilateral Chest)  Patient Location: PACU  Anesthesia Type:General  Level of Consciousness: awake, alert , oriented and patient cooperative  Airway & Oxygen Therapy: Patient Spontanous Breathing and Patient connected to face mask oxygen  Post-op Assessment: Report given to RN and Post -op Vital signs reviewed and stable  Post vital signs: Reviewed and stable  Last Vitals:  Vitals Value Taken Time  BP 146/100 04/19/19 1307  Temp    Pulse 112 04/19/19 1310  Resp 16 04/19/19 1310  SpO2 100 % 04/19/19 1310  Vitals shown include unvalidated device data.  Last Pain:  Vitals:   04/19/19 0927  TempSrc: Oral      Patients Stated Pain Goal: 5 ([id number])  Complications: No apparent anesthesia complications

## 2019-04-19 NOTE — Anesthesia Postprocedure Evaluation (Signed)
Anesthesia Post Note  Patient: Brunilda Montierth Givhan  Procedure(s) Performed: REMOVAL OF BILATERAL TISSUE EXPANDERS WITH PLACEMENT OF BILATERAL SILICONE BREAST IMPLANTS (Bilateral Breast) LIPOFILLING FROM ABDOMEN TO BILATERAL CHEST (Bilateral Chest)     Patient location during evaluation: PACU Anesthesia Type: General Level of consciousness: awake and alert, patient cooperative and oriented Pain management: pain level controlled Vital Signs Assessment: post-procedure vital signs reviewed and stable Respiratory status: spontaneous breathing, nonlabored ventilation and respiratory function stable Cardiovascular status: blood pressure returned to baseline and stable Postop Assessment: no apparent nausea or vomiting Anesthetic complications: no    Last Vitals:  Vitals:   04/19/19 1400 04/19/19 1415  BP: 122/88 126/82  Pulse: (!) 106 (!) 108  Resp: 14 13  Temp:    SpO2: 99% 99%    Last Pain:  Vitals:   04/19/19 1415  TempSrc:   PainSc: 3                  Laniyah Rosenwald,E. Kadesha Virrueta

## 2019-04-19 NOTE — Op Note (Signed)
Operative Note   DATE OF OPERATION: 9.25.20  LOCATION: [facility]-outpatient  SURGICAL DIVISION: Plastic Surgery  PREOPERATIVE DIAGNOSES:  1. History breast cancer 2. Acquired absence breasts 3. History therapeutic radiation  POSTOPERATIVE DIAGNOSES:  same  PROCEDURE:  1. Removal bilateral chest tissue expanders and placement silicone implants 2. Lipofilling from abdomen to bilateral chest total 130 ml  SURGEON: Irene Limbo MD MBA  ASSISTANT: none  ANESTHESIA:  General.   EBL: 50 ml  COMPLICATIONS: None immediate.   INDICATIONS FOR PROCEDURE:  The patient, Kristi [name], is a 43 y.o. female born on [birth year], is here for staged breast reconstruction following bilateral nipple sparing mastectomies with immediate prepectoral expander acellular dermis reconstruction. She completed adjuvant radiation approximately 6 months ago.   FINDINGS: Complete incorporation ADM noted bilateral. Natrelle Inspira Smooth Round Extra Projection 495 ml implants placed bilateral. REF [id number] RIGHT SN [id number] LEFT SN [id number]. 70 ml fat infiltrated over right chest, 60 ml fat infiltrated over left chest.  DESCRIPTION OF PROCEDURE:  The patient's operative site was marked with the patient in the preoperative area to mark sternal notch, chest midline, anterior axillary lines.Supra and infraumbilical abdomen marked for liposuction.The patientwas taken to the operating room. SCDs were placed and IV antibiotics were given. The patient's operative site was prepped and draped in a sterile fashion. A time out was performed and all information was confirmed to be correct.I began onleftside. Incision made through priorinframammary foldscar and carried through superficial fascia toacellular demis.ADM incised.Expander removed.Capsulotomies performed medially and over lower outer quadrant. Sizer placed.  I then directed attention toright chest andimplant cavityentered in similar  manner.Expander removed and well incorporated ADM noted. Sizer placed and patient brought to upright sitting position. Natrelle Smooth RoundExtra Projection460mlimplant selected forbilateralchest. The patient was returned to supine position.  Stab incision made over bilaterallateral abdomen in prior scars. Tumescent fluid infiltrated over supra and infraumbilical [id number] tumescent infiltrated. Power assisted liposuction performed to endpoint symmetric contour and soft tissue thickness, total lipoaspirate259ml. The fat was then washed and prepared by gravity for infiltration. Harvested fat was then infiltrated in subcutaneous plane throughout mastectomy flaps.  Each cavity irrigated with bacitracin, Ancef, gentamicinsolution.Hemostasis ensured.Each cavity then irrigated with Betadine. The implant was placed inleftchest andimplant orientation ensured. Closure completed with 3-0 vicryl to closesuperficial fascia and ADMover implant.4-0 vicryl used to close dermis followed by 4-0 monocryl subcuticular. Implant placed inrightchest cavity.Closure completedin similar fashion.Abdominal incisions approximated with simple 4-0 monocryl stitch.Steri strips applied to chest incisions. Dry dressing applied, followed by breastbinderand abdominal compression garment  The patient was allowed to wake from anesthesia, extubated and taken to the recovery room in satisfactory condition.   SPECIMENS: none  DRAINS: none  Irene Limbo, MD [facility] Plastic & Reconstructive Surgery [phone], pin [phone]

## 2019-04-19 NOTE — Discharge Instructions (Signed)
No Tylenol until 3:30pm, no ibuprofen until 5:30pm   Post Anesthesia Home Care Instructions  Activity: Get plenty of rest for the remainder of the day. A responsible individual must stay with you for 24 hours following the procedure.  For the next 24 hours, DO NOT: -Drive a car -Paediatric nurse -Drink alcoholic beverages -Take any medication unless instructed by your physician -Make any legal decisions or sign important papers.  Meals: Start with liquid foods such as gelatin or soup. Progress to regular foods as tolerated. Avoid greasy, spicy, heavy foods. If nausea and/or vomiting occur, drink only clear liquids until the nausea and/or vomiting subsides. Call your physician if vomiting continues.  Special Instructions/Symptoms: Your throat may feel dry or sore from the anesthesia or the breathing tube placed in your throat during surgery. If this causes discomfort, gargle with warm salt water. The discomfort should disappear within 24 hours.  If you had a scopolamine patch placed behind your ear for the management of post- operative nausea and/or vomiting:  1. The medication in the patch is effective for 72 hours, after which it should be removed.  Wrap patch in a tissue and discard in the trash. Wash hands thoroughly with soap and water. 2. You may remove the patch earlier than 72 hours if you experience unpleasant side effects which may include dry mouth, dizziness or visual disturbances. 3. Avoid touching the patch. Wash your hands with soap and water after contact with the patch.

## 2019-04-19 NOTE — Anesthesia Procedure Notes (Signed)
Procedure Name: Intubation Date/Time: 04/19/2019 10:57 AM Performed by: Signe Colt, CRNA Pre-anesthesia Checklist: Patient identified, Emergency Drugs available, Suction available and Patient being monitored Patient Re-evaluated:Patient Re-evaluated prior to induction Oxygen Delivery Method: Circle system utilized Preoxygenation: Pre-oxygenation with 100% oxygen Induction Type: IV induction Ventilation: Mask ventilation without difficulty Laryngoscope Size: Mac and 4 Grade View: Grade I Tube type: Oral Tube size: 7.0 mm Number of attempts: 1 Airway Equipment and Method: Stylet and Oral airway Placement Confirmation: ETT inserted through vocal cords under direct vision,  positive ETCO2 and breath sounds checked- equal and bilateral Secured at: 21 cm Tube secured with: Tape Dental Injury: Teeth and Oropharynx as per pre-operative assessment

## 2019-04-19 NOTE — Anesthesia Preprocedure Evaluation (Addendum)
Anesthesia Evaluation  Patient identified by MRN, date of birth, ID band Patient awake    Reviewed: Allergy & Precautions, NPO status , Patient's Chart, lab work & pertinent test results  History of Anesthesia Complications (+) PONV  Airway Mallampati: I  TM Distance: >3 FB Neck ROM: Full    Dental  (+) Dental Advisory Given   Pulmonary neg pulmonary ROS,  04/16/2019 SARS CoV2 NEG   breath sounds clear to auscultation       Cardiovascular (-) anginanegative cardio ROS   Rhythm:Regular Rate:Normal  09/2018 ECHO: EF 55-60%, valves OK   Neuro/Psych  Headaches, Anxiety Depression    GI/Hepatic negative GI ROS, Neg liver ROS,   Endo/Other  negative endocrine ROS  Renal/GU negative Renal ROS     Musculoskeletal   Abdominal   Peds  Hematology negative hematology ROS (+)   Anesthesia Other Findings H/o breast cancer  Reproductive/Obstetrics S/p oophorectomy                            Anesthesia Physical Anesthesia Plan  ASA: II  Anesthesia Plan: General   Post-op Pain Management:    Induction: Intravenous  PONV Risk Score and Plan: 3 and Scopolamine patch - Pre-op, Dexamethasone, Ondansetron, Propofol infusion and TIVA  Airway Management Planned: Oral ETT  Additional Equipment:   Intra-op Plan:   Post-operative Plan: Extubation in OR  Informed Consent: I have reviewed the patients History and Physical, chart, labs and discussed the procedure including the risks, benefits and alternatives for the proposed anesthesia with the patient or authorized representative who has indicated his/her understanding and acceptance.     Dental advisory given  Plan Discussed with: CRNA and Surgeon  Anesthesia Plan Comments:        Anesthesia Quick Evaluation

## 2019-04-22 ENCOUNTER — Encounter (HOSPITAL_BASED_OUTPATIENT_CLINIC_OR_DEPARTMENT_OTHER): Payer: Self-pay | Admitting: Plastic Surgery

## 2019-04-29 ENCOUNTER — Ambulatory Visit: Payer: Commercial Managed Care - PPO

## 2019-04-29 ENCOUNTER — Other Ambulatory Visit: Payer: Commercial Managed Care - PPO

## 2019-04-30 NOTE — Progress Notes (Addendum)
Patient Care Team: Patient, [name] as PCP - General (General Practice) Nicholas Lose, MD as Consulting Physician (Hematology and Oncology) Rolm Bookbinder, MD as Consulting Physician (General Surgery) Delice Bison, [name], NP as Nurse Practitioner (Hematology and Oncology) Eppie Gibson, MD as Attending Physician (Radiation Oncology)  DIAGNOSIS:    ICD-10-CM   1. Malignant neoplasm of upper-outer quadrant of left breast in female, estrogen receptor positive ([place])  C50.412    Z17.0     SUMMARY OF ONCOLOGIC HISTORY: Oncology History  Malignant neoplasm of upper-outer quadrant of left breast in female, estrogen receptor positive ([place])  01/02/2018 Initial Diagnosis   Palpable lump left breast, mammogram: Left breast UOQ 2.3 cm mass, axilla has several enlarged lymph nodes; 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   01/04/2018 Cancer Staging   Staging form: Breast, AJCC 8th Edition - Clinical: Stage IIB (cT2, cN1, cM0, G3, ER+, PR+, HER2: Equivocal) - Signed by Nicholas Lose, MD on 01/04/2018   01/16/2018 - 06/18/2018 Neo-Adjuvant Chemotherapy   Dose dense Adriamycin and Cytoxan x4 followed by Taxol, switched to Abraxane with cycle 2 after Taxol reaction with palpitations weekly x12   01/19/2018 Genetic Testing   STK11 c.608C>T VUS identified on the Multicancer panel.  The Multi-Gene Panel offered by [facility] includes sequencing and/or deletion duplication testing of the following 83 genes: ALK, APC, ATM, AXIN2,BAP1,  BARD1, BLM, BMPR1A, BRCA1, BRCA2, BRIP1, CASR, CDC73, CDH1, CDK4, CDKN1B, CDKN1C, CDKN2A (p14ARF), CDKN2A (p16INK4a), CEBPA, CHEK2, CTNNA1, DICER1, DIS3L2, EGFR (c.2369C>T, p.Thr790Met variant only), EPCAM (Deletion/duplication testing only), FH, FLCN, GATA2, GPC3, GREM1 (Promoter region deletion/duplication testing only), HOXB13 (c.251G>A, p.Gly84Glu), HRAS, KIT,  MAX, MEN1, MET, MITF (c.952G>A, p.Glu318Lys variant only), MLH1, MSH2, MSH3, MSH6, MUTYH, NBN, NF1, NF2, NTHL1, PALB2, PDGFRA, PHOX2B, PMS2, POLD1, POLE, POT1, PRKAR1A, PTCH1, PTEN, RAD50, RAD51C, RAD51D, RB1, RECQL4, RET, RUNX1, SDHAF2, SDHA (sequence changes only), SDHB, SDHC, SDHD, SMAD4, SMARCA4, SMARCB1, SMARCE1, STK11, SUFU, TERT, TERT, TMEM127, TP53, TSC1, TSC2, VHL, WRN and WT1.  The report date is January 19, 2018.   06/13/2018 Breast MRI   Significant improvement of left breast cancer previously 2.4 x 3.2 x 4 cm, currently 1.4 x 1.3 x 0.8 cm, no lymph nodes   07/02/2018 Surgery   Bilateral mastectomies: Left mastectomy: IDC grade 2, 0.9 cm, margins negative, 1/4 lymph nodes positive with extracapsular extension, margins negative ypT1b ypN1 ER 75% strong, PR 70% strong, HER-2 negative, Ki-67 60%   07/24/2018 Surgery   Left axillary lymph node dissection: 0/4 lymph nodes   09/07/2018 - 10/22/2018 Radiation Therapy   Adjuvant radiation   10/2018 -  Anti-estrogen oral therapy   Zoladex and Anastrozole   02/06/2019 -  Chemotherapy   The patient had [No matching medication found in this treatment plan]  for chemotherapy treatment.      CHIEF COMPLIANT: Follow-up of left breast cancer on [name] clinical trial  INTERVAL HISTORY: Kristi [name] is a 43 y.o. with above-mentioned history of left breast cancer treated with neoadjuvant chemotherapy,bilateral mastectomies, radiation, and who is currently on adjuvant antiestrogen therapy with Zoladex and anastrozole. She is a participant in the [place] clinical trial and was randomized to the ribociclib arm. She underwent reconstruction surgery with Dr. Iran Planas on 04/19/19. She presents to the clinic today for follow-up and to start cycle 4.  Because of continued nausea and vomiting issues inspite of dose reduction, patient wants to come off  the clinical trial.  REVIEW OF SYSTEMS:   Constitutional: Denies fevers, chills or abnormal  weight loss Eyes: Denies blurriness of vision Ears, nose, mouth, throat, and face: Denies mucositis or sore throat Respiratory: Denies cough, dyspnea or wheezes Cardiovascular: Denies palpitation, chest discomfort Gastrointestinal: Nausea and vomiting Skin: Denies abnormal skin rashes Lymphatics: Denies new lymphadenopathy or easy bruising Neurological: Denies numbness, tingling or new weaknesses Behavioral/Psych: Mood is stable, no new changes  Extremities: No lower extremity edema Breast: denies any pain or lumps or nodules in either breasts All other systems were reviewed with the patient and are negative.  I have reviewed the past medical history, past surgical history, social history and family history with the patient and they are unchanged from previous note.  ALLERGIES:  is allergic to sulfa antibiotics; chlorhexidine gluconate; and other.  MEDICATIONS:  Current Outpatient Medications  Medication Sig Dispense Refill  . anastrozole (ARIMIDEX) 1 MG tablet Take 1 tablet (1 mg total) by mouth daily. 90 tablet 3  . baclofen (LIORESAL) 10 MG tablet Take by mouth.     . eletriptan (RELPAX) 40 MG tablet Take 40 mg by mouth every 2 (two) hours as needed for migraine (max 2 doses per 24 hrs.).   3  . EMGALITY 120 MG/ML SOAJ Inject 120 mg into the skin every 30 (thirty) days.     . fexofenadine (ALLEGRA) 180 MG tablet Take 180 mg by mouth daily.     Marland Kitchen gabapentin (NEURONTIN) 300 MG capsule Take 1 capsule (300 mg total) by mouth at bedtime. 90 capsule 3  . Investigational ribociclib (KISQALI) 200 MG tablet [name] Take 1 tablet (200 mg total) by mouth daily. Start 04/03/19. Hold for Surgery 04/12/19 until Cycle 4. . 75 tablet 0  . promethazine (PHENERGAN) 25 MG tablet Take 25 mg by mouth every 6 (six) hours as needed for nausea.    Marland Kitchen venlafaxine XR (EFFEXOR-XR) 75 MG 24 hr capsule Take 75 mg by mouth daily with breakfast.     No current facility-administered medications for this visit.     Facility-Administered Medications Ordered in Other Visits  Medication Dose Route Frequency Provider Last Rate Last Dose  . sodium chloride flush (NS) 0.9 % injection 10 mL  10 mL Intravenous PRN Nicholas Lose, MD   10 mL at 04/13/18 1529    PHYSICAL EXAMINATION: ECOG PERFORMANCE STATUS: 1 - Symptomatic but completely ambulatory  Vitals:   05/01/19 0853  BP: 127/88  Pulse: 96  Resp: 18  Temp: 98.5 F (36.9 C)  SpO2: 100%   Filed Weights   05/01/19 0853  Weight: 145 lb 9.6 oz (66 kg)    GENERAL: alert, no distress and comfortable SKIN: skin color, texture, turgor are normal, no rashes or significant lesions EYES: normal, Conjunctiva are pink and non-injected, sclera clear OROPHARYNX: no exudate, no erythema and lips, buccal mucosa, and tongue normal  NECK: supple, thyroid normal size, non-tender, without nodularity LYMPH: no palpable lymphadenopathy in the cervical, axillary or inguinal LUNGS: clear to auscultation and percussion with normal breathing effort HEART: regular rate & rhythm and no murmurs and no lower extremity edema ABDOMEN: abdomen soft, non-tender and normal bowel sounds MUSCULOSKELETAL: no cyanosis of digits and no clubbing  NEURO: alert & oriented x 3 with fluent speech, no focal motor/sensory deficits EXTREMITIES: No lower extremity edema  LABORATORY DATA:  I have reviewed the data as listed CMP Latest Ref Rng & Units 05/01/2019 04/02/2019 03/18/2019  Glucose 70 - 99 mg/dL 87 88 85  BUN 6 - 20 mg/dL 10 9 12   Creatinine 0.44 - 1.00 mg/dL 0.70 0.78 0.88  Sodium 135 - 145 mmol/L 136 135 137  Potassium 3.5 - 5.1 mmol/L 4.0 3.5 3.7  Chloride 98 - 111 mmol/L 99 101 101  CO2 22 - 32 mmol/L 26 24 26   Calcium 8.9 - 10.3 mg/dL 9.5 9.0 9.5  Total Protein 6.5 - 8.1 g/dL 7.6 7.3 7.8  Total Bilirubin 0.3 - 1.2 mg/dL 0.4 0.5 0.7  Alkaline Phos 38 - 126 U/L 101 95 104  AST 15 - 41 U/L 20 21 23   ALT 0 - 44 U/L 24 17 28     Lab Results  Component Value Date   WBC  5.3 05/01/2019   HGB 13.6 05/01/2019   HCT 40.5 05/01/2019   MCV 92.0 05/01/2019   PLT 294 05/01/2019   NEUTROABS 3.3 05/01/2019    ASSESSMENT & PLAN:  Malignant neoplasm of upper-outer quadrant of left breast in female, estrogen receptor positive ([place]) 01/02/2018: Palpable lump left breastsince December 2018, mammogram: Left breast UOQ 2.3 cm mass, axilla has several enlarged lymph nodes; 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  Status post neoadjuvant chemotherapy with dose dense Adriamycin Cytoxan x4 followed by Abraxane weekly x12  07/02/2018:Bilateral mastectomies: Left mastectomy: IDC grade 2, 0.9 cm, margins negative, 1/4 lymph nodes positive with extracapsular extension, margins negative ypT1b ypN1 ER 75% strong, PR 10% strong, HER-2 negative, Ki-67 60% 07/23/2018: Left axillary lymph node dissection: 0/4 LN Adjuvant radiation therapy 09/07/2018-10/23/2018 ----------------------------------------------------------------------------------------------------------------------------------- [name] clinical trial: Phase 3 clinical trial in patients or estrogen receptor positive randomized between standard antiestrogen therapy versus antiestrogen therapy with ribociclib 400 mg for 36 months. She had bilateral salpingo-oophorectomy on 03/06/2019. Today is cycle 4 day 1: Being discontinued for toxicities   Ribociclib Toxicities: 1.Decreased neutrophil count: Improved with lower dose of Ribociclib  2.Hot flashes: Related to anastrozole therapy.  3.  Nausea grade 2 due to ribociclib.    This persisted in spite of lower dosage fiber cyclic.  After much discussion, patient decided to discontinue the current treatment with ribociclib.  She will make a decision on whether or not she wants to stay on the trial based on what is required. 4.  Numbness of the tongue and metallic taste:  Grade 1  Anastrozole toxicities: Hot flashes much improved after increasing the dosage of Effexor. 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  On gabapentin She had undergone breast reconstruction surgery on 04/19/2019.    Return to clinic  in 4 weeks for follow-up   No orders of the defined types were placed in this encounter.  The patient has a good understanding of the overall plan. she agrees with it. she will call with any problems that may develop before the next visit here.  Nicholas Lose, MD 05/01/2019  Julious Oka Dorshimer am acting as scribe for Dr. Nicholas Lose.  I have reviewed the above documentation for accuracy and completeness, and I agree with the above.   Addendum: No clinical evidence of disease recurrence

## 2019-05-01 ENCOUNTER — Inpatient Hospital Stay: Payer: Commercial Managed Care - PPO | Attending: Hematology and Oncology | Admitting: Hematology and Oncology

## 2019-05-01 ENCOUNTER — Other Ambulatory Visit: Payer: Self-pay

## 2019-05-01 ENCOUNTER — Inpatient Hospital Stay: Payer: Commercial Managed Care - PPO

## 2019-05-01 DIAGNOSIS — T451X5A Adverse effect of antineoplastic and immunosuppressive drugs, initial encounter: Secondary | ICD-10-CM | POA: Insufficient documentation

## 2019-05-01 DIAGNOSIS — Z170 Estrogen receptor positive status [ER+]: Secondary | ICD-10-CM

## 2019-05-01 DIAGNOSIS — N951 Menopausal and female climacteric states: Secondary | ICD-10-CM | POA: Insufficient documentation

## 2019-05-01 DIAGNOSIS — C50412 Malignant neoplasm of upper-outer quadrant of left female breast: Secondary | ICD-10-CM

## 2019-05-01 DIAGNOSIS — Z9221 Personal history of antineoplastic chemotherapy: Secondary | ICD-10-CM | POA: Insufficient documentation

## 2019-05-01 DIAGNOSIS — Z923 Personal history of irradiation: Secondary | ICD-10-CM | POA: Insufficient documentation

## 2019-05-01 DIAGNOSIS — Z79811 Long term (current) use of aromatase inhibitors: Secondary | ICD-10-CM | POA: Insufficient documentation

## 2019-05-01 DIAGNOSIS — Z006 Encounter for examination for normal comparison and control in clinical research program: Secondary | ICD-10-CM | POA: Insufficient documentation

## 2019-05-01 DIAGNOSIS — G620 Drug-induced polyneuropathy: Secondary | ICD-10-CM | POA: Insufficient documentation

## 2019-05-01 DIAGNOSIS — Z9013 Acquired absence of bilateral breasts and nipples: Secondary | ICD-10-CM | POA: Insufficient documentation

## 2019-05-01 LAB — CBC WITH DIFFERENTIAL (CANCER CENTER ONLY)
Abs Immature Granulocytes: 0.02 10*3/uL (ref 0.00–0.07)
Basophils Absolute: 0.1 10*3/uL (ref 0.0–0.1)
Basophils Relative: 2 %
Eosinophils Absolute: 0.4 10*3/uL (ref 0.0–0.5)
Eosinophils Relative: 7 %
HCT: 40.5 % (ref 36.0–46.0)
Hemoglobin: 13.6 g/dL (ref 12.0–15.0)
Immature Granulocytes: 0 %
Lymphocytes Relative: 23 %
Lymphs Abs: 1.2 10*3/uL (ref 0.7–4.0)
MCH: 30.9 pg (ref 26.0–34.0)
MCHC: 33.6 g/dL (ref 30.0–36.0)
MCV: 92 fL (ref 80.0–100.0)
Monocytes Absolute: 0.3 10*3/uL (ref 0.1–1.0)
Monocytes Relative: 6 %
Neutro Abs: 3.3 10*3/uL (ref 1.7–7.7)
Neutrophils Relative %: 62 %
Platelet Count: 294 10*3/uL (ref 150–400)
RBC: 4.4 MIL/uL (ref 3.87–5.11)
RDW: 13.1 % (ref 11.5–15.5)
WBC Count: 5.3 10*3/uL (ref 4.0–10.5)
nRBC: 0 % (ref 0.0–0.2)

## 2019-05-01 LAB — PHOSPHORUS: Phosphorus: 4.2 mg/dL (ref 2.5–4.6)

## 2019-05-01 LAB — CMP (CANCER CENTER ONLY)
ALT: 24 U/L (ref 0–44)
AST: 20 U/L (ref 15–41)
Albumin: 4.3 g/dL (ref 3.5–5.0)
Alkaline Phosphatase: 101 U/L (ref 38–126)
Anion gap: 11 (ref 5–15)
BUN: 10 mg/dL (ref 6–20)
CO2: 26 mmol/L (ref 22–32)
Calcium: 9.5 mg/dL (ref 8.9–10.3)
Chloride: 99 mmol/L (ref 98–111)
Creatinine: 0.7 mg/dL (ref 0.44–1.00)
GFR, Est AFR Am: >60 mL/min (ref >60)
GFR, Estimated: >60 mL/min (ref >60)
Glucose, Bld: 87 mg/dL (ref 70–99)
Potassium: 4 mmol/L (ref 3.5–5.1)
Sodium: 136 mmol/L (ref 135–145)
Total Bilirubin: 0.4 mg/dL (ref 0.3–1.2)
Total Protein: 7.6 g/dL (ref 6.5–8.1)

## 2019-05-01 LAB — BILIRUBIN, DIRECT: Bilirubin, Direct: 0.1 mg/dL (ref 0.0–0.2)

## 2019-05-01 LAB — LIPASE, BLOOD: Lipase: 48 U/L (ref 11–51)

## 2019-05-01 LAB — MAGNESIUM: Magnesium: 2 mg/dL (ref 1.7–2.4)

## 2019-05-01 LAB — RESEARCH LABS

## 2019-05-01 LAB — URIC ACID: Uric Acid, Serum: 4.4 mg/dL (ref 2.5–7.1)

## 2019-05-01 LAB — GAMMA GT: GGT: 26 U/L (ref 7–50)

## 2019-05-01 LAB — AMYLASE: Amylase: 90 U/L (ref 28–100)

## 2019-05-01 LAB — LACTATE DEHYDROGENASE: LDH: 121 U/L (ref 98–192)

## 2019-05-01 NOTE — Assessment & Plan Note (Signed)
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  Status post neoadjuvant chemotherapy with dose dense Adriamycin Cytoxan x4 followed by Abraxane weekly x12  07/02/2018:Bilateral mastectomies: Left mastectomy: IDC grade 2, 0.9 cm, margins negative, 1/4 lymph nodes positive with extracapsular extension, margins negative ypT1b ypN1 ER 75% strong, PR 10% strong, HER-2 negative, Ki-67 60% 07/23/2018: Left axillary lymph node dissection: 0/4 LN Adjuvant radiation therapy 09/07/2018-10/23/2018 ----------------------------------------------------------------------------------------------------------------------------------- [name] clinical trial: Phase 3 clinical trial in patients or estrogen receptor positive randomized between standard antiestrogen therapy versus antiestrogen therapy with ribociclib 400 mg for 36 months.  Today is cycle 4 day 1 This cycle she will only receive short course of ribociclib.  Ribociclib Toxicities: 1.Decreased neutrophil count: Today's ANC is 1.5.  She is having bilateral salpingo-oophorectomy on 03/06/2019.  2.Hot flashes: Related to anastrozole therapy.  3.  Nausea grade 2 due to ribociclib.  We will reduce her ribociclib dosage to 200 mg daily. 4.  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 She plans to undergo breast reconstruction surgery on 04/19/2019. We will have to hold ribociclib in anticipation of that surgery 1 week before and 1 week after.  Return to clinic  in 4 weeks for follow-up and to start cycle 5.

## 2019-05-01 NOTE — Progress Notes (Addendum)
Cycle 4, Day 1 [name] Visit:  Patient into clinic unaccompanied this morning to complete the study activities prior to starting on cycle 4.  Labs: Fasting labs were collected this morning per protocol. Results reviewed by Dr. Lindi Adie and all within normal limits.  Corrected Calcium Formula: 4 g/dL utilized for "normal albumin;" local normal range is 3.5-5 g/dL. Corrected Calcium = (0.8 x [normal albumin - patient's albumin]) + serum calcium Correct Calcium = (0.8 x[4- 4.3]) + 9.5 Corrected Calcium = 9.3  PROs: Patient completed her questionnaires. This RN checked them for completeness.   VS: Completed per protocol.  MD: Patient was seen by Dr. Lindi Adie. Please see his Office Encounter from today.  Con Meds/AEs: Patient said that since Dr. Carlis Abbott increased her dose of Venlafaxine her hot flashes have improved. Patient states that her nausea persisted through the last cycle but stopped when she stopped taking the drug. She takes phenergan for it as needed.    Study Drug: Patient took Ribociclib and ET as directed. Ribociclib has been on hold since Sept. 18th because of patient having surgery on Sept. 25th. Patient returned her bottle and medication diary. Patient reports 100% compliance. Raul Del, pharmacist, counted 66 pills remaining which is the correct amount for the patient only taking the drug for 8 days during Cycle 3. Patient was dose reduced to 200mg  daily due to ongoing grade 2 nausea. Patient reports that this has persisted even with her use of antiemetics. Patient has decided to discontinue taking the Ribociclib.  Plan: Patient is willing to stay in study and continue with study assessments after discontinuing ribociclib. She will return in a month for her Cycle 5 visit. AEs: Event Grade Onset Date End Date Attribution to Goserelin  Attribution to Anastrozole Attributionto Ribociclib Status Comments  Allergic Rhinitis 2 2005  No No No Ongoing; baseline   Headache 1 2000  No  No No Ongoing; basline   Neuropathy hands and feet 1 06/01/18  N/A No No Ongoing; baseline From prior chemo  Hot Flashes 2 12/2017  Yes Yes No Ongoing; baseline   Sore Throat 1 02/27/19 02/28/19 N/A No No Resolved   Nausea 1 03/11/19 03/26/19 N/A No No Increased   Dysgeusia 1 03/13/19  N/A No No Ongoing Metallic taste  Tachycardia 1 03/06/19 04/03/19 N/A No No Resolved Start date updated per new information from GYN notes.  Decreased WBC 1 03/18/19 04/03/19 N/A No Yes Increased Not Clinically Significant  Hypertension 1 03/20/19 04/03/19 N/A No No Resolved   Mouth Sore 1 03/21/19  N/A No No Ongoing   Nausea 2 03/26/19 04/12/19 N/A No Yes Ongoing Decreased Ribociclib Dose  Decreased WBC 2 04/02/19 05/01/2019 N/A No Yes Ongoing Not Clinically Significant  Heat Intolerance 1 03/27/19 04/12/19 N/A Yes No Ongoing   Decreased Neutrophils 1 04/02/19 05/01/19 N/A No Yes Ongoing   Hypertension 2 05/01/19  N/A No No New    Johney Maine RN, BSN Clinical Research  05/01/19 1:38 PM

## 2019-05-02 ENCOUNTER — Telehealth: Payer: Self-pay | Admitting: Hematology and Oncology

## 2019-05-02 NOTE — Telephone Encounter (Signed)
Pt already scheduled per 10/7 los.  No additional appts added.

## 2019-05-06 ENCOUNTER — Other Ambulatory Visit: Payer: Self-pay | Admitting: Hematology and Oncology

## 2019-05-06 DIAGNOSIS — R232 Flushing: Secondary | ICD-10-CM

## 2019-05-24 ENCOUNTER — Telehealth: Payer: Self-pay | Admitting: *Deleted

## 2019-05-24 NOTE — Telephone Encounter (Signed)
Kristi [name];  30 day Post Ribociclib Safety Follow-up.  Called patient for above time point on [name].  Last dose of Ribociclib was taken on 04/11/19.  Concomitant Medications; Patient denies any changes to medications taken since last [name] visit 05/01/19.  Adverse Events; Patient denies any new or worsening symptoms since last [name] visit 05/01/19 and has not needed any medical care since that visit.  Subsequent antineoplastic therapies: Patient has not started any new cancer treatments.   Thanked patient for her time today and confirmed her appointments for next week's Cycle 5 [name] visit scheduled for 05/29/19.  Informed patient of new consent form and asked if she can come in early to review changes and sign.  Briefly reviewed changes to consent over the phone and will review further in person. Patient denied any questions.  Reminded patient to fast for at least 8 hours prior to lab appointment. Reminded patient to bring in her medication diary and her anastrozole to take in clinic that day.  Patient verbalized understanding.  [name], BSN, RN Clinical Research Nurse 05/24/2019 4:10 PM

## 2019-05-27 ENCOUNTER — Ambulatory Visit: Payer: Commercial Managed Care - PPO

## 2019-05-28 ENCOUNTER — Telehealth: Payer: Self-pay | Admitting: *Deleted

## 2019-05-28 DIAGNOSIS — C50412 Malignant neoplasm of upper-outer quadrant of left female breast: Secondary | ICD-10-CM

## 2019-05-28 DIAGNOSIS — Z170 Estrogen receptor positive status [ER+]: Secondary | ICD-10-CM

## 2019-05-28 NOTE — Telephone Encounter (Signed)
Kristi [name]; Reminded patient to fast for at least 8 hrs before her lab appointment tomorrow morning, to not take Anastrozole at home,bring Anastrozole to take in clinic tomorrow and to bring her medication diary. Also reminded patient to come in early to sign new version of consent form prior to lab appointment.  Patient verbalized understanding.  [name], BSN, RN Clinical Research Nurse 05/28/2019 12:02 PM

## 2019-05-28 NOTE — Progress Notes (Signed)
Patient Care Team: Patient, [name] as PCP - General (General Practice) Nicholas Lose, MD as Consulting Physician (Hematology and Oncology) Rolm Bookbinder, MD as Consulting Physician (General Surgery) Delice Bison, [name], NP as Nurse Practitioner (Hematology and Oncology) Eppie Gibson, MD as Attending Physician (Radiation Oncology)  DIAGNOSIS:    ICD-10-CM   1. Malignant neoplasm of upper-outer quadrant of left breast in female, estrogen receptor positive ([place])  C50.412    Z17.0     SUMMARY OF ONCOLOGIC HISTORY: Oncology History  Malignant neoplasm of upper-outer quadrant of left breast in female, estrogen receptor positive ([place])  01/02/2018 Initial Diagnosis   Palpable lump left breast, mammogram: Left breast UOQ 2.3 cm mass, axilla has several enlarged lymph nodes; 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   01/04/2018 Cancer Staging   Staging form: Breast, AJCC 8th Edition - Clinical: Stage IIB (cT2, cN1, cM0, G3, ER+, PR+, HER2: Equivocal) - Signed by Nicholas Lose, MD on 01/04/2018   01/16/2018 - 06/18/2018 Neo-Adjuvant Chemotherapy   Dose dense Adriamycin and Cytoxan x4 followed by Taxol, switched to Abraxane with cycle 2 after Taxol reaction with palpitations weekly x12   01/19/2018 Genetic Testing   STK11 c.608C>T VUS identified on the Multicancer panel.  The Multi-Gene Panel offered by [facility] includes sequencing and/or deletion duplication testing of the following 83 genes: ALK, APC, ATM, AXIN2,BAP1,  BARD1, BLM, BMPR1A, BRCA1, BRCA2, BRIP1, CASR, CDC73, CDH1, CDK4, CDKN1B, CDKN1C, CDKN2A (p14ARF), CDKN2A (p16INK4a), CEBPA, CHEK2, CTNNA1, DICER1, DIS3L2, EGFR (c.2369C>T, p.Thr790Met variant only), EPCAM (Deletion/duplication testing only), FH, FLCN, GATA2, GPC3, GREM1 (Promoter region deletion/duplication testing only), HOXB13 (c.251G>A, p.Gly84Glu), HRAS, KIT,  MAX, MEN1, MET, MITF (c.952G>A, p.Glu318Lys variant only), MLH1, MSH2, MSH3, MSH6, MUTYH, NBN, NF1, NF2, NTHL1, PALB2, PDGFRA, PHOX2B, PMS2, POLD1, POLE, POT1, PRKAR1A, PTCH1, PTEN, RAD50, RAD51C, RAD51D, RB1, RECQL4, RET, RUNX1, SDHAF2, SDHA (sequence changes only), SDHB, SDHC, SDHD, SMAD4, SMARCA4, SMARCB1, SMARCE1, STK11, SUFU, TERT, TERT, TMEM127, TP53, TSC1, TSC2, VHL, WRN and WT1.  The report date is January 19, 2018.   06/13/2018 Breast MRI   Significant improvement of left breast cancer previously 2.4 x 3.2 x 4 cm, currently 1.4 x 1.3 x 0.8 cm, no lymph nodes   07/02/2018 Surgery   Bilateral mastectomies: Left mastectomy: IDC grade 2, 0.9 cm, margins negative, 1/4 lymph nodes positive with extracapsular extension, margins negative ypT1b ypN1 ER 75% strong, PR 70% strong, HER-2 negative, Ki-67 60%   07/24/2018 Surgery   Left axillary lymph node dissection: 0/4 lymph nodes   09/07/2018 - 10/22/2018 Radiation Therapy   Adjuvant radiation   10/2018 -  Anti-estrogen oral therapy   Zoladex and Anastrozole   02/06/2019 -  Chemotherapy   The patient had [No matching medication found in this treatment plan]  for chemotherapy treatment.      CHIEF COMPLIANT: Follow-up of left breast cancer on [name] clinical trial  INTERVAL HISTORY: Kristi [name] is a 43 y.o. with above-mentioned history of left breast cancer treated with neoadjuvant chemotherapy,bilateral mastectomies, radiation, and who is currently on adjuvant antiestrogen therapy with Zoladex and anastrozole. She is a participant in the [facility] clinical trial and was randomized to the ribociclib arm. Ribociclib was discontinued on 05/01/19 due to nausea and tongue numbness. She presents to the clinic today for follow-up.  Nausea has finally subsided a week ago.  Hot flashes have been up and down.  She is currently  on Effexor 75 mg daily.  No further issues with her metallic taste in the mouth.  No mouth sores.  Denies any muscle aches  or pains.  She is trying to exercise regularly.  REVIEW OF SYSTEMS:   Constitutional: Denies fevers, chills or abnormal weight loss Eyes: Denies blurriness of vision Ears, nose, mouth, throat, and face: Denies mucositis or sore throat Respiratory: Denies cough, dyspnea or wheezes Cardiovascular: Denies palpitation, chest discomfort Gastrointestinal: Denies nausea, heartburn or change in bowel habits Skin: Denies abnormal skin rashes Lymphatics: Denies new lymphadenopathy or easy bruising Neurological: Denies numbness, tingling or new weaknesses Behavioral/Psych: Mood is stable, no new changes  Extremities: No lower extremity edema Breast: denies any pain or lumps or nodules in either breasts All other systems were reviewed with the patient and are negative.  I have reviewed the past medical history, past surgical history, social history and family history with the patient and they are unchanged from previous note.  ALLERGIES:  is allergic to sulfa antibiotics; chlorhexidine gluconate; and other.  MEDICATIONS:  Current Outpatient Medications  Medication Sig Dispense Refill   anastrozole (ARIMIDEX) 1 MG tablet Take 1 tablet (1 mg total) by mouth daily. 90 tablet 3   baclofen (LIORESAL) 10 MG tablet Take by mouth.      eletriptan (RELPAX) 40 MG tablet Take 40 mg by mouth every 2 (two) hours as needed for migraine (max 2 doses per 24 hrs.).   3   EMGALITY 120 MG/ML SOAJ Inject 120 mg into the skin every 30 (thirty) days.      fexofenadine (ALLEGRA) 180 MG tablet Take 180 mg by mouth daily.      gabapentin (NEURONTIN) 300 MG capsule TAKE ONE CAPSULE BY MOUTH AT BEDTIME 30 capsule 2   promethazine (PHENERGAN) 25 MG tablet Take 25 mg by mouth every 6 (six) hours as needed for nausea.     venlafaxine XR (EFFEXOR-XR) 75 MG 24 hr capsule Take 75 mg by mouth daily with breakfast.     No current facility-administered medications for this visit.    Facility-Administered Medications  Ordered in Other Visits  Medication Dose Route Frequency Provider Last Rate Last Dose   sodium chloride flush (NS) 0.9 % injection 10 mL  10 mL Intravenous PRN Nicholas Lose, MD   10 mL at 04/13/18 1529    PHYSICAL EXAMINATION: ECOG PERFORMANCE STATUS: 0 - Asymptomatic  Vitals:   05/29/19 0809  BP: (!) 123/98  Pulse: 87  Resp: 18  Temp: 97.8 F (36.6 C)  SpO2: 100%   Filed Weights   05/29/19 0809  Weight: 145 lb 1.6 oz (65.8 kg)    No evidence of disease recurrence  LABORATORY DATA:  I have reviewed the data as listed CMP Latest Ref Rng & Units 05/01/2019 04/02/2019 03/18/2019  Glucose 70 - 99 mg/dL 87 88 85  BUN 6 - 20 mg/dL 10 9 12   Creatinine 0.44 - 1.00 mg/dL 0.70 0.78 0.88  Sodium 135 - 145 mmol/L 136 135 137  Potassium 3.5 - 5.1 mmol/L 4.0 3.5 3.7  Chloride 98 - 111 mmol/L 99 101 101  CO2 22 - 32 mmol/L 26 24 26   Calcium 8.9 - 10.3 mg/dL 9.5 9.0 9.5  Total Protein 6.5 - 8.1 g/dL 7.6 7.3 7.8  Total Bilirubin 0.3 - 1.2 mg/dL 0.4 0.5 0.7  Alkaline Phos 38 - 126 U/L 101 95 104  AST 15 - 41 U/L 20 21 23   ALT 0 - 44 U/L 24 17 28  Lab Results  Component Value Date   WBC 4.2 05/29/2019   HGB 14.0 05/29/2019   HCT 42.3 05/29/2019   MCV 92.4 05/29/2019   PLT 280 05/29/2019   NEUTROABS 2.5 05/29/2019    ASSESSMENT & PLAN:  Malignant neoplasm of upper-outer quadrant of left breast in female, estrogen receptor positive ([place]) positive ([place]) 01/02/2018: Palpable lump left breastsince December 2018, mammogram: Left breast UOQ 2.3 cm mass, axilla has several enlarged lymph nodes; 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  Status post neoadjuvant chemotherapy with dose dense Adriamycin Cytoxan x4 followed by Abraxane weekly x12  07/02/2018:Bilateral mastectomies: Left mastectomy: IDC grade 2, 0.9 cm, margins negative, 1/4 lymph nodes positive with extracapsular  extension, margins negative ypT1b ypN1 ER 75% strong, PR 10% strong, HER-2 negative, Ki-67 60% 07/23/2018: Left axillary lymph node dissection: 0/4 LN Adjuvant radiation therapy 09/07/2018-10/23/2018 ----------------------------------------------------------------------------------------------------------------------------------- [name] clinical trial: Phase 3 clinical trial in patients or estrogen receptor positive randomized between standard antiestrogen therapy versus antiestrogen therapy with ribociclib 400 mg for 36 months. Discontinued after 3 cycles because of toxicities (nausea, metallic taste, numbness of the tongue) Adverse effects to ribociclib have resolved.  She had bilateral salpingo-oophorectomy on 03/06/2019. Breast reconstruction surgery 04/19/2019  Current treatment: Anastrozole Anastrozole toxicities: Hot flashes much improved after Effexor currently at 75 mg daily.  Hot flashes have been up and down. Chemo-induced peripheral neuropathy: Resolved  Return to clinic in 1 months for follow-up and after that we can see her in 3 months    No orders of the defined types were placed in this encounter.  The patient has a good understanding of the overall plan. she agrees with it. she will call with any problems that may develop before the next visit here.  Nicholas Lose, MD 05/29/2019  Julious Oka Dorshimer am acting as scribe for Dr. Nicholas Lose.  I have reviewed the above documentation for accuracy and completeness, and I agree with the above.

## 2019-05-29 ENCOUNTER — Other Ambulatory Visit: Payer: Self-pay

## 2019-05-29 ENCOUNTER — Inpatient Hospital Stay: Payer: Commercial Managed Care - PPO

## 2019-05-29 ENCOUNTER — Inpatient Hospital Stay: Payer: Commercial Managed Care - PPO | Attending: Hematology and Oncology | Admitting: Hematology and Oncology

## 2019-05-29 ENCOUNTER — Encounter: Payer: Self-pay | Admitting: *Deleted

## 2019-05-29 DIAGNOSIS — Z170 Estrogen receptor positive status [ER+]: Secondary | ICD-10-CM

## 2019-05-29 DIAGNOSIS — C50412 Malignant neoplasm of upper-outer quadrant of left female breast: Secondary | ICD-10-CM

## 2019-05-29 DIAGNOSIS — Z006 Encounter for examination for normal comparison and control in clinical research program: Secondary | ICD-10-CM

## 2019-05-29 LAB — URIC ACID: Uric Acid, Serum: 4.6 mg/dL (ref 2.5–7.1)

## 2019-05-29 LAB — CMP (CANCER CENTER ONLY)
ALT: 26 U/L (ref 0–44)
AST: 21 U/L (ref 15–41)
Albumin: 4.6 g/dL (ref 3.5–5.0)
Alkaline Phosphatase: 96 U/L (ref 38–126)
Anion gap: 11 (ref 5–15)
BUN: 12 mg/dL (ref 6–20)
CO2: 26 mmol/L (ref 22–32)
Calcium: 9.3 mg/dL (ref 8.9–10.3)
Chloride: 98 mmol/L (ref 98–111)
Creatinine: 0.8 mg/dL (ref 0.44–1.00)
GFR, Est AFR Am: >60 mL/min (ref >60)
GFR, Estimated: >60 mL/min (ref >60)
Glucose, Bld: 88 mg/dL (ref 70–99)
Potassium: 3.7 mmol/L (ref 3.5–5.1)
Sodium: 135 mmol/L (ref 135–145)
Total Bilirubin: 0.3 mg/dL (ref 0.3–1.2)
Total Protein: 7.7 g/dL (ref 6.5–8.1)

## 2019-05-29 LAB — CBC WITH DIFFERENTIAL (CANCER CENTER ONLY)
Abs Immature Granulocytes: 0.01 10*3/uL (ref 0.00–0.07)
Basophils Absolute: 0 10*3/uL (ref 0.0–0.1)
Basophils Relative: 1 %
Eosinophils Absolute: 0.3 10*3/uL (ref 0.0–0.5)
Eosinophils Relative: 6 %
HCT: 42.3 % (ref 36.0–46.0)
Hemoglobin: 14 g/dL (ref 12.0–15.0)
Immature Granulocytes: 0 %
Lymphocytes Relative: 28 %
Lymphs Abs: 1.2 10*3/uL (ref 0.7–4.0)
MCH: 30.6 pg (ref 26.0–34.0)
MCHC: 33.1 g/dL (ref 30.0–36.0)
MCV: 92.4 fL (ref 80.0–100.0)
Monocytes Absolute: 0.2 10*3/uL (ref 0.1–1.0)
Monocytes Relative: 5 %
Neutro Abs: 2.5 10*3/uL (ref 1.7–7.7)
Neutrophils Relative %: 60 %
Platelet Count: 280 10*3/uL (ref 150–400)
RBC: 4.58 MIL/uL (ref 3.87–5.11)
RDW: 11.9 % (ref 11.5–15.5)
WBC Count: 4.2 10*3/uL (ref 4.0–10.5)
nRBC: 0 % (ref 0.0–0.2)

## 2019-05-29 LAB — MAGNESIUM: Magnesium: 2 mg/dL (ref 1.7–2.4)

## 2019-05-29 LAB — BILIRUBIN, DIRECT: Bilirubin, Direct: 0.1 mg/dL (ref 0.0–0.2)

## 2019-05-29 LAB — LACTATE DEHYDROGENASE: LDH: 115 U/L (ref 98–192)

## 2019-05-29 LAB — PHOSPHORUS: Phosphorus: 3.8 mg/dL (ref 2.5–4.6)

## 2019-05-29 LAB — AMYLASE: Amylase: 93 U/L (ref 28–100)

## 2019-05-29 LAB — LIPASE, BLOOD: Lipase: 55 U/L — ABNORMAL HIGH (ref 11–51)

## 2019-05-29 LAB — GAMMA GT: GGT: 19 U/L (ref 7–50)

## 2019-05-29 NOTE — Research (Signed)
Cycle 5, Day 1 [name] Visit:  Patient into clinic unaccompanied this morning to complete the study activities prior to starting on cycle 5.  RE-CONSENT PV 4.1:  Reviewed new consent form for Protocol Version 4.1 with patient prior to study assessments. Spent 15 minutes reviewing consent form and paying particular attention to the changes made which were outlined on page 3 of the consent. Patient verbalized understanding of changes and denied any questions about updated version. Patient agreed to continue on study and agreed to continue to allow optional pharmacogenetic sample and additional research using personal data and biological samples. Patient signed and dated all 3 sections. This RN signed and dated each section as did Dr. Lindi Adie. Patient declined to take a copy of the signed consent form stating she has too much paper.  Informed patient RN is obliged to give it to her and she agreed to get it on next visit as she did not want to wait for copies to be made after her visit today.  Labs: Fasting labs were collected this morning per protocol. Patient confirmed she has been fasting at least 8 hours. Results reviewed by Dr. Lindi Adie and all within normal limits except for the Lipase which Dr. Lindi Adie states is not clinically significant. Corrected Calcium Formula:  4 g/dL utilized for "normal albumin;" local normal range is 3.5-5 g/dL. Corrected Calcium = (0.8 x [normal albumin - patient's albumin]) + serum calcium Correct Calcium = (0.8 x [4-4.6]) + 9.0 Corrected Calcium = 8.52 VS: Completed per protocol.See VS flow sheet. MD: Patient was seen by Dr. Lindi Adie. Please see his Office Encounter from today. Concomitant Meds: Patient reports she is alternating the levocetirizine with fexofenadine for allergy symptoms. She stopped taking promethazine since nausea resolved. Medication list updated. AEs: Patient reports the following changes in symptoms since last visit; Dysgeusia- Resolved a few days after  stopping Ribociclib.  Mouth Sore- Resolved a few days after dose decrease of Ribociclib. Nausea- This was reported as resolved 04/12/19 on AE table from 05/01/19 visit but patient states today that nausea has just recently resolved one week ago. AE table updated. Neuropathy- Resolved a few weeks ago. This was present at baseline.  Hypertension-Correction to Hypertension in AE table 05/01/19 was graded as a "2". Hypertension on 05/01/19 was actually grade 1, not grade 2 as documented. However, it is now increased to grade 2 on today's visit.  Endocrine Therapy: Patient recorded her Anastrozole for cycle 4 on medication diary that was emailed to patient by research RN Johney Maine. She brought this into clinic with her today.  Gave patient a study provided/approved medication diary and asked patient to transcribe recorded intake from emailed copy onto study provided medication diary. Patient completed this without any difficulty. Both copies of diaries collected. Patient reports 100% compliance with ET intake and diary reflects this. New medication diary for cycle 5 given to patient and reminded on completing daily and returning both pages of diary on her next visit. Patient verbalized understanding. She took Anastrozole in clinic after all study assessments were completed and documented in medication diary.  Plan: Patient will return in a month for her Cycle 6 visit 06/26/19. Thanked patient for her ongoing participation in this study and encouraged her to call for any new questions or concerns prior to next visit. She verbalized understanding.  AEs: Event Grade Onset Date End Date Attribution to Anastrozole Attributionto Ribociclib Status Comments  Allergic Rhinitis 2 2005  No No Ongoing; baseline   Headache 1 2000  No No Ongoing; basline   Neuropathy hands and feet 1 06/01/18 05/15/19 No No Resolved At baseline, now resolved.  Hot Flashes 2 12/2017  Yes No Ongoing; baseline Intermittent  Dysgeusia  1 03/13/19 04/13/19 No No Resolved Metallic taste  Mouth Sore 1 03/21/19 04/05/19 No No Resolved   Hypertension 1 05/01/19 05/29/19 No No Grade increased   Hypertension 2 05/29/19  No No Ongoing   Nausea 2 03/26/19 05/22/19 No Yes Resolved    [name], BSN, RN Clinical Research Nurse 05/29/2019 10:16 AM

## 2019-05-29 NOTE — Assessment & Plan Note (Signed)
positive ([place]) 01/02/2018: Palpable lump left breastsince December 2018, mammogram: Left breast UOQ 2.3 cm mass, axilla has several enlarged lymph nodes; 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  Status post neoadjuvant chemotherapy with dose dense Adriamycin Cytoxan x4 followed by Abraxane weekly x12  07/02/2018:Bilateral mastectomies: Left mastectomy: IDC grade 2, 0.9 cm, margins negative, 1/4 lymph nodes positive with extracapsular extension, margins negative ypT1b ypN1 ER 75% strong, PR 10% strong, HER-2 negative, Ki-67 60% 07/23/2018: Left axillary lymph node dissection: 0/4 LN Adjuvant radiation therapy 09/07/2018-10/23/2018 ----------------------------------------------------------------------------------------------------------------------------------- [name] clinical trial: Phase 3 clinical trial in patients or estrogen receptor positive randomized between standard antiestrogen therapy versus antiestrogen therapy with ribociclib 400 mg for 36 months. Discontinued after 3 cycles because of toxicities (nausea, metallic taste, numbness of the tongue) She had bilateral salpingo-oophorectomy on 03/06/2019. 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

## 2019-05-30 ENCOUNTER — Telehealth: Payer: Self-pay | Admitting: Hematology and Oncology

## 2019-06-24 ENCOUNTER — Other Ambulatory Visit: Payer: Self-pay | Admitting: *Deleted

## 2019-06-24 ENCOUNTER — Ambulatory Visit: Payer: Commercial Managed Care - PPO

## 2019-06-24 DIAGNOSIS — C50412 Malignant neoplasm of upper-outer quadrant of left female breast: Secondary | ICD-10-CM

## 2019-06-24 DIAGNOSIS — Z170 Estrogen receptor positive status [ER+]: Secondary | ICD-10-CM

## 2019-06-25 ENCOUNTER — Telehealth: Payer: Self-pay | Admitting: *Deleted

## 2019-06-25 NOTE — Telephone Encounter (Signed)
[name]; LVM for patient reminding her to fast for at least 8 hrs before her lab appointment tomorrow morning, to not take Anastrozole at home,bring Anastrozole to take in clinic tomorrow and to bring her medication diary.  Informed patient there is one study ECG for tomorrow's visit.  Asked patient to please call back if any questions or if she cannot make this appointment for any reason.   [name], BSN, RN Clinical Research Nurse 06/25/2019 9:14 AM

## 2019-06-25 NOTE — Progress Notes (Signed)
Patient Care Team: Patient, [name] as PCP - General (General Practice) Nicholas Lose, MD as Consulting Physician (Hematology and Oncology) Rolm Bookbinder, MD as Consulting Physician (General Surgery) Delice Bison, [name], NP as Nurse Practitioner (Hematology and Oncology) Eppie Gibson, MD as Attending Physician (Radiation Oncology)  DIAGNOSIS:    ICD-10-CM   1. Tachycardia  R00.0 Thyroid Panel With TSH    Hemoglobin A1c  2. Malignant neoplasm of upper-outer quadrant of left breast in female, estrogen receptor positive (HCC)  C50.412 Thyroid Panel With TSH   Z17.0 Hemoglobin A1c  3. Healthcare maintenance  Z00.00 Thyroid Panel With TSH    Hemoglobin A1c    SUMMARY OF ONCOLOGIC HISTORY: Oncology History  Malignant neoplasm of upper-outer quadrant of left breast in female, estrogen receptor positive ([place])  01/02/2018 Initial Diagnosis   Palpable lump left breast, mammogram: Left breast UOQ 2.3 cm mass, axilla has several enlarged lymph nodes; 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   01/04/2018 Cancer Staging   Staging form: Breast, AJCC 8th Edition - Clinical: Stage IIB (cT2, cN1, cM0, G3, ER+, PR+, HER2: Equivocal) - Signed by Nicholas Lose, MD on 01/04/2018   01/16/2018 - 06/18/2018 Neo-Adjuvant Chemotherapy   Dose dense Adriamycin and Cytoxan x4 followed by Taxol, switched to Abraxane with cycle 2 after Taxol reaction with palpitations weekly x12   01/19/2018 Genetic Testing   STK11 c.608C>T VUS identified on the Multicancer panel.  The Multi-Gene Panel offered by [facility] includes sequencing and/or deletion duplication testing of the following 83 genes: ALK, APC, ATM, AXIN2,BAP1,  BARD1, BLM, BMPR1A, BRCA1, BRCA2, BRIP1, CASR, CDC73, CDH1, CDK4, CDKN1B, CDKN1C, CDKN2A (p14ARF), CDKN2A (p16INK4a), CEBPA, CHEK2, CTNNA1, DICER1, DIS3L2, EGFR (c.2369C>T, p.Thr790Met  variant only), EPCAM (Deletion/duplication testing only), FH, FLCN, GATA2, GPC3, GREM1 (Promoter region deletion/duplication testing only), HOXB13 (c.251G>A, p.Gly84Glu), HRAS, KIT, MAX, MEN1, MET, MITF (c.952G>A, p.Glu318Lys variant only), MLH1, MSH2, MSH3, MSH6, MUTYH, NBN, NF1, NF2, NTHL1, PALB2, PDGFRA, PHOX2B, PMS2, POLD1, POLE, POT1, PRKAR1A, PTCH1, PTEN, RAD50, RAD51C, RAD51D, RB1, RECQL4, RET, RUNX1, SDHAF2, SDHA (sequence changes only), SDHB, SDHC, SDHD, SMAD4, SMARCA4, SMARCB1, SMARCE1, STK11, SUFU, TERT, TERT, TMEM127, TP53, TSC1, TSC2, VHL, WRN and WT1.  The report date is January 19, 2018.   06/13/2018 Breast MRI   Significant improvement of left breast cancer previously 2.4 x 3.2 x 4 cm, currently 1.4 x 1.3 x 0.8 cm, no lymph nodes   07/02/2018 Surgery   Bilateral mastectomies: Left mastectomy: IDC grade 2, 0.9 cm, margins negative, 1/4 lymph nodes positive with extracapsular extension, margins negative ypT1b ypN1 ER 75% strong, PR 70% strong, HER-2 negative, Ki-67 60%   07/24/2018 Surgery   Left axillary lymph node dissection: 0/4 lymph nodes   09/07/2018 - 10/22/2018 Radiation Therapy   Adjuvant radiation   10/2018 -  Anti-estrogen oral therapy   Zoladex and Anastrozole   02/06/2019 - 04/02/2019 Chemotherapy   The patient had [No matching medication found in this treatment plan]  for chemotherapy treatment.      CHIEF COMPLIANT: Follow-up of left breast cancer on [name] clinical trial  INTERVAL HISTORY: Kristi [name] is a 43 y.o. with above-mentioned history of breast cancer treated with neoadjuvant chemotherapy,bilateral mastectomies, radiation, and who is currently on adjuvant antiestrogen therapy with Zoladex and anastrozole. She is a participant in the [place] clinical trial and was randomized to the ribociclib arm. Ribociclib was discontinued on 05/01/19 due to  nausea and tongue numbness. She presents to the clinic today for follow-up.  She had a recent dental  evaluation which showed changes of carious teeth.  She also had gum inflammation. She has noted intermittent tachycardia associate with palpitations.  REVIEW OF SYSTEMS:   Constitutional: Denies fevers, chills or abnormal weight loss Eyes: Denies blurriness of vision Ears, nose, mouth, throat, and face: Denies mucositis or sore throat Respiratory: Denies cough, dyspnea or wheezes Cardiovascular: Occasional palpitations Gastrointestinal: Denies nausea, heartburn or change in bowel habits Skin: Denies abnormal skin rashes Lymphatics: Denies new lymphadenopathy or easy bruising Neurological: Denies numbness, tingling or new weaknesses Behavioral/Psych: Mood is stable, no new changes  Extremities: No lower extremity edema Breast: denies any pain or lumps or nodules in either breasts All other systems were reviewed with the patient and are negative.  I have reviewed the past medical history, past surgical history, social history and family history with the patient and they are unchanged from previous note.  ALLERGIES:  is allergic to sulfa antibiotics; chlorhexidine gluconate; and other.  MEDICATIONS:  Current Outpatient Medications  Medication Sig Dispense Refill  . anastrozole (ARIMIDEX) 1 MG tablet Take 1 tablet (1 mg total) by mouth daily. 90 tablet 3  . baclofen (LIORESAL) 10 MG tablet Take 10 mg by mouth as needed (Headaches).     . eletriptan (RELPAX) 40 MG tablet Take 40 mg by mouth every 2 (two) hours as needed for migraine (max 2 doses per 24 hrs.).   3  . EMGALITY 120 MG/ML SOAJ Inject 120 mg into the skin every 30 (thirty) days.     . fexofenadine (ALLEGRA) 180 MG tablet Take 180 mg by mouth daily as needed for allergies (Pt alternates with Xyzal).     . gabapentin (NEURONTIN) 300 MG capsule TAKE ONE CAPSULE BY MOUTH AT BEDTIME 30 capsule 2  . levocetirizine (XYZAL) 5 MG tablet Take 5 mg by mouth daily as needed for allergies (Pt alternates with Allegra PRN).    Marland Kitchen venlafaxine  XR (EFFEXOR-XR) 75 MG 24 hr capsule Take 75 mg by mouth daily with breakfast.     No current facility-administered medications for this visit.    Facility-Administered Medications Ordered in Other Visits  Medication Dose Route Frequency Provider Last Rate Last Dose  . sodium chloride flush (NS) 0.9 % injection 10 mL  10 mL Intravenous PRN Nicholas Lose, MD   10 mL at 04/13/18 1529    PHYSICAL EXAMINATION: ECOG PERFORMANCE STATUS: 1 - Symptomatic but completely ambulatory  There were no vitals filed for this visit. There were no vitals filed for this visit.  GENERAL: alert, no distress and comfortable SKIN: skin color, texture, turgor are normal, no rashes or significant lesions EYES: normal, Conjunctiva are pink and non-injected, sclera clear OROPHARYNX: no exudate, no erythema and lips, buccal mucosa, and tongue normal  NECK: supple, thyroid normal size, non-tender, without nodularity LYMPH: no palpable lymphadenopathy in the cervical, axillary or inguinal LUNGS: clear to auscultation and percussion with normal breathing effort HEART: regular rate & rhythm and no murmurs and no lower extremity edema ABDOMEN: abdomen soft, non-tender and normal bowel sounds MUSCULOSKELETAL: no cyanosis of digits and no clubbing  NEURO: alert & oriented x 3 with fluent speech, no focal motor/sensory deficits EXTREMITIES: No lower extremity edema  LABORATORY DATA:  I have reviewed the data as listed CMP Latest Ref Rng & Units 06/26/2019 05/29/2019 05/01/2019  Glucose 70 - 99 mg/dL 89 88 87  BUN 6 - 20 mg/dL 11  12 10  Creatinine 0.44 - 1.00 mg/dL 0.78 0.80 0.70  Sodium 135 - 145 mmol/L 138 135 136  Potassium 3.5 - 5.1 mmol/L 4.1 3.7 4.0  Chloride 98 - 111 mmol/L 99 98 99  CO2 22 - 32 mmol/L _0 Calcium 8.9 - 10.3 mg/dL 9.4 9.3 9.5  Total Protein 6.5 - 8.1 g/dL 8.0 7.7 7.6  Total Bilirubin 0.3 - 1.2 mg/dL 0.4 0.3 0.4  Alkaline Phos 38 - 126 U/L 108 96 101  AST 15 - 41 U/L _1 ALT 0 -  44 U/L _2 Lab Results  Component Value Date   WBC 5.4 06/26/2019   HGB 14.5 06/26/2019   HCT 43.8 06/26/2019   MCV 92.0 06/26/2019   PLT 295 06/26/2019   NEUTROABS 3.2 06/26/2019    ASSESSMENT & PLAN:  Malignant neoplasm of upper-outer quadrant of left breast in female, estrogen receptor positive ([place]) 01/02/2018: Palpable lump left breastsince December 2018, mammogram: Left breast UOQ 2.3 cm mass, axilla has several enlarged lymph nodes; 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  Status post neoadjuvant chemotherapy with dose dense Adriamycin Cytoxan x4 followed by Abraxane weekly x12  07/02/2018:Bilateral mastectomies: Left mastectomy: IDC grade 2, 0.9 cm, margins negative, 1/4 lymph nodes positive with extracapsular extension, margins negative ypT1b ypN1 ER 75% strong, PR 10% strong, HER-2 negative, Ki-67 60% 07/23/2018: Left axillary lymph node dissection: 0/4 LN Adjuvant radiation therapy 09/07/2018-10/23/2018 ----------------------------------------------------------------------------------------------------------------------------------- [name] clinical trial: Phase 3 clinical trial in patients or estrogen receptor positive randomized between standard antiestrogen therapy versus antiestrogen therapy with ribociclib 400 mg for 36 months. Discontinued after 3 cycles because of toxicities (nausea, metallic taste, numbness of the tongue) Adverse effects to ribociclib have resolved.  Shehadbilateral salpingo-oophorectomy on 03/06/2019. Breast reconstruction surgery 04/19/2019  Current treatment: Anastrozole Anastrozole toxicities: Hot flashes much improved after Effexor currently at 75 mg daily.  Hot flashes have been up and down. Chemo-induced peripheral neuropathy: Resolved  Palpitations with tachycardia: I will request Dr. Algernon Huxley to evaluate her.  We will check  TSH with her next visit Derm issues with inflammation: We discussed that chemo may have long-term adverse effects on teeth.  Because it could be a finding in diabetes, we will get hemoglobin A1c done with her next lab work.  Return to clinic in 3 months for follow-up    Orders Placed This Encounter  Procedures  . Thyroid Panel With TSH    Standing Status:   Future    Standing Expiration Date:   06/25/2020  . Hemoglobin A1c    Standing Status:   Future    Standing Expiration Date:   06/25/2020   The patient has a good understanding of the overall plan. she agrees with it. she will call with any problems that may develop before the next visit here.  Nicholas Lose, MD 06/26/2019  Julious Oka Dorshimer, am acting as scribe for Dr. Nicholas Lose.  I have reviewed the above documentation for accuracy and completeness, and I agree with the above.

## 2019-06-26 ENCOUNTER — Encounter: Payer: Self-pay | Admitting: *Deleted

## 2019-06-26 ENCOUNTER — Inpatient Hospital Stay (HOSPITAL_BASED_OUTPATIENT_CLINIC_OR_DEPARTMENT_OTHER): Payer: Commercial Managed Care - PPO | Admitting: Hematology and Oncology

## 2019-06-26 ENCOUNTER — Inpatient Hospital Stay: Payer: Commercial Managed Care - PPO | Attending: Hematology and Oncology

## 2019-06-26 ENCOUNTER — Other Ambulatory Visit: Payer: Self-pay

## 2019-06-26 ENCOUNTER — Other Ambulatory Visit: Payer: Commercial Managed Care - PPO

## 2019-06-26 ENCOUNTER — Ambulatory Visit: Payer: Commercial Managed Care - PPO | Admitting: Hematology and Oncology

## 2019-06-26 VITALS — BP 139/94 | HR 87 | Temp 97.9°F | Ht 65.0 in | Wt 145.8 lb

## 2019-06-26 DIAGNOSIS — Z170 Estrogen receptor positive status [ER+]: Secondary | ICD-10-CM

## 2019-06-26 DIAGNOSIS — Z90722 Acquired absence of ovaries, bilateral: Secondary | ICD-10-CM

## 2019-06-26 DIAGNOSIS — C50412 Malignant neoplasm of upper-outer quadrant of left female breast: Secondary | ICD-10-CM

## 2019-06-26 DIAGNOSIS — Z79811 Long term (current) use of aromatase inhibitors: Secondary | ICD-10-CM

## 2019-06-26 DIAGNOSIS — Z006 Encounter for examination for normal comparison and control in clinical research program: Secondary | ICD-10-CM | POA: Insufficient documentation

## 2019-06-26 DIAGNOSIS — Z9221 Personal history of antineoplastic chemotherapy: Secondary | ICD-10-CM

## 2019-06-26 DIAGNOSIS — R000 Tachycardia, unspecified: Secondary | ICD-10-CM

## 2019-06-26 DIAGNOSIS — Z923 Personal history of irradiation: Secondary | ICD-10-CM

## 2019-06-26 DIAGNOSIS — Z0000 Encounter for general adult medical examination without abnormal findings: Secondary | ICD-10-CM

## 2019-06-26 DIAGNOSIS — Z9013 Acquired absence of bilateral breasts and nipples: Secondary | ICD-10-CM

## 2019-06-26 LAB — CMP (CANCER CENTER ONLY)
ALT: 27 U/L (ref 0–44)
AST: 24 U/L (ref 15–41)
Albumin: 4.6 g/dL (ref 3.5–5.0)
Alkaline Phosphatase: 108 U/L (ref 38–126)
Anion gap: 12 (ref 5–15)
BUN: 11 mg/dL (ref 6–20)
CO2: 27 mmol/L (ref 22–32)
Calcium: 9.4 mg/dL (ref 8.9–10.3)
Chloride: 99 mmol/L (ref 98–111)
Creatinine: 0.78 mg/dL (ref 0.44–1.00)
GFR, Est AFR Am: >60 mL/min (ref >60)
GFR, Estimated: >60 mL/min (ref >60)
Glucose, Bld: 89 mg/dL (ref 70–99)
Potassium: 4.1 mmol/L (ref 3.5–5.1)
Sodium: 138 mmol/L (ref 135–145)
Total Bilirubin: 0.4 mg/dL (ref 0.3–1.2)
Total Protein: 8 g/dL (ref 6.5–8.1)

## 2019-06-26 LAB — CBC WITH DIFFERENTIAL (CANCER CENTER ONLY)
Abs Immature Granulocytes: 0.02 10*3/uL (ref 0.00–0.07)
Basophils Absolute: 0 10*3/uL (ref 0.0–0.1)
Basophils Relative: 1 %
Eosinophils Absolute: 0.4 10*3/uL (ref 0.0–0.5)
Eosinophils Relative: 7 %
HCT: 43.8 % (ref 36.0–46.0)
Hemoglobin: 14.5 g/dL (ref 12.0–15.0)
Immature Granulocytes: 0 %
Lymphocytes Relative: 27 %
Lymphs Abs: 1.5 10*3/uL (ref 0.7–4.0)
MCH: 30.5 pg (ref 26.0–34.0)
MCHC: 33.1 g/dL (ref 30.0–36.0)
MCV: 92 fL (ref 80.0–100.0)
Monocytes Absolute: 0.4 10*3/uL (ref 0.1–1.0)
Monocytes Relative: 7 %
Neutro Abs: 3.2 10*3/uL (ref 1.7–7.7)
Neutrophils Relative %: 58 %
Platelet Count: 295 10*3/uL (ref 150–400)
RBC: 4.76 MIL/uL (ref 3.87–5.11)
RDW: 11.9 % (ref 11.5–15.5)
WBC Count: 5.4 10*3/uL (ref 4.0–10.5)
nRBC: 0 % (ref 0.0–0.2)

## 2019-06-26 LAB — LACTATE DEHYDROGENASE: LDH: 141 U/L (ref 98–192)

## 2019-06-26 LAB — URIC ACID: Uric Acid, Serum: 4.6 mg/dL (ref 2.5–7.1)

## 2019-06-26 LAB — GAMMA GT: GGT: 19 U/L (ref 7–50)

## 2019-06-26 LAB — PHOSPHORUS: Phosphorus: 3.9 mg/dL (ref 2.5–4.6)

## 2019-06-26 LAB — AMYLASE: Amylase: 75 U/L (ref 28–100)

## 2019-06-26 LAB — BILIRUBIN, DIRECT: Bilirubin, Direct: <0.1 mg/dL (ref 0.0–0.2)

## 2019-06-26 LAB — MAGNESIUM: Magnesium: 2.1 mg/dL (ref 1.7–2.4)

## 2019-06-26 LAB — LIPASE, BLOOD: Lipase: 43 U/L (ref 11–51)

## 2019-06-26 NOTE — Research (Addendum)
Cycle 6, Day 1 [name] Visit: Patient into clinic unaccompanied this morning to complete the study activities prior to starting on cycle 6. Copy of signed consent form from previous visit given to patient today for her records.  Labs: Fasting labs were collected this morning per protocol. Patient confirmed she has been fasting at least 8 hours. Results reviewed by Dr. Lindi Adie and all within normal limits and reviewed by Dr. Lindi Adie. Corrected Calcium Formula:  4 g/dL utilized for "normal albumin;" local normal range is 3.5-5 g/dL. Corrected Calcium = (0.8 x [normal albumin - patient's albumin]) + serum calcium Correct Calcium = (0.8 x [4-4.6]) + 9.4 Corrected Calcium = 8.92 VS: Completed per protocol.See VS flow sheet. MD: Patient was seen by Dr. Lindi Adie. Please see his Office Encounter from today. Concomitant Meds: Patient reports she has increased Gabapentin from 300 mg to 600 mg at bedtime per Neurologist for migraines. No other changes since last visit.  Medication list updated. Adverse Events: Patient reports the following symptoms since last visit; Intermittent Tachycardia- Grade 1 (06/05/19- ongoing intermittent) Intermittent elevated HR for the past 3 weeks.  Patient has had this previously but it seemed to resolve and now is recurring. Patient states she has "heart monitor" at home and her resting HR has been as high as 135. She feels this mostly at night when trying to go to sleep. She denies any chest pains or SOB but it does cause anxiety.  Dental Issues- Grade 1  Patient reports a discolored tooth she noticed one month ago and gum inflammation on dental exam 06/24/19.  ECG: Completed per protocol without difficulty. QTcF: 403 ms.  Dr. Lindi Adie reviewed, signed and dated.  Endocrine Therapy: Cycle 5 medication diary collected. Patient reports 100% compliance with ET intake and diary reflects this. New medication diary for cycle 6 given to patient and reminded on completing daily and returning  both pages of diary on her next visit. Patient verbalized understanding. She took Anastrozole in clinic after all study assessments were completed and documented in medication diary at 8:30 am. Plan: Patient will return in a month for her Cycle 7 visit 07/24/19. Thanked patient for her ongoing participation in this study and encouraged her to call for any new questions or concerns prior to next visit. She verbalized understanding.  AEs: Event Grade Onset Date End Date Attribution to Anastrozole Attributionto Ribociclib Status Comments  Allergic Rhinitis 2 2005  No No Ongoing; baseline   Headache 1 2000  No No Ongoing; basline   Hot Flashes 2 12/2017  Yes No Ongoing; baseline Intermittent  Hypertension 2 05/29/19  No No Ongoing   Tachycardia 1 06/05/19  No No Ongoing Intermittent  Discolored Tooth 1 04/26/19  No No Ongoing   Gum Inflammation 1 06/24/19  No No Ongoing    [name], BSN, RN Clinical Research Nurse 06/26/2019 11:11 AM

## 2019-06-26 NOTE — Assessment & Plan Note (Signed)
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  Status post neoadjuvant chemotherapy with dose dense Adriamycin Cytoxan x4 followed by Abraxane weekly x12  07/02/2018:Bilateral mastectomies: Left mastectomy: IDC grade 2, 0.9 cm, margins negative, 1/4 lymph nodes positive with extracapsular extension, margins negative ypT1b ypN1 ER 75% strong, PR 10% strong, HER-2 negative, Ki-67 60% 07/23/2018: Left axillary lymph node dissection: 0/4 LN Adjuvant radiation therapy 09/07/2018-10/23/2018 ----------------------------------------------------------------------------------------------------------------------------------- [name] clinical trial: Phase 3 clinical trial in patients or estrogen receptor positive randomized between standard antiestrogen therapy versus antiestrogen therapy with ribociclib 400 mg for 36 months. Discontinued after 3 cycles because of toxicities (nausea, metallic taste, numbness of the tongue) Adverse effects to ribociclib have resolved.  Shehadbilateral salpingo-oophorectomy on 03/06/2019. Breast reconstruction surgery 04/19/2019  Current treatment: Anastrozole Anastrozole toxicities: Hot flashes much improved after Effexor currently at 75 mg daily.  Hot flashes have been up and down. Chemo-induced peripheral neuropathy: Resolved  Return to clinic in 3 months for follow-up

## 2019-07-01 ENCOUNTER — Ambulatory Visit (HOSPITAL_COMMUNITY)
Admission: RE | Admit: 2019-07-01 | Discharge: 2019-07-01 | Disposition: A | Discharge status: Home / Self Care | Payer: Commercial Managed Care - PPO | Source: Ambulatory Visit | Attending: Cardiology | Admitting: Cardiology

## 2019-07-01 ENCOUNTER — Encounter (HOSPITAL_COMMUNITY): Payer: Self-pay | Admitting: Cardiology

## 2019-07-01 ENCOUNTER — Other Ambulatory Visit: Payer: Self-pay

## 2019-07-01 VITALS — BP 128/82 | HR 120 | Wt 143.8 lb

## 2019-07-01 DIAGNOSIS — Z9221 Personal history of antineoplastic chemotherapy: Secondary | ICD-10-CM | POA: Diagnosis not present

## 2019-07-01 DIAGNOSIS — Z9013 Acquired absence of bilateral breasts and nipples: Secondary | ICD-10-CM | POA: Insufficient documentation

## 2019-07-01 DIAGNOSIS — G90A Postural orthostatic tachycardia syndrome (POTS): Secondary | ICD-10-CM

## 2019-07-01 DIAGNOSIS — Z79811 Long term (current) use of aromatase inhibitors: Secondary | ICD-10-CM | POA: Diagnosis not present

## 2019-07-01 DIAGNOSIS — Z90722 Acquired absence of ovaries, bilateral: Secondary | ICD-10-CM | POA: Diagnosis not present

## 2019-07-01 DIAGNOSIS — R002 Palpitations: Secondary | ICD-10-CM | POA: Insufficient documentation

## 2019-07-01 DIAGNOSIS — Z79899 Other long term (current) drug therapy: Secondary | ICD-10-CM | POA: Diagnosis not present

## 2019-07-01 DIAGNOSIS — C50912 Malignant neoplasm of unspecified site of left female breast: Secondary | ICD-10-CM | POA: Diagnosis not present

## 2019-07-01 DIAGNOSIS — R000 Tachycardia, unspecified: Secondary | ICD-10-CM | POA: Diagnosis not present

## 2019-07-01 DIAGNOSIS — Z923 Personal history of irradiation: Secondary | ICD-10-CM | POA: Diagnosis not present

## 2019-07-01 DIAGNOSIS — Z170 Estrogen receptor positive status [ER+]: Secondary | ICD-10-CM | POA: Insufficient documentation

## 2019-07-01 DIAGNOSIS — I498 Other specified cardiac arrhythmias: Secondary | ICD-10-CM

## 2019-07-01 LAB — CBC
HCT: 44.7 % (ref 36.0–46.0)
Hemoglobin: 14.9 g/dL (ref 12.0–15.0)
MCH: 30.4 pg (ref 26.0–34.0)
MCHC: 33.3 g/dL (ref 30.0–36.0)
MCV: 91.2 fL (ref 80.0–100.0)
Platelets: 315 10*3/uL (ref 150–400)
RBC: 4.9 MIL/uL (ref 3.87–5.11)
RDW: 11.8 % (ref 11.5–15.5)
WBC: 6.1 10*3/uL (ref 4.0–10.5)
nRBC: 0 % (ref 0.0–0.2)

## 2019-07-01 LAB — TSH: TSH: 1.611 u[IU]/mL (ref 0.350–4.500)

## 2019-07-01 NOTE — Patient Instructions (Signed)
Your physician has requested that you have an echocardiogram. Echocardiography is a painless test that uses sound waves to create images of your heart. It provides your doctor with information about the size and shape of your heart and how well your heart's chambers and valves are working. This procedure takes approximately one hour. There are no restrictions for this procedure.  Your provider has recommended that  you wear a Zio Patch for 3 days.  This monitor will record your heart rhythm for our review.  IF you have any symptoms while wearing the monitor please press the button.  If you have any issues with the patch or you notice a red or orange light on it please call the company at [phone].  Once you remove the patch please mail it back to the company as soon as possible so we can get the results.  Your physician recommends that you schedule a follow-up appointment in: 6 weeks with Dr Liam Graham today We will only contact you if something comes back abnormal or we need to make some changes. Otherwise no news is good news!  At the [facility], you and your health needs are our priority. As part of our continuing mission to provide you with exceptional heart care, we have created designated Provider Care Teams. These Care Teams include your primary Cardiologist (physician) and Advanced Practice Providers (APPs- Physician Assistants and Nurse Practitioners) who all work together to provide you with the care you need, when you need it.   You may see any of the following providers on your designated Care Team at your next follow up: Marland Kitchen Dr Glori Bickers . Dr Loralie Champagne . Darrick Grinder, NP . Lyda Jester, PA . Audry Riles, PharmD   Please be sure to bring in all your medications bottles to every appointment.

## 2019-07-01 NOTE — Progress Notes (Signed)
Oncology: Dr. Lindi Adie  43 y.o. with history of breast cancer presents was referred by Dr. Lindi Adie for evaluation of dyspnea/chest pain/tachycardia.  She was diagnosed with left breast cancer in 6/19, ER+/PR+/HER2 equivocal.  In 6/19, she started Adriamycin/Cytoxan and has completed 4 cycles.  This was followed by abraxane. She had bilateral mastectomies with axillary node dissection in 12/19.  She complete radiation.  In 8/20, she had BSO.  In 9/20, she had breast reconstruction.     She returns for followup of tachycardia.  Patient has had periods of sinus tachycardia in the past, I have thought that she may have POTS.  She was doing well until about 1-2 months ago.  Her heart rate increased from resting rate in the 90s to resting rate in 110s.  She feels palpitations.  Rare mild lightheadedness with standing.  No syncope, no falls. No exertional dyspnea.  No orthopnea/PND.  No chest pain.  She was not orthostatic when checked today.   ECG (personally reviewed): sinus tachy 114 bpm, otherwise normal.   PMH: 1. Endometriosis.  2. Breast cancer: She was diagnosed with left breast cancer in 6/19, ER+/PR+/HER2 equivocal.  In 6/19, she started Adriamycin/Cytoxan and has completed 4 cycles.  She got paclitaxel for 12 cycles.  She has had bilateral mastectomy, BSO, and breast reconstruction.  She completed radiation.  - Echo (6/19): EF 60-65%.  - Echo (9/19): EF 16-10%, normal diastolic function, normal RV (no change from 6/19).  - Echo (3/20): EF 55-60%, normal RV size and systolic function, GLS -96.0%.  3. Palpitations: Zio patch (9/19) with average HR 94, no significant arrhythmias. Suspected POTS.   FH: Grandfather with DVT.  No premature CAD or CHF.   Social History   Socioeconomic History  . Marital status: Single    Spouse name: Not on file  . Number of children: Not on file  . Years of education: Not on file  . Highest education level: Not on file  Occupational History  . Not on file   Social Needs  . Financial resource strain: Not on file  . Food insecurity    Worry: Not on file    Inability: Not on file  . Transportation needs    Medical: No    Non-medical: No  Tobacco Use  . Smoking status: Never Smoker  . Smokeless tobacco: Never Used  Substance and Sexual Activity  . Alcohol use: Yes    Comment: rare  . Drug use: Never  . Sexual activity: Not on file  Lifestyle  . Physical activity    Days per week: Not on file    Minutes per session: Not on file  . Stress: Not on file  Relationships  . Social Herbalist on phone: Not on file    Gets together: Not on file    Attends religious service: Not on file    Active member of club or organization: Not on file    Attends meetings of clubs or organizations: Not on file    Relationship status: Not on file  . Intimate partner violence    Fear of current or ex partner: No    Emotionally abused: No    Physically abused: No    Forced sexual activity: No  Other Topics Concern  . Not on file  Social History Narrative  . Not on file   ROS: All systems reviewed and negative except as per HPI.   Current Outpatient Medications  Medication Sig Dispense Refill  .  anastrozole (ARIMIDEX) 1 MG tablet Take 1 tablet (1 mg total) by mouth daily. 90 tablet 3  . baclofen (LIORESAL) 10 MG tablet Take 10 mg by mouth as needed (Headaches).     . eletriptan (RELPAX) 40 MG tablet Take 40 mg by mouth every 2 (two) hours as needed for migraine (max 2 doses per 24 hrs.).   3  . EMGALITY 120 MG/ML SOAJ Inject 120 mg into the skin every 30 (thirty) days.     . fexofenadine (ALLEGRA) 180 MG tablet Take 180 mg by mouth daily as needed for allergies (Pt alternates with Xyzal).     . gabapentin (NEURONTIN) 300 MG capsule TAKE ONE CAPSULE BY MOUTH AT BEDTIME 30 capsule 2  . levocetirizine (XYZAL) 5 MG tablet Take 5 mg by mouth daily as needed for allergies (Pt alternates with Allegra PRN).    Marland Kitchen venlafaxine XR (EFFEXOR-XR) 75 MG  24 hr capsule Take 75 mg by mouth daily with breakfast.     No current facility-administered medications for this encounter.    Facility-Administered Medications Ordered in Other Encounters  Medication Dose Route Frequency Provider Last Rate Last Dose  . sodium chloride flush (NS) 0.9 % injection 10 mL  10 mL Intravenous PRN Nicholas Lose, MD   10 mL at 04/13/18 1529   BP 128/82   Pulse (!) 120   Wt 65.2 kg (143 lb 12.8 oz)   SpO2 97%   BMI 23.93 kg/m  General: NAD Neck: No JVD, no thyromegaly or thyroid nodule.  Lungs: Clear to auscultation bilaterally with normal respiratory effort. CV: Nondisplaced PMI.  Heart regular S1/S2, no S3/S4, no murmur.  No peripheral edema.  No carotid bruit.  Normal pedal pulses.  Abdomen: Soft, nontender, no hepatosplenomegaly, no distention.  Skin: Intact without lesions or rashes.  Neurologic: Alert and oriented x 3.  Psych: Normal affect. Extremities: No clubbing or cyanosis.  HEENT: Normal.   Assessment/Plan: 1. Palpitations: Sinus tachycardia with rate in 110s-120s currently.  Mild lightheadedness with standing on occasion.  She is not orthostatic today.  I suspect that she may have POTS. Her HR has waxed and waned in the past.  - I will get echo to reassess EF.  - Zio patch monitor x 3 days.  - Check TSH, CBC.  - I encouraged her to exercise regularly, stay hydrated, and increase dietary sodium.  2. Breast cancer: She has completed Adriamycin-based treatment.  Echo in 3/20 was normal.  - As above, repeat echo.   Followup 6 wks.   Kristi [name] 07/01/2019

## 2019-07-05 ENCOUNTER — Ambulatory Visit: Payer: Commercial Managed Care - PPO | Admitting: Hematology and Oncology

## 2019-07-15 ENCOUNTER — Ambulatory Visit (HOSPITAL_COMMUNITY)
Admission: RE | Admit: 2019-07-15 | Discharge: 2019-07-15 | Disposition: A | Discharge status: Home / Self Care | Payer: Commercial Managed Care - PPO | Source: Ambulatory Visit | Attending: Cardiology | Admitting: Cardiology

## 2019-07-15 ENCOUNTER — Other Ambulatory Visit: Payer: Self-pay

## 2019-07-15 DIAGNOSIS — I509 Heart failure, unspecified: Secondary | ICD-10-CM | POA: Insufficient documentation

## 2019-07-15 DIAGNOSIS — R000 Tachycardia, unspecified: Secondary | ICD-10-CM | POA: Insufficient documentation

## 2019-07-15 NOTE — Progress Notes (Signed)
  Echocardiogram 2D Echocardiogram has been performed.  Kristi [name] 07/15/2019, 3:32 PM

## 2019-07-22 ENCOUNTER — Ambulatory Visit: Payer: Commercial Managed Care - PPO

## 2019-07-22 ENCOUNTER — Ambulatory Visit: Payer: Commercial Managed Care - PPO | Admitting: Hematology and Oncology

## 2019-07-23 ENCOUNTER — Telehealth: Payer: Self-pay | Admitting: *Deleted

## 2019-07-23 NOTE — Progress Notes (Signed)
Patient Care Team: Patient, [name] as PCP - General (General Practice) Nicholas Lose, MD as Consulting Physician (Hematology and Oncology) Rolm Bookbinder, MD as Consulting Physician (General Surgery) Delice Bison, [name], NP as Nurse Practitioner (Hematology and Oncology) Eppie Gibson, MD as Attending Physician (Radiation Oncology)  DIAGNOSIS:    ICD-10-CM   1. Malignant neoplasm of upper-outer quadrant of left breast in female, estrogen receptor positive ([place])  C50.412    Z17.0     SUMMARY OF ONCOLOGIC HISTORY: Oncology History  Malignant neoplasm of upper-outer quadrant of left breast in female, estrogen receptor positive ([place])  01/02/2018 Initial Diagnosis   Palpable lump left breast, mammogram: Left breast UOQ 2.3 cm mass, axilla has several enlarged lymph nodes; 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   01/04/2018 Cancer Staging   Staging form: Breast, AJCC 8th Edition - Clinical: Stage IIB (cT2, cN1, cM0, G3, ER+, PR+, HER2: Equivocal) - Signed by Nicholas Lose, MD on 01/04/2018   01/16/2018 - 06/18/2018 Neo-Adjuvant Chemotherapy   Dose dense Adriamycin and Cytoxan x4 followed by Taxol, switched to Abraxane with cycle 2 after Taxol reaction with palpitations weekly x12   01/19/2018 Genetic Testing   STK11 c.608C>T VUS identified on the Multicancer panel.  The Multi-Gene Panel offered by [facility] includes sequencing and/or deletion duplication testing of the following 83 genes: ALK, APC, ATM, AXIN2,BAP1,  BARD1, BLM, BMPR1A, BRCA1, BRCA2, BRIP1, CASR, CDC73, CDH1, CDK4, CDKN1B, CDKN1C, CDKN2A (p14ARF), CDKN2A (p16INK4a), CEBPA, CHEK2, CTNNA1, DICER1, DIS3L2, EGFR (c.2369C>T, p.Thr790Met variant only), EPCAM (Deletion/duplication testing only), FH, FLCN, GATA2, GPC3, GREM1 (Promoter region deletion/duplication testing only), HOXB13 (c.251G>A, p.Gly84Glu), HRAS, KIT,  MAX, MEN1, MET, MITF (c.952G>A, p.Glu318Lys variant only), MLH1, MSH2, MSH3, MSH6, MUTYH, NBN, NF1, NF2, NTHL1, PALB2, PDGFRA, PHOX2B, PMS2, POLD1, POLE, POT1, PRKAR1A, PTCH1, PTEN, RAD50, RAD51C, RAD51D, RB1, RECQL4, RET, RUNX1, SDHAF2, SDHA (sequence changes only), SDHB, SDHC, SDHD, SMAD4, SMARCA4, SMARCB1, SMARCE1, STK11, SUFU, TERT, TERT, TMEM127, TP53, TSC1, TSC2, VHL, WRN and WT1.  The report date is January 19, 2018.   06/13/2018 Breast MRI   Significant improvement of left breast cancer previously 2.4 x 3.2 x 4 cm, currently 1.4 x 1.3 x 0.8 cm, no lymph nodes   07/02/2018 Surgery   Bilateral mastectomies: Left mastectomy: IDC grade 2, 0.9 cm, margins negative, 1/4 lymph nodes positive with extracapsular extension, margins negative ypT1b ypN1 ER 75% strong, PR 70% strong, HER-2 negative, Ki-67 60%   07/24/2018 Surgery   Left axillary lymph node dissection: 0/4 lymph nodes   09/07/2018 - 10/22/2018 Radiation Therapy   Adjuvant radiation   10/2018 -  Anti-estrogen oral therapy   Zoladex and Anastrozole   02/06/2019 - 04/02/2019 Chemotherapy   The patient had [No matching medication found in this treatment plan]  for chemotherapy treatment.      CHIEF COMPLIANT: Follow-up of left breast cancer on [name] clinical trial  INTERVAL HISTORY: Kristi [name] is a 43 y.o. with above-mentioned history of breast cancer treated with neoadjuvant chemotherapy,bilateral mastectomies, radiation, and who is currently on adjuvant antiestrogen therapy with Zoladex and anastrozole. She is a participant in the [place] clinical trial and was randomized to the ribociclib arm, but ribociclib was discontinued. Echo on 07/15/19 showed an ejection fraction of 60-65%. She presents to the clinic today for follow-up.   REVIEW OF SYSTEMS:   Constitutional: Denies fevers, chills or abnormal weight loss Eyes: Denies blurriness of  vision Ears, nose, mouth, throat, and face: Denies mucositis or sore  throat Respiratory: Denies cough, dyspnea or wheezes Cardiovascular: Denies palpitation, chest discomfort Gastrointestinal: Denies nausea, heartburn or change in bowel habits Skin: Denies abnormal skin rashes Lymphatics: Denies new lymphadenopathy or easy bruising Neurological: Denies numbness, tingling or new weaknesses Behavioral/Psych: Mood is stable, no new changes  Extremities: No lower extremity edema Breast: denies any pain or lumps or nodules in either breasts All other systems were reviewed with the patient and are negative.  I have reviewed the past medical history, past surgical history, social history and family history with the patient and they are unchanged from previous note.  ALLERGIES:  is allergic to sulfa antibiotics; chlorhexidine gluconate; and other.  MEDICATIONS:  Current Outpatient Medications  Medication Sig Dispense Refill  . anastrozole (ARIMIDEX) 1 MG tablet Take 1 tablet (1 mg total) by mouth daily. 90 tablet 3  . baclofen (LIORESAL) 10 MG tablet Take 10 mg by mouth as needed (Headaches).     . eletriptan (RELPAX) 40 MG tablet Take 40 mg by mouth every 2 (two) hours as needed for migraine (max 2 doses per 24 hrs.).   3  . EMGALITY 120 MG/ML SOAJ Inject 120 mg into the skin every 30 (thirty) days.     . fexofenadine (ALLEGRA) 180 MG tablet Take 180 mg by mouth daily as needed for allergies (Pt alternates with Xyzal).     . gabapentin (NEURONTIN) 300 MG capsule TAKE ONE CAPSULE BY MOUTH AT BEDTIME (Patient taking differently: Take 900 mg by mouth at bedtime. ) 30 capsule 2  . levocetirizine (XYZAL) 5 MG tablet Take 5 mg by mouth daily as needed for allergies (Pt alternates with Allegra PRN).    . Rimegepant Sulfate (NURTEC) 75 MG TBDP Take 75 mg by mouth as needed (migraines).    . venlafaxine XR (EFFEXOR-XR) 75 MG 24 hr capsule Take 75 mg by mouth daily with breakfast.     No current facility-administered medications for this visit.   Facility-Administered  Medications Ordered in Other Visits  Medication Dose Route Frequency Provider Last Rate Last Admin  . sodium chloride flush (NS) 0.9 % injection 10 mL  10 mL Intravenous PRN Nicholas Lose, MD   10 mL at 04/13/18 1529    PHYSICAL EXAMINATION: ECOG PERFORMANCE STATUS: 0 - Asymptomatic  Vitals:   07/24/19 0830  BP: 127/88  Pulse: 97  Resp: 17  Temp: 98.2 F (36.8 C)  SpO2: 100%   Filed Weights   07/24/19 0830  Weight: 148 lb 1.6 oz (67.2 kg)    GENERAL: alert, no distress and comfortable SKIN: skin color, texture, turgor are normal, no rashes or significant lesions EYES: normal, Conjunctiva are pink and non-injected, sclera clear OROPHARYNX: no exudate, no erythema and lips, buccal mucosa, and tongue normal  NECK: supple, thyroid normal size, non-tender, without nodularity LYMPH: no palpable lymphadenopathy in the cervical, axillary or inguinal LUNGS: clear to auscultation and percussion with normal breathing effort HEART: regular rate & rhythm and no murmurs and no lower extremity edema ABDOMEN: abdomen soft, non-tender and normal bowel sounds MUSCULOSKELETAL: no cyanosis of digits and no clubbing  NEURO: alert & oriented x 3 with fluent speech, no focal motor/sensory deficits EXTREMITIES: No lower extremity edema  LABORATORY DATA:  I have reviewed the data as listed CMP Latest Ref Rng & Units 07/24/2019 06/26/2019 05/29/2019  Glucose 70 - 99 mg/dL 79 89 88  BUN 6 - 20 mg/dL _0 Creatinine 0.44 -  1.00 mg/dL 0.73 0.78 0.80  Sodium 135 - 145 mmol/L 140 138 135  Potassium 3.5 - 5.1 mmol/L 4.3 4.1 3.7  Chloride 98 - 111 mmol/L 101 99 98  CO2 22 - 32 mmol/L _0 Calcium 8.9 - 10.3 mg/dL 9.5 9.4 9.3  Total Protein 6.5 - 8.1 g/dL 8.1 8.0 7.7  Total Bilirubin 0.3 - 1.2 mg/dL 0.4 0.4 0.3  Alkaline Phos 38 - 126 U/L 98 108 96  AST 15 - 41 U/L _1 ALT 0 - 44 U/L _2 Lab Results  Component Value Date   WBC 4.0 07/24/2019   HGB 15.1 (H) 07/24/2019    HCT 45.3 07/24/2019   MCV 89.9 07/24/2019   PLT 284 07/24/2019   NEUTROABS 2.4 07/24/2019    ASSESSMENT & PLAN:  Malignant neoplasm of upper-outer quadrant of left breast in female, estrogen receptor positive ([place]) 01/02/2018: Palpable lump left breastsince December 2018, mammogram: Left breast UOQ 2.3 cm mass, axilla has several enlarged lymph nodes; 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  Status post neoadjuvant chemotherapy with dose dense Adriamycin Cytoxan x4 followed by Abraxane weekly x12  07/02/2018:Bilateral mastectomies: Left mastectomy: IDC grade 2, 0.9 cm, margins negative, 1/4 lymph nodes positive with extracapsular extension, margins negative ypT1b ypN1 ER 75% strong, PR 10% strong, HER-2 negative, Ki-67 60% 07/23/2018: Left axillary lymph node dissection: 0/4 LN Adjuvant radiation therapy 09/07/2018-10/23/2018 ----------------------------------------------------------------------------------------------------------------------------------- [name] clinical trial: Phase 3 clinical trial in patients or estrogen receptor positive randomized between standard antiestrogen therapy versus antiestrogen therapy with ribociclib 400 mg for 36 months. Discontinued after 3 cycles because of toxicities (nausea, metallic taste, numbness of the tongue) Adverse effects to ribociclib have resolved.  Shehadbilateral salpingo-oophorectomy on 03/06/2019. Breast reconstruction surgery 04/19/2019  Current treatment: Anastrozole Anastrozole toxicities: Hot flashes much improved after Effexorcurrently at 75 mg daily. Hot flashes have been up and down. Chemo-induced peripheral neuropathy:Resolved  Surveillance: No role of imaging studies 07/24/2019: Bilateral reconstructed breasts breast examination no palpable lumps or nodules of concern  Return to clinic in 3 months for  follow-up    No orders of the defined types were placed in this encounter.  The patient has a good understanding of the overall plan. she agrees with it. she will call with any problems that may develop before the next visit here.  Nicholas Lose, MD 07/24/2019  Julious Oka Dorshimer, am acting as scribe for Dr. Nicholas Lose.  I have reviewed the above document for accuracy and completeness, and I agree with the above.

## 2019-07-23 NOTE — Telephone Encounter (Signed)
Kristi [name]; Called patient to confirm her appointments for tomorrow, reminding her to fast for at least 8 hrs before her lab appointment at 8 am, to not take Anastrozole at home,bring Anastrozole to take in clinic tomorrow and to bring her medication diary.  Informed patient there is no [name] ECG for tomorrow's visit.  There are questionnaires to complete tomorrow and there is also a new consent form to review/sign. Asked patient if she can come in 15 minutes early to review the changes in the consent form prior to lab appt and she agreed. Thanked patient for her time and look forward to seeing her tomorrow. [name], BSN, RN Clinical Research Nurse 07/23/2019 10:41 AM

## 2019-07-24 ENCOUNTER — Inpatient Hospital Stay (HOSPITAL_BASED_OUTPATIENT_CLINIC_OR_DEPARTMENT_OTHER): Payer: Commercial Managed Care - PPO | Admitting: Hematology and Oncology

## 2019-07-24 ENCOUNTER — Encounter: Payer: Self-pay | Admitting: *Deleted

## 2019-07-24 ENCOUNTER — Other Ambulatory Visit: Payer: Self-pay

## 2019-07-24 ENCOUNTER — Inpatient Hospital Stay: Payer: Commercial Managed Care - PPO

## 2019-07-24 DIAGNOSIS — C50412 Malignant neoplasm of upper-outer quadrant of left female breast: Secondary | ICD-10-CM

## 2019-07-24 DIAGNOSIS — Z170 Estrogen receptor positive status [ER+]: Secondary | ICD-10-CM

## 2019-07-24 DIAGNOSIS — R000 Tachycardia, unspecified: Secondary | ICD-10-CM

## 2019-07-24 DIAGNOSIS — Z006 Encounter for examination for normal comparison and control in clinical research program: Secondary | ICD-10-CM

## 2019-07-24 DIAGNOSIS — Z0000 Encounter for general adult medical examination without abnormal findings: Secondary | ICD-10-CM

## 2019-07-24 LAB — LACTATE DEHYDROGENASE: LDH: 156 U/L (ref 98–192)

## 2019-07-24 LAB — CBC WITH DIFFERENTIAL (CANCER CENTER ONLY)
Abs Immature Granulocytes: 0.01 10*3/uL (ref 0.00–0.07)
Basophils Absolute: 0 10*3/uL (ref 0.0–0.1)
Basophils Relative: 1 %
Eosinophils Absolute: 0.2 10*3/uL (ref 0.0–0.5)
Eosinophils Relative: 5 %
HCT: 45.3 % (ref 36.0–46.0)
Hemoglobin: 15.1 g/dL — ABNORMAL HIGH (ref 12.0–15.0)
Immature Granulocytes: 0 %
Lymphocytes Relative: 28 %
Lymphs Abs: 1.1 10*3/uL (ref 0.7–4.0)
MCH: 30 pg (ref 26.0–34.0)
MCHC: 33.3 g/dL (ref 30.0–36.0)
MCV: 89.9 fL (ref 80.0–100.0)
Monocytes Absolute: 0.2 10*3/uL (ref 0.1–1.0)
Monocytes Relative: 6 %
Neutro Abs: 2.4 10*3/uL (ref 1.7–7.7)
Neutrophils Relative %: 60 %
Platelet Count: 284 10*3/uL (ref 150–400)
RBC: 5.04 MIL/uL (ref 3.87–5.11)
RDW: 12.4 % (ref 11.5–15.5)
WBC Count: 4 10*3/uL (ref 4.0–10.5)
nRBC: 0 % (ref 0.0–0.2)

## 2019-07-24 LAB — HEMOGLOBIN A1C
Hgb A1c MFr Bld: 4.8 % (ref 4.8–5.6)
Mean Plasma Glucose: 91.06 mg/dL

## 2019-07-24 LAB — CMP (CANCER CENTER ONLY)
ALT: 23 U/L (ref 0–44)
AST: 21 U/L (ref 15–41)
Albumin: 4.7 g/dL (ref 3.5–5.0)
Alkaline Phosphatase: 98 U/L (ref 38–126)
Anion gap: 13 (ref 5–15)
BUN: 10 mg/dL (ref 6–20)
CO2: 26 mmol/L (ref 22–32)
Calcium: 9.5 mg/dL (ref 8.9–10.3)
Chloride: 101 mmol/L (ref 98–111)
Creatinine: 0.73 mg/dL (ref 0.44–1.00)
GFR, Est AFR Am: >60 mL/min (ref >60)
GFR, Estimated: >60 mL/min (ref >60)
Glucose, Bld: 79 mg/dL (ref 70–99)
Potassium: 4.3 mmol/L (ref 3.5–5.1)
Sodium: 140 mmol/L (ref 135–145)
Total Bilirubin: 0.4 mg/dL (ref 0.3–1.2)
Total Protein: 8.1 g/dL (ref 6.5–8.1)

## 2019-07-24 LAB — GAMMA GT: GGT: 19 U/L (ref 7–50)

## 2019-07-24 LAB — MAGNESIUM: Magnesium: 2.3 mg/dL (ref 1.7–2.4)

## 2019-07-24 LAB — URIC ACID: Uric Acid, Serum: 4.4 mg/dL (ref 2.5–7.1)

## 2019-07-24 LAB — BILIRUBIN, DIRECT: Bilirubin, Direct: 0.1 mg/dL (ref 0.0–0.2)

## 2019-07-24 LAB — PHOSPHORUS: Phosphorus: 4.1 mg/dL (ref 2.5–4.6)

## 2019-07-24 LAB — AMYLASE: Amylase: 86 U/L (ref 28–100)

## 2019-07-24 LAB — LIPASE, BLOOD: Lipase: 54 U/L — ABNORMAL HIGH (ref 11–51)

## 2019-07-24 NOTE — Assessment & Plan Note (Signed)
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  Status post neoadjuvant chemotherapy with dose dense Adriamycin Cytoxan x4 followed by Abraxane weekly x12  07/02/2018:Bilateral mastectomies: Left mastectomy: IDC grade 2, 0.9 cm, margins negative, 1/4 lymph nodes positive with extracapsular extension, margins negative ypT1b ypN1 ER 75% strong, PR 10% strong, HER-2 negative, Ki-67 60% 07/23/2018: Left axillary lymph node dissection: 0/4 LN Adjuvant radiation therapy 09/07/2018-10/23/2018 ----------------------------------------------------------------------------------------------------------------------------------- [name] clinical trial: Phase 3 clinical trial in patients or estrogen receptor positive randomized between standard antiestrogen therapy versus antiestrogen therapy with ribociclib 400 mg for 36 months. Discontinued after 3 cycles because of toxicities (nausea, metallic taste, numbness of the tongue) Adverse effects to ribociclib have resolved.  Shehadbilateral salpingo-oophorectomy on 03/06/2019. Breast reconstruction surgery 04/19/2019  Current treatment: Anastrozole Anastrozole toxicities: Hot flashes much improved after Effexorcurrently at 75 mg daily. Hot flashes have been up and down. Chemo-induced peripheral neuropathy:Resolved  Surveillance: No role of imaging studies Breast examination no palpable lumps or nodules of concern  Return to clinic in 1 year for follow-up

## 2019-07-24 NOTE — Research (Signed)
Cycle 7, Day 1 [name] Visit: Patient into clinic unaccompanied this morning to complete the study activities prior to starting on cycle 7. RE-CONSENT PV 5.1:  Reviewed new consent form version 5.1, IRB approved 06/22/19, with patient prior to study assessments. Spent 10 minutes reviewing consent form and paying particular attention to the new information included at the top of page 16 regarding results from pre-clinical rat studies. Patient verbalized understanding of changes and denied any questions about updated version. Patient agreed to continue on study and agreed to continue to allow optional pharmacogenetic sample and additional research using personal data and biological samples. Patient signed and dated all 3 sections. This RN signed and dated each section as did Dr. Lindi Adie. Patient given copy of the signed consent form for her records.  Labs: Fasting labs were collected this morning per protocol. Patient confirmed she has been fasting at least 8 hours. Results reviewed by Dr. Lindi Adie and he states the elevated Lipase, Hgb are not considered clinically significant.  Corrected Calcium Formula:  4 g/dL utilized for "normal albumin;" local normal range is 3.5-5 g/dL. Corrected Calcium = (0.8 x [normal albumin - patient's albumin]) + serum calcium Correct Calcium = (0.8 x [4-4.7]) + 9.5 Corrected Calcium = 8.94 VS: Completed per protocol with BP obtained after patient has been resting x 5 minutes.See VS flow sheet. MD: Patient was seen by Dr. Lindi Adie. Please see his Office Encounter from today. Concomitant Meds:  Patient reports starting Nurtec and increased dose of Gabapentin since last visit. Medication list updated. Adverse Events: Patient reports no new or worsening AEs since last visit. Patient reported difficulty with "remembering things" on her questionnaire. When asked about this, patient states memory problems started during her neo-adjuvant chemotherapy prior to enrolling on this study.   Patient states it has not gotten worse since starting on study and has not improved either. Patient says she is still able to work and complete her usual ADLs and instrumental ADLs but it is frustrating. She has learned to keep notes and lists to remember things she used to remember without making notes. This will be reported as baseline AE. See AE table below.  Endocrine Therapy: Cycle 6 medication diary collected. Patient reports 100% compliance with ET intake and diary reflects this. New medication diaries for cycles 7, 8 and 9 given to patient and reminded on completing daily and returning both pages of diaries on her next visit. Patient verbalized understanding. She took Anastrozole in clinic after all study assessments were completed and documented in medication diary at 9:00 am. Plan: Patient will return in 12 weeks for her Cycle 10 visit 10/16/19. Thanked patient for her ongoing participation in this study and encouraged her to call for any new questions or concerns prior to next visit. She verbalized understanding.  AEs: Event Grade Onset Date End Date Attribution to Anastrozole Attributionto Ribociclib Status Comments  Allergic Rhinitis 2 2005  No No Ongoing; baseline   Headache 1 2000  No No Ongoing; basline   Hot Flashes 2 12/2017  Yes No Ongoing; baseline Intermittent  Memory Impairment 1 02/2019  No No Ongoing; baseline   HTN 2 05/29/19 07/24/19 No No resolved Decreased to gr 1  HTN 1 07/24/19  No No Ongoing   Tachycardia 1 06/05/19  No No Ongoing Intermittent  Discolored Tooth 1 04/26/19  No No Ongoing   Gum Inflammation 1 06/24/19  No No Ongoing    [name], BSN, Therapist, sports Nurse 07/24/2019

## 2019-07-25 LAB — THYROID PANEL WITH TSH
Free Thyroxine Index: 2 (ref 1.2–4.9)
T3 Uptake Ratio: 25 % (ref 24–39)
T4, Total: 8.1 ug/dL (ref 4.5–12.0)
TSH: 1.75 u[IU]/mL (ref 0.450–4.500)

## 2019-07-26 ENCOUNTER — Telehealth: Payer: Self-pay | Admitting: Hematology and Oncology

## 2019-07-26 NOTE — Telephone Encounter (Signed)
No new los during check out. 

## 2019-07-30 ENCOUNTER — Telehealth: Payer: Self-pay | Admitting: Hematology and Oncology

## 2019-07-30 NOTE — Telephone Encounter (Signed)
Scheduled appt per 1/5 sch message - mailed reminder letter with appt date and time

## 2019-08-13 ENCOUNTER — Other Ambulatory Visit (HOSPITAL_COMMUNITY): Payer: Commercial Managed Care - PPO

## 2019-08-13 ENCOUNTER — Encounter (HOSPITAL_COMMUNITY): Payer: Commercial Managed Care - PPO | Admitting: Cardiology

## 2019-08-21 ENCOUNTER — Ambulatory Visit: Payer: Commercial Managed Care - PPO | Admitting: Registered Nurse

## 2019-08-30 ENCOUNTER — Ambulatory Visit: Payer: Commercial Managed Care - PPO | Admitting: Registered Nurse

## 2019-10-10 ENCOUNTER — Telehealth: Payer: Self-pay | Admitting: *Deleted

## 2019-10-10 NOTE — Telephone Encounter (Signed)
Kristi [name]; Patient called to see if she could reschedule her [name] visit next week on 3/24 from am to pm due to work conflict.  Dr. Lindi Adie has opening at 3:15 pm and patient would have to come in at 2:45 pm for lab.  If this is not possible, then we will try for the next morning on Thursday 3/25.  Patient agreed and will look for schedule changes on MyChart.  Research nurse will call patient back if we have to schedule to a different day other than what we just discussed. Thanked patient for calling and look forward to seeing her next week.  Scheduling message request sent.  [name], BSN, RN Clinical Research Nurse 10/10/2019 9:50 AM

## 2019-10-15 ENCOUNTER — Telehealth: Payer: Self-pay | Admitting: *Deleted

## 2019-10-15 DIAGNOSIS — C50412 Malignant neoplasm of upper-outer quadrant of left female breast: Secondary | ICD-10-CM

## 2019-10-15 DIAGNOSIS — Z170 Estrogen receptor positive status [ER+]: Secondary | ICD-10-CM

## 2019-10-15 NOTE — Progress Notes (Signed)
Patient Care Team: Patient, [name] as PCP - General (General Practice) Nicholas Lose, MD as Consulting Physician (Hematology and Oncology) Rolm Bookbinder, MD as Consulting Physician (General Surgery) Delice Bison, [name], NP as Nurse Practitioner (Hematology and Oncology) Eppie Gibson, MD as Attending Physician (Radiation Oncology)  DIAGNOSIS:    ICD-10-CM   1. Malignant neoplasm of upper-outer quadrant of left breast in female, estrogen receptor positive ([place])  C50.412    Z17.0     SUMMARY OF ONCOLOGIC HISTORY: Oncology History  Malignant neoplasm of upper-outer quadrant of left breast in female, estrogen receptor positive ([place])  01/02/2018 Initial Diagnosis   Palpable lump left breast, mammogram: Left breast UOQ 2.3 cm mass, axilla has several enlarged lymph nodes; 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   01/04/2018 Cancer Staging   Staging form: Breast, AJCC 8th Edition - Clinical: Stage IIB (cT2, cN1, cM0, G3, ER+, PR+, HER2: Equivocal) - Signed by Nicholas Lose, MD on 01/04/2018   01/16/2018 - 06/18/2018 Neo-Adjuvant Chemotherapy   Dose dense Adriamycin and Cytoxan x4 followed by Taxol, switched to Abraxane with cycle 2 after Taxol reaction with palpitations weekly x12   01/19/2018 Genetic Testing   STK11 c.608C>T VUS identified on the Multicancer panel.  The Multi-Gene Panel offered by [facility] includes sequencing and/or deletion duplication testing of the following 83 genes: ALK, APC, ATM, AXIN2,BAP1,  BARD1, BLM, BMPR1A, BRCA1, BRCA2, BRIP1, CASR, CDC73, CDH1, CDK4, CDKN1B, CDKN1C, CDKN2A (p14ARF), CDKN2A (p16INK4a), CEBPA, CHEK2, CTNNA1, DICER1, DIS3L2, EGFR (c.2369C>T, p.Thr790Met variant only), EPCAM (Deletion/duplication testing only), FH, FLCN, GATA2, GPC3, GREM1 (Promoter region deletion/duplication testing only), HOXB13 (c.251G>A, p.Gly84Glu), HRAS, KIT,  MAX, MEN1, MET, MITF (c.952G>A, p.Glu318Lys variant only), MLH1, MSH2, MSH3, MSH6, MUTYH, NBN, NF1, NF2, NTHL1, PALB2, PDGFRA, PHOX2B, PMS2, POLD1, POLE, POT1, PRKAR1A, PTCH1, PTEN, RAD50, RAD51C, RAD51D, RB1, RECQL4, RET, RUNX1, SDHAF2, SDHA (sequence changes only), SDHB, SDHC, SDHD, SMAD4, SMARCA4, SMARCB1, SMARCE1, STK11, SUFU, TERT, TERT, TMEM127, TP53, TSC1, TSC2, VHL, WRN and WT1.  The report date is January 19, 2018.   06/13/2018 Breast MRI   Significant improvement of left breast cancer previously 2.4 x 3.2 x 4 cm, currently 1.4 x 1.3 x 0.8 cm, no lymph nodes   07/02/2018 Surgery   Bilateral mastectomies: Left mastectomy: IDC grade 2, 0.9 cm, margins negative, 1/4 lymph nodes positive with extracapsular extension, margins negative ypT1b ypN1 ER 75% strong, PR 70% strong, HER-2 negative, Ki-67 60%   07/24/2018 Surgery   Left axillary lymph node dissection: 0/4 lymph nodes   09/07/2018 - 10/22/2018 Radiation Therapy   Adjuvant radiation   10/2018 -  Anti-estrogen oral therapy   Zoladex and Anastrozole   02/06/2019 - 04/02/2019 Chemotherapy   The patient had [No matching medication found in this treatment plan]  for chemotherapy treatment.      CHIEF COMPLIANT: Follow-up of left breast cancer on [name] clinical trial  INTERVAL HISTORY: Kristi [name] is a 44 y.o. with above-mentioned history of breast cancer treated with neoadjuvant chemotherapy,bilateral mastectomies, radiation, and who is currently on adjuvant antiestrogen therapy with Zoladex and anastrozole. She is a participant in the [place] clinical trial and was randomized to the ribociclib arm, but ribociclib was discontinued. She presents to the clinic todayfor follow-up. She is tolerating anastrozole extremely well without any problems or concerns.  She feels back to her normal self.  Denies any lumps or nodules.  ALLERGIES:  is  allergic to sulfa antibiotics; chlorhexidine gluconate; and other.  MEDICATIONS:    Current Outpatient Medications  Medication Sig Dispense Refill  . anastrozole (ARIMIDEX) 1 MG tablet Take 1 tablet (1 mg total) by mouth daily. 90 tablet 3  . eletriptan (RELPAX) 40 MG tablet Take 40 mg by mouth every 2 (two) hours as needed for migraine (max 2 doses per 24 hrs.).   3  . EMGALITY 120 MG/ML SOAJ Inject 120 mg into the skin every 30 (thirty) days.     . fexofenadine (ALLEGRA) 180 MG tablet Take 180 mg by mouth daily as needed for allergies (Pt alternates with Xyzal).     . gabapentin (NEURONTIN) 300 MG capsule TAKE ONE CAPSULE BY MOUTH AT BEDTIME (Patient taking differently: Take 900 mg by mouth at bedtime. ) 30 capsule 2  . Rimegepant Sulfate (NURTEC) 75 MG TBDP Take 75 mg by mouth as needed (migraines).    . venlafaxine XR (EFFEXOR-XR) 75 MG 24 hr capsule Take 75 mg by mouth daily with breakfast.     No current facility-administered medications for this visit.   Facility-Administered Medications Ordered in Other Visits  Medication Dose Route Frequency Provider Last Rate Last Admin  . sodium chloride flush (NS) 0.9 % injection 10 mL  10 mL Intravenous PRN Nicholas Lose, MD   10 mL at 04/13/18 1529    PHYSICAL EXAMINATION: ECOG PERFORMANCE STATUS: 0 - Asymptomatic  Vitals:   10/16/19 1451  BP: 117/79  Pulse: 94  Resp: 17  Temp: 98 F (36.7 C)  SpO2: 100%   Filed Weights   10/16/19 1451  Weight: 138 lb 4.8 oz (62.7 kg)    LABORATORY DATA:  I have reviewed the data as listed CMP Latest Ref Rng & Units 10/16/2019 07/24/2019 06/26/2019  Glucose 70 - 99 mg/dL 80 79 89  BUN 6 - 20 mg/dL 9 10 11   Creatinine 0.44 - 1.00 mg/dL 0.75 0.73 0.78  Sodium 135 - 145 mmol/L 138 140 138  Potassium 3.5 - 5.1 mmol/L 4.0 4.3 4.1  Chloride 98 - 111 mmol/L 101 101 99  CO2 22 - 32 mmol/L 26 26 27   Calcium 8.9 - 10.3 mg/dL 9.8 9.5 9.4  Total Protein 6.5 - 8.1 g/dL 8.2(H) 8.1 8.0  Total Bilirubin 0.3 - 1.2 mg/dL 0.8 0.4 0.4  Alkaline Phos 38 - 126 U/L 106 98 108  AST 15 - 41 U/L  20 21 24   ALT 0 - 44 U/L 25 23 27     Lab Results  Component Value Date   WBC 5.3 10/16/2019   HGB 15.0 10/16/2019   HCT 44.9 10/16/2019   MCV 88.9 10/16/2019   PLT 319 10/16/2019   NEUTROABS 3.8 10/16/2019    ASSESSMENT & PLAN:  Malignant neoplasm of upper-outer quadrant of left breast in female, estrogen receptor positive ([place]) 01/02/2018: Palpable lump left breastsince December 2018, mammogram: Left breast UOQ 2.3 cm mass, axilla has several enlarged lymph nodes; 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  Status post neoadjuvant chemotherapy with dose dense Adriamycin Cytoxan x4 followed by Abraxane weekly x12  07/02/2018:Bilateral mastectomies: Left mastectomy: IDC grade 2, 0.9 cm, margins negative, 1/4 lymph nodes positive with extracapsular extension, margins negative ypT1b ypN1 ER 75% strong, PR 10% strong, HER-2 negative, Ki-67 60% 07/23/2018: Left axillary lymph node dissection: 0/4 LN Adjuvant radiation therapy 09/07/2018-10/23/2018 ----------------------------------------------------------------------------------------------------------------------------------- [name] clinical trial: Phase 3 clinical trial in  patients or estrogen receptor positive randomized between standard antiestrogen therapy versus antiestrogen therapy with ribociclib 400 mg for 36 months. Discontinued after 3 cycles because of toxicities (nausea, metallic taste, numbness of the tongue) Adverse effects to ribociclib have resolved.  Shehadbilateral salpingo-oophorectomy on 03/06/2019. Breast reconstruction surgery 04/19/2019  Current treatment: Anastrozole Anastrozole toxicities: Hot flashes much improved after Effexorcurrently at 75 mg daily.  She also takes gabapentin for headaches and it appears to have helped with her hot flashes significantly.  They are not bothering her much. Chemo-induced  peripheral neuropathy:Resolved  Surveillance: No role of imaging studies No clinical evidence of breast cancer recurrence.  Return to clinic in 3 months for follow-up    No orders of the defined types were placed in this encounter.  The patient has a good understanding of the overall plan. she agrees with it. she will call with any problems that may develop before the next visit here.  Total time spent: 20 mins including face to face time and time spent for planning, charting and coordination of care  Nicholas Lose, MD 10/16/2019  I, Cloyde Reams Dorshimer, am acting as scribe for Dr. Nicholas Lose.  I have reviewed the above documentation for accuracy and completeness, and I agree with the above.

## 2019-10-15 NOTE — Telephone Encounter (Signed)
Kristi [name]; Reminded patient to fast for at least 8 hours prior to lab appointment tomorrow afternoon. She can drink water.  Also reminded patient to bring in her ET diaries and anastrozole. Do not take anastrozole at home tomorrow as she will need to take in clinic after [name] assessments completed.  Patient verbalized understanding. Thanked patient for her time and look forward to seeing her tomorrow.  [name], BSN, RN Clinical Research Nurse 10/15/2019 11:20 AM

## 2019-10-16 ENCOUNTER — Other Ambulatory Visit: Payer: Self-pay

## 2019-10-16 ENCOUNTER — Ambulatory Visit: Payer: Commercial Managed Care - PPO | Admitting: Hematology and Oncology

## 2019-10-16 ENCOUNTER — Inpatient Hospital Stay (HOSPITAL_BASED_OUTPATIENT_CLINIC_OR_DEPARTMENT_OTHER): Payer: Self-pay | Admitting: Hematology and Oncology

## 2019-10-16 ENCOUNTER — Inpatient Hospital Stay: Payer: Self-pay | Attending: Hematology and Oncology

## 2019-10-16 ENCOUNTER — Other Ambulatory Visit: Payer: Commercial Managed Care - PPO

## 2019-10-16 ENCOUNTER — Encounter: Payer: Self-pay | Admitting: *Deleted

## 2019-10-16 DIAGNOSIS — Z170 Estrogen receptor positive status [ER+]: Secondary | ICD-10-CM

## 2019-10-16 DIAGNOSIS — C50412 Malignant neoplasm of upper-outer quadrant of left female breast: Secondary | ICD-10-CM

## 2019-10-16 DIAGNOSIS — Z923 Personal history of irradiation: Secondary | ICD-10-CM | POA: Insufficient documentation

## 2019-10-16 DIAGNOSIS — Z9221 Personal history of antineoplastic chemotherapy: Secondary | ICD-10-CM | POA: Insufficient documentation

## 2019-10-16 DIAGNOSIS — Z006 Encounter for examination for normal comparison and control in clinical research program: Secondary | ICD-10-CM

## 2019-10-16 DIAGNOSIS — Z9013 Acquired absence of bilateral breasts and nipples: Secondary | ICD-10-CM | POA: Insufficient documentation

## 2019-10-16 DIAGNOSIS — Z79811 Long term (current) use of aromatase inhibitors: Secondary | ICD-10-CM | POA: Insufficient documentation

## 2019-10-16 LAB — CBC WITH DIFFERENTIAL (CANCER CENTER ONLY)
Abs Immature Granulocytes: 0.02 10*3/uL (ref 0.00–0.07)
Basophils Absolute: 0 10*3/uL (ref 0.0–0.1)
Basophils Relative: 1 %
Eosinophils Absolute: 0 10*3/uL (ref 0.0–0.5)
Eosinophils Relative: 0 %
HCT: 44.9 % (ref 36.0–46.0)
Hemoglobin: 15 g/dL (ref 12.0–15.0)
Immature Granulocytes: 0 %
Lymphocytes Relative: 24 %
Lymphs Abs: 1.3 10*3/uL (ref 0.7–4.0)
MCH: 29.7 pg (ref 26.0–34.0)
MCHC: 33.4 g/dL (ref 30.0–36.0)
MCV: 88.9 fL (ref 80.0–100.0)
Monocytes Absolute: 0.2 10*3/uL (ref 0.1–1.0)
Monocytes Relative: 4 %
Neutro Abs: 3.8 10*3/uL (ref 1.7–7.7)
Neutrophils Relative %: 71 %
Platelet Count: 319 10*3/uL (ref 150–400)
RBC: 5.05 MIL/uL (ref 3.87–5.11)
RDW: 12.9 % (ref 11.5–15.5)
WBC Count: 5.3 10*3/uL (ref 4.0–10.5)
nRBC: 0 % (ref 0.0–0.2)

## 2019-10-16 LAB — CMP (CANCER CENTER ONLY)
ALT: 25 U/L (ref 0–44)
AST: 20 U/L (ref 15–41)
Albumin: 4.9 g/dL (ref 3.5–5.0)
Alkaline Phosphatase: 106 U/L (ref 38–126)
Anion gap: 11 (ref 5–15)
BUN: 9 mg/dL (ref 6–20)
CO2: 26 mmol/L (ref 22–32)
Calcium: 9.8 mg/dL (ref 8.9–10.3)
Chloride: 101 mmol/L (ref 98–111)
Creatinine: 0.75 mg/dL (ref 0.44–1.00)
GFR, Est AFR Am: >60 mL/min (ref >60)
GFR, Estimated: >60 mL/min (ref >60)
Glucose, Bld: 80 mg/dL (ref 70–99)
Potassium: 4 mmol/L (ref 3.5–5.1)
Sodium: 138 mmol/L (ref 135–145)
Total Bilirubin: 0.8 mg/dL (ref 0.3–1.2)
Total Protein: 8.2 g/dL — ABNORMAL HIGH (ref 6.5–8.1)

## 2019-10-16 LAB — PHOSPHORUS: Phosphorus: 4 mg/dL (ref 2.5–4.6)

## 2019-10-16 LAB — LIPASE, BLOOD: Lipase: 41 U/L (ref 11–51)

## 2019-10-16 LAB — URIC ACID: Uric Acid, Serum: 4.4 mg/dL (ref 2.5–7.1)

## 2019-10-16 LAB — GAMMA GT: GGT: 17 U/L (ref 7–50)

## 2019-10-16 LAB — AMYLASE: Amylase: 68 U/L (ref 28–100)

## 2019-10-16 LAB — BILIRUBIN, DIRECT: Bilirubin, Direct: 0.2 mg/dL (ref 0.0–0.2)

## 2019-10-16 LAB — LACTATE DEHYDROGENASE: LDH: 121 U/L (ref 98–192)

## 2019-10-16 LAB — MAGNESIUM: Magnesium: 2.2 mg/dL (ref 1.7–2.4)

## 2019-10-16 MED ORDER — ANASTROZOLE 1 MG PO TABS: 1.0000 mg | ORAL_TABLET | Freq: Every day | ORAL | 3 refills | Status: DC

## 2019-10-16 NOTE — Research (Signed)
Cycle 10, Day 1 [name] Visit: Patient into clinic unaccompanied this morning to complete the study activities prior to starting on cycle 10. Labs: Fasting labs were collected this morning per protocol. Patient confirmed she has been fasting at least 8 hours. Results reviewed by Dr. Lindi Adie and he states the elevated protein level is not clinically significant. All other results were wnl.   Corrected Calcium Formula:  4 g/dL utilized for "normal albumin;" local normal range is 3.5-5 g/dL. Corrected Calcium = (0.8 x [normal albumin - patient's albumin]) + serum calcium Correct Calcium = (0.8 x [4-4.9]) + 9.8 Corrected Calcium = 9.08 VS: Completed per protocol with BP obtained after patient has been resting x 5 minutes.See VS flow sheet. MD: Patient was seen by Dr. Lindi Adie. Please see his Office Encounter from today. Concomitant Meds:  Patient reports she is not taking Xyzal anymore, just Allegra for allergies. Medication list updated. Adverse Events: Patient reports no new or worsening AEs since last visit. States no heart palpitations or tachycardia since the beginning of this year. She also reports hot flashes gradually improved to mild at the beginning of this year which she attributes to increasing gabapentin to 900 mg at bedtime in December. She had moderate hot flashes at baseline.  No changes in her gums or discolored tooth that she is aware of since she has not been back to see dentist since last study visit. See AE table below.  Endocrine Therapy: Cycle 7, 8 and 9 medication diaries collected. Patient reports 100% compliance with ET intake and diaries reflect this. New medication diaries for cycles 10, 11 and 12 given to patient and reminded on completing daily and returning both pages of diaries on her next visit. Patient verbalized understanding. She took Anastrozole in clinic after all study assessments were completed and documented in medication diary as taking at 2:30 pm.  Plan: Patient  scheduled to return in 12 weeks for her Cycle 13 visit on 01/08/20. Thanked patient for her ongoing participation in this study and encouraged her to call for any new questions or concerns prior to next visit. She verbalized understanding.  AEs: Event Grade Onset Date End Date Attribution to Anastrozole Attributionto Ribociclib Status Comments  Allergic Rhinitis 2 2005  No No Ongoing; baseline   Headache 1 2000  No No Ongoing; basline   Hot Flashes 2 12/2017 07/26/19 Yes No Ongoing; baseline Intermittent  Hot Flashes 1 07/26/19    Ongoing; baseline Decreased to grade 1  Memory Impairment 1 02/2019  No No Ongoing; baseline   HTN 1 07/24/19 10/16/19 No No resolved   Tachycardia 1 06/05/19 07/26/19 No No resolved Intermittent  Discolored Tooth 1 04/26/19  No No Ongoing   Gum Inflammation 1 06/24/19  No No Ongoing    [name], BSN, Therapist, sports Nurse 10/16/2019

## 2019-10-16 NOTE — Assessment & Plan Note (Signed)
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  Status post neoadjuvant chemotherapy with dose dense Adriamycin Cytoxan x4 followed by Abraxane weekly x12  07/02/2018:Bilateral mastectomies: Left mastectomy: IDC grade 2, 0.9 cm, margins negative, 1/4 lymph nodes positive with extracapsular extension, margins negative ypT1b ypN1 ER 75% strong, PR 10% strong, HER-2 negative, Ki-67 60% 07/23/2018: Left axillary lymph node dissection: 0/4 LN Adjuvant radiation therapy 09/07/2018-10/23/2018 ----------------------------------------------------------------------------------------------------------------------------------- [name] clinical trial: Phase 3 clinical trial in patients or estrogen receptor positive randomized between standard antiestrogen therapy versus antiestrogen therapy with ribociclib 400 mg for 36 months. Discontinued after 3 cycles because of toxicities (nausea, metallic taste, numbness of the tongue) Adverse effects to ribociclib have resolved.  Shehadbilateral salpingo-oophorectomy on 03/06/2019. Breast reconstruction surgery 04/19/2019  Current treatment: Anastrozole Anastrozole toxicities: Hot flashes much improved after Effexorcurrently at 75 mg daily. Hot flashes have been up and down. Chemo-induced peripheral neuropathy:Resolved  Surveillance: No role of imaging studies 10/16/2019: Bilateral reconstructed breasts breast examination no palpable lumps or nodules of concern  Return to clinic in 6 months for follow-up

## 2019-10-17 ENCOUNTER — Telehealth: Payer: Self-pay | Admitting: Hematology and Oncology

## 2019-10-17 NOTE — Telephone Encounter (Signed)
Scheduled per 03/24 los, patient has been called and notified. 

## 2019-10-18 ENCOUNTER — Telehealth: Payer: Self-pay | Admitting: Hematology and Oncology

## 2019-10-18 NOTE — Telephone Encounter (Signed)
Rescheduled office visit and added labs per 3/25 sch msg. Pt confirmed new appt date and time.

## 2019-12-02 ENCOUNTER — Encounter: Payer: Self-pay | Admitting: Hematology and Oncology

## 2019-12-18 ENCOUNTER — Telehealth: Payer: Self-pay | Admitting: Hematology and Oncology

## 2019-12-18 NOTE — Telephone Encounter (Signed)
Rescheduled appt to 6/21. Provider on PAL. Unable to contact pt.  Left voicemail.

## 2020-01-02 ENCOUNTER — Other Ambulatory Visit: Payer: Self-pay | Admitting: *Deleted

## 2020-01-02 ENCOUNTER — Telehealth: Payer: Self-pay | Admitting: *Deleted

## 2020-01-02 DIAGNOSIS — Z170 Estrogen receptor positive status [ER+]: Secondary | ICD-10-CM

## 2020-01-02 DIAGNOSIS — C50412 Malignant neoplasm of upper-outer quadrant of left female breast: Secondary | ICD-10-CM

## 2020-01-02 NOTE — Telephone Encounter (Signed)
Kristi [name]; Cycle 13 visit was rescheduled from 6/16 to 6/22 which is outside the visit window for this [name].  Dr. Lindi Adie will be out of the office during the [name] visit window.  Wilber Bihari, NP, agreed to see patient for this visit.  Called patient to inform that her visit which was originally scheduled for 6/16 with Dr. Lindi Adie will be changed back to 6/16 with Kristi [name] same time. Asked her to disregard the appt for 6/22 as this will be changed back to 6/16.  Confirmed with patient that the original date/time is what works for her work schedule and she is not available to come in at a different date or time due to work.  Reminded patient to fast for at least 8 hrs prior to 2:30 pm Lab appt,  Bring her medication diaries and her ET to take in clinic that day.  Asked patient to contact research nurse if any problems or questions before next visit. She verbalized understanding.  [name], BSN, RN Clinical Research Nurse 01/02/2020 2:25 PM

## 2020-01-07 ENCOUNTER — Telehealth: Payer: Self-pay | Admitting: *Deleted

## 2020-01-07 NOTE — Telephone Encounter (Signed)
Upbeat Study; Called patient to remind her about study appointments tomorrow at 2:30 pm.  Left VM reminding patient to fast for at least 8 hrs (nothing to eat or drink except water after 6:30 am).  Reminded patient to bring in her completed medication diaries and anastrozole to take in clinic. Asked patient to call research nurse if any questions or problems. [name], BSN, RN Clinical Research Nurse 01/07/2020 3:58 PM

## 2020-01-08 ENCOUNTER — Other Ambulatory Visit: Payer: Self-pay

## 2020-01-08 ENCOUNTER — Ambulatory Visit: Payer: Self-pay | Admitting: Hematology and Oncology

## 2020-01-08 ENCOUNTER — Encounter: Payer: Self-pay | Admitting: *Deleted

## 2020-01-08 ENCOUNTER — Inpatient Hospital Stay: Payer: 59

## 2020-01-08 ENCOUNTER — Inpatient Hospital Stay: Payer: 59 | Attending: Hematology and Oncology | Admitting: Adult Health

## 2020-01-08 DIAGNOSIS — Z006 Encounter for examination for normal comparison and control in clinical research program: Secondary | ICD-10-CM

## 2020-01-08 DIAGNOSIS — Z170 Estrogen receptor positive status [ER+]: Secondary | ICD-10-CM | POA: Insufficient documentation

## 2020-01-08 DIAGNOSIS — Z923 Personal history of irradiation: Secondary | ICD-10-CM | POA: Insufficient documentation

## 2020-01-08 DIAGNOSIS — Z79811 Long term (current) use of aromatase inhibitors: Secondary | ICD-10-CM | POA: Insufficient documentation

## 2020-01-08 DIAGNOSIS — C50412 Malignant neoplasm of upper-outer quadrant of left female breast: Secondary | ICD-10-CM

## 2020-01-08 DIAGNOSIS — Z8041 Family history of malignant neoplasm of ovary: Secondary | ICD-10-CM

## 2020-01-08 DIAGNOSIS — Z90722 Acquired absence of ovaries, bilateral: Secondary | ICD-10-CM | POA: Insufficient documentation

## 2020-01-08 DIAGNOSIS — Z800 Family history of malignant neoplasm of digestive organs: Secondary | ICD-10-CM | POA: Insufficient documentation

## 2020-01-08 DIAGNOSIS — Z803 Family history of malignant neoplasm of breast: Secondary | ICD-10-CM

## 2020-01-08 DIAGNOSIS — Z9013 Acquired absence of bilateral breasts and nipples: Secondary | ICD-10-CM | POA: Insufficient documentation

## 2020-01-08 DIAGNOSIS — Z8042 Family history of malignant neoplasm of prostate: Secondary | ICD-10-CM | POA: Insufficient documentation

## 2020-01-08 DIAGNOSIS — Z9221 Personal history of antineoplastic chemotherapy: Secondary | ICD-10-CM | POA: Insufficient documentation

## 2020-01-08 LAB — CBC WITH DIFFERENTIAL (CANCER CENTER ONLY)
Abs Immature Granulocytes: 0.01 10*3/uL (ref 0.00–0.07)
Basophils Absolute: 0 10*3/uL (ref 0.0–0.1)
Basophils Relative: 1 %
Eosinophils Absolute: 0.1 10*3/uL (ref 0.0–0.5)
Eosinophils Relative: 1 %
HCT: 44.4 % (ref 36.0–46.0)
Hemoglobin: 14.9 g/dL (ref 12.0–15.0)
Immature Granulocytes: 0 %
Lymphocytes Relative: 27 %
Lymphs Abs: 1.6 10*3/uL (ref 0.7–4.0)
MCH: 30.2 pg (ref 26.0–34.0)
MCHC: 33.6 g/dL (ref 30.0–36.0)
MCV: 90.1 fL (ref 80.0–100.0)
Monocytes Absolute: 0.3 10*3/uL (ref 0.1–1.0)
Monocytes Relative: 5 %
Neutro Abs: 3.8 10*3/uL (ref 1.7–7.7)
Neutrophils Relative %: 66 %
Platelet Count: 335 10*3/uL (ref 150–400)
RBC: 4.93 MIL/uL (ref 3.87–5.11)
RDW: 12.4 % (ref 11.5–15.5)
WBC Count: 5.8 10*3/uL (ref 4.0–10.5)
nRBC: 0 % (ref 0.0–0.2)

## 2020-01-08 LAB — CMP (CANCER CENTER ONLY)
ALT: 21 U/L (ref 0–44)
AST: 18 U/L (ref 15–41)
Albumin: 4.7 g/dL (ref 3.5–5.0)
Alkaline Phosphatase: 88 U/L (ref 38–126)
Anion gap: 11 (ref 5–15)
BUN: 10 mg/dL (ref 6–20)
CO2: 24 mmol/L (ref 22–32)
Calcium: 9.8 mg/dL (ref 8.9–10.3)
Chloride: 101 mmol/L (ref 98–111)
Creatinine: 0.73 mg/dL (ref 0.44–1.00)
GFR, Est AFR Am: >60 mL/min (ref >60)
GFR, Estimated: >60 mL/min (ref >60)
Glucose, Bld: 82 mg/dL (ref 70–99)
Potassium: 3.9 mmol/L (ref 3.5–5.1)
Sodium: 136 mmol/L (ref 135–145)
Total Bilirubin: 0.6 mg/dL (ref 0.3–1.2)
Total Protein: 7.9 g/dL (ref 6.5–8.1)

## 2020-01-08 LAB — MAGNESIUM: Magnesium: 2.1 mg/dL (ref 1.7–2.4)

## 2020-01-08 LAB — BILIRUBIN, DIRECT: Bilirubin, Direct: 0.1 mg/dL (ref 0.0–0.2)

## 2020-01-08 LAB — URIC ACID: Uric Acid, Serum: 4.7 mg/dL (ref 2.5–7.1)

## 2020-01-08 LAB — AMYLASE: Amylase: 89 U/L (ref 28–100)

## 2020-01-08 LAB — LACTATE DEHYDROGENASE: LDH: 124 U/L (ref 98–192)

## 2020-01-08 LAB — GAMMA GT: GGT: 16 U/L (ref 7–50)

## 2020-01-08 LAB — RESEARCH LABS

## 2020-01-08 LAB — PHOSPHORUS: Phosphorus: 3.9 mg/dL (ref 2.5–4.6)

## 2020-01-08 LAB — LIPASE, BLOOD: Lipase: 59 U/L — ABNORMAL HIGH (ref 11–51)

## 2020-01-08 NOTE — Research (Signed)
Cycle 13, Day 1 [name] Visit: Patient into clinic unaccompanied this morning to complete the study activities prior to starting on cycle 13. Labs: Fasting labs were collected per protocol. Patient confirmed she has been fasting at least 8 hours. Results initially reviewed by Wilber Bihari, NP and later by Dr. Lindi Adie.  Elevated lipase is considered not clinically significant by Dr. Lindi Adie. FSH/Estradiol levels were drawn per study requirement to confirm menopausal status after bilateral oophorectomy.  Corrected Calcium Formula:  4 g/dL utilized for "normal albumin;" local normal range is 3.5-5 g/dL. Corrected Calcium = (0.8 x [normal albumin - patient's albumin]) + serum calcium Correct Calcium = (0.8 x [4-4.7]) + 9.8 Corrected Calcium = 9.24 VS: Completed per protocol with BP obtained after patient has been resting x 5 minutes.See VS flow sheet. MD: Patient was seen by Wilber Bihari, NP. Please see her Office Encounter from today. Concomitant Meds:  Patient reports she started taking Omega 3 and Biotin. She has also been taking Calcium with Vitamin D since starting on Anastrozole. Medication list updated. Adverse Events: Patient reports noticing some hair loss and thinning (grade 1, less than 50% hair loss) starting about 2 months ago. She started taking Biotin and Omega 3 supplements for this.  Patient also reports occasional feelings of heat intolerance when she goes outside and it is hot.  She states the heat sometimes makes her stomach upset and she has to go inside into air conditioning quickly to relieve her symptoms.  She remembers this happening a few times at the end of last summer and now that the weather is warmer again she has experienced it a few more times. See AE table below.  Endocrine Therapy: Cycles 10, 11 and 12 medication diaries collected. Patient reports 100% compliance with ET intake and diaries reflect this. New medication diaries for cycles 13, 14 and 15 given to patient and  reminded on completing daily and returning both pages of diaries on her next visit. Patient verbalized understanding. She took Anastrozole in clinic after all study assessments were completed and documented in medication diary at 1542.  Plan: Patient scheduled to return in 12 weeks for her Cycle 16 visit on 04/01/20. Thanked patient for her ongoing participation in this study and encouraged her to call for any new questions or concerns prior to next visit. She verbalized understanding.  AEs: Event Grade Onset Date End Date Attribution to Anastrozole Attributionto Ribociclib Status Comments  Allergic Rhinitis 2 2005  No No Ongoing; baseline   Headache 1 2000  No No Ongoing; basline   Hot Flashes 2 12/2017 07/26/19 Yes No baseline Intermittent  Hot Flashes 1 07/26/19    Ongoing; baseline Decreased to grade 1  Memory Impairment 1 02/2019  No No Ongoing; baseline   Discolored Tooth 1 04/26/19  No No Ongoing   Gum Inflammation 1 06/24/19  No No Ongoing   Alopecia 1 11/08/19  Yes No Ongoing Biotin, Omega 3  Heat Intolerance 1 12/24/19  Yes No Ongoing Recurred  Palpitations 1 07/01/19 07/26/19 No No Resolved Noted by Cardiologist on 07/01/19.   [name], BSN, RN Clinical Research Nurse 01/08/2020

## 2020-01-08 NOTE — Progress Notes (Signed)
[facility] Follow up:    Patient, [name] No address on file   DIAGNOSIS: Cancer Staging Malignant neoplasm of upper-outer quadrant of left breast in female, estrogen receptor positive ([place]) Staging form: Breast, AJCC 8th Edition - Clinical: Stage IIB (cT2, cN1, cM0, G3, ER+, PR+, HER2: Equivocal) - Signed by Nicholas Lose, MD on 01/04/2018 - Pathologic: No Stage Recommended (ypT1b, pN1, cM0, G2, ER+, PR+, HER2-) - Unsigned   SUMMARY OF ONCOLOGIC HISTORY: Oncology History  Malignant neoplasm of upper-outer quadrant of left breast in female, estrogen receptor positive ([place])  01/02/2018 Initial Diagnosis   Palpable lump left breast, mammogram: Left breast UOQ 2.3 cm mass, axilla has several enlarged lymph nodes; 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   01/04/2018 Cancer Staging   Staging form: Breast, AJCC 8th Edition - Clinical: Stage IIB (cT2, cN1, cM0, G3, ER+, PR+, HER2: Equivocal) - Signed by Nicholas Lose, MD on 01/04/2018   01/16/2018 - 06/18/2018 Neo-Adjuvant Chemotherapy   Dose dense Adriamycin and Cytoxan x4 followed by Taxol, switched to Abraxane with cycle 2 after Taxol reaction with palpitations weekly x12   01/19/2018 Genetic Testing   STK11 c.608C>T VUS identified on the Multicancer panel.  The Multi-Gene Panel offered by [facility] includes sequencing and/or deletion duplication testing of the following 83 genes: ALK, APC, ATM, AXIN2,BAP1,  BARD1, BLM, BMPR1A, BRCA1, BRCA2, BRIP1, CASR, CDC73, CDH1, CDK4, CDKN1B, CDKN1C, CDKN2A (p14ARF), CDKN2A (p16INK4a), CEBPA, CHEK2, CTNNA1, DICER1, DIS3L2, EGFR (c.2369C>T, p.Thr790Met variant only), EPCAM (Deletion/duplication testing only), FH, FLCN, GATA2, GPC3, GREM1 (Promoter region deletion/duplication testing only), HOXB13 (c.251G>A, p.Gly84Glu), HRAS, KIT, MAX, MEN1, MET, MITF (c.952G>A, p.Glu318Lys variant  only), MLH1, MSH2, MSH3, MSH6, MUTYH, NBN, NF1, NF2, NTHL1, PALB2, PDGFRA, PHOX2B, PMS2, POLD1, POLE, POT1, PRKAR1A, PTCH1, PTEN, RAD50, RAD51C, RAD51D, RB1, RECQL4, RET, RUNX1, SDHAF2, SDHA (sequence changes only), SDHB, SDHC, SDHD, SMAD4, SMARCA4, SMARCB1, SMARCE1, STK11, SUFU, TERT, TERT, TMEM127, TP53, TSC1, TSC2, VHL, WRN and WT1.  The report date is January 19, 2018.   06/13/2018 Breast MRI   Significant improvement of left breast cancer previously 2.4 x 3.2 x 4 cm, currently 1.4 x 1.3 x 0.8 cm, no lymph nodes   07/02/2018 Surgery   Bilateral mastectomies: Left mastectomy: IDC grade 2, 0.9 cm, margins negative, 1/4 lymph nodes positive with extracapsular extension, margins negative ypT1b ypN1 ER 75% strong, PR 70% strong, HER-2 negative, Ki-67 60%   07/24/2018 Surgery   Left axillary lymph node dissection: 0/4 lymph nodes   09/07/2018 - 10/22/2018 Radiation Therapy   Adjuvant radiation   10/2018 -  Anti-estrogen oral therapy   Zoladex and Anastrozole   02/06/2019 - 04/02/2019 Chemotherapy   The patient had [No matching medication found in this treatment plan]  for chemotherapy treatment.      CURRENT THERAPY: Anastrozole  INTERVAL HISTORY: Kristi [name] 44 y.o. female returns for evaluation of her estrogen positive breast cancer.  She continues to have hot flashes, but is otherwise feeling well and has no new concerns.  She is s/p BSO and is due to have lab testing today to confirm she is post menopausal, per [facility] clinical trial.    Patient Active Problem List   Diagnosis Date Noted  . Breast cancer, left ([place]) 07/02/2018  . Chemotherapy induced neutropenia ([place]) 06/06/2018  . Port-A-Cath in place 04/20/2018  . Genetic testing 01/29/2018  . Family history of breast  cancer   . Family history of prostate cancer   . Family history of ovarian cancer   . Family history of pancreatic cancer   . Malignant neoplasm of upper-outer quadrant of left breast in female, estrogen  receptor positive ([place]) 01/04/2018    is allergic to sulfa antibiotics, chlorhexidine gluconate, and other.  MEDICAL HISTORY: Past Medical History:  Diagnosis Date  . Allergy   . Anxiety   . Breast cancer, left ([place]) 12/2017  . Depression   . Endometriosis   . Family history of breast cancer   . Family history of ovarian cancer   . Family history of pancreatic cancer   . Family history of prostate cancer   . Gastritis determined by endoscopy    Inactive chronic gastritis per endoscopy  . History of chemotherapy    finished chemo 06/18/2018  . Hot flashes 01/16/2018   per patient since she started zoladex injections  . Migraines   . PONV (postoperative nausea and vomiting)   . Wears contact lenses     SURGICAL HISTORY: Past Surgical History:  Procedure Laterality Date  . AXILLARY LYMPH NODE DISSECTION Left 07/24/2018  . AXILLARY LYMPH NODE DISSECTION Left 07/24/2018   Procedure: LEFT AXILLARY LYMPH NODE DISSECTION;  Surgeon: Rolm Bookbinder, MD;  Location: [place];  Service: General;  Laterality: Left;  GENERAL WITH PEC BLOCK  . BREAST RECONSTRUCTION WITH PLACEMENT OF TISSUE EXPANDER AND ALLODERM Bilateral 07/02/2018   Procedure: BILATERAL BREAST RECONSTRUCTION WITH PLACEMENT OF TISSUE EXPANDER AND ACELLULAR DERMIS;  Surgeon: Irene Limbo, MD;  Location: [place];  Service: Plastics;  Laterality: Bilateral;  . COLONOSCOPY    . LAPAROSCOPIC SALPINGO OOPHERECTOMY Bilateral 03/06/2019   Procedure: LAPAROSCOPIC SALPINGO OOPHORECTOMY;  Surgeon: Jerelyn Charles, MD;  Location: [place];  Service: Gynecology;  Laterality: Bilateral;  DR. Carlis Abbott REQUESTING 2 1/2 HOURS OR TIME  . LIPOSUCTION WITH LIPOFILLING Bilateral 04/19/2019   Procedure: LIPOFILLING FROM ABDOMEN TO BILATERAL CHEST;  Surgeon: Irene Limbo, MD;  Location: [place];  Service: Plastics;  Laterality: Bilateral;  . MASTECTOMY WITH RADIOACTIVE SEED GUIDED EXCISION  AND AXILLARY SENTINEL LYMPH NODE BIOPSY Bilateral 07/02/2018   Procedure: BILATERAL NIPPLE SPARING MASTECTOMIES WITH LEFT AXILLARY SENTINEL LYMPH NODE BIOPSY AND LEFT SEED GUIDED NODE EXCISION;  Surgeon: Rolm Bookbinder, MD;  Location: [place];  Service: General;  Laterality: Bilateral;  . NASAL POLYP EXCISION    . OVARIAN CYST REMOVAL    . PORTA CATH REMOVAL Right 07/02/2018   Procedure: PORTA CATH REMOVAL;  Surgeon: Rolm Bookbinder, MD;  Location: [place];  Service: General;  Laterality: Right;  . PORTACATH PLACEMENT Right 01/15/2018   Procedure: INSERTION PORT-A-CATH WITH [place];  Surgeon: Rolm Bookbinder, MD;  Location: [place];  Service: General;  Laterality: Right;  . REMOVAL OF BILATERAL TISSUE EXPANDERS WITH PLACEMENT OF BILATERAL BREAST IMPLANTS Bilateral 04/19/2019   Procedure: REMOVAL OF BILATERAL TISSUE EXPANDERS WITH PLACEMENT OF BILATERAL SILICONE BREAST IMPLANTS;  Surgeon: Irene Limbo, MD;  Location: [place];  Service: Plastics;  Laterality: Bilateral;  . WISDOM TOOTH EXTRACTION      SOCIAL HISTORY: Social History   Socioeconomic History  . Marital status: Single    Spouse name: Not on file  . Number of children: Not on file  . Years of education: Not on file  . Highest education level: Not on file  Occupational History  . Not on file  Tobacco Use  . Smoking status: Never  Smoker  . Smokeless tobacco: Never Used  Vaping Use  . Vaping Use: Never used  Substance and Sexual Activity  . Alcohol use: Yes    Comment: rare  . Drug use: Never  . Sexual activity: Not on file  Other Topics Concern  . Not on file  Social History Narrative  . Not on file   Social Determinants of Health   Financial Resource Strain:   . Difficulty of Paying Living Expenses:   Food Insecurity:   . Worried About Charity fundraiser in the Last Year:   . Arboriculturist in the Last Year:   Transportation Needs:    . Film/video editor (Medical):   Marland Kitchen Lack of Transportation (Non-Medical):   Physical Activity:   . Days of Exercise per Week:   . Minutes of Exercise per Session:   Stress:   . Feeling of Stress :   Social Connections:   . Frequency of Communication with Friends and Family:   . Frequency of Social Gatherings with Friends and Family:   . Attends Religious Services:   . Active Member of Clubs or Organizations:   . Attends Archivist Meetings:   Marland Kitchen Marital Status:   Intimate Partner Violence:   . Fear of Current or Ex-Partner:   . Emotionally Abused:   Marland Kitchen Physically Abused:   . Sexually Abused:     FAMILY HISTORY: Family History  Problem Relation Age of Onset  . Prostate cancer Father 79       Stage 1  . Ovarian cancer Maternal Aunt 19       ? Germ cell?  . Prostate cancer Maternal Uncle 65       prostectomy  . Pancreatic cancer Maternal Grandmother 57  . Colon cancer Maternal Grandfather 9  . Heart attack Paternal Grandmother   . Prostate cancer Paternal Grandfather        dx in his 46s, d. 13s-90s  . Breast cancer Other        MGMs sister dx over 92  . Stomach cancer Neg Hx   . Esophageal cancer Neg Hx     Review of Systems  Constitutional: Negative for appetite change, chills, fatigue, fever and unexpected weight change.  HENT:   Negative for hearing loss, lump/mass, sore throat and trouble swallowing.   Eyes: Negative for eye problems and icterus.  Respiratory: Negative for chest tightness, cough and shortness of breath.   Cardiovascular: Negative for chest pain, leg swelling and palpitations.  Gastrointestinal: Negative for abdominal distention, abdominal pain, constipation, diarrhea, nausea and vomiting.  Endocrine: Positive for hot flashes.  Genitourinary: Negative for difficulty urinating.   Musculoskeletal: Negative for arthralgias.  Skin: Negative for itching and rash.  Neurological: Negative for dizziness, extremity weakness, headaches and  numbness.  Hematological: Negative for adenopathy. Does not bruise/bleed easily.  Psychiatric/Behavioral: Negative for depression. The patient is not nervous/anxious.       PHYSICAL EXAMINATION  ECOG PERFORMANCE STATUS: 1 - Symptomatic but completely ambulatory  Vitals:   01/08/20 1457  BP: 121/89  Pulse: 86  Resp: 18  Temp: 98.7 F (37.1 C)  SpO2: 100%    Physical Exam Constitutional:      General: She is not in acute distress.    Appearance: Normal appearance. She is not toxic-appearing.  HENT:     Head: Normocephalic and atraumatic.  Eyes:     General: No scleral icterus. Cardiovascular:     Rate and Rhythm: Normal rate and  regular rhythm.     Pulses: Normal pulses.     Heart sounds: Normal heart sounds.  Pulmonary:     Effort: Pulmonary effort is normal.     Breath sounds: Normal breath sounds.  Abdominal:     General: Abdomen is flat. Bowel sounds are normal.     Palpations: Abdomen is soft.  Musculoskeletal:     Cervical back: Neck supple.  Skin:    General: Skin is warm and dry.     Capillary Refill: Capillary refill takes less than 2 seconds.     Findings: No rash.  Neurological:     General: No focal deficit present.     Mental Status: She is alert.  Psychiatric:        Mood and Affect: Mood normal.        Behavior: Behavior normal.     LABORATORY DATA:  CBC    Component Value Date/Time   WBC 5.8 01/08/2020 1434   WBC 6.1 07/01/2019 1134   RBC 4.93 01/08/2020 1434   HGB 14.9 01/08/2020 1434   HCT 44.4 01/08/2020 1434   PLT 335 01/08/2020 1434   MCV 90.1 01/08/2020 1434   MCH 30.2 01/08/2020 1434   MCHC 33.6 01/08/2020 1434   RDW 12.4 01/08/2020 1434   LYMPHSABS 1.6 01/08/2020 1434   MONOABS 0.3 01/08/2020 1434   EOSABS 0.1 01/08/2020 1434   BASOSABS 0.0 01/08/2020 1434    CMP     Component Value Date/Time   NA 136 01/08/2020 1434   K 3.9 01/08/2020 1434   CL 101 01/08/2020 1434   CO2 24 01/08/2020 1434   GLUCOSE 82 01/08/2020  1434   BUN 10 01/08/2020 1434   CREATININE 0.73 01/08/2020 1434   CALCIUM 9.8 01/08/2020 1434   PROT 7.9 01/08/2020 1434   ALBUMIN 4.7 01/08/2020 1434   AST 18 01/08/2020 1434   ALT 21 01/08/2020 1434   ALKPHOS 88 01/08/2020 1434   BILITOT 0.6 01/08/2020 1434   GFRNONAA >60 01/08/2020 1434   GFRAA >60 01/08/2020 1434     ASSESSMENT and PLAN:   Malignant neoplasm of upper-outer quadrant of left breast in female, estrogen receptor positive ([place]) 01/02/2018: Palpable lump left breastsince December 2018, mammogram: Left breast UOQ 2.3 cm mass, axilla has several enlarged lymph nodes; 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  Status post neoadjuvant chemotherapy with dose dense Adriamycin Cytoxan x4 followed by Abraxane weekly x12  07/02/2018:Bilateral mastectomies: Left mastectomy: IDC grade 2, 0.9 cm, margins negative, 1/4 lymph nodes positive with extracapsular extension, margins negative ypT1b ypN1 ER 75% strong, PR 10% strong, HER-2 negative, Ki-67 60% 07/23/2018: Left axillary lymph node dissection: 0/4 LN Adjuvant radiation therapy 09/07/2018-10/23/2018 ----------------------------------------------------------------------------------------------------------------------------------- [name] clinical trial: Phase 3 clinical trial in patients or estrogen receptor positive randomized between standard antiestrogen therapy versus antiestrogen therapy with ribociclib 400 mg for 36 months. Discontinued after 3 cycles because of toxicities (nausea, metallic taste, numbness of the tongue) Adverse effects to ribociclib have resolved.  Shehadbilateral salpingo-oophorectomy on 03/06/2019. Breast reconstruction surgery 04/19/2019  Current treatment: Anastrozole Anastrozole toxicities: Hot flashes much improved after Effexorcurrently at 75 mg daily. Hot flashes have been up and  down. Chemo-induced peripheral neuropathy:Resolved  Surveillance: No role of imaging studies Breast exam every 6 months recommended.    She has no signs of breast cancer recurrence.  She will continue on anastrozole daily.  I recommended continued  healthy diet and exercise.    Return to clinic in 6 months for follow-up   No orders of the defined types were placed in this encounter.  Total encounter time: 20 minutes*  All questions were answered. The patient knows to call the clinic with any problems, questions or concerns. We can certainly see the patient much sooner if necessary.  Wilber Bihari, NP 01/10/20 8:04 PM Medical Oncology and Hematology [facility] [place], [place] [address] Tel. [phone]    Fax. [phone]   *Total Encounter Time as defined by the Centers for Medicare and Medicaid Services includes, in addition to the face-to-face time of a patient visit (documented in the note above) non-face-to-face time: obtaining and reviewing outside history, ordering and reviewing medications, tests or procedures, care coordination (communications with other health care professionals or caregivers) and documentation in the medical record.

## 2020-01-08 NOTE — Assessment & Plan Note (Addendum)
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  Status post neoadjuvant chemotherapy with dose dense Adriamycin Cytoxan x4 followed by Abraxane weekly x12  07/02/2018:Bilateral mastectomies: Left mastectomy: IDC grade 2, 0.9 cm, margins negative, 1/4 lymph nodes positive with extracapsular extension, margins negative ypT1b ypN1 ER 75% strong, PR 10% strong, HER-2 negative, Ki-67 60% 07/23/2018: Left axillary lymph node dissection: 0/4 LN Adjuvant radiation therapy 09/07/2018-10/23/2018 ----------------------------------------------------------------------------------------------------------------------------------- [name] clinical trial: Phase 3 clinical trial in patients or estrogen receptor positive randomized between standard antiestrogen therapy versus antiestrogen therapy with ribociclib 400 mg for 36 months. Discontinued after 3 cycles because of toxicities (nausea, metallic taste, numbness of the tongue) Adverse effects to ribociclib have resolved.  Shehadbilateral salpingo-oophorectomy on 03/06/2019. Breast reconstruction surgery 04/19/2019  Current treatment: Anastrozole Anastrozole toxicities: Hot flashes much improved after Effexorcurrently at 75 mg daily. Hot flashes have been up and down. Chemo-induced peripheral neuropathy:Resolved  Surveillance: No role of imaging studies Breast exam every 6 months recommended.    She has no signs of breast cancer recurrence.  She will continue on anastrozole daily.  I recommended continued healthy diet and exercise.    Return to clinic in 6 months for follow-up

## 2020-01-09 ENCOUNTER — Telehealth: Payer: Self-pay | Admitting: Adult Health

## 2020-01-09 ENCOUNTER — Telehealth: Payer: Self-pay | Admitting: *Deleted

## 2020-01-09 LAB — ESTRADIOL: Estradiol: <5 pg/mL

## 2020-01-09 LAB — FOLLICLE STIMULATING HORMONE: FSH: 95.4 m[IU]/mL

## 2020-01-09 NOTE — Telephone Encounter (Signed)
[name] Lab Results; LVM for patient informing of all lab results wnl and FSH/Estradiol confirming post menopausal status.  Asked patient to return call if any questions.  [name], BSN, RN Clinical Research Nurse 01/09/2020 2:53 PM

## 2020-01-09 NOTE — Telephone Encounter (Signed)
Scheduled appts per 6/16 los. Pt confirmed appt date and time.

## 2020-01-13 ENCOUNTER — Ambulatory Visit: Payer: Self-pay | Admitting: Hematology and Oncology

## 2020-01-13 ENCOUNTER — Other Ambulatory Visit: Payer: Self-pay

## 2020-01-14 ENCOUNTER — Other Ambulatory Visit: Payer: Self-pay

## 2020-01-14 ENCOUNTER — Ambulatory Visit: Payer: Self-pay | Admitting: Hematology and Oncology

## 2020-01-17 ENCOUNTER — Ambulatory Visit: Payer: Self-pay | Admitting: Hematology and Oncology

## 2020-03-25 ENCOUNTER — Other Ambulatory Visit: Payer: Self-pay | Admitting: *Deleted

## 2020-03-25 DIAGNOSIS — Z170 Estrogen receptor positive status [ER+]: Secondary | ICD-10-CM

## 2020-03-25 DIAGNOSIS — C50412 Malignant neoplasm of upper-outer quadrant of left female breast: Secondary | ICD-10-CM

## 2020-03-31 ENCOUNTER — Telehealth: Payer: Self-pay | Admitting: *Deleted

## 2020-03-31 NOTE — Telephone Encounter (Signed)
Patient reports a sore throat and cough which started yesterday 03/30/20. This morning she woke up with body aches and chills. She is unsure if she is having a fever since she does not have a working thermometer.  Patient states the first available Covid19 test she could schedule is tomorrow afternoon.  Patient needs to cancel her Lab and MD visit tomorrow until after she finds out the results of her covid19 testing and her symptoms improve.  Informed patient the window for this research visit extends through this Friday 04/03/20 and we will reschedule her visit to this Friday if possible.  If she is Covid19 positive or her symptoms are not improved by Friday then we will cancel the appointments again and research nurse will request guidance from the study regarding possible video visit.  Suggested patient contact her PCP regarding her symptoms.  Patient states she will let them know if she has a positive Covid19 test or if her symptoms worsen. Patient agreed to let research know about her results and how she is feeling after her test tomorrow afternoon.  Thanked patient for calling and expressed my hope for her feeling better soon.  [name], BSN, RN Clinical Research Nurse 03/31/2020 9:24 AM

## 2020-04-01 ENCOUNTER — Ambulatory Visit: Payer: 59 | Admitting: Hematology and Oncology

## 2020-04-01 ENCOUNTER — Other Ambulatory Visit: Payer: 59

## 2020-04-02 ENCOUNTER — Other Ambulatory Visit (HOSPITAL_COMMUNITY): Payer: Self-pay | Admitting: Adult Health

## 2020-04-02 ENCOUNTER — Ambulatory Visit (HOSPITAL_COMMUNITY)
Admission: RE | Admit: 2020-04-02 | Discharge: 2020-04-02 | Disposition: A | Discharge status: Home / Self Care | Payer: 59 | Source: Ambulatory Visit | Attending: Pulmonary Disease | Admitting: Pulmonary Disease

## 2020-04-02 ENCOUNTER — Telehealth: Payer: Self-pay | Admitting: *Deleted

## 2020-04-02 ENCOUNTER — Encounter: Payer: Self-pay | Admitting: Adult Health

## 2020-04-02 DIAGNOSIS — U071 COVID-19: Secondary | ICD-10-CM | POA: Insufficient documentation

## 2020-04-02 MED ORDER — SODIUM CHLORIDE 0.9 % IV SOLN
1200.0000 mg | Freq: Once | INTRAVENOUS | Status: AC
  Administered 2020-04-02: 1200 mg via INTRAVENOUS
  Filled 2020-04-02: qty 10

## 2020-04-02 MED ORDER — FAMOTIDINE IN NACL 20-0.9 MG/50ML-% IV SOLN: 20.0000 mg | Freq: Once | INTRAVENOUS | Status: DC | PRN

## 2020-04-02 MED ORDER — DIPHENHYDRAMINE HCL 50 MG/ML IJ SOLN: 50.0000 mg | Freq: Once | INTRAMUSCULAR | Status: DC | PRN

## 2020-04-02 MED ORDER — METHYLPREDNISOLONE SODIUM SUCC 125 MG IJ SOLR: 125.0000 mg | Freq: Once | INTRAMUSCULAR | Status: DC | PRN

## 2020-04-02 MED ORDER — EPINEPHRINE 0.3 MG/0.3ML IJ SOAJ: 0.3000 mg | Freq: Once | INTRAMUSCULAR | Status: DC | PRN

## 2020-04-02 MED ORDER — SODIUM CHLORIDE 0.9 % IV SOLN: INTRAVENOUS | Status: DC | PRN

## 2020-04-02 MED ORDER — ALBUTEROL SULFATE HFA 108 (90 BASE) MCG/ACT IN AERS: 2.0000 | INHALATION_SPRAY | Freq: Once | RESPIRATORY_TRACT | Status: DC | PRN

## 2020-04-02 NOTE — Progress Notes (Signed)
  Diagnosis: COVID-19  Physician:Dr Joya Gaskins  Procedure: Covid Infusion Clinic Med: casirivimab\imdevimab infusion - Provided patient with casirivimab\imdevimab fact sheet for patients, parents and caregivers prior to infusion.  Complications: No immediate complications noted.  Discharge: Discharged home   [place], [place] 04/02/2020

## 2020-04-02 NOTE — Discharge Instructions (Signed)

## 2020-04-02 NOTE — Telephone Encounter (Signed)
Patient left message that she did test positive for Covid19 yesterday at a local pharmacy.  She has contacted her PCP to see if she can get appt at the Covid infusion clinic.  Notified Dr. Lindi Adie of patient's [phone] results and her symptoms. He instructed for research nurse to also reach out to the Covid infusion clinic to help get patient scheduled.  Called patient and she reports she contacted her PCP, Dr. Santiago Glad Richter's office, and was told Dr. Darron Doom will authorize the infusion, but she hasn't heard anything back yet.  Informed patient I will also send a message to the clinic NP, Wilber Bihari, to see if she can help get patient scheduled quicker.  Patient states she appreciates that and she is feeling worse today than the past 2 days. Patient reports she was with her sister most of the weekend and her sister also tested positive for Covid19 along with her sister's husband.  Patient reports she has not received Covid19 vaccination.  Informed patient we will reschedule her study visit after she is feeling better and the appropriate quarantine time has passed.  She verbalized understanding.  [name], BSN, RN Clinical Research Nurse 04/02/2020 11:13 AM

## 2020-04-02 NOTE — Telephone Encounter (Signed)
Hey there,  I got her in at [facility] today.  Thank you!  Kristi [name]

## 2020-04-02 NOTE — Progress Notes (Signed)
I connected by phone with Kristi [name] on 04/02/2020 at 12:04 PM to discuss the potential use of a new treatment for mild to moderate COVID-19 viral infection in non-hospitalized patients.  This patient is a 44 y.o. female that meets the FDA criteria for Emergency Use Authorization of COVID monoclonal antibody casirivimab/imdevimab.  Has a (+) direct SARS-CoV-2 viral test result  Has mild or moderate COVID-19   Is NOT hospitalized due to COVID-19  Is within 10 days of symptom onset  Has at least one of the high risk factor(s) for progression to severe COVID-19 and/or hospitalization as defined in EUA.  Specific high risk criteria : Immunosuppressive Disease or Treatment   I have spoken and communicated the following to the patient or parent/caregiver regarding COVID monoclonal antibody treatment:  1. FDA has authorized the emergency use for the treatment of mild to moderate COVID-19 in adults and pediatric patients with positive results of direct SARS-CoV-2 viral testing who are 2 years of age and older weighing at least 40 kg, and who are at high risk for progressing to severe COVID-19 and/or hospitalization.  2. The significant known and potential risks and benefits of COVID monoclonal antibody, and the extent to which such potential risks and benefits are unknown.  3. Information on available alternative treatments and the risks and benefits of those alternatives, including clinical trials.  4. Patients treated with COVID monoclonal antibody should continue to self-isolate and use infection control measures (e.g., wear mask, isolate, social distance, avoid sharing personal items, clean and disinfect "high touch" surfaces, and frequent handwashing) according to CDC guidelines.   5. The patient or parent/caregiver has the option to accept or refuse COVID monoclonal antibody treatment.  After reviewing this information with the patient, The patient agreed to proceed with  receiving casirivimab\imdevimab infusion and will be provided a copy of the Fact sheet prior to receiving the infusion. Scot Dock 04/02/2020 12:04 PM

## 2020-04-06 ENCOUNTER — Telehealth: Payer: Self-pay | Admitting: *Deleted

## 2020-04-06 ENCOUNTER — Telehealth: Payer: Self-pay | Admitting: Hematology and Oncology

## 2020-04-06 NOTE — Telephone Encounter (Signed)
Upbeat Study Visit: Called patient to follow up on her symptoms and study visit to be rescheduled. Patient reports she is starting to feel better, but still feels very tired. She denies any fevers.  Informed patient our clinic guidelines are to wait at least 21 days from a positive Covid19 test and 3 days from any symptoms before a patient can have in clinic visit. We can reschedule study visit with lab and MD for 04/22/20.  Patient verbalized understanding and states the next day, 04/23/20, will work better. Reminded patient to continue to take Anastrozole daily and to record on a piece of paper or calendar to transcribe to the study provided diary on her next visit.  She verbalized understanding.  Encouraged patient to call if any questions or concerns before next contact. She agreed. Scheduling request sent for lab and MD on 04/23/20.  [name], BSN, RN Clinical Research Nurse 04/06/2020 3:01 PM

## 2020-04-06 NOTE — Telephone Encounter (Signed)
Scheduled appt per 9/13 sch msg- pt aware of appt date and time

## 2020-04-21 ENCOUNTER — Telehealth: Payer: Self-pay | Admitting: *Deleted

## 2020-04-21 NOTE — Telephone Encounter (Signed)
[name]; Called patient to confirm her study visit this Thursday 9/30 at 1:15 pm for Lab and MD visit afterward. Reminded patient to fast for at least 8 hrs prior to lab appointment.  Reminded patient to bring in medication diaries and Anastrozole to take in clinic.  Informed patient we will reschedule this visit if she is still having any active Covid19 symptoms.  Asked patient to return call to research nurse to confirm appointment and inform how she is feeling and if she is recovered from her recent illness. Tomorrow will be 3 weeks since positive Covid19 test.  [name], BSN, RN Clinical Research Nurse 04/21/2020 10:06 AM

## 2020-04-22 NOTE — Telephone Encounter (Signed)
Patient returned call to research nurse. She states she feels recovered from [place] symptoms. Patient confirmed her appointments for 04/23/20 and verbalized understanding to fast for at least 8 hours prior to lab, not take anastrozole at home but bring with to take in clinic, and bring medication diaries.  Thanked patient for returning the call and look forward to seeing her on 04/23/20.  [name], BSN, Cabin crew

## 2020-04-22 NOTE — Progress Notes (Signed)
Patient Care Team: [name], MD as PCP - General (Family Medicine) Nicholas Lose, MD as Consulting Physician (Hematology and Oncology) Rolm Bookbinder, MD as Consulting Physician (General Surgery) Delice Bison, [name], NP as Nurse Practitioner (Hematology and Oncology) Eppie Gibson, MD as Attending Physician (Radiation Oncology)  DIAGNOSIS:    ICD-10-CM   1. Malignant neoplasm of upper-outer quadrant of left breast in female, estrogen receptor positive ([place])  C50.412    Z17.0     SUMMARY OF ONCOLOGIC HISTORY: Oncology History  Malignant neoplasm of upper-outer quadrant of left breast in female, estrogen receptor positive ([place])  01/02/2018 Initial Diagnosis   Palpable lump left breast, mammogram: Left breast UOQ 2.3 cm mass, axilla has several enlarged lymph nodes; 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   01/04/2018 Cancer Staging   Staging form: Breast, AJCC 8th Edition - Clinical: Stage IIB (cT2, cN1, cM0, G3, ER+, PR+, HER2: Equivocal) - Signed by Nicholas Lose, MD on 01/04/2018   01/16/2018 - 06/18/2018 Neo-Adjuvant Chemotherapy   Dose dense Adriamycin and Cytoxan x4 followed by Taxol, switched to Abraxane with cycle 2 after Taxol reaction with palpitations weekly x12   01/19/2018 Genetic Testing   STK11 c.608C>T VUS identified on the Multicancer panel.  The Multi-Gene Panel offered by [facility] includes sequencing and/or deletion duplication testing of the following 83 genes: ALK, APC, ATM, AXIN2,BAP1,  BARD1, BLM, BMPR1A, BRCA1, BRCA2, BRIP1, CASR, CDC73, CDH1, CDK4, CDKN1B, CDKN1C, CDKN2A (p14ARF), CDKN2A (p16INK4a), CEBPA, CHEK2, CTNNA1, DICER1, DIS3L2, EGFR (c.2369C>T, p.Thr790Met variant only), EPCAM (Deletion/duplication testing only), FH, FLCN, GATA2, GPC3, GREM1 (Promoter region deletion/duplication testing only), HOXB13 (c.251G>A, p.Gly84Glu), HRAS, KIT,  MAX, MEN1, MET, MITF (c.952G>A, p.Glu318Lys variant only), MLH1, MSH2, MSH3, MSH6, MUTYH, NBN, NF1, NF2, NTHL1, PALB2, PDGFRA, PHOX2B, PMS2, POLD1, POLE, POT1, PRKAR1A, PTCH1, PTEN, RAD50, RAD51C, RAD51D, RB1, RECQL4, RET, RUNX1, SDHAF2, SDHA (sequence changes only), SDHB, SDHC, SDHD, SMAD4, SMARCA4, SMARCB1, SMARCE1, STK11, SUFU, TERT, TERT, TMEM127, TP53, TSC1, TSC2, VHL, WRN and WT1.  The report date is January 19, 2018.   06/13/2018 Breast MRI   Significant improvement of left breast cancer previously 2.4 x 3.2 x 4 cm, currently 1.4 x 1.3 x 0.8 cm, no lymph nodes   07/02/2018 Surgery   Bilateral mastectomies: Left mastectomy: IDC grade 2, 0.9 cm, margins negative, 1/4 lymph nodes positive with extracapsular extension, margins negative ypT1b ypN1 ER 75% strong, PR 70% strong, HER-2 negative, Ki-67 60%   07/24/2018 Surgery   Left axillary lymph node dissection: 0/4 lymph nodes   09/07/2018 - 10/22/2018 Radiation Therapy   Adjuvant radiation   10/2018 -  Anti-estrogen oral therapy   Zoladex and Anastrozole   02/06/2019 - 04/02/2019 Chemotherapy   The patient had [No matching medication found in this treatment plan]  for chemotherapy treatment.      CHIEF COMPLIANT: Follow-up of left breast cancer on [name] clinical trial  INTERVAL HISTORY: Kristi [name] is a 44 y.o. with above-mentioned history of breast cancer treated with neoadjuvant chemotherapy,bilateral mastectomies, radiation, and who is currently on adjuvant antiestrogen therapy with Zoladex and anastrozole. She is a participant in the [facility] clinical trial and was randomized to the ribociclib arm, but ribociclib was discontinued.She presents to the clinic todayfor follow-up. She had COVID-19 infection in September 2021.  She received monoclonal antibody therapy and has made a full recovery.  She is here for follow-up after having recovered from the  infection.  ALLERGIES:  is allergic to sulfa antibiotics, chlorhexidine  gluconate, and other.  MEDICATIONS:  Current Outpatient Medications  Medication Sig Dispense Refill  . anastrozole (ARIMIDEX) 1 MG tablet Take 1 tablet (1 mg total) by mouth daily. 90 tablet 3  . baclofen (LIORESAL) 10 MG tablet Take 10 mg by mouth 2 (two) times daily as needed (headaches/spasms.).     Mar[place] Kitchen Biotin 5000 MCG CAPS Take 1 capsule by mouth daily.    . Calcium Carbonate-Vitamin D (CALCIUM 500/VITAMIN D PO) Take 1 Dose by mouth daily. Calcium 500 mg with Vitamin D3 1,000 IU    . eletriptan (RELPAX) 40 MG tablet Take 40 mg by mouth every 2 (two) hours as needed for migraine (max 2 doses per 24 hrs.).   3  . EMGALITY 120 MG/ML SOAJ Inject 120 mg into the skin every 30 (thirty) days.     . fexofenadine (ALLEGRA) 180 MG tablet Take 180 mg by mouth daily as needed for allergies (Pt alternates with Xyzal).     . gabapentin (NEURONTIN) 300 MG capsule TAKE ONE CAPSULE BY MOUTH AT BEDTIME (Patient taking differently: Take 900 mg by mouth at bedtime. ) 30 capsule 2  . Omega 3 1200 MG CAPS Take 1 each by mouth daily.    . Rimegepant Sulfate (NURTEC) 75 MG TBDP Take 75 mg by mouth as needed (migraines).    . venlafaxine XR (EFFEXOR-XR) 75 MG 24 hr capsule Take 75 mg by mouth daily with breakfast.     No current facility-administered medications for this visit.   Facility-Administered Medications Ordered in Other Visits  Medication Dose Route Frequency Provider Last Rate Last Admin  . sodium chloride flush (NS) 0.9 % injection 10 mL  10 mL Intravenous PRN Nicholas Lose, MD   10 mL at 04/13/18 1529    PHYSICAL EXAMINATION: ECOG PERFORMANCE STATUS: 1 - Symptomatic but completely ambulatory  Vitals:   04/23/20 1355  BP: (!) 133/92  Pulse: 87  Resp: 17  Temp: 97.9 F (36.6 C)  SpO2: 100%   Filed Weights   04/23/20 1355  Weight: 145 lb 1.6 oz (65.8 kg)    BREAST: No palpable masses or nodules in either right or left breasts. No palpable axillary supraclavicular or infraclavicular  adenopathy no breast tenderness or nipple discharge. (exam performed in the presence of a chaperone)  LABORATORY DATA:  I have reviewed the data as listed CMP Latest Ref Rng & Units 04/23/2020 01/08/2020 10/16/2019  Glucose 70 - 99 mg/dL 79 82 80  BUN 6 - 20 mg/dL 9 10 9   Creatinine 0.44 - 1.00 mg/dL 0.68 0.73 0.75  Sodium 135 - 145 mmol/L 138 136 138  Potassium 3.5 - 5.1 mmol/L 3.8 3.9 4.0  Chloride 98 - 111 mmol/L 101 101 101  CO2 22 - 32 mmol/L 27 24 26   Calcium 8.9 - 10.3 mg/dL 9.7 9.8 9.8  Total Protein 6.5 - 8.1 g/dL 7.9 7.9 8.2(H)  Total Bilirubin 0.3 - 1.2 mg/dL 0.8 0.6 0.8  Alkaline Phos 38 - 126 U/L 96 88 106  AST 15 - 41 U/L 20 18 20   ALT 0 - 44 U/L 29 21 25     Lab Results  Component Value Date   WBC 6.0 04/23/2020   HGB 14.0 04/23/2020   HCT 41.7 04/23/2020   MCV 88.9 04/23/2020   PLT 298 04/23/2020   NEUTROABS 4.1 04/23/2020    ASSESSMENT & PLAN:  Malignant neoplasm of upper-outer quadrant of left breast in female, estrogen  receptor positive ([place]) 01/02/2018: Palpable lump left breastsince December 2018, mammogram: Left breast UOQ 2.3 cm mass, axilla has several enlarged lymph nodes; 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  Status post neoadjuvant chemotherapy with dose dense Adriamycin Cytoxan x4 followed by Abraxane weekly x12  07/02/2018:Bilateral mastectomies: Left mastectomy: IDC grade 2, 0.9 cm, margins negative, 1/4 lymph nodes positive with extracapsular extension, margins negative ypT1b ypN1 ER 75% strong, PR 10% strong, HER-2 negative, Ki-67 60% 07/23/2018: Left axillary lymph node dissection: 0/4 LN Adjuvant radiation therapy 09/07/2018-10/23/2018 ----------------------------------------------------------------------------------------------------------------------------------- [name] clinical trial: Phase 3 clinical trial in patients or estrogen  receptor positive randomized between standard antiestrogen therapy versus antiestrogen therapy with ribociclib 400 mg for 36 months. Discontinued after 3 cycles because of toxicities (nausea, metallic taste, numbness of the tongue) Adverse effects to ribociclib have resolved.  Shehadbilateral salpingo-oophorectomy on 03/06/2019. Breast reconstruction surgery 04/19/2019  Current treatment: Anastrozole Anastrozole toxicities: Hot flashes much improved after Effexorcurrently at 75 mg daily. Hot flashes are better Chemo-induced peripheral neuropathy:Resolved  COVID-19 infection September 2021: Treated with monoclonal antibody therapy I encouraged her to take the COVID-19 vaccine in 3 months.  Surveillance: No role of imaging studies Breast exam 04/23/2020: Benign  No clinical evidence of breast cancer. Return to clinic in December for follow-up.  No orders of the defined types were placed in this encounter.  The patient has a good understanding of the overall plan. she agrees with it. she will call with any problems that may develop before the next visit here.  Total time spent: 20 mins including face to face time and time spent for planning, charting and coordination of care  Nicholas Lose, MD 04/23/2020  I, Cloyde Reams Dorshimer, am acting as scribe for Dr. Nicholas Lose.  I have reviewed the above documentation for accuracy and completeness, and I agree with the above.

## 2020-04-23 ENCOUNTER — Other Ambulatory Visit: Payer: Self-pay

## 2020-04-23 ENCOUNTER — Inpatient Hospital Stay: Payer: 59

## 2020-04-23 ENCOUNTER — Inpatient Hospital Stay: Payer: 59 | Attending: Hematology and Oncology | Admitting: Hematology and Oncology

## 2020-04-23 ENCOUNTER — Encounter: Payer: Self-pay | Admitting: *Deleted

## 2020-04-23 DIAGNOSIS — Z170 Estrogen receptor positive status [ER+]: Secondary | ICD-10-CM

## 2020-04-23 DIAGNOSIS — Z006 Encounter for examination for normal comparison and control in clinical research program: Secondary | ICD-10-CM

## 2020-04-23 DIAGNOSIS — C50412 Malignant neoplasm of upper-outer quadrant of left female breast: Secondary | ICD-10-CM

## 2020-04-23 LAB — CMP (CANCER CENTER ONLY)
ALT: 29 U/L (ref 0–44)
AST: 20 U/L (ref 15–41)
Albumin: 4.5 g/dL (ref 3.5–5.0)
Alkaline Phosphatase: 96 U/L (ref 38–126)
Anion gap: 10 (ref 5–15)
BUN: 9 mg/dL (ref 6–20)
CO2: 27 mmol/L (ref 22–32)
Calcium: 9.7 mg/dL (ref 8.9–10.3)
Chloride: 101 mmol/L (ref 98–111)
Creatinine: 0.68 mg/dL (ref 0.44–1.00)
GFR, Est AFR Am: >60 mL/min (ref >60)
GFR, Estimated: >60 mL/min (ref >60)
Glucose, Bld: 79 mg/dL (ref 70–99)
Potassium: 3.8 mmol/L (ref 3.5–5.1)
Sodium: 138 mmol/L (ref 135–145)
Total Bilirubin: 0.8 mg/dL (ref 0.3–1.2)
Total Protein: 7.9 g/dL (ref 6.5–8.1)

## 2020-04-23 LAB — CBC WITH DIFFERENTIAL (CANCER CENTER ONLY)
Abs Immature Granulocytes: 0.01 10*3/uL (ref 0.00–0.07)
Basophils Absolute: 0 10*3/uL (ref 0.0–0.1)
Basophils Relative: 1 %
Eosinophils Absolute: 0 10*3/uL (ref 0.0–0.5)
Eosinophils Relative: 1 %
HCT: 41.7 % (ref 36.0–46.0)
Hemoglobin: 14 g/dL (ref 12.0–15.0)
Immature Granulocytes: 0 %
Lymphocytes Relative: 26 %
Lymphs Abs: 1.6 10*3/uL (ref 0.7–4.0)
MCH: 29.9 pg (ref 26.0–34.0)
MCHC: 33.6 g/dL (ref 30.0–36.0)
MCV: 88.9 fL (ref 80.0–100.0)
Monocytes Absolute: 0.3 10*3/uL (ref 0.1–1.0)
Monocytes Relative: 6 %
Neutro Abs: 4.1 10*3/uL (ref 1.7–7.7)
Neutrophils Relative %: 66 %
Platelet Count: 298 10*3/uL (ref 150–400)
RBC: 4.69 MIL/uL (ref 3.87–5.11)
RDW: 12.4 % (ref 11.5–15.5)
WBC Count: 6 10*3/uL (ref 4.0–10.5)
nRBC: 0 % (ref 0.0–0.2)

## 2020-04-23 LAB — LACTATE DEHYDROGENASE: LDH: 131 U/L (ref 98–192)

## 2020-04-23 LAB — URIC ACID: Uric Acid, Serum: 4.8 mg/dL (ref 2.5–7.1)

## 2020-04-23 LAB — AMYLASE: Amylase: 82 U/L (ref 28–100)

## 2020-04-23 LAB — LIPASE, BLOOD: Lipase: 52 U/L — ABNORMAL HIGH (ref 11–51)

## 2020-04-23 LAB — GAMMA GT: GGT: 17 U/L (ref 7–50)

## 2020-04-23 LAB — PHOSPHORUS: Phosphorus: 4 mg/dL (ref 2.5–4.6)

## 2020-04-23 LAB — BILIRUBIN, DIRECT: Bilirubin, Direct: 0.1 mg/dL (ref 0.0–0.2)

## 2020-04-23 LAB — MAGNESIUM: Magnesium: 2.1 mg/dL (ref 1.7–2.4)

## 2020-04-23 MED ORDER — VENLAFAXINE HCL ER 75 MG PO CP24: 75.0000 mg | ORAL_CAPSULE | Freq: Every day | ORAL | 3 refills | Status: DC

## 2020-04-23 NOTE — Research (Addendum)
Cycle 16, Day 23 [name] Visit: Patient into clinic unaccompanied today to complete the study activities for cycle 16. This visit was delayed by 22 days due to patient's Covid19 illness and clinic policy to wait at least 21 days from a positive Covid19 test before in clinic visits allowed.  PROs: Given to patient prior to lab appointment.  Collected and checked for completeness and accuracy.  Labs: Fasting labs were collected per protocol. Patient confirmed she has been fasting at least 8 hours. Results reviewed by Dr. Lindi Adie.  Elevated lipase is considered not clinically significant by Dr. Lindi Adie. Corrected Calcium Formula:  4 g/dL utilized for "normal albumin;" local normal range is 3.5-5 g/dL. Corrected Calcium = (0.8 x [normal albumin - patient's albumin]) + serum calcium Correct Calcium = (0.8 x [4-4.5]) + 9.7 Corrected Calcium = 9.3 VS: Completed per protocol with BP obtained after patient has been resting x 5 minutes.See VS flow sheet. MD: Patient was seen by Dr. Lindi Adie. See his officeeEncounter from today. Concomitant Meds:  Patient reports she started taking acetaminophen, aspirin and mucinex for Covid19 symptoms. She stopped taking them when she started feeling better. Medication list updated. No other changes to medications. Adverse Events:  Patient tested positive for Covid19 on 04/01/20.  Symptoms started on 03/30/20 and included sore throat, cough, congestion, body aches, headache, loss of smell and taste, low grade fevers and fatigue.  Patient received infusion of monoclonal antibody on 04/02/20 at [facility]. Patient was not hospitalized and states her symptoms resolved by 04/13/20.  Patient denies any other new or worsening AEs since last study visit.   See AE table below.  Endocrine Therapy: Cycles 13, 14 and 15 medication diaries collected. Patient reports 100% compliance with ET intake and diaries reflect this. New medication diaries for cycles 16, 17 and 18 given to patient  and reminded on completing daily and returning both pages of diaries on her next visit. Patient has been recording her anastozole intake for this cycle 16 on a plain sheet of paper since she did not have a new diary to mark. Instructed patient to transcribe the information to study diary provided today. Patient verbalized understanding. She took Anastrozole in clinic after all study assessments were completed and documented in medication diary at 1420.  Plan: Patient scheduled to return her Cycle 19 visit on 06/24/20. Thanked patient for her ongoing participation in this study and encouraged her to call for any new questions or concerns prior to next visit. She verbalized understanding.  AEs: Event Grade Onset Date End Date Attribution to Anastrozole Attributionto Ribociclib Status Comments  Allergic Rhinitis 2 2005  No No Ongoing; baseline   Headache 1 2000  No No Ongoing; basline   Hot Flashes 2 12/2017 07/26/19 Yes No baseline Intermittent  Hot Flashes 1 07/26/19    Ongoing; baseline Decreased to grade 1  Memory Impairment 1 02/2019  No No Ongoing; baseline   Discolored Tooth 1 04/26/19  No No Ongoing   Gum Inflammation 1 06/24/19  No No Ongoing   Alopecia 1 11/08/19  Yes No Ongoing Biotin, Omega 3  Heat Intolerance 1 12/24/19  Yes No Ongoing Recurred  Covid19 Infection 2 03/30/20 04/13/20 No No Resolved Monoclonal Antibody Infusion.   Sore Throat* 2 03/30/20 04/13/20 No No Resolved Acetaminophen  Cough* 2 03/30/20 04/13/20 No No Resolved Mucinex  Body Aches* 2 03/30/20 04/13/20 No No Resolved Acetaminophen, Aspirin  Chills* 2 03/30/20 04/13/20 No No Resolved Acetaminophen, Aspirin  Nasal Congestion* 2 03/30/20 04/13/20  No No Resolved Mucinex  Headache* 2 03/30/20 04/13/20 No No Resolved Acetaminophen, Aspirin  Loss of Taste* 2 03/30/20 04/13/20 No No Resolved   Loss of Smell* 2 03/30/20 04/13/20 No No Resolved   Fatigue* 2 03/30/20 04/13/20 No No Resolved   Low Grade* fevers 2 03/30/20 04/13/20 No No  Resolved   *Symptoms of Covid19 confirmed infection   Kristi [name], BSN, RN Clinical Research Nurse 04/23/2020

## 2020-04-23 NOTE — Assessment & Plan Note (Signed)
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  Status post neoadjuvant chemotherapy with dose dense Adriamycin Cytoxan x4 followed by Abraxane weekly x12  07/02/2018:Bilateral mastectomies: Left mastectomy: IDC grade 2, 0.9 cm, margins negative, 1/4 lymph nodes positive with extracapsular extension, margins negative ypT1b ypN1 ER 75% strong, PR 10% strong, HER-2 negative, Ki-67 60% 07/23/2018: Left axillary lymph node dissection: 0/4 LN Adjuvant radiation therapy 09/07/2018-10/23/2018 ----------------------------------------------------------------------------------------------------------------------------------- [name] clinical trial: Phase 3 clinical trial in patients or estrogen receptor positive randomized between standard antiestrogen therapy versus antiestrogen therapy with ribociclib 400 mg for 36 months. Discontinued after 3 cycles because of toxicities (nausea, metallic taste, numbness of the tongue) Adverse effects to ribociclib have resolved.  Shehadbilateral salpingo-oophorectomy on 03/06/2019. Breast reconstruction surgery 04/19/2019  Current treatment: Anastrozole Anastrozole toxicities: Hot flashes much improved after Effexorcurrently at 75 mg daily. Hot flashes have been up and down. Chemo-induced peripheral neuropathy:Resolved  COVID-19 infection: Treated with monoclonal antibody therapy  Surveillance: No role of imaging studies Breast exam 04/23/2020: Benign

## 2020-04-24 ENCOUNTER — Telehealth: Payer: Self-pay | Admitting: Hematology and Oncology

## 2020-04-24 NOTE — Telephone Encounter (Signed)
Scheduled appts per 9/30 los. Pt confirmed appt date and time.

## 2020-06-16 ENCOUNTER — Other Ambulatory Visit: Payer: Self-pay | Admitting: *Deleted

## 2020-06-16 DIAGNOSIS — C50412 Malignant neoplasm of upper-outer quadrant of left female breast: Secondary | ICD-10-CM

## 2020-06-16 DIAGNOSIS — Z170 Estrogen receptor positive status [ER+]: Secondary | ICD-10-CM

## 2020-06-24 ENCOUNTER — Telehealth: Payer: Self-pay | Admitting: *Deleted

## 2020-06-24 NOTE — Assessment & Plan Note (Signed)
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  Status post neoadjuvant chemotherapy with dose dense Adriamycin Cytoxan x4 followed by Abraxane weekly x12  07/02/2018:Bilateral mastectomies: Left mastectomy: IDC grade 2, 0.9 cm, margins negative, 1/4 lymph nodes positive with extracapsular extension, margins negative ypT1b ypN1 ER 75% strong, PR 10% strong, HER-2 negative, Ki-67 60% 07/23/2018: Left axillary lymph node dissection: 0/4 LN Adjuvant radiation therapy 09/07/2018-10/23/2018 ----------------------------------------------------------------------------------------------------------------------------------- [name] clinical trial: Phase 3 clinical trial in patients or estrogen receptor positive randomized between standard antiestrogen therapy versus antiestrogen therapy with ribociclib 400 mg for 36 months. Discontinued after 3 cycles because of toxicities (nausea, metallic taste, numbness of the tongue) Adverse effects to ribociclib have resolved.  Shehadbilateral salpingo-oophorectomy on 03/06/2019. Breast reconstruction surgery 04/19/2019  Current treatment: Anastrozole Anastrozole toxicities: Hot flashes much improved after Effexorcurrently at 75 mg daily.  Chemo-induced peripheral neuropathy:Resolved  COVID-19 infection September 2021: Treated with monoclonal antibody therapy  Surveillance: No role of imaging studies Breast exam 04/23/2020: Benign  No clinical evidence of breast cancer. Return to clinic in December for follow-up.

## 2020-06-24 NOTE — Telephone Encounter (Signed)
Kristi [name]; Called patient and left VM to remind her of [name] visit for tomorrow morning at 7:45 am. Reminded patient to fast for at least 8 hrs prior to lab appointment.  Reminded patient to bring in medication diaries and Anastrozole to take in clinic.  Instructed patient to complete today's dose of anastrozole on current diary since new cycle starts today. Asked patient to return call to research nurse if she cannot make appointment or has any questions.  [name], BSN, RN Clinical Research Nurse 06/24/2020 11:36 AM

## 2020-06-24 NOTE — Progress Notes (Signed)
Patient Care Team: [name], MD as PCP - General (Family Medicine) Nicholas Lose, MD as Consulting Physician (Hematology and Oncology) Rolm Bookbinder, MD as Consulting Physician (General Surgery) Delice Bison, [name], NP as Nurse Practitioner (Hematology and Oncology) Eppie Gibson, MD as Attending Physician (Radiation Oncology)  DIAGNOSIS:    ICD-10-CM   1. Malignant neoplasm of upper-outer quadrant of left breast in female, estrogen receptor positive ([place])  C50.412    Z17.0     SUMMARY OF ONCOLOGIC HISTORY: Oncology History  Malignant neoplasm of upper-outer quadrant of left breast in female, estrogen receptor positive ([place])  01/02/2018 Initial Diagnosis   Palpable lump left breast, mammogram: Left breast UOQ 2.3 cm mass, axilla has several enlarged lymph nodes; 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   01/04/2018 Cancer Staging   Staging form: Breast, AJCC 8th Edition - Clinical: Stage IIB (cT2, cN1, cM0, G3, ER+, PR+, HER2: Equivocal) - Signed by Nicholas Lose, MD on 01/04/2018   01/16/2018 - 06/18/2018 Neo-Adjuvant Chemotherapy   Dose dense Adriamycin and Cytoxan x4 followed by Taxol, switched to Abraxane with cycle 2 after Taxol reaction with palpitations weekly x12   01/19/2018 Genetic Testing   STK11 c.608C>T VUS identified on the Multicancer panel.  The Multi-Gene Panel offered by [facility] includes sequencing and/or deletion duplication testing of the following 83 genes: ALK, APC, ATM, AXIN2,BAP1,  BARD1, BLM, BMPR1A, BRCA1, BRCA2, BRIP1, CASR, CDC73, CDH1, CDK4, CDKN1B, CDKN1C, CDKN2A (p14ARF), CDKN2A (p16INK4a), CEBPA, CHEK2, CTNNA1, DICER1, DIS3L2, EGFR (c.2369C>T, p.Thr790Met variant only), EPCAM (Deletion/duplication testing only), FH, FLCN, GATA2, GPC3, GREM1 (Promoter region deletion/duplication testing only), HOXB13 (c.251G>A, p.Gly84Glu), HRAS, KIT,  MAX, MEN1, MET, MITF (c.952G>A, p.Glu318Lys variant only), MLH1, MSH2, MSH3, MSH6, MUTYH, NBN, NF1, NF2, NTHL1, PALB2, PDGFRA, PHOX2B, PMS2, POLD1, POLE, POT1, PRKAR1A, PTCH1, PTEN, RAD50, RAD51C, RAD51D, RB1, RECQL4, RET, RUNX1, SDHAF2, SDHA (sequence changes only), SDHB, SDHC, SDHD, SMAD4, SMARCA4, SMARCB1, SMARCE1, STK11, SUFU, TERT, TERT, TMEM127, TP53, TSC1, TSC2, VHL, WRN and WT1.  The report date is January 19, 2018.   06/13/2018 Breast MRI   Significant improvement of left breast cancer previously 2.4 x 3.2 x 4 cm, currently 1.4 x 1.3 x 0.8 cm, no lymph nodes   07/02/2018 Surgery   Bilateral mastectomies: Left mastectomy: IDC grade 2, 0.9 cm, margins negative, 1/4 lymph nodes positive with extracapsular extension, margins negative ypT1b ypN1 ER 75% strong, PR 70% strong, HER-2 negative, Ki-67 60%   07/24/2018 Surgery   Left axillary lymph node dissection: 0/4 lymph nodes   09/07/2018 - 10/22/2018 Radiation Therapy   Adjuvant radiation   10/2018 -  Anti-estrogen oral therapy   Zoladex and Anastrozole   02/06/2019 - 03/06/2019 Chemotherapy   The patient had Investigational ribociclib (KISQALI) 200 MG tablet [name], 400 mg, Oral, Daily, 2 of 16 cycles Dose modification: 200 mg (original dose 400 mg, Cycle 3, Reason: Provider Judgment, Comment: Dose Reduction to 200 mg)  for chemotherapy treatment.      CHIEF COMPLIANT: Follow-up of left breast cancer on [name] clinical trial  INTERVAL HISTORY: Kristi [name] is a 44 y.o. with above-mentioned history of breast cancer treated with neoadjuvant chemotherapy,bilateral mastectomies, radiation, and who is currently on adjuvant antiestrogen therapy with Zoladex and anastrozole. She is a participant in the [place] clinical trial.She presents to the clinic todayfor follow-up. Her major complaints today are some lingering side effects from Covid infection which are  intermittent loss of taste and smell and some dullness in the head.   She is also very stressed at work.  She continues to have hot flashes 4-5 times through the day and several at night.  She is on gabapentin and Effexor.  These wake her up and she has hard time falling back to sleep.  Because of this she feels groggy through the day.  Neuropathy is also intermittent mostly in her feet.  ALLERGIES:  is allergic to sulfa antibiotics, chlorhexidine gluconate, and other.  MEDICATIONS:  Current Outpatient Medications  Medication Sig Dispense Refill  . anastrozole (ARIMIDEX) 1 MG tablet Take 1 tablet (1 mg total) by mouth daily. 90 tablet 3  . baclofen (LIORESAL) 10 MG tablet Take 10 mg by mouth 2 (two) times daily as needed (headaches/spasms.).     Marland Kitchen Biotin 5000 MCG CAPS Take 1 capsule by mouth daily.    . Calcium Carbonate-Vitamin D (CALCIUM 500/VITAMIN D PO) Take 1 Dose by mouth daily. Calcium 500 mg with Vitamin D3 1,000 IU    . eletriptan (RELPAX) 40 MG tablet Take 40 mg by mouth every 2 (two) hours as needed for migraine (max 2 doses per 24 hrs.).   3  . EMGALITY 120 MG/ML SOAJ Inject 120 mg into the skin every 30 (thirty) days.     . fexofenadine (ALLEGRA) 180 MG tablet Take 180 mg by mouth daily as needed for allergies (Pt alternates with Xyzal).     . gabapentin (NEURONTIN) 300 MG capsule TAKE ONE CAPSULE BY MOUTH AT BEDTIME (Patient taking differently: Take 900 mg by mouth at bedtime. ) 30 capsule 2  . Omega 3 1200 MG CAPS Take 1 each by mouth daily.    . Rimegepant Sulfate (NURTEC) 75 MG TBDP Take 75 mg by mouth as needed (migraines).    . venlafaxine XR (EFFEXOR-XR) 75 MG 24 hr capsule Take 1 capsule (75 mg total) by mouth daily with breakfast. 90 capsule 3   No current facility-administered medications for this visit.   Facility-Administered Medications Ordered in Other Visits  Medication Dose Route Frequency Provider Last Rate Last Admin  . sodium chloride flush (NS) 0.9 % injection 10 mL  10 mL Intravenous PRN Nicholas Lose, MD   10 mL at 04/13/18  1529    PHYSICAL EXAMINATION: ECOG PERFORMANCE STATUS: 1 - Symptomatic but completely ambulatory  Vitals:   06/25/20 0822  BP: 130/90  Pulse: 99  Resp: 20  Temp: 98.9 F (37.2 C)  SpO2: 100%   Filed Weights   06/25/20 0822  Weight: 147 lb 8 oz (66.9 kg)    BREAST: No palpable masses or nodules in either right or left breasts. No palpable axillary supraclavicular or infraclavicular adenopathy no breast tenderness or nipple discharge. (exam performed in the presence of a chaperone)  LABORATORY DATA:  I have reviewed the data as listed CMP Latest Ref Rng & Units 06/25/2020 04/23/2020 01/08/2020  Glucose 70 - 99 mg/dL 91 79 82  BUN 6 - 20 mg/dL _0 Creatinine 0.44 - 1.00 mg/dL 0.75 0.68 0.73  Sodium 135 - 145 mmol/L 136 138 136  Potassium 3.5 - 5.1 mmol/L 4.3 3.8 3.9  Chloride 98 - 111 mmol/L 101 101 101  CO2 22 - 32 mmol/L _1 Calcium 8.9 - 10.3 mg/dL 9.5 9.7 9.8  Total Protein 6.5 - 8.1 g/dL 7.8 7.9 7.9  Total Bilirubin 0.3 - 1.2 mg/dL 0.4 0.8 0.6  Alkaline Phos 38 - 126 U/L  82 96 88  AST 15 - 41 U/L _0 ALT 0 - 44 U/L 37 29 21    Lab Results  Component Value Date   WBC 5.0 06/25/2020   HGB 13.7 06/25/2020   HCT 42.2 06/25/2020   MCV 90.9 06/25/2020   PLT 318 06/25/2020   NEUTROABS 2.9 06/25/2020    ASSESSMENT & PLAN:  Malignant neoplasm of upper-outer quadrant of left breast in female, estrogen receptor positive ([place]) 01/02/2018: Palpable lump left breastsince December 2018, mammogram: Left breast UOQ 2.3 cm mass, axilla has several enlarged lymph nodes; 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  Status post neoadjuvant chemotherapy with dose dense Adriamycin Cytoxan x4 followed by Abraxane weekly x12  07/02/2018:Bilateral mastectomies: Left mastectomy: IDC grade 2, 0.9 cm, margins negative, 1/4 lymph nodes positive with extracapsular extension,  margins negative ypT1b ypN1 ER 75% strong, PR 10% strong, HER-2 negative, Ki-67 60% 07/23/2018: Left axillary lymph node dissection: 0/4 LN Adjuvant radiation therapy 09/07/2018-10/23/2018 ----------------------------------------------------------------------------------------------------------------------------------- [name] clinical trial: Phase 3 clinical trial in patients or estrogen receptor positive randomized between standard antiestrogen therapy versus antiestrogen therapy with ribociclib 400 mg for 36 months. Discontinued after 3 cycles because of toxicities (nausea, metallic taste, numbness of the tongue) Adverse effects to ribociclib have resolved.  Shehadbilateral salpingo-oophorectomy on 03/06/2019. Breast reconstruction surgery 04/19/2019  Current treatment: Anastrozole Anastrozole toxicities:  1. Hot flashes much improved after Effexorcurrently at 75 mg daily.  However she still gets 4-5 hot flashes through the day.  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 Return to clinic in February 2022 for follow-up.    No orders of the defined types were placed in this encounter.  The patient has a good understanding of the overall plan. she agrees with it. she will call with any problems that may develop before the next visit here.  Total time spent: 20 mins including face to face time and time spent for planning, charting and coordination of care  Nicholas Lose, MD 06/25/2020  I, Cloyde Reams Dorshimer, am acting as scribe for Dr. Nicholas Lose.  I have reviewed the above documentation for accuracy and completeness, and I agree  with the above.

## 2020-06-25 ENCOUNTER — Inpatient Hospital Stay: Payer: 59 | Attending: Hematology and Oncology | Admitting: Hematology and Oncology

## 2020-06-25 ENCOUNTER — Encounter: Payer: Self-pay | Admitting: *Deleted

## 2020-06-25 ENCOUNTER — Inpatient Hospital Stay: Payer: 59

## 2020-06-25 ENCOUNTER — Other Ambulatory Visit: Payer: Self-pay

## 2020-06-25 DIAGNOSIS — Z170 Estrogen receptor positive status [ER+]: Secondary | ICD-10-CM

## 2020-06-25 DIAGNOSIS — C50412 Malignant neoplasm of upper-outer quadrant of left female breast: Secondary | ICD-10-CM

## 2020-06-25 DIAGNOSIS — Z006 Encounter for examination for normal comparison and control in clinical research program: Secondary | ICD-10-CM

## 2020-06-25 LAB — CMP (CANCER CENTER ONLY)
ALT: 37 U/L (ref 0–44)
AST: 28 U/L (ref 15–41)
Albumin: 4.5 g/dL (ref 3.5–5.0)
Alkaline Phosphatase: 82 U/L (ref 38–126)
Anion gap: 11 (ref 5–15)
BUN: 16 mg/dL (ref 6–20)
CO2: 24 mmol/L (ref 22–32)
Calcium: 9.5 mg/dL (ref 8.9–10.3)
Chloride: 101 mmol/L (ref 98–111)
Creatinine: 0.75 mg/dL (ref 0.44–1.00)
GFR, Estimated: >60 mL/min (ref >60)
Glucose, Bld: 91 mg/dL (ref 70–99)
Potassium: 4.3 mmol/L (ref 3.5–5.1)
Sodium: 136 mmol/L (ref 135–145)
Total Bilirubin: 0.4 mg/dL (ref 0.3–1.2)
Total Protein: 7.8 g/dL (ref 6.5–8.1)

## 2020-06-25 LAB — CBC WITH DIFFERENTIAL (CANCER CENTER ONLY)
Abs Immature Granulocytes: 0 10*3/uL (ref 0.00–0.07)
Basophils Absolute: 0 10*3/uL (ref 0.0–0.1)
Basophils Relative: 1 %
Eosinophils Absolute: 0.1 10*3/uL (ref 0.0–0.5)
Eosinophils Relative: 1 %
HCT: 42.2 % (ref 36.0–46.0)
Hemoglobin: 13.7 g/dL (ref 12.0–15.0)
Immature Granulocytes: 0 %
Lymphocytes Relative: 33 %
Lymphs Abs: 1.7 10*3/uL (ref 0.7–4.0)
MCH: 29.5 pg (ref 26.0–34.0)
MCHC: 32.5 g/dL (ref 30.0–36.0)
MCV: 90.9 fL (ref 80.0–100.0)
Monocytes Absolute: 0.3 10*3/uL (ref 0.1–1.0)
Monocytes Relative: 7 %
Neutro Abs: 2.9 10*3/uL (ref 1.7–7.7)
Neutrophils Relative %: 58 %
Platelet Count: 318 10*3/uL (ref 150–400)
RBC: 4.64 MIL/uL (ref 3.87–5.11)
RDW: 12.8 % (ref 11.5–15.5)
WBC Count: 5 10*3/uL (ref 4.0–10.5)
nRBC: 0 % (ref 0.0–0.2)

## 2020-06-25 LAB — PHOSPHORUS: Phosphorus: 3.9 mg/dL (ref 2.5–4.6)

## 2020-06-25 LAB — LIPASE, BLOOD: Lipase: 53 U/L — ABNORMAL HIGH (ref 11–51)

## 2020-06-25 LAB — LACTATE DEHYDROGENASE: LDH: 145 U/L (ref 98–192)

## 2020-06-25 LAB — BILIRUBIN, DIRECT: Bilirubin, Direct: 0.1 mg/dL (ref 0.0–0.2)

## 2020-06-25 LAB — GAMMA GT: GGT: 15 U/L (ref 7–50)

## 2020-06-25 LAB — URIC ACID: Uric Acid, Serum: 5.6 mg/dL (ref 2.5–7.1)

## 2020-06-25 LAB — AMYLASE: Amylase: 77 U/L (ref 28–100)

## 2020-06-25 LAB — MAGNESIUM: Magnesium: 2.3 mg/dL (ref 1.7–2.4)

## 2020-06-25 NOTE — Research (Signed)
Cycle 19, Day 2 [name] Visit: Patient into clinic unaccompanied today to complete the study activities for cycle 19. This visit was delayed by 1 day due to logistical scheduling issues but still within study window for this visit.   PROs: Given to patient prior to lab appointment.  Collected and checked for completeness and accuracy.  Labs: Fasting labs were collected per protocol. Patient confirmed she has been fasting at least 8 hours. Results reviewed by Dr. Lindi Adie.                    Elevated lipase is considered not clinically significant by Dr. Lindi Adie. Corrected Calcium Formula:  4 g/dL utilized for "normal albumin;" local normal range is 3.5-5 g/dL. Corrected Calcium = (0.8 x [normal albumin - patient's albumin]) + serum calcium Correct Calcium = (0.8 x [4-4.5]) + 9.5 Corrected Calcium = 9.1 VS: Completed per protocol with BP obtained after patient has been resting x 5 minutes.See VS flow sheet. MD: Patient was seen by Dr. Lindi Adie. See his office encounter note from today. Concomitant Meds: Medication list reviewed with patient and it is up to date. Patient denies any new medications or discontinuing any medications since last study visit.  Adverse Events:  Loss of taste and Smell intermittent-grade 1- Patient states she has noticed intermittent loss of taste and smell since last study visit. She thought it had resolved on last visit, but she continues to experience it occasionally since recovering from the acute symptoms of Covid19.  Fatigue- grade 1- Pt reports feeling tired and fatigued since last study visit. She is not sure if it is due to insomnia or stress or recent Covid19 infection. She is able to work and perform her normal daily activities, but feels too tired to do anything extra such as exercise.  Insomnia- grade 1- Reports difficulty staying asleep at night since last visit.  Hot Flashes- grade 2- Reports worsening hot flashes since last visit.  Decreased concentration- grade 1-  Pt reports difficulty concentrating at work since last visit. She is not sure if this is due to stress as she reports a lot of work related stress lately.  Patient reports no changes to dental issues, alopecia, Patient denies any other new or worsening AEs since last study visit.   Hypertension-grade 2-Ongoing intermittent grade 2 HTN is considered clinically insignificant per Dr. Lindi Adie. Patient had grade 1 HTN reported prior to starting on study.  See AE table below.  Endocrine Therapy: Cycles 16, 17 and 18 medication diaries collected. Patient reports 100% compliance with ET intake and diaries reflect this. Yesterday's dose of anastrozole was recorded on cycle 18 diary as directed since patient was not coming into clinic to get new diaries until day 2 of cycle 19 (today).  New medication diaries for cycles 19, 20 and 21 given to patient and reminded on completing daily and returning both pages of diaries on her next visit. Patient verbalized understanding. She took Anastrozole in clinic after all study assessments were completed and documented in medication diary at 8:37am.  Plan: Patient scheduled to return for Cycle 22 visit on 09/16/2020. Thanked patient for her ongoing participation in this study and encouraged her to call for any new questions or concerns prior to next visit. She verbalized understanding.  AEs: Event Grade Onset Date End Date Attribution to Anastrozole Attributionto Ribociclib Status Comments  Allergic Rhinitis 2 2005  No No Ongoing; baseline   Headache 1 2000  No No Ongoing; basline   Hot Flashes  1 07/26/19 04/24/20   Ongoing; baseline Decreased to grade 1 (baseline)  Memory Impairment 1 02/2019  No No Ongoing; baseline   Discolored Tooth 1 04/26/19  No No Ongoing   Gum Inflammation 1 06/24/19  No No Ongoing   Alopecia 1 11/08/19  Yes N/A Ongoing Biotin, Omega 3  Heat Intolerance 1 12/24/19  Yes N/A Ongoing Recurred  Loss of Taste 1 04/24/20  No N/A Ongoing Intermittent   Loss of Smell 1 04/24/20  No N/A Ongoing Intermittent  Fatigue 1 04/24/20  No N/A Ongoing   Hot Flashes 2 04/24/20  Yes N/A Ongoing Grade 1 at baseline  Insomnia 1 04/24/20  No N/A Ongoing   Decreased Concentration 1 04/24/20  No N/A     [name], BSN, RN Clinical Research Nurse 06/25/2020

## 2020-06-30 ENCOUNTER — Telehealth: Payer: Self-pay | Admitting: Hematology and Oncology

## 2020-06-30 NOTE — Telephone Encounter (Signed)
Scheduled per 12/2 los. Called pt unable to leave a msg. Mailing appt letter and calendar printout

## 2020-09-01 ENCOUNTER — Other Ambulatory Visit: Payer: Self-pay | Admitting: *Deleted

## 2020-09-01 DIAGNOSIS — Z170 Estrogen receptor positive status [ER+]: Secondary | ICD-10-CM

## 2020-09-01 DIAGNOSIS — C50412 Malignant neoplasm of upper-outer quadrant of left female breast: Secondary | ICD-10-CM

## 2020-09-15 ENCOUNTER — Telehealth: Payer: Self-pay | Admitting: *Deleted

## 2020-09-15 NOTE — Progress Notes (Signed)
Patient Care Team: [name], MD as PCP - General (Family Medicine) Nicholas Lose, MD as Consulting Physician (Hematology and Oncology) Rolm Bookbinder, MD as Consulting Physician (General Surgery) Delice Bison, [name], NP as Nurse Practitioner (Hematology and Oncology) Eppie Gibson, MD as Attending Physician (Radiation Oncology)  DIAGNOSIS:    ICD-10-CM   1. Malignant neoplasm of upper-outer quadrant of left breast in female, estrogen receptor positive ([place])  C50.412    Z17.0     SUMMARY OF ONCOLOGIC HISTORY: Oncology History  Malignant neoplasm of upper-outer quadrant of left breast in female, estrogen receptor positive ([place])  01/02/2018 Initial Diagnosis   Palpable lump left breast, mammogram: Left breast UOQ 2.3 cm mass, axilla has several enlarged lymph nodes; 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   01/04/2018 Cancer Staging   Staging form: Breast, AJCC 8th Edition - Clinical: Stage IIB (cT2, cN1, cM0, G3, ER+, PR+, HER2: Equivocal) - Signed by Nicholas Lose, MD on 01/04/2018   01/16/2018 - 06/18/2018 Neo-Adjuvant Chemotherapy   Dose dense Adriamycin and Cytoxan x4 followed by Taxol, switched to Abraxane with cycle 2 after Taxol reaction with palpitations weekly x12   01/19/2018 Genetic Testing   STK11 c.608C>T VUS identified on the Multicancer panel.  The Multi-Gene Panel offered by [facility] includes sequencing and/or deletion duplication testing of the following 83 genes: ALK, APC, ATM, AXIN2,BAP1,  BARD1, BLM, BMPR1A, BRCA1, BRCA2, BRIP1, CASR, CDC73, CDH1, CDK4, CDKN1B, CDKN1C, CDKN2A (p14ARF), CDKN2A (p16INK4a), CEBPA, CHEK2, CTNNA1, DICER1, DIS3L2, EGFR (c.2369C>T, p.Thr790Met variant only), EPCAM (Deletion/duplication testing only), FH, FLCN, GATA2, GPC3, GREM1 (Promoter region deletion/duplication testing only), HOXB13 (c.251G>A, p.Gly84Glu), HRAS, KIT,  MAX, MEN1, MET, MITF (c.952G>A, p.Glu318Lys variant only), MLH1, MSH2, MSH3, MSH6, MUTYH, NBN, NF1, NF2, NTHL1, PALB2, PDGFRA, PHOX2B, PMS2, POLD1, POLE, POT1, PRKAR1A, PTCH1, PTEN, RAD50, RAD51C, RAD51D, RB1, RECQL4, RET, RUNX1, SDHAF2, SDHA (sequence changes only), SDHB, SDHC, SDHD, SMAD4, SMARCA4, SMARCB1, SMARCE1, STK11, SUFU, TERT, TERT, TMEM127, TP53, TSC1, TSC2, VHL, WRN and WT1.  The report date is January 19, 2018.   06/13/2018 Breast MRI   Significant improvement of left breast cancer previously 2.4 x 3.2 x 4 cm, currently 1.4 x 1.3 x 0.8 cm, no lymph nodes   07/02/2018 Surgery   Bilateral mastectomies: Left mastectomy: IDC grade 2, 0.9 cm, margins negative, 1/4 lymph nodes positive with extracapsular extension, margins negative ypT1b ypN1 ER 75% strong, PR 70% strong, HER-2 negative, Ki-67 60%   07/24/2018 Surgery   Left axillary lymph node dissection: 0/4 lymph nodes   09/07/2018 - 10/22/2018 Radiation Therapy   Adjuvant radiation   10/2018 -  Anti-estrogen oral therapy   Zoladex and Anastrozole   02/06/2019 - 03/06/2019 Chemotherapy   The patient had Investigational ribociclib (KISQALI) 200 MG tablet [name], 400 mg, Oral, Daily, 2 of 16 cycles Dose modification: 200 mg (original dose 400 mg, Cycle 3, Reason: Provider Judgment, Comment: Dose Reduction to 200 mg)  for chemotherapy treatment.      CHIEF COMPLIANT: Follow-up of left breast cancer on [name] clinical trial  INTERVAL HISTORY: Kristi [name] is a 45 y.o. with above-mentioned history of breast cancer treated with neoadjuvant chemotherapy,bilateral mastectomies, radiation, and who is currently on adjuvant antiestrogen therapy with  anastrozole. She is a participant in the [place] clinical trial.She presents to the clinic todayfor follow-up.  She did not report any new or worsening side effects to anastrozole.  She  does have hot flashes which are manageable on Effexor.  ALLERGIES:  is allergic to sulfa  antibiotics, chlorhexidine gluconate, and other.  MEDICATIONS:  Current Outpatient Medications  Medication Sig Dispense Refill  . anastrozole (ARIMIDEX) 1 MG tablet Take 1 tablet (1 mg total) by mouth daily. 90 tablet 3  . baclofen (LIORESAL) 10 MG tablet Take 10 mg by mouth 2 (two) times daily as needed (headaches/spasms.).     Marland Kitchen Biotin 5000 MCG CAPS Take 1 capsule by mouth daily.    . Calcium Carbonate-Vitamin D (CALCIUM 500/VITAMIN D PO) Take 1 Dose by mouth daily. Calcium 500 mg with Vitamin D3 1,000 IU    . eletriptan (RELPAX) 40 MG tablet Take 40 mg by mouth every 2 (two) hours as needed for migraine (max 2 doses per 24 hrs.).   3  . EMGALITY 120 MG/ML SOAJ Inject 120 mg into the skin every 30 (thirty) days.     . fexofenadine (ALLEGRA) 180 MG tablet Take 180 mg by mouth daily as needed for allergies (Pt alternates with Xyzal).     . gabapentin (NEURONTIN) 300 MG capsule TAKE ONE CAPSULE BY MOUTH AT BEDTIME (Patient taking differently: Take 900 mg by mouth at bedtime. ) 30 capsule 2  . Omega 3 1200 MG CAPS Take 1 each by mouth daily.    . Rimegepant Sulfate (NURTEC) 75 MG TBDP Take 75 mg by mouth as needed (migraines).    . venlafaxine XR (EFFEXOR-XR) 75 MG 24 hr capsule Take 1 capsule (75 mg total) by mouth daily with breakfast. 90 capsule 3   No current facility-administered medications for this visit.   Facility-Administered Medications Ordered in Other Visits  Medication Dose Route Frequency Provider Last Rate Last Admin  . sodium chloride flush (NS) 0.9 % injection 10 mL  10 mL Intravenous PRN Nicholas Lose, MD   10 mL at 04/13/18 1529    PHYSICAL EXAMINATION: ECOG PERFORMANCE STATUS: 1 - Symptomatic but completely ambulatory  Vitals:   09/16/20 1107  BP: 127/90  Pulse: 95  Resp: 17  Temp: 97.7 F (36.5 C)  SpO2: 100%   Filed Weights   09/16/20 1107  Weight: 154 lb 11.2 oz (70.2 kg)      LABORATORY DATA:  I have reviewed the data as listed CMP Latest Ref Rng &  Units 09/16/2020 06/25/2020 04/23/2020  Glucose 70 - 99 mg/dL 94 91 79  BUN 6 - 20 mg/dL 9 16 9   Creatinine 0.44 - 1.00 mg/dL 0.79 0.75 0.68  Sodium 135 - 145 mmol/L 138 136 138  Potassium 3.5 - 5.1 mmol/L 3.6 4.3 3.8  Chloride 98 - 111 mmol/L 102 101 101  CO2 22 - 32 mmol/L 24 24 27   Calcium 8.9 - 10.3 mg/dL 9.7 9.5 9.7  Total Protein 6.5 - 8.1 g/dL 8.7(H) 7.8 7.9  Total Bilirubin 0.3 - 1.2 mg/dL 0.8 0.4 0.8  Alkaline Phos 38 - 126 U/L 100 82 96  AST 15 - 41 U/L 32 28 20  ALT 0 - 44 U/L 54(H) 37 29    Lab Results  Component Value Date   WBC 5.9 09/16/2020   HGB 15.0 09/16/2020   HCT 45.3 09/16/2020   MCV 89.2 09/16/2020   PLT 374 09/16/2020   NEUTROABS 3.9 09/16/2020    ASSESSMENT & PLAN:  Malignant neoplasm of upper-outer quadrant of left breast in female, estrogen receptor positive ([place]) 01/02/2018: Palpable lump left breastsince December 2018, mammogram: Left breast UOQ 2.3 cm mass, axilla has several enlarged  lymph nodes; 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  Status post neoadjuvant chemotherapy with dose dense Adriamycin Cytoxan x4 followed by Abraxane weekly x12  07/02/2018:Bilateral mastectomies: Left mastectomy: IDC grade 2, 0.9 cm, margins negative, 1/4 lymph nodes positive with extracapsular extension, margins negative ypT1b ypN1 ER 75% strong, PR 10% strong, HER-2 negative, Ki-67 60% 07/23/2018: Left axillary lymph node dissection: 0/4 LN Adjuvant radiation therapy 09/07/2018-10/23/2018 ----------------------------------------------------------------------------------------------------------------------------------- [name] clinical trial: Phase 3 clinical trial in patients or estrogen receptor positive randomized between standard antiestrogen therapy versus antiestrogen therapy with ribociclib 400 mg for 36 months. Discontinued after 3 cycles because of  toxicities (nausea, metallic taste, numbness of the tongue) Adverse effects to ribociclib have resolved.  Shehadbilateral salpingo-oophorectomy on 03/06/2019. Breast reconstruction surgery 04/19/2019  Current treatment: Anastrozole Anastrozole toxicities:  1. Hot flashes much improved after Effexorcurrently at 75 mg daily.  However she still gets 4-5 hot flashes through the day.  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  Surveillance: No role of imaging studies since she had bilateral mastectomies Breast exam9/30/2021: Benign  Due to intense stress related to work she quit her previous employment and is now looking for better opportunities.  No clinical evidence of breast cancer. Return to clinic in May for follow-up   No orders of the defined types were placed in this encounter.  The patient has a good understanding of the overall plan. she agrees with it. she will call with any problems that may develop before the next visit here.  Total time spent: 20 mins including face to face time and time spent for planning, charting and coordination of care  Rulon Eisenmenger, MD, MPH 09/16/2020  I, Cloyde Reams Dorshimer, am acting as scribe for Dr. Nicholas Lose.  I have reviewed the above documentation for accuracy and completeness, and I agree with the above.

## 2020-09-15 NOTE — Telephone Encounter (Signed)
Kristi [name]; Called patient and reminded her of [name] visit tomorrow at 10:30 am. Reminded patient to fast for at least 8 hrs prior to lab appointment.  Reminded patient to bring in medication diaries and Anastrozole to take in clinic after appointment. Patient confirmed and verbalized understanding of instructions. Thanked patient for her time and look forward to seeing her tomorrow morning.   [name], BSN, RN Clinical Research Nurse 09/15/2020 3:30 PM

## 2020-09-15 NOTE — Assessment & Plan Note (Signed)
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  Status post neoadjuvant chemotherapy with dose dense Adriamycin Cytoxan x4 followed by Abraxane weekly x12  07/02/2018:Bilateral mastectomies: Left mastectomy: IDC grade 2, 0.9 cm, margins negative, 1/4 lymph nodes positive with extracapsular extension, margins negative ypT1b ypN1 ER 75% strong, PR 10% strong, HER-2 negative, Ki-67 60% 07/23/2018: Left axillary lymph node dissection: 0/4 LN Adjuvant radiation therapy 09/07/2018-10/23/2018 ----------------------------------------------------------------------------------------------------------------------------------- [name] clinical trial: Phase 3 clinical trial in patients or estrogen receptor positive randomized between standard antiestrogen therapy versus antiestrogen therapy with ribociclib 400 mg for 36 months. Discontinued after 3 cycles because of toxicities (nausea, metallic taste, numbness of the tongue) Adverse effects to ribociclib have resolved.  Shehadbilateral salpingo-oophorectomy on 03/06/2019. Breast reconstruction surgery 04/19/2019  Current treatment: Anastrozole Anastrozole toxicities:  1. Hot flashes much improved after Effexorcurrently at 75 mg daily.  However she still gets 4-5 hot flashes through the day.  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  Surveillance: No role of imaging studies since she had bilateral mastectomies Breast exam9/30/2021: Benign  No clinical evidence of breast cancer.

## 2020-09-16 ENCOUNTER — Encounter: Payer: Self-pay | Admitting: *Deleted

## 2020-09-16 ENCOUNTER — Inpatient Hospital Stay: Payer: Self-pay

## 2020-09-16 ENCOUNTER — Other Ambulatory Visit: Payer: Self-pay

## 2020-09-16 ENCOUNTER — Inpatient Hospital Stay: Payer: Self-pay | Attending: Hematology and Oncology | Admitting: Hematology and Oncology

## 2020-09-16 DIAGNOSIS — Z006 Encounter for examination for normal comparison and control in clinical research program: Secondary | ICD-10-CM

## 2020-09-16 DIAGNOSIS — C50412 Malignant neoplasm of upper-outer quadrant of left female breast: Secondary | ICD-10-CM

## 2020-09-16 DIAGNOSIS — Z170 Estrogen receptor positive status [ER+]: Secondary | ICD-10-CM

## 2020-09-16 LAB — CBC WITH DIFFERENTIAL (CANCER CENTER ONLY)
Abs Immature Granulocytes: 0.02 10*3/uL (ref 0.00–0.07)
Basophils Absolute: 0.1 10*3/uL (ref 0.0–0.1)
Basophils Relative: 1 %
Eosinophils Absolute: 0.1 10*3/uL (ref 0.0–0.5)
Eosinophils Relative: 2 %
HCT: 45.3 % (ref 36.0–46.0)
Hemoglobin: 15 g/dL (ref 12.0–15.0)
Immature Granulocytes: 0 %
Lymphocytes Relative: 26 %
Lymphs Abs: 1.5 10*3/uL (ref 0.7–4.0)
MCH: 29.5 pg (ref 26.0–34.0)
MCHC: 33.1 g/dL (ref 30.0–36.0)
MCV: 89.2 fL (ref 80.0–100.0)
Monocytes Absolute: 0.3 10*3/uL (ref 0.1–1.0)
Monocytes Relative: 5 %
Neutro Abs: 3.9 10*3/uL (ref 1.7–7.7)
Neutrophils Relative %: 66 %
Platelet Count: 374 10*3/uL (ref 150–400)
RBC: 5.08 MIL/uL (ref 3.87–5.11)
RDW: 13.3 % (ref 11.5–15.5)
WBC Count: 5.9 10*3/uL (ref 4.0–10.5)
nRBC: 0 % (ref 0.0–0.2)

## 2020-09-16 LAB — LIPASE, BLOOD: Lipase: 53 U/L — ABNORMAL HIGH (ref 11–51)

## 2020-09-16 LAB — CMP (CANCER CENTER ONLY)
ALT: 54 U/L — ABNORMAL HIGH (ref 0–44)
AST: 32 U/L (ref 15–41)
Albumin: 5 g/dL (ref 3.5–5.0)
Alkaline Phosphatase: 100 U/L (ref 38–126)
Anion gap: 12 (ref 5–15)
BUN: 9 mg/dL (ref 6–20)
CO2: 24 mmol/L (ref 22–32)
Calcium: 9.7 mg/dL (ref 8.9–10.3)
Chloride: 102 mmol/L (ref 98–111)
Creatinine: 0.79 mg/dL (ref 0.44–1.00)
GFR, Estimated: >60 mL/min (ref >60)
Glucose, Bld: 94 mg/dL (ref 70–99)
Potassium: 3.6 mmol/L (ref 3.5–5.1)
Sodium: 138 mmol/L (ref 135–145)
Total Bilirubin: 0.8 mg/dL (ref 0.3–1.2)
Total Protein: 8.7 g/dL — ABNORMAL HIGH (ref 6.5–8.1)

## 2020-09-16 LAB — BILIRUBIN, DIRECT: Bilirubin, Direct: 0.1 mg/dL (ref 0.0–0.2)

## 2020-09-16 LAB — LACTATE DEHYDROGENASE: LDH: 140 U/L (ref 98–192)

## 2020-09-16 LAB — URIC ACID: Uric Acid, Serum: 4.3 mg/dL (ref 2.5–7.1)

## 2020-09-16 LAB — AMYLASE: Amylase: 81 U/L (ref 28–100)

## 2020-09-16 LAB — PHOSPHORUS: Phosphorus: 4 mg/dL (ref 2.5–4.6)

## 2020-09-16 LAB — MAGNESIUM: Magnesium: 2.2 mg/dL (ref 1.7–2.4)

## 2020-09-16 LAB — GAMMA GT: GGT: 19 U/L (ref 7–50)

## 2020-09-16 NOTE — Research (Signed)
Cycle 22, Day 1 [name] Visit: Patient into clinic unaccompanied today to complete the study activities for cycle 22.    PROs: Given to patient prior to lab appointment.  Collected and checked for completeness and accuracy.  Labs: Fasting labs were collected per protocol. Patient confirmed she has been fasting at least 8 hours. Corrected Calcium Formula:  4 g/dL utilized for "normal albumin;" local normal range is 3.5-5 g/dL. Corrected Calcium = (0.8 x [normal albumin - patient's albumin]) + serum calcium Correct Calcium = (0.8 x [4-5]) +9.7 Corrected Calcium = 10.5 Results reviewed by Dr. Lindi Adie.  He does not consider the slightly elevated Lipase, ALT, Total Protein or Correct Calcium as clinically significant.   VS: Completed per protocol with BP obtained after patient has been resting x 5 minutes.See VS flow sheet. MD: Patient was seen by Dr. Lindi Adie. See his office encounter note from today. Concomitant Meds: Medication list reviewed with patient and updated in EMR. Patient reports starting new OTC sleep aid for insomnia. Patient denies any other new medications or discontinuing any medications since last study visit.  Adverse Events:  Loss of taste and Smell intermittent-grade 1- Patient states this resolved 2 weeks ago.  Fatigue- grade 1- Pt reports she continues to feel mild fatigue. It is not any worse since last visit.   Insomnia- grade 1- Reports insomnia is improved with taking OTC Sleep medication.  Hot Flashes- grade 2- Reports same as last visit.  Decreased concentration- grade 1- Pt reports this as ongoing but not worsening since last visit.  Hypertension-grade 2-Ongoing intermittent grade 2 HTN is considered clinically insignificant per Dr. Lindi Adie. Patient had grade 1 HTN reported prior to starting on study.  Patient reports no changes to dental issues or alopecia. She is unsure about the heat intolerance since it has not been hot outside recently.  Patient denies any other new or  worsening AEs since last study visit.   See AE table below.  Endocrine Therapy: Cycles 19, 20, and 21 medication diaries collected. Patient reports compliance with ET intake, except for one day in cycle 20 she missed a dose. Patient states she forgot to take dose that day.  Her diaries reflect this. New medication diaries for cycles 22, 23 and 24 given to patient and reminded on completing daily and returning both pages of diaries on her next visit. Patient verbalized understanding. She took Anastrozole in clinic after all study assessments were completed and documented in medication diary at 11:30 am.  Plan: Patient scheduled to return for Cycle 25 visit on 12/09/2020. Thanked patient for her ongoing participation in this study and encouraged her to call for any new questions or concerns prior to next visit. She verbalized understanding.  AEs: Event Grade Onset Date End Date Attribution to Anastrozole Attributionto Ribociclib Status Comments  Allergic Rhinitis 2 2005  No No Ongoing; baseline   Headache 1 2000  No No Ongoing; basline   Hot Flashes 1 07/26/19 04/24/20   Ongoing; baseline Decreased to grade 1 (baseline)  Memory Impairment 1 02/2019  No No Ongoing; baseline   Discolored Tooth 1 04/26/19  No No Ongoing   Gum Inflammation 1 06/24/19  No No Ongoing   Alopecia 1 11/08/19  Yes N/A Ongoing Biotin, Omega 3  Heat Intolerance 1 12/24/19  Yes N/A Ongoing Recurred  Loss of Taste 1 04/24/20 09/02/20 No N/A Resolved Intermittent  Loss of Smell 1 04/24/20 09/02/20 No N/A Resolved Intermittent  Fatigue 1 04/24/20  No N/A Ongoing  Hot Flashes 2 04/24/20  Yes N/A Ongoing Grade 1 at baseline  Insomnia 1 04/24/20  No N/A Ongoing Started OTC sleep aid 07/09/20 with improvement  Decreased Concentration 1 04/24/20  No N/A Ongoing    [name], BSN, Therapist, sports Nurse 09/16/2020

## 2020-09-17 ENCOUNTER — Telehealth: Payer: Self-pay | Admitting: Hematology and Oncology

## 2020-09-17 NOTE — Telephone Encounter (Signed)
Scheduled per 2/23 los. Called pt and left a msg

## 2020-10-29 ENCOUNTER — Other Ambulatory Visit: Payer: Self-pay | Admitting: Hematology and Oncology

## 2020-12-04 ENCOUNTER — Other Ambulatory Visit: Payer: Self-pay | Admitting: *Deleted

## 2020-12-04 DIAGNOSIS — C50412 Malignant neoplasm of upper-outer quadrant of left female breast: Secondary | ICD-10-CM

## 2020-12-04 DIAGNOSIS — Z170 Estrogen receptor positive status [ER+]: Secondary | ICD-10-CM

## 2020-12-08 ENCOUNTER — Telehealth: Payer: Self-pay | Admitting: *Deleted

## 2020-12-08 NOTE — Telephone Encounter (Signed)
[name]; LVM reminding patient of study visit tomorrow at 10:30 am. Reminded patient to fast for at least 8 hrs prior to lab appointment and to bring in medication diaries along with Anastrozole to take in clinic after appointment. Asked patient to call research nurse back if any questions or if unable to make appointment as scheduled.  [name], BSN, RN Clinical Research Nurse 12/08/2020 1:24 PM

## 2020-12-09 ENCOUNTER — Inpatient Hospital Stay: Payer: Self-pay | Attending: Hematology and Oncology | Admitting: Hematology and Oncology

## 2020-12-09 ENCOUNTER — Encounter: Payer: Self-pay | Admitting: *Deleted

## 2020-12-09 ENCOUNTER — Inpatient Hospital Stay: Payer: Self-pay

## 2020-12-09 ENCOUNTER — Other Ambulatory Visit: Payer: Self-pay

## 2020-12-09 DIAGNOSIS — C50412 Malignant neoplasm of upper-outer quadrant of left female breast: Secondary | ICD-10-CM | POA: Insufficient documentation

## 2020-12-09 DIAGNOSIS — Z79811 Long term (current) use of aromatase inhibitors: Secondary | ICD-10-CM | POA: Insufficient documentation

## 2020-12-09 DIAGNOSIS — Z006 Encounter for examination for normal comparison and control in clinical research program: Secondary | ICD-10-CM

## 2020-12-09 DIAGNOSIS — G620 Drug-induced polyneuropathy: Secondary | ICD-10-CM | POA: Insufficient documentation

## 2020-12-09 DIAGNOSIS — Z170 Estrogen receptor positive status [ER+]: Secondary | ICD-10-CM | POA: Insufficient documentation

## 2020-12-09 DIAGNOSIS — T451X5A Adverse effect of antineoplastic and immunosuppressive drugs, initial encounter: Secondary | ICD-10-CM | POA: Insufficient documentation

## 2020-12-09 DIAGNOSIS — Z90722 Acquired absence of ovaries, bilateral: Secondary | ICD-10-CM | POA: Insufficient documentation

## 2020-12-09 DIAGNOSIS — Z9013 Acquired absence of bilateral breasts and nipples: Secondary | ICD-10-CM | POA: Insufficient documentation

## 2020-12-09 DIAGNOSIS — Z9079 Acquired absence of other genital organ(s): Secondary | ICD-10-CM | POA: Insufficient documentation

## 2020-12-09 DIAGNOSIS — Z8616 Personal history of COVID-19: Secondary | ICD-10-CM | POA: Insufficient documentation

## 2020-12-09 LAB — CMP (CANCER CENTER ONLY)
ALT: 27 U/L (ref 0–44)
AST: 26 U/L (ref 15–41)
Albumin: 4.5 g/dL (ref 3.5–5.0)
Alkaline Phosphatase: 91 U/L (ref 38–126)
Anion gap: 10 (ref 5–15)
BUN: 8 mg/dL (ref 6–20)
CO2: 28 mmol/L (ref 22–32)
Calcium: 9.5 mg/dL (ref 8.9–10.3)
Chloride: 103 mmol/L (ref 98–111)
Creatinine: 0.73 mg/dL (ref 0.44–1.00)
GFR, Estimated: >60 mL/min (ref >60)
Glucose, Bld: 88 mg/dL (ref 70–99)
Potassium: 3.7 mmol/L (ref 3.5–5.1)
Sodium: 141 mmol/L (ref 135–145)
Total Bilirubin: 0.5 mg/dL (ref 0.3–1.2)
Total Protein: 7.9 g/dL (ref 6.5–8.1)

## 2020-12-09 LAB — LACTATE DEHYDROGENASE: LDH: 130 U/L (ref 98–192)

## 2020-12-09 LAB — CBC WITH DIFFERENTIAL (CANCER CENTER ONLY)
Abs Immature Granulocytes: 0.01 10*3/uL (ref 0.00–0.07)
Basophils Absolute: 0 10*3/uL (ref 0.0–0.1)
Basophils Relative: 1 %
Eosinophils Absolute: 0.2 10*3/uL (ref 0.0–0.5)
Eosinophils Relative: 4 %
HCT: 44 % (ref 36.0–46.0)
Hemoglobin: 14.4 g/dL (ref 12.0–15.0)
Immature Granulocytes: 0 %
Lymphocytes Relative: 31 %
Lymphs Abs: 1.3 10*3/uL (ref 0.7–4.0)
MCH: 29.6 pg (ref 26.0–34.0)
MCHC: 32.7 g/dL (ref 30.0–36.0)
MCV: 90.5 fL (ref 80.0–100.0)
Monocytes Absolute: 0.2 10*3/uL (ref 0.1–1.0)
Monocytes Relative: 4 %
Neutro Abs: 2.5 10*3/uL (ref 1.7–7.7)
Neutrophils Relative %: 60 %
Platelet Count: 309 10*3/uL (ref 150–400)
RBC: 4.86 MIL/uL (ref 3.87–5.11)
RDW: 12.8 % (ref 11.5–15.5)
WBC Count: 4.2 10*3/uL (ref 4.0–10.5)
nRBC: 0 % (ref 0.0–0.2)

## 2020-12-09 LAB — GAMMA GT: GGT: 16 U/L (ref 7–50)

## 2020-12-09 LAB — BILIRUBIN, DIRECT: Bilirubin, Direct: 0.1 mg/dL (ref 0.0–0.2)

## 2020-12-09 LAB — PHOSPHORUS: Phosphorus: 4.3 mg/dL (ref 2.5–4.6)

## 2020-12-09 LAB — LIPASE, BLOOD: Lipase: 51 U/L (ref 11–51)

## 2020-12-09 LAB — URIC ACID: Uric Acid, Serum: 4.7 mg/dL (ref 2.5–7.1)

## 2020-12-09 LAB — MAGNESIUM: Magnesium: 2.2 mg/dL (ref 1.7–2.4)

## 2020-12-09 LAB — RESEARCH LABS

## 2020-12-09 LAB — AMYLASE: Amylase: 70 U/L (ref 28–100)

## 2020-12-09 NOTE — Progress Notes (Signed)
Patient Care Team: Kristi Rasmussen, MD as PCP - General (Family Medicine) Kristi Lose, MD as Consulting Physician (Hematology and Oncology) Kristi Bookbinder, MD as Consulting Physician (General Surgery) Kristi Bison [name], NP as Nurse Practitioner (Hematology and Oncology) Kristi Gibson, MD as Attending Physician (Radiation Oncology)  DIAGNOSIS:  Encounter Diagnosis  Name Primary?  . Malignant neoplasm of upper-outer quadrant of left breast in female, estrogen receptor positive ([place])     SUMMARY OF ONCOLOGIC HISTORY: Oncology History  Malignant neoplasm of upper-outer quadrant of left breast in female, estrogen receptor positive ([place])  01/02/2018 Initial Diagnosis   Palpable lump left breast, mammogram: Left breast UOQ 2.3 cm mass, axilla has several enlarged lymph nodes; 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   01/04/2018 Cancer Staging   Staging form: Breast, AJCC 8th Edition - Clinical: Stage IIB (cT2, cN1, cM0, G3, ER+, PR+, HER2: Equivocal) - Signed by Kristi Lose, MD on 01/04/2018   01/16/2018 - 06/18/2018 Neo-Adjuvant Chemotherapy   Dose dense Adriamycin and Cytoxan x4 followed by Taxol, switched to Abraxane with cycle 2 after Taxol reaction with palpitations weekly x12   01/19/2018 Genetic Testing   STK11 c.608C>T VUS identified on the Multicancer panel.  The Multi-Gene Panel offered by [facility] includes sequencing and/or deletion duplication testing of the following 83 genes: ALK, APC, ATM, AXIN2,BAP1,  BARD1, BLM, BMPR1A, BRCA1, BRCA2, BRIP1, CASR, CDC73, CDH1, CDK4, CDKN1B, CDKN1C, CDKN2A (p14ARF), CDKN2A (p16INK4a), CEBPA, CHEK2, CTNNA1, DICER1, DIS3L2, EGFR (c.2369C>T, p.Thr790Met variant only), EPCAM (Deletion/duplication testing only), FH, FLCN, GATA2, GPC3, GREM1 (Promoter region deletion/duplication testing only), HOXB13 (c.251G>A, p.Gly84Glu), HRAS,  KIT, MAX, MEN1, MET, MITF (c.952G>A, p.Glu318Lys variant only), MLH1, MSH2, MSH3, MSH6, MUTYH, NBN, NF1, NF2, NTHL1, PALB2, PDGFRA, PHOX2B, PMS2, POLD1, POLE, POT1, PRKAR1A, PTCH1, PTEN, RAD50, RAD51C, RAD51D, RB1, RECQL4, RET, RUNX1, SDHAF2, SDHA (sequence changes only), SDHB, SDHC, SDHD, SMAD4, SMARCA4, SMARCB1, SMARCE1, STK11, SUFU, TERT, TERT, TMEM127, TP53, TSC1, TSC2, VHL, WRN and WT1.  The report date is January 19, 2018.   06/13/2018 Breast MRI   Significant improvement of left breast cancer previously 2.4 x 3.2 x 4 cm, currently 1.4 x 1.3 x 0.8 cm, no lymph nodes   07/02/2018 Surgery   Bilateral mastectomies: Left mastectomy: IDC grade 2, 0.9 cm, margins negative, 1/4 lymph nodes positive with extracapsular extension, margins negative ypT1b ypN1 ER 75% strong, PR 70% strong, HER-2 negative, Ki-67 60%   07/24/2018 Surgery   Left axillary lymph node dissection: 0/4 lymph nodes   09/07/2018 - 10/22/2018 Radiation Therapy   Adjuvant radiation   10/2018 -  Anti-estrogen oral therapy   Zoladex and Anastrozole   02/06/2019 - 03/06/2019 Chemotherapy   The patient had Investigational ribociclib (KISQALI) 200 MG tablet [name], 400 mg, Oral, Daily, 2 of 16 cycles Dose modification: 200 mg (original dose 400 mg, Cycle 3, Reason: Provider Judgment, Comment: Dose Reduction to 200 mg)  for chemotherapy treatment.      CHIEF COMPLIANT:   INTERVAL HISTORY: Kristi [name] is a   ALLERGIES:  is allergic to sulfa antibiotics, chlorhexidine gluconate, and other.  MEDICATIONS:  Current Outpatient Medications  Medication Sig Dispense Refill  . anastrozole (ARIMIDEX) 1 MG tablet TAKE ONE TABLET BY MOUTH ONE TIME DAILY 90 tablet 3  . Biotin 5000 MCG CAPS Take 1 capsule by mouth daily.    . Calcium Carbonate-Vitamin D (CALCIUM 500/VITAMIN D PO) Take 1 Dose  by mouth daily. Calcium 500 mg with Vitamin D3 1,000 IU    . eletriptan (RELPAX) 40 MG tablet Take 40 mg by mouth every 2 (two) hours  as needed for migraine (max 2 doses per 24 hrs.).   3  . EMGALITY 120 MG/ML SOAJ Inject 120 mg into the skin every 30 (thirty) days.     . fexofenadine (ALLEGRA) 180 MG tablet Take 180 mg by mouth daily as needed for allergies (Pt alternates with Xyzal).     . gabapentin (NEURONTIN) 300 MG capsule TAKE ONE CAPSULE BY MOUTH AT BEDTIME (Patient taking differently: Take 900 mg by mouth at bedtime. ) 30 capsule 2  . Melaton-Thean-Cham-PassF-LBalm (SLEEP PO) Take 2 each by mouth at bedtime as needed. 2 Gummies (one serving) contains Melatonin 3 mg, L-Theanine 75 mg, Chamomile extract 17 mg, Passionflower extract 17 mg, lemon balm Extract 16 mg    . Omega 3 1200 MG CAPS Take 1 each by mouth daily.    . Rimegepant Sulfate (NURTEC) 75 MG TBDP Take 75 mg by mouth as needed (migraines).    . venlafaxine XR (EFFEXOR-XR) 75 MG 24 hr capsule Take 1 capsule (75 mg total) by mouth daily with breakfast. 90 capsule 3   No current facility-administered medications for this visit.   Facility-Administered Medications Ordered in Other Visits  Medication Dose Route Frequency Provider Last Rate Last Admin  . sodium chloride flush (NS) 0.9 % injection 10 mL  10 mL Intravenous PRN Kristi Lose, MD   10 mL at 04/13/18 1529    PHYSICAL EXAMINATION: ECOG PERFORMANCE STATUS: 0 - Asymptomatic  Vitals:   12/09/20 1058  BP: 135/83  Pulse: 87  Resp: 15  Temp: 97.9 F (36.6 C)  SpO2: 100%   Filed Weights   12/09/20 1058  Weight: 156 lb 4.8 oz (70.9 kg)    BREAST: Bilateral mastectomies with reconstruction no palpable lymphadenopathy no palpable lumps or nodules around the implants.  Left breast implant slightly deformed (exam performed in the presence of a chaperone)  LABORATORY DATA:  I have reviewed the data as listed CMP Latest Ref Rng & Units 09/16/2020 06/25/2020 04/23/2020  Glucose 70 - 99 mg/dL 94 91 79  BUN 6 - 20 mg/dL 9 16 9   Creatinine 0.44 - 1.00 mg/dL 0.79 0.75 0.68  Sodium 135 - 145 mmol/L 138 136  138  Potassium 3.5 - 5.1 mmol/L 3.6 4.3 3.8  Chloride 98 - 111 mmol/L 102 101 101  CO2 22 - 32 mmol/L 24 24 27   Calcium 8.9 - 10.3 mg/dL 9.7 9.5 9.7  Total Protein 6.5 - 8.1 g/dL 8.7(H) 7.8 7.9  Total Bilirubin 0.3 - 1.2 mg/dL 0.8 0.4 0.8  Alkaline Phos 38 - 126 U/L 100 82 96  AST 15 - 41 U/L 32 28 20  ALT 0 - 44 U/L 54(H) 37 29    Lab Results  Component Value Date   WBC 4.2 12/09/2020   HGB 14.4 12/09/2020   HCT 44.0 12/09/2020   MCV 90.5 12/09/2020   PLT 309 12/09/2020   NEUTROABS 2.5 12/09/2020    ASSESSMENT & PLAN:  Malignant neoplasm of upper-outer quadrant of left breast in female, estrogen receptor positive ([place]) 01/02/2018: Palpable lump left breastsince December 2018, mammogram: Left breast UOQ 2.3 cm mass, axilla has several enlarged lymph nodes; 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  Status post neoadjuvant chemotherapy with  dose dense Adriamycin Cytoxan x4 followed by Abraxane weekly x12  07/02/2018:Bilateral mastectomies: Left mastectomy: IDC grade 2, 0.9 cm, margins negative, 1/4 lymph nodes positive with extracapsular extension, margins negative ypT1b ypN1 ER 75% strong, PR 10% strong, HER-2 negative, Ki-67 60% 07/23/2018: Left axillary lymph node dissection: 0/4 LN Adjuvant radiation therapy 09/07/2018-10/23/2018 ----------------------------------------------------------------------------------------------------------------------------------- [name] clinical trial: Phase 3 clinical trial in patients or estrogen receptor positive randomized between standard antiestrogen therapy versus antiestrogen therapy with ribociclib 400 mg for 36 months. Discontinued after 3 cycles because of toxicities (nausea, metallic taste, numbness of the tongue) Adverse effects to ribociclib have resolved.  Shehadbilateral salpingo-oophorectomy on 03/06/2019. 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  Surveillance: No role of imaging studiessince she had bilateral mastectomies Breast exam5/18/22: Benign, bilateral mastectomies with reconstruction.  Left breast implant appears to be slightly deformed and she has an appointment with plastic surgery for that. 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  The patient has a good understanding of the overall plan. she agrees with it. she will call with any problems that may develop before the next visit here. Total time spent: 30 mins including face to face time and time spent for planning, charting and co-ordination of care   Harriette Ohara, MD 12/09/20

## 2020-12-09 NOTE — Assessment & Plan Note (Signed)
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  Status post neoadjuvant chemotherapy with dose dense Adriamycin Cytoxan x4 followed by Abraxane weekly x12  07/02/2018:Bilateral mastectomies: Left mastectomy: IDC grade 2, 0.9 cm, margins negative, 1/4 lymph nodes positive with extracapsular extension, margins negative ypT1b ypN1 ER 75% strong, PR 10% strong, HER-2 negative, Ki-67 60% 07/23/2018: Left axillary lymph node dissection: 0/4 LN Adjuvant radiation therapy 09/07/2018-10/23/2018 ----------------------------------------------------------------------------------------------------------------------------------- [name] clinical trial: Phase 3 clinical trial in patients or estrogen receptor positive randomized between standard antiestrogen therapy versus antiestrogen therapy with ribociclib 400 mg for 36 months. Discontinued after 3 cycles because of toxicities (nausea, metallic taste, numbness of the tongue) Adverse effects to ribociclib have resolved.  Shehadbilateral salpingo-oophorectomy on 03/06/2019. Breast reconstruction surgery 04/19/2019  Current treatment: Anastrozole Anastrozole toxicities: 1.Hot flashes much improved after Effexorcurrently at 75 mg daily.However she still gets 4-5 hot flashes through the day.  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  Surveillance: No role of imaging studiessince she had bilateral mastectomies Breast exam5/18/22: Benign No clinical evidence of disease recurrence  Due to intense stress related to work she quit her previous employment and is now looking for better opportunities. Return to  clinic in 6 months for follow-up

## 2020-12-09 NOTE — Research (Signed)
Cycle 25, Day 1 [name] Visit: Patient into clinic unaccompanied today to complete the study activities prior to starting cycle 25.    PROs: Given to patient prior to lab appointment.  Collected and checked for completeness and accuracy.   Patient also initialed and dated a correction she had made on a previous PRO (EORTC question #50) from her last study visit 09/16/20.  One answer was clearly marked out and another one circled. Initialed and dated by patient to confirm this was her intended answer.  Labs: Fasting labs were collected per protocol. Patient confirmed she has been fasting at least 8 hours.  Corrected Calcium Formula:  4 g/dL utilized for "normal albumin;" local normal range is 3.5-5 g/dL. Corrected Calcium = (0.8 x [normal albumin - patient's albumin]) + serum calcium Correct Calcium = (0.8 x [4-4.5 ]) +9.5 Corrected Calcium = 9.1 Results reviewed by Dr. Lindi Adie.   VS: Completed per protocol with BP obtained after patient has been resting x 5 minutes.See VS flow sheet. MD: Patient was seen by Dr. Lindi Adie. See his office encounter note from today. Concomitant Meds: Medication list reviewed with patient and updated in EMR. Patient discontinued Emgality due to no insurance coverage and unable to get drug assistance from manufacturer due to not having private insurance any more. Patient cannot afford to pay for this drug out of pocket. Patient denies new medications since last visit.   Adverse Events: Previously reported AEs reviewed with patient. She reports all as ongoing without any worsening symptoms. Patient does not report any other new AEs since last study visit.   See AE table below.  Endocrine Therapy: Cycles 22, 23 and 24 medication diaries collected. Patient reports 100 % compliance with ET intake.   Her diaries reflect this. New medication diaries for cycles 25, 26 and 27 given to patient and reminded on completing daily and returning both pages of diaries on her next visit. Patient  verbalized understanding. She took Anastrozole in clinic after all study assessments were completed and documented in medication diary at 11:18 am.  Plan: Patient scheduled to return for Cycle 28 on 03/03/2021. Thanked patient for her ongoing participation in this study and encouraged her to call for any new questions or concerns prior to next visit. She verbalized understanding.  AEs: Event Grade Onset Date End Date Attribution to Anastrozole Attributionto Ribociclib Status Comments  Allergic Rhinitis 2 2005  No No Ongoing; baseline   Headache 1 2000  No No Ongoing; basline   Memory Impairment 1 02/2019  No No Ongoing; baseline   Discolored Tooth 1 04/26/19  No No Ongoing   Gum Inflammation 1 06/24/19  No No Ongoing   Alopecia 1 11/08/19  Yes N/A Ongoing Biotin, Omega 3  Heat Intolerance 1 12/24/19  Yes N/A Ongoing Recurred  Fatigue 1 04/24/20  No N/A Ongoing   Hot Flashes 2 04/24/20  Yes N/A Ongoing Grade 1 at baseline  Insomnia 1 04/24/20  No N/A Ongoing Started OTC sleep aid PRN  Decreased Concentration 1 04/24/20  No N/A Ongoing    [name], BSN, RN Clinical Research Nurse 12/09/2020

## 2020-12-10 ENCOUNTER — Telehealth: Payer: Self-pay | Admitting: Hematology and Oncology

## 2020-12-10 NOTE — Telephone Encounter (Signed)
Scheduled appt per 5/17 sch msg. Pt aware.  

## 2020-12-24 ENCOUNTER — Telehealth: Payer: Self-pay | Admitting: *Deleted

## 2020-12-24 MED ORDER — VENLAFAXINE HCL ER 75 MG PO CP24: 75.0000 mg | ORAL_CAPSULE | Freq: Every day | ORAL | 3 refills | Status: AC

## 2020-12-24 NOTE — Telephone Encounter (Signed)
Patient requests refill on Effexor for 90 day supply.  Refill request sent to Dr. Lindi Adie.  [name], BSN, RN Clinical Research Nurse 12/24/2020 9:28 AM

## 2021-01-11 ENCOUNTER — Telehealth: Payer: Self-pay | Admitting: *Deleted

## 2021-01-11 MED ORDER — GABAPENTIN 300 MG PO CAPS: 900.0000 mg | ORAL_CAPSULE | Freq: Every day | ORAL | 3 refills | Status: AC

## 2021-01-11 NOTE — Telephone Encounter (Signed)
Patient left VM requesting refill on Gabapentin. Dr. Lindi Adie said he would refill this Rx for patient when needed during her last visit. Rx refill sent to Dr. Lindi Adie to sign.  [name], BSN, RN Clinical Research Nurse 01/11/2021 10:55 AM

## 2021-02-15 ENCOUNTER — Telehealth: Payer: Self-pay | Admitting: *Deleted

## 2021-02-15 NOTE — Telephone Encounter (Signed)
Kristi [name]: Patient reports she got a new job and is moving to [place], [place] on [place] weekend. She plans to keep her next scheduled [name] visit here on 03/03/21.  Patient does not commit to continuing on active [name] when she moves to [place]. She says she will allow for the [name] to continue to collect her information and medical records but she does not want to continue on the [name] required assessments even if she can transfer to a cancer center that has this [name] open. Encouraged patient to check her insurance and the area she is moving to find an Materials engineer, so Dr. Lindi Adie can make a referral for her to continue Oncology follow up when she moves. Patient will check on that and we can talk further when she comes in for her next appointment.  Thanked patient for the information, congratulated her on her new job and pending move.  [name], BSN, RN Clinical Research Nurse 02/15/2021 12:08 PM

## 2021-02-24 ENCOUNTER — Other Ambulatory Visit: Payer: Self-pay | Admitting: *Deleted

## 2021-02-24 DIAGNOSIS — Z170 Estrogen receptor positive status [ER+]: Secondary | ICD-10-CM

## 2021-02-24 DIAGNOSIS — C50412 Malignant neoplasm of upper-outer quadrant of left female breast: Secondary | ICD-10-CM

## 2021-03-02 ENCOUNTER — Telehealth: Payer: Self-pay | Admitting: *Deleted

## 2021-03-02 NOTE — Progress Notes (Signed)
Patient Care Team: [name], MD as PCP - General (Family Medicine) Nicholas Lose, MD as Consulting Physician (Hematology and Oncology) Rolm Bookbinder, MD as Consulting Physician (General Surgery) Delice Bison, [name], NP as Nurse Practitioner (Hematology and Oncology) Eppie Gibson, MD as Attending Physician (Radiation Oncology)  DIAGNOSIS:    ICD-10-CM   1. Malignant neoplasm of upper-outer quadrant of left breast in female, estrogen receptor positive ([place])  C50.412    Z17.0       SUMMARY OF ONCOLOGIC HISTORY: Oncology History  Malignant neoplasm of upper-outer quadrant of left breast in female, estrogen receptor positive ([place])  01/02/2018 Initial Diagnosis   Palpable lump left breast, mammogram: Left breast UOQ 2.3 cm mass, axilla has several enlarged lymph nodes; 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    01/04/2018 Cancer Staging   Staging form: Breast, AJCC 8th Edition - Clinical: Stage IIB (cT2, cN1, cM0, G3, ER+, PR+, HER2: Equivocal) - Signed by Nicholas Lose, MD on 01/04/2018    01/16/2018 - 06/18/2018 Neo-Adjuvant Chemotherapy   Dose dense Adriamycin and Cytoxan x4 followed by Taxol, switched to Abraxane with cycle 2 after Taxol reaction with palpitations weekly x12    01/19/2018 Genetic Testing   STK11 c.608C>T VUS identified on the Multicancer panel.  The Multi-Gene Panel offered by [facility] includes sequencing and/or deletion duplication testing of the following 83 genes: ALK, APC, ATM, AXIN2,BAP1,  BARD1, BLM, BMPR1A, BRCA1, BRCA2, BRIP1, CASR, CDC73, CDH1, CDK4, CDKN1B, CDKN1C, CDKN2A (p14ARF), CDKN2A (p16INK4a), CEBPA, CHEK2, CTNNA1, DICER1, DIS3L2, EGFR (c.2369C>T, p.Thr790Met variant only), EPCAM (Deletion/duplication testing only), FH, FLCN, GATA2, GPC3, GREM1 (Promoter region deletion/duplication testing only), HOXB13 (c.251G>A, p.Gly84Glu),  HRAS, KIT, MAX, MEN1, MET, MITF (c.952G>A, p.Glu318Lys variant only), MLH1, MSH2, MSH3, MSH6, MUTYH, NBN, NF1, NF2, NTHL1, PALB2, PDGFRA, PHOX2B, PMS2, POLD1, POLE, POT1, PRKAR1A, PTCH1, PTEN, RAD50, RAD51C, RAD51D, RB1, RECQL4, RET, RUNX1, SDHAF2, SDHA (sequence changes only), SDHB, SDHC, SDHD, SMAD4, SMARCA4, SMARCB1, SMARCE1, STK11, SUFU, TERT, TERT, TMEM127, TP53, TSC1, TSC2, VHL, WRN and WT1.  The report date is January 19, 2018.    06/13/2018 Breast MRI   Significant improvement of left breast cancer previously 2.4 x 3.2 x 4 cm, currently 1.4 x 1.3 x 0.8 cm, no lymph nodes    07/02/2018 Surgery   Bilateral mastectomies: Left mastectomy: IDC grade 2, 0.9 cm, margins negative, 1/4 lymph nodes positive with extracapsular extension, margins negative ypT1b ypN1 ER 75% strong, PR 70% strong, HER-2 negative, Ki-67 60%    07/24/2018 Surgery   Left axillary lymph node dissection: 0/4 lymph nodes    09/07/2018 - 10/22/2018 Radiation Therapy   Adjuvant radiation    10/2018 -  Anti-estrogen oral therapy   Zoladex and Anastrozole    02/06/2019 - 03/06/2019 Chemotherapy   The patient had Investigational ribociclib (KISQALI) 200 MG tablet [name], 400 mg, Oral, Daily, 2 of 16 cycles Dose modification: 200 mg (original dose 400 mg, Cycle 3, Reason: Provider Judgment, Comment: Dose Reduction to 200 mg)   for chemotherapy treatment.       CHIEF COMPLIANT: Follow-up of left breast cancer on [name] clinical trial  INTERVAL HISTORY: Kristi [name] is a 45 y.o. with above-mentioned history of breast cancer treated with neoadjuvant chemotherapy, bilateral mastectomies, radiation, and who is currently on adjuvant antiestrogen therapy with  anastrozole. She is a participant in the [place] clinical trial. She presents to the clinic today for  follow-up.  She reports to be tolerating anastrozole extremely well.  Denies any new problems or concerns.  ALLERGIES:  is allergic to sulfa antibiotics,  chlorhexidine gluconate, and other.  MEDICATIONS:  Current Outpatient Medications  Medication Sig Dispense Refill   anastrozole (ARIMIDEX) 1 MG tablet TAKE ONE TABLET BY MOUTH ONE TIME DAILY 90 tablet 3   baclofen (LIORESAL) 10 MG tablet Take 10 mg by mouth 2 (two) times daily as needed (headaches/spasms.).      Biotin 5000 MCG CAPS Take 1 capsule by mouth daily.     Calcium Carbonate-Vitamin D (CALCIUM 500/VITAMIN D PO) Take 1 Dose by mouth daily. Calcium 500 mg with Vitamin D3 1,000 IU     eletriptan (RELPAX) 40 MG tablet Take 40 mg by mouth every 2 (two) hours as needed for migraine (max 2 doses per 24 hrs.).   3   fexofenadine (ALLEGRA) 180 MG tablet Take 180 mg by mouth daily as needed for allergies (Pt alternates with Xyzal).      gabapentin (NEURONTIN) 300 MG capsule Take 3 capsules (900 mg total) by mouth at bedtime. 270 capsule 3   Melaton-Thean-Cham-PassF-LBalm (SLEEP PO) Take 2 each by mouth at bedtime as needed. 2 Gummies (one serving) contains Melatonin 3 mg, L-Theanine 75 mg, Chamomile extract 17 mg, Passionflower extract 17 mg, lemon balm Extract 16 mg     Omega 3 1200 MG CAPS Take 1 each by mouth daily.     Rimegepant Sulfate (NURTEC) 75 MG TBDP Take 75 mg by mouth as needed (migraines).     UNABLE TO FIND Take 1 packet by mouth daily. Med Name: Plexus Active Pineapple Lemongrass.  Powered green tea and yerbe mate tea drink mix with vitamin A, C, E, Thiamin, Riboflavin, Niacin, Vitamin B6, Vitamin B12 and Panththenic Acid     UNABLE TO FIND Take 1 capsule by mouth daily. Med Name: Plexus MetaBurn. Supplement containing n-acetyl L tyrosine, green tea leaf extract, caffeine, rhodiola rosea root extract, l-theanine, grains of paradise seed extract, forskohilii root extract, astragin     UNABLE TO FIND Take 1 packet by mouth daily. Med Name: Plexus Hunger Control powdered drink supplement contains Chromium, polydextrose and plexus slim blend (green coffee bean extract, garcinia cambogia  fruit extract, alpha lipoic acid, white mulberry fruit extract     venlafaxine XR (EFFEXOR-XR) 75 MG 24 hr capsule Take 1 capsule (75 mg total) by mouth daily with breakfast. 90 capsule 3   No current facility-administered medications for this visit.   Facility-Administered Medications Ordered in Other Visits  Medication Dose Route Frequency Provider Last Rate Last Admin   sodium chloride flush (NS) 0.9 % injection 10 mL  10 mL Intravenous PRN Nicholas Lose, MD   10 mL at 04/13/18 1529    PHYSICAL EXAMINATION: ECOG PERFORMANCE STATUS: 1 - Symptomatic but completely ambulatory  Vitals:   03/03/21 0916  BP: 122/88  Pulse: 95  Resp: 15  Temp: 98.4 F (36.9 C)  SpO2: 100%   Filed Weights   03/03/21 0916  Weight: 157 lb 9.6 oz (71.5 kg)    BREAST: No palpable masses or nodules in either right or left breasts.   (exam performed in the presence of a chaperone)  LABORATORY DATA:  I have reviewed the data as listed CMP Latest Ref Rng & Units 03/03/2021 12/09/2020 09/16/2020  Glucose 70 - 99 mg/dL 92 88 94  BUN 6 - 20 mg/dL _0 Creatinine 0.44 - 1.00 mg/dL 0.74 0.73 0.79  Sodium  135 - 145 mmol/L 138 141 138  Potassium 3.5 - 5.1 mmol/L 3.9 3.7 3.6  Chloride 98 - 111 mmol/L 103 103 102  CO2 22 - 32 mmol/L _0 Calcium 8.9 - 10.3 mg/dL 9.4 9.5 9.7  Total Protein 6.5 - 8.1 g/dL 7.3 7.9 8.7(H)  Total Bilirubin 0.3 - 1.2 mg/dL 0.6 0.5 0.8  Alkaline Phos 38 - 126 U/L 92 91 100  AST 15 - 41 U/L 18 26 32  ALT 0 - 44 U/L 21 27 54(H)    Lab Results  Component Value Date   WBC 3.7 (L) 03/03/2021   HGB 13.3 03/03/2021   HCT 39.8 03/03/2021   MCV 90.0 03/03/2021   PLT 308 03/03/2021   NEUTROABS 2.3 03/03/2021    ASSESSMENT & PLAN:  Malignant neoplasm of upper-outer quadrant of left breast in female, estrogen receptor positive ([place]) 01/02/2018: Palpable lump left breast since December 2018, mammogram: Left breast UOQ 2.3 cm mass, axilla has several enlarged lymph nodes; biopsy  revealed grade 3 IDC with perineural invasion, lymph node also positive for IDC, ER 75%, PR 70%, Ki-67 60%, HER-2 IHC equivocal FISH: Neg.   Status post neoadjuvant chemotherapy with dose dense Adriamycin Cytoxan x4 followed by Abraxane weekly x12   07/02/2018:Bilateral mastectomies: Left mastectomy: IDC grade 2, 0.9 cm, margins negative, 1/4 lymph nodes positive with extracapsular extension, margins negative ypT1b ypN1 ER 75% strong, PR 10% strong, HER-2 negative, Ki-67 60% 07/23/2018: Left axillary lymph node dissection: 0/4 LN Adjuvant radiation therapy 09/07/2018-10/23/2018 ----------------------------------------------------------------------------------------------------------------------------------- [name] clinical trial: Phase 3 clinical trial in patients or estrogen receptor positive randomized between standard antiestrogen therapy versus antiestrogen therapy with ribociclib 400 mg for 36 months.  Discontinued after 3 cycles because of toxicities (nausea, metallic taste, numbness of the tongue) Adverse effects to ribociclib have resolved.   She had bilateral salpingo-oophorectomy on 03/06/2019.  Breast reconstruction surgery 04/19/2019   Current treatment: Anastrozole Anastrozole toxicities: Hot flashes mild to moderate   Chemo-induced peripheral neuropathy: Mild and stable   COVID-19 infection September 2021: Treated with monoclonal antibody therapy.   Surveillance: No role of imaging studies since she had bilateral mastectomies Breast exam 03/03/2021 benign, bilateral mastectomies with reconstruction.   No clinical evidence of disease recurrence   She is moving to [facility] to work for [employer]. I discussed with her about finding a local oncologist to take over her breast cancer therapy. We will be happy to provide documentation and medical records when she finds the oncologist.      No orders of the defined types were placed in this encounter.  The patient has a good  understanding of the overall plan. she agrees with it. she will call with any problems that may develop before the next visit here.  Total time spent: 20 mins including face to face time and time spent for planning, charting and coordination of care  Rulon Eisenmenger, MD, MPH 03/03/2021  I, Thana Ates, am acting as scribe for Dr. Nicholas Lose.  I have reviewed the above documentation for accuracy and completeness, and I agree with the above.

## 2021-03-02 NOTE — Telephone Encounter (Signed)
[name]: LVM for patient reminding her to fast for at least 8 hrs prior to lab appointment which is tomorrow morning at 8:45 am.  Also reminded to bring in her medication diaries.  Informed patient that we will be performing an EKG on her tomorrow and there will also be study questionnaires to complete.  Asked patient to return call if any questions or problems. Look forward to seeing patient tomorrow.  [name], BSN, RN Clinical Research Nurse 03/02/2021 11:16 AM

## 2021-03-02 NOTE — Assessment & Plan Note (Signed)
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  Status post neoadjuvant chemotherapy with dose dense Adriamycin Cytoxan x4 followed by Abraxane weekly x12  07/02/2018:Bilateral mastectomies: Left mastectomy: IDC grade 2, 0.9 cm, margins negative, 1/4 lymph nodes positive with extracapsular extension, margins negative ypT1b ypN1 ER 75% strong, PR 10% strong, HER-2 negative, Ki-67 60% 07/23/2018: Left axillary lymph node dissection: 0/4 LN Adjuvant radiation therapy 09/07/2018-10/23/2018 ----------------------------------------------------------------------------------------------------------------------------------- [name] clinical trial: Phase 3 clinical trial in patients or estrogen receptor positive randomized between standard antiestrogen therapy versus antiestrogen therapy with ribociclib 400 mg for 36 months. Discontinued after 3 cycles because of toxicities (nausea, metallic taste, numbness of the tongue) Adverse effects to ribociclib have resolved.  Shehadbilateral salpingo-oophorectomy on 03/06/2019. Breast reconstruction surgery 04/19/2019  Current treatment: Anastrozole Anastrozole toxicities:Hot flashes mild to moderate  Chemo-induced peripheral neuropathy:Intermittently worse in her feet, stable  COVID-19 infectionSeptember 2021: Treated with monoclonal antibody therapy.  Surveillance: No role of imaging studiessince she had bilateral mastectomies Breast exam5/18/22: Benign, bilateral mastectomies with reconstruction.  Left breast implant appears to be slightly deformed and she has an appointment with plastic surgery for that. 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

## 2021-03-03 ENCOUNTER — Inpatient Hospital Stay: Payer: Self-pay | Attending: Hematology and Oncology

## 2021-03-03 ENCOUNTER — Other Ambulatory Visit: Payer: Self-pay

## 2021-03-03 ENCOUNTER — Inpatient Hospital Stay (HOSPITAL_BASED_OUTPATIENT_CLINIC_OR_DEPARTMENT_OTHER): Payer: Self-pay | Admitting: Hematology and Oncology

## 2021-03-03 ENCOUNTER — Encounter: Payer: Self-pay | Admitting: *Deleted

## 2021-03-03 DIAGNOSIS — Z006 Encounter for examination for normal comparison and control in clinical research program: Secondary | ICD-10-CM | POA: Insufficient documentation

## 2021-03-03 DIAGNOSIS — C50412 Malignant neoplasm of upper-outer quadrant of left female breast: Secondary | ICD-10-CM

## 2021-03-03 DIAGNOSIS — Z170 Estrogen receptor positive status [ER+]: Secondary | ICD-10-CM

## 2021-03-03 LAB — CBC WITH DIFFERENTIAL (CANCER CENTER ONLY)
Abs Immature Granulocytes: 0.01 10*3/uL (ref 0.00–0.07)
Basophils Absolute: 0 10*3/uL (ref 0.0–0.1)
Basophils Relative: 1 %
Eosinophils Absolute: 0.1 10*3/uL (ref 0.0–0.5)
Eosinophils Relative: 3 %
HCT: 39.8 % (ref 36.0–46.0)
Hemoglobin: 13.3 g/dL (ref 12.0–15.0)
Immature Granulocytes: 0 %
Lymphocytes Relative: 28 %
Lymphs Abs: 1 10*3/uL (ref 0.7–4.0)
MCH: 30.1 pg (ref 26.0–34.0)
MCHC: 33.4 g/dL (ref 30.0–36.0)
MCV: 90 fL (ref 80.0–100.0)
Monocytes Absolute: 0.2 10*3/uL (ref 0.1–1.0)
Monocytes Relative: 6 %
Neutro Abs: 2.3 10*3/uL (ref 1.7–7.7)
Neutrophils Relative %: 62 %
Platelet Count: 308 10*3/uL (ref 150–400)
RBC: 4.42 MIL/uL (ref 3.87–5.11)
RDW: 13.1 % (ref 11.5–15.5)
WBC Count: 3.7 10*3/uL — ABNORMAL LOW (ref 4.0–10.5)
nRBC: 0 % (ref 0.0–0.2)

## 2021-03-03 LAB — AMYLASE: Amylase: 81 U/L (ref 28–100)

## 2021-03-03 LAB — CMP (CANCER CENTER ONLY)
ALT: 21 U/L (ref 0–44)
AST: 18 U/L (ref 15–41)
Albumin: 4.2 g/dL (ref 3.5–5.0)
Alkaline Phosphatase: 92 U/L (ref 38–126)
Anion gap: 9 (ref 5–15)
BUN: 12 mg/dL (ref 6–20)
CO2: 26 mmol/L (ref 22–32)
Calcium: 9.4 mg/dL (ref 8.9–10.3)
Chloride: 103 mmol/L (ref 98–111)
Creatinine: 0.74 mg/dL (ref 0.44–1.00)
GFR, Estimated: >60 mL/min (ref >60)
Glucose, Bld: 92 mg/dL (ref 70–99)
Potassium: 3.9 mmol/L (ref 3.5–5.1)
Sodium: 138 mmol/L (ref 135–145)
Total Bilirubin: 0.6 mg/dL (ref 0.3–1.2)
Total Protein: 7.3 g/dL (ref 6.5–8.1)

## 2021-03-03 LAB — URIC ACID: Uric Acid, Serum: 5.3 mg/dL (ref 2.5–7.1)

## 2021-03-03 LAB — MAGNESIUM: Magnesium: 2.2 mg/dL (ref 1.7–2.4)

## 2021-03-03 LAB — GAMMA GT: GGT: 13 U/L (ref 7–50)

## 2021-03-03 LAB — BILIRUBIN, DIRECT: Bilirubin, Direct: 0.1 mg/dL (ref 0.0–0.2)

## 2021-03-03 LAB — LACTATE DEHYDROGENASE: LDH: 117 U/L (ref 98–192)

## 2021-03-03 LAB — PHOSPHORUS: Phosphorus: 3.8 mg/dL (ref 2.5–4.6)

## 2021-03-03 LAB — LIPASE, BLOOD: Lipase: 56 U/L — ABNORMAL HIGH (ref 11–51)

## 2021-03-03 NOTE — Research (Signed)
End of Treatment [name] Visit: Patient into clinic unaccompanied today to complete the study activities for End of Treatment visit prior to moving to [facility] and going off the active treatment part of this study.  This appointment was originally scheduled as the cycle 28, day 1 visit and will now be used as EOT visit as patient is moving in a few weeks and does not intend to return to clinic for any further visits before her move.  Consent Addendum:   Addendum #1 to ICF v5.1  was reviewed in it's entirety with patient with particular attention paid to the side effect profile of anastrozole. Patient denied any questions or concerns and agreed to continue to be followed on study.  ICF addendum was signed and dated by patient, this nurse and Dr. Lindi Adie .  A copy of the signed consent addendum was given to patient for her records.  PROs: Given to patient prior to lab appointment.  Collected and checked for completeness and accuracy.   Labs: Fasting labs were collected per protocol. Patient confirmed she has been fasting at least 8 hours.  Corrected Calcium Formula:  4 g/dL utilized for "normal albumin;" local normal range is 3.5-5 g/dL. Corrected Calcium = (0.8 x [normal albumin - patient's albumin]) + serum calcium Correct Calcium = (0.8 x [4-4.2]) + 9.4 = 9.24 Results reviewed by Dr. Lindi Adie and he does not consider the Lipase of 56 and the WBC count 3.7 as clinically significant.  VS: Completed per protocol with BP obtained after patient has been resting x 5 minutes. See VS flow sheet. ECG: Completed per protocol and reviewed by Dr. Lindi Adie. NSR with no abnormalities noted on ECG.  H&P, ECOG: Patient was seen by Dr. Lindi Adie. See his office encounter note from today. Concomitant Meds: Medication list reviewed with patient and updated in EMR.  Patient started taking 3 new supplements since her last visit for fatigue and feels it has really helped her have more energy and her fatigue has  resolved as a result. Patient denies any other new medications since last visit.   Adverse Events: Previously reported AEs reviewed with patient. She reports fatigue and decreased concentration resolved about 3 weeks ago due to taking new supplements. All other AEs are ongoing without any worsening symptoms. Patient does not report any new AEs since last study visit.   See AE table below.  Endocrine Therapy: Cycles 25, 26 and 27 medication diaries collected. Patient reports 100% compliance with ET intake for cycles 26 and 27. She forgot to take a dose on one day during cycle 25. New medication diaries not dispensed since patient has completed this portion of the study.  Last dose of Anastrozole taken on study was yesterday 03/02/2021. Patient plans to continue to take anastrozole per Dr. Geralyn Flash instructions for total of 7 years.   Plan: Patient wants to withdraw from the treatment (In clinic portion) of the study since she in moving to [place]. Patient does not want to transfer on study to another study site in [place] as she is not sure which [place] she will transfer and how far it will be from where she will be living. Patient will be starting a new job and doesn't want to commit to any study timed visits/assessments. Patient agrees for this research site to follow her and collect medical information from her new doctors in [place]. She also agrees to phone calls from research staff and willing to complete questionnaires by phone. Patient agrees for the  study to keep her blood samples for future research as she originally consented. Patient signed and dated the Withdrawal of Treatment form for our site. Patient also signed a new ROI for this site to collect data for research purposes from her new doctor so we can update with her new address.  Patient will contact our office after she moves with information on which oncologist she is going to see there. She will also provide her new address  after she has a permanent address. She will be staying in [employer] initially short term. Patient states her cell phone number will not change and research staff can call her for any questions. Gave patient my business card with phone number and e-mail and encouraged her to call us with any questions and to update on her new Oncologist and address. We can also send medical records to her new Oncologist if needed. Thanked patient for her participation in this study and willingness to continue in Follow Up by phone.  AEs: Event Grade Onset Date End Date Attribution to Anastrozole  Attribution to Ribociclib Status Comments  Allergic Rhinitis 2 2005  No No Ongoing; baseline    Headache 1 2000   No No Ongoing; basline    Memory Impairment 1 02/2019  No No Ongoing; baseline   Hypertension, intermittent grade1-2 2 03/04/2019  No No  Ongoing on intermittent basis No medications/treatment  Discolored Tooth 1 04/26/19  No No Ongoing   Gum Inflammation 1 06/24/19  No No Ongoing   Alopecia 1 11/08/19  Yes N/A Ongoing Biotin, Omega 3  Heat Intolerance 1 12/24/19  Yes N/A Ongoing Recurred  Fatigue 1 04/24/20 02/10/21 No N/A Resolved Supplements  Hot Flashes 2 04/24/20  Yes N/A Ongoing Grade 1 at baseline  Insomnia 1 04/24/20  No N/A Ongoing Started OTC sleep aid PRN  Decreased Concentration 1 04/24/20 02/10/21 No N/A Ongoing    [name], BSN, RN Clinical Research Nurse 03/03/2021

## 2021-03-15 ENCOUNTER — Other Ambulatory Visit: Payer: Self-pay

## 2021-03-15 ENCOUNTER — Ambulatory Visit
Admission: RE | Admit: 2021-03-15 | Discharge: 2021-03-15 | Disposition: A | Discharge status: Home / Self Care | Payer: No Typology Code available for payment source | Source: Ambulatory Visit | Attending: Hematology and Oncology | Admitting: Hematology and Oncology

## 2021-03-15 DIAGNOSIS — C50412 Malignant neoplasm of upper-outer quadrant of left female breast: Secondary | ICD-10-CM

## 2021-03-15 DIAGNOSIS — Z170 Estrogen receptor positive status [ER+]: Secondary | ICD-10-CM

## 2021-05-06 ENCOUNTER — Other Ambulatory Visit: Payer: Self-pay | Admitting: *Deleted

## 2021-05-31 ENCOUNTER — Telehealth: Payer: Self-pay | Admitting: *Deleted

## 2021-05-31 NOTE — Telephone Encounter (Signed)
[name] Follow Up Call: LVM for patient requesting she call back to complete a follow up call for the study including some questionnaires by phone.  [name], BSN, RN, [employer] Clinical Research Nurse 05/31/2021 1:31 PM

## 2021-06-01 ENCOUNTER — Telehealth: Payer: Self-pay | Admitting: *Deleted
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
## 2021-10-12 ENCOUNTER — Telehealth: Payer: Self-pay | Admitting: *Deleted

## 2021-10-13 NOTE — Telephone Encounter (Signed)
Patient reports she has appointment this Friday, 10/15/21 with new Oncologist in [place].  Oncologist's name is Dr. Alla Feeling at [facility] in [place], [place], phone [phone] and [phone].  Patient requests her oncology records be faxed to them ahead of her appointment. Patient agreed to sign and return ROI that this research nurse will email to patient so we can fax the records.  ?Informed patient that next study phone call is due in April but if she needs anything or has any questions before then she should call back. Patient verbalized understanding.  ?[name], BSN, RN, CCRP ?Clinical Research Nurse II ?10/13/2021 1:36 PM ? ?

## 2021-11-04 ENCOUNTER — Encounter: Payer: Self-pay | Admitting: *Deleted

## 2021-11-04 DIAGNOSIS — Z170 Estrogen receptor positive status [ER+]: Secondary | ICD-10-CM

## 2021-11-04 NOTE — Research (Signed)
[name] Follow Up 02: ?Emailed patient to complete follow up assessments for the study.  ?PROs: Offered patient option of completing study questionnaires by phone or email. Patient completed the questionnaires that were emailed to her and then emailed them back to this study nurse. Research nurse checked for completeness and accuracy.  ?Anti-Cancer Therapy: She continues to take her anastrozole daily as prescribed. She has not started any new treatment or therapies in the past 6 months. We received the records from her new Oncologist's office in [place] from new patient visit on 10/15/21.  ?HCRU: Patient denies any serious adverse events or ER/hospital visits in the past 6 months. She did not have any extra clinic visits to see her Oncologist for any reason outside of routine new patient visit last month.  ?Plan: Research nurse will continue to follow up with her every 6 months.  We will continue to request records from her new Oncologist whenever she has a visit.  ?Encouraged her to call or email if any new questions or updates before next contact.   ?Patient verbalized understanding.  ?[name], BSN, RN, CCRP ?Clinical Research Nurse II ?11/05/2021  ? ? ?

## 2022-04-27 ENCOUNTER — Encounter: Payer: Self-pay | Admitting: *Deleted

## 2022-04-27 DIAGNOSIS — Z170 Estrogen receptor positive status [ER+]: Secondary | ICD-10-CM

## 2022-04-27 NOTE — Research (Unsigned)
[name] Follow Up 03: LVM for patient requesting call back to complete this visit by phone. Also informed her of questionnaires due and option to complete by phone or they can be e-mailed. [name], BSN, RN, [place] Nurse II [phone] 04/27/2022 1:26 PM

## 2022-05-02 ENCOUNTER — Encounter: Payer: Self-pay | Admitting: *Deleted

## 2022-05-02 DIAGNOSIS — C50412 Malignant neoplasm of upper-outer quadrant of left female breast: Secondary | ICD-10-CM

## 2022-05-02 NOTE — Research (Signed)
Kristi [name] Follow Up 03: Patient left VM for research nurse stating she was doing well and preferred to complete the questionnaires by e-mail rather than phone. Emailed patient follow up questions and copies of the questionnaires. She replied answering questions and completed the questionnaires.  PROs: Patient completed the questionnaires that were emailed to her and then emailed them back to this [name] nurse. Research nurse checked for completeness and accuracy.  Anti-Cancer Therapy: She continues to take her anastrozole daily as prescribed. She has not started any new treatment or therapies in the past 6 months. We received the records from her most recent tele-health visit with her Oncologist from 11/25/21.  HCRU: Patient denies any serious adverse events or ER/hospital visits in the past 6 months. She did not have any extra clinic visits to see her Oncologist for any reason outside of routine new patient visit last month.  Plan: Research nurse will continue to follow up with her every 6 months.  We will continue to request records from her new Oncologist whenever she has a visit.  Encouraged her to call or email if any new questions or updates before next contact.   Patient verbalized understanding.  [name], BSN, RN, [employer] Research Nurse II 05/02/2022

## 2022-06-08 ENCOUNTER — Telehealth: Payer: Self-pay

## 2022-06-08 NOTE — Telephone Encounter (Signed)
Note enter in error

## 2022-06-08 NOTE — Telephone Encounter (Deleted)
Attempted to call pt regarding signatera results lvm for pt to return call back.   

## 2022-10-14 ENCOUNTER — Encounter: Payer: Self-pay | Admitting: *Deleted

## 2022-10-14 DIAGNOSIS — C50412 Malignant neoplasm of upper-outer quadrant of left female breast: Secondary | ICD-10-CM

## 2022-10-14 NOTE — Research (Signed)
Kristi [name] Follow Up 04: Emailed patient request to complete follow up visit on [name] with copy of PROs attached on 10/12/22. Patient called this research nurse today to complete visit by phone.  PROs: Patient completed the questionnaires by phone. This research nurse read patient the questions and marked patient's answers on the questionnaires.   Anti-Cancer Therapy: She continues to take her anastrozole daily as prescribed. She has not started any new treatment or therapies in the past 6 months. She last saw her her Oncologist 07/29/2022 and states no changes.  HCRU: Patient denies any serious adverse events or ER/hospital visits in the past 6 months. She did not have any extra clinic visits to see her Oncologist for any reason outside of routine new patient visit last month.  Plan: Research nurse will continue to follow up with her every 6 months.  We will continue to request records from her new Oncologist whenever she has a visit.  Encouraged her to call or email if any new questions or updates before next contact.   Patient verbalized understanding.  [name], BSN, RN, [place] Nurse II [phone] 10/14/2022 11:54 AM

## 2022-10-25 ENCOUNTER — Other Ambulatory Visit: Payer: Self-pay | Admitting: *Deleted

## 2022-10-25 DIAGNOSIS — C50412 Malignant neoplasm of upper-outer quadrant of left female breast: Secondary | ICD-10-CM

## 2022-10-25 NOTE — Progress Notes (Signed)
Signatera renewal orders placed.

## 2022-10-31 ENCOUNTER — Other Ambulatory Visit: Payer: Self-pay | Admitting: *Deleted

## 2022-10-31 NOTE — Progress Notes (Signed)
Pt has moved to [facility] and [facility] labs needing to be canceled.

## 2023-03-29 ENCOUNTER — Encounter: Payer: Self-pay | Admitting: *Deleted

## 2023-03-29 DIAGNOSIS — Z170 Estrogen receptor positive status [ER+]: Secondary | ICD-10-CM

## 2023-03-29 NOTE — Research (Signed)
[name] Follow Up 05: Data was collected for this follow up visit by record review and e-mail correspondence with patient 03/26/23 through 03/28/23.    PROs: Patient completed the questionnaires by e-mail. This research nurse checked for completeness and accuracy and confirmed with patient she completed them on 03/26/2023. Marland Kitchen   Anti-Cancer Therapy: Patient reports she stopped anastrozole due to hot flashes and switched to exemestane 25 mg July 14-22, 2024.  The exemestane made her nauseous so she switched back to anastrozole. She has not started any other new treatment or therapies in the past 6 months. Oncology visit notes on 02/03/23 obtained.  HCRU: Patient denies any serious adverse events or ER/hospital visits in the past 6 months. She did not have any extra clinic visits to see her Oncologist for any reason outside of routine new patient visit last month.  Plan: Research nurse will continue to follow up with her every 6 months.  Encouraged her to call or email if any new questions or updates before next contact.   Domenica Reamer, BSN, RN, [employer] Research Nurse II [phone] 03/29/2023 10:57 AM

## 2023-04-03 ENCOUNTER — Telehealth: Payer: Self-pay | Admitting: Hematology and Oncology

## 2023-04-03 NOTE — Telephone Encounter (Signed)
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 Patient took Ribociclib for approximately 2 months. The clinical trial sent as information that 1 and 100,000 people can develop a cancer if nitrosamines was taken daily for 70 years. I relayed this information to the patient and explained it in great detail. She understands this and is not very concerned about her exposure.

## 2023-09-18 ENCOUNTER — Telehealth: Payer: Self-pay | Admitting: *Deleted

## 2023-09-18 NOTE — Telephone Encounter (Signed)
 [name] Follow Up 06: Data was collected for this follow up visit by e-mail correspondence with patient 09/14/23-09/16/23 and phone call today.   Re-consent:   Patient Kristi [name] [facility] currently participates in the above listed study.  This Clinical Research Nurse spoke with Kristi [name], [id number], on the phone in a manner and location that ensures patient privacy to discuss reconsent.  A copy of the informed consent document Addendum #2 to PICF v5.1 was previously emailed to the patient. Patient reads, speaks, and understands [place]. Patient confirmed she read the consent addendum and signed it on 09/16/23 prior to completing any research activities. Consent amendments and changes were also reviewed by this clinical research Nurse. Kristi [name] was provided an opportunity to ask questions and all questions were answered to patient's satisfaction.  The patient's mental and emotional status is appropriate to provide informed consent, and the patient verbalizes an interest in continuing voluntary study participation.  Patient has voluntarily given verbal re consent at 3:10 pm in addition to the signed consent on 09/16/23.  Verbal consent was also witnessed over the phone by research RN, Kristi [name]. Approximately 5 minutes were spent with the patient reviewing the informed consent addendum.   PROs: Patient completed the questionnaires by e-mail. This research nurse checked for completeness and accuracy and confirmed with patient she completed them on 09/16/23 after signing consent.  After phone visit, this research noted that patient did miss answering 2 questions on the HADs questionnaire, which patient promptly answered by email.  Anti-Cancer Therapy: Patient reports she continues to take anastrozole. She has not started any other new treatment or therapies in the past 6 months.  HCRU: Patient denies any serious adverse events or ER/hospital visits in the past 6  months. She did not have any extra clinic visits to see her Oncologist for any reason outside of routine new patient visit last month.  Plan: Research nurse will continue to follow up with her every 6 months.  Encouraged her to call or email if any new questions or updates before next contact.    Kristi [name], BSN, RN, [employer] Research Nurse II [phone] 09/18/2023 3:36 PM

## 2023-09-18 NOTE — Telephone Encounter (Signed)
 [name]  Witness of reconsent:   RN Scientist, research (medical) verified verbal reconsent via telephone for this pt with Janit Pagan. Pt stated she was agreeable to continue on study under the new amendment and updated informed consent.   Zerita Boers BSN RN Clinical Research Nurse [facility] Direct Dial: [phone] 09/18/2023  3:49 PM

## 2024-02-20 ENCOUNTER — Encounter: Payer: Self-pay | Admitting: *Deleted

## 2024-02-20 NOTE — Progress Notes (Signed)
 [name] Phase III multi-center, randomized, open-label trial to evaluate efficacy and safety of ribociclib  with endocrine therapy as adjuvant treatment in patients with HR+/HER2- Early Breast Cancer (EBC).  Follow Up #7 02/19/24- Emailed patient request to complete PROs and answer questions for follow up visit on the above trial. Patient replied with completed PROs and questions answered same day. Patient states she continues to take anastrozole  daily and she has not started any new anti-cancer treatments. Patient says she sees Oncologist in August and will discuss stopping the anastrozole .  She reports no serious adverse events to study related treatment or procedures. She did report an allergic reaction to a new sleep medication which caused her to pass out and fall. This required an ED visit and stitches. She says she had a broken nose, ribs and needed stitches in her mouth. She denies any hospitalizations or visits to Oncologist since last follow up.  02/20/24- Replied to patient thanking her for her continued participation in this trial.  Cherylyn Hoard, BSN, RN, [employer] Clinical Research Nurse II [phone] 02/20/2024 3:21 PM

## 2024-08-05 ENCOUNTER — Telehealth: Payer: Self-pay | Admitting: *Deleted

## 2024-08-05 NOTE — Telephone Encounter (Signed)
 [name] Trial - Re-consent Phone Call Consent Addendum #3 to ICF v5.1:  Patient Kristi [name] [facility] currently participates in the above listed study.  This Clinical Research Nurse spoke with [name], [id number], on the phone to discuss reconsent.  A copy of the informed consent document was emailed to patient last week on 08/02/24.  2 copies of the informed consent addendum were also mailed to patient's home address in [place]  last week with a pre-paid addressed return envelope. Patient reads, speaks, and understands English.    Consent amendments and changes were presented by this clinical research Nurse. [name] was provided an opportunity to ask questions and all questions were answered to patient's satisfaction.  The updated informed consent addendum was reviewed page by page.  The patient's mental and emotional status is appropriate to provide informed consent, and the patient verbalizes an interest in continuing voluntary study participation.  Patient states she will sign the consent addendum once she receives it in the mail but she is currently in [place]  visiting relatives and plans to stay another month or two. She agreed for another copy of the consent addendums with prepaid envelope to be mailed to her temporary address in [place] , which is [address] [place], [place], [address]. Patient states she will have difficulty signing the consent since she recently broke her right arm and sprained her left wrist in an ice skating accident. Patient states she can attempt to sign the best she can but it will likely not be legible. Informed patient it is okay to sign it to the best of her current physical ability and then mail back to the research department. We will then sign our part and mail back a fully signed copy for her records.  Informed patient the next study follow up will be due one year from her last follow up which is in August later this year. Informed  patient questionnaires are no longer required for her follow up visits. Approximately 10 minutes were spent with the patient reviewing the informed consent documents.    Patient has direct contact information of this Nurse and is encouraged to call with any questions. Thanked patient for her time and ongoing voluntary participation in the above trial.  Cherylyn Hoard, BSN, RN, [employer] Research Nurse II [phone] 08/05/2024 11:28 AM
